# Patient Record
Sex: Male | Born: 1950
Health system: Southern US, Community
[De-identification: ages and names within clinical notes are randomized; demographics above are authoritative.]

## PROBLEM LIST (undated history)

## (undated) DIAGNOSIS — K219 Gastro-esophageal reflux disease without esophagitis: Secondary | ICD-10-CM

## (undated) DIAGNOSIS — Z973 Presence of spectacles and contact lenses: Secondary | ICD-10-CM

## (undated) DIAGNOSIS — I10 Essential (primary) hypertension: Secondary | ICD-10-CM

## (undated) DIAGNOSIS — Z460 Encounter for fitting and adjustment of spectacles and contact lenses: Secondary | ICD-10-CM

## (undated) DIAGNOSIS — E785 Hyperlipidemia, unspecified: Secondary | ICD-10-CM

## (undated) DIAGNOSIS — Z8719 Personal history of other diseases of the digestive system: Secondary | ICD-10-CM

## (undated) DIAGNOSIS — E669 Obesity, unspecified: Secondary | ICD-10-CM

## (undated) DIAGNOSIS — I471 Supraventricular tachycardia, unspecified: Secondary | ICD-10-CM

## (undated) DIAGNOSIS — C8190 Hodgkin lymphoma, unspecified, unspecified site: Secondary | ICD-10-CM

## (undated) DIAGNOSIS — I499 Cardiac arrhythmia, unspecified: Secondary | ICD-10-CM

## (undated) DIAGNOSIS — J189 Pneumonia, unspecified organism: Secondary | ICD-10-CM

## (undated) DIAGNOSIS — M1990 Unspecified osteoarthritis, unspecified site: Secondary | ICD-10-CM

## (undated) DIAGNOSIS — Z9221 Personal history of antineoplastic chemotherapy: Secondary | ICD-10-CM

## (undated) HISTORY — DX: Personal history of antineoplastic chemotherapy: Z92.21

## (undated) HISTORY — DX: Essential (primary) hypertension: I10

## (undated) HISTORY — PX: WISDOM TOOTH EXTRACTION: SHX21

## (undated) HISTORY — DX: Hyperlipidemia, unspecified: E78.5

## (undated) HISTORY — PX: PORT-A-CATH REMOVAL: SHX5289

## (undated) HISTORY — DX: Encounter for fitting and adjustment of spectacles and contact lenses: Z46.0

## (undated) HISTORY — PX: CARDIAC CATHETERIZATION: SHX172

## (undated) HISTORY — DX: Presence of spectacles and contact lenses: Z97.3

## (undated) HISTORY — DX: Obesity, unspecified: E66.9

## (undated) HISTORY — DX: Gastro-esophageal reflux disease without esophagitis: K21.9

---

## 2004-01-16 DIAGNOSIS — I10 Essential (primary) hypertension: Secondary | ICD-10-CM

## 2004-01-16 HISTORY — DX: Essential (primary) hypertension: I10

## 2014-03-08 ENCOUNTER — Ambulatory Visit (INDEPENDENT_AMBULATORY_CARE_PROVIDER_SITE_OTHER): Payer: Self-pay | Admitting: Medical

## 2014-03-08 ENCOUNTER — Encounter: Payer: Self-pay | Admitting: Medical

## 2014-03-08 VITALS — BP 150/80 | HR 67 | Temp 98.0°F | Resp 14 | Ht 71.0 in | Wt 263.0 lb

## 2014-03-08 DIAGNOSIS — E669 Obesity, unspecified: Secondary | ICD-10-CM

## 2014-03-08 DIAGNOSIS — I10 Essential (primary) hypertension: Secondary | ICD-10-CM | POA: Insufficient documentation

## 2014-03-08 MED ORDER — VERAPAMIL HCL 180 MG (CO) PO TB24: 180.0000 mg | ORAL_TABLET | Freq: Every day | ORAL | Status: DC

## 2014-03-08 MED ORDER — ENALAPRIL MALEATE 20 MG PO TABS: 20.0000 mg | ORAL_TABLET | Freq: Every day | ORAL | Status: DC

## 2014-03-08 NOTE — Progress Notes (Signed)
  Subjective:  Bradley Novak is a 64 y.o. male who presents as a new patient to establish care.   I recently started seeing his wife as well as they had difficulty finding a doctor who would accept them having no insurance.  Hx/o HTN x 10+ years, been on current medications enalapril and verapamil for 10 years.  Last stress test 20+ years ago.   Overall been in usual state of health, he states that he is active, dabbles in a lot of different things, physically active, denies any current problems no chest pain no swelling no shortness of breath no fatigue. He uses no diet discretion other than avoiding salt.  No other aggravating or relieving factors.    No other c/o.  The following portions of the patient's history were reviewed and updated as appropriate: allergies, current medications, past family history, past medical history, past social history, past surgical history and problem list.  ROS Otherwise as in subjective above  Objective: Physical Exam BP 150/80 mmHg  Pulse 67  Temp(Src) 98 F (36.7 C) (Oral)  Resp 14  Ht 5\' 11"  (1.803 m)  Wt 263 lb (119.296 kg)  BMI 36.70 kg/m2  General appearance: alert, no distress, WD/WN Neck: supple, no lymphadenopathy, no thyromegaly, no masses Heart: RRR, normal S1, S2, no murmurs Lungs: CTA bilaterally, no wheezes, rhonchi, or rales Abdomen: +bs, soft, non tender, non distended, no masses, no hepatomegaly, no splenomegaly Pulses: 2+ radial pulses, 2+ pedal pulses, normal cap refill Ext: no edema   Assessment: Encounter Diagnoses  Name Primary?  . Essential hypertension Yes  . Obesity     Plan: HTN, obesity - c/t current medications, strongly encouraged him to lose weight, watch diet carefully.  currently has not been using diet discretion.  C/t getting physical activity.  Refilled medications.  Follow up: 25mo, fasting

## 2014-06-08 ENCOUNTER — Other Ambulatory Visit: Payer: Self-pay | Admitting: Medical

## 2014-07-07 ENCOUNTER — Ambulatory Visit: Payer: Self-pay | Admitting: Medical

## 2014-09-13 ENCOUNTER — Other Ambulatory Visit: Payer: Self-pay | Admitting: Medical

## 2014-09-27 ENCOUNTER — Encounter: Payer: Self-pay | Admitting: Medical

## 2014-09-27 ENCOUNTER — Ambulatory Visit (INDEPENDENT_AMBULATORY_CARE_PROVIDER_SITE_OTHER): Payer: Self-pay | Admitting: Medical

## 2014-09-27 VITALS — BP 152/88 | HR 58 | Temp 98.2°F | Resp 18 | Wt 260.2 lb

## 2014-09-27 DIAGNOSIS — E669 Obesity, unspecified: Secondary | ICD-10-CM

## 2014-09-27 DIAGNOSIS — I10 Essential (primary) hypertension: Secondary | ICD-10-CM

## 2014-09-27 LAB — COMPREHENSIVE METABOLIC PANEL
ALK PHOS: 56 U/L (ref 40–115)
ALT: 14 U/L (ref 9–46)
AST: 13 U/L (ref 10–35)
Albumin: 3.9 g/dL (ref 3.6–5.1)
BUN: 18 mg/dL (ref 7–25)
CHLORIDE: 103 mmol/L (ref 98–110)
CO2: 25 mmol/L (ref 20–31)
Calcium: 9.1 mg/dL (ref 8.6–10.3)
Creat: 0.83 mg/dL (ref 0.70–1.25)
GLUCOSE: 91 mg/dL (ref 65–99)
POTASSIUM: 4 mmol/L (ref 3.5–5.3)
Sodium: 137 mmol/L (ref 135–146)
Total Bilirubin: 0.5 mg/dL (ref 0.2–1.2)
Total Protein: 6.8 g/dL (ref 6.1–8.1)

## 2014-09-27 LAB — CBC
HEMATOCRIT: 41.4 % (ref 39.0–52.0)
HEMOGLOBIN: 13.9 g/dL (ref 13.0–17.0)
MCH: 28.8 pg (ref 26.0–34.0)
MCHC: 33.6 g/dL (ref 30.0–36.0)
MCV: 85.9 fL (ref 78.0–100.0)
MPV: 10.5 fL (ref 8.6–12.4)
Platelets: 242 10*3/uL (ref 150–400)
RBC: 4.82 MIL/uL (ref 4.22–5.81)
RDW: 13.7 % (ref 11.5–15.5)
WBC: 8.1 10*3/uL (ref 4.0–10.5)

## 2014-09-27 LAB — LIPID PANEL
Cholesterol: 172 mg/dL (ref 125–200)
HDL: 34 mg/dL — ABNORMAL LOW (ref >=40)
LDL CALC: 101 mg/dL (ref <130)
Total CHOL/HDL Ratio: 5.1 Ratio — ABNORMAL HIGH (ref <=5.0)
Triglycerides: 187 mg/dL — ABNORMAL HIGH (ref <150)
VLDL: 37 mg/dL — ABNORMAL HIGH (ref <30)

## 2014-09-27 NOTE — Progress Notes (Signed)
  Subjective: Chief Complaint  Patient presents with  . Follow-up    Bradley Novak is a 64 y.o. male who presents for med check.  Last and first visit to establish here was 02/2014.   I see his wife as a patient as well.  Hypertension - Here for follow-up of hypertension.  He is exercising with daily activity around the house,   He is adherent to a low-salt diet.  Blood pressure is well controlled at home.  SBP usually ranges 130s.   DBP usually ranges 80s.  Cardiac symptoms: none. Patient denies: chest pain, claudication, dyspnea, exertional chest pressure/discomfort, fatigue and lower extremity edema. Cardiovascular risk factors: advanced age (older than 73 for men, 62 for women), hypertension, male gender and obesity (BMI >= 30 kg/m2). Use of agents associated with hypertension: none. History of target organ damage: none.  No other c/o.  The following portions of the patient's history were reviewed and updated as appropriate: allergies, current medications, past family history, past medical history, past social history, past surgical history and problem list.  ROS Otherwise as in subjective above   Objective: BP 152/88 mmHg  Pulse 58  Temp(Src) 98.2 F (36.8 C) (Oral)  Resp 18  Wt 260 lb 3.2 oz (118.026 kg)  Gen: wd, wn, obese white male, nad Neck : supple,nontender, no mass, no thyromegaly Heart RRR, normal s1, s2, no murmurs Lungs clear Ext no edema Pulses normal Neuro: nonfocal exam    Assessment: Encounter Diagnoses  Name Primary?  . Essential hypertension Yes  . Obesity      Plan: Discussed need for healthy diet, routine exercise, weight loss, and we will make medication changes once labs are back.  BP not at goal.  Bradley Novak was seen today for follow-up.  Diagnoses and all orders for this visit:  Essential hypertension -     Comprehensive metabolic panel -     CBC -     Lipid panel -     Hemoglobin A1c  Obesity -     Comprehensive metabolic panel -      CBC -     Lipid panel -     Hemoglobin A1c

## 2014-09-28 ENCOUNTER — Other Ambulatory Visit: Payer: Self-pay | Admitting: Medical

## 2014-09-28 LAB — HEMOGLOBIN A1C
HEMOGLOBIN A1C: 5.9 % — AB (ref <5.7)
MEAN PLASMA GLUCOSE: 123 mg/dL — AB (ref <117)

## 2014-09-28 MED ORDER — HYDROCHLOROTHIAZIDE 25 MG PO TABS: 25.0000 mg | ORAL_TABLET | Freq: Every day | ORAL | Status: DC

## 2014-09-28 MED ORDER — ATORVASTATIN CALCIUM 20 MG PO TABS: 20.0000 mg | ORAL_TABLET | Freq: Every day | ORAL | Status: DC

## 2014-09-28 MED ORDER — VERAPAMIL HCL 180 MG (CO) PO TB24: 180.0000 mg | ORAL_TABLET | Freq: Every day | ORAL | Status: DC

## 2015-02-14 ENCOUNTER — Telehealth: Payer: Self-pay

## 2015-02-14 MED ORDER — VERAPAMIL HCL ER 180 MG PO TBCR: 180.0000 mg | EXTENDED_RELEASE_TABLET | Freq: Every day | ORAL | Status: DC

## 2015-02-14 MED ORDER — HYDROCHLOROTHIAZIDE 25 MG PO TABS: 25.0000 mg | ORAL_TABLET | Freq: Every day | ORAL | Status: DC

## 2015-02-14 MED ORDER — ATORVASTATIN CALCIUM 20 MG PO TABS: 20.0000 mg | ORAL_TABLET | Freq: Every day | ORAL | Status: DC

## 2015-02-14 NOTE — Telephone Encounter (Signed)
Refilled for 90 days per wifes pt message but pt is aware that he needs a f/up appt.

## 2015-04-04 ENCOUNTER — Telehealth: Payer: Self-pay | Admitting: Medical

## 2015-04-04 MED ORDER — ATORVASTATIN CALCIUM 20 MG PO TABS: 20.0000 mg | ORAL_TABLET | Freq: Every day | ORAL | Status: DC

## 2015-04-04 MED ORDER — HYDROCHLOROTHIAZIDE 25 MG PO TABS: 25.0000 mg | ORAL_TABLET | Freq: Every day | ORAL | Status: DC

## 2015-04-04 NOTE — Telephone Encounter (Signed)
Refilled and left message for pt to schedule appt

## 2015-04-04 NOTE — Telephone Encounter (Signed)
Rcvd refill request for Atorvastatin 20mg  #90 and Hydochlorothiazide 25mg  #90

## 2015-08-08 ENCOUNTER — Telehealth: Payer: Self-pay

## 2015-08-08 NOTE — Telephone Encounter (Signed)
Called pt to set up appt and had to leave message. No refills until he has an appt

## 2015-08-08 NOTE — Telephone Encounter (Signed)
Pt needs refills of HCTZ, verapamil 180mg  and atorvastatin sent to [facility] for 90 day supply please. Victorino December

## 2015-12-16 ENCOUNTER — Other Ambulatory Visit: Payer: Self-pay | Admitting: Medical

## 2015-12-19 ENCOUNTER — Other Ambulatory Visit: Payer: Self-pay | Admitting: Medical

## 2015-12-19 ENCOUNTER — Telehealth: Payer: Self-pay | Admitting: Medical

## 2015-12-19 NOTE — Telephone Encounter (Signed)
Refill denied. Needs OV for further refills.  Attempted to call pt- LM that he needs appt.  Also mailed letter. Victorino December

## 2015-12-19 NOTE — Telephone Encounter (Signed)
Rcvd refill request from [facility]  For Verapamil HCL ER 180mg  #90

## 2016-01-20 ENCOUNTER — Encounter: Payer: Self-pay | Admitting: Medical

## 2016-01-20 ENCOUNTER — Other Ambulatory Visit: Payer: Self-pay | Admitting: Medical

## 2016-01-20 ENCOUNTER — Ambulatory Visit (INDEPENDENT_AMBULATORY_CARE_PROVIDER_SITE_OTHER): Payer: Self-pay | Admitting: Medical

## 2016-01-20 VITALS — BP 200/110 | HR 53 | Wt 246.0 lb

## 2016-01-20 DIAGNOSIS — I10 Essential (primary) hypertension: Secondary | ICD-10-CM

## 2016-01-20 DIAGNOSIS — E785 Hyperlipidemia, unspecified: Secondary | ICD-10-CM

## 2016-01-20 MED ORDER — ATORVASTATIN CALCIUM 20 MG PO TABS: ORAL_TABLET | ORAL | 0 refills | Status: DC

## 2016-01-20 NOTE — Progress Notes (Signed)
Subjective: Chief Complaint  Patient presents with  . med check    med check, no other concerns    Here for med check.  Been busy, so in last month or so hasn't been taking medications.  Ran out of medications a  Month ago.  Last visit 09/2014.  He is self pay, no insurance.  He reports that he is only using the verapamil and enalapril prior to a month ago.  Never really used HCTZ since last visit.   He was taking the statin then quit for a period of time.  Uses no particular diet discretion.  Gets some exercise with walking.  Overall, no chest pain, SOB, dizziness, edema, polyuria, polydipsia.  No other aggravating or relieving factors. No other complaint.  Past Medical History:  Diagnosis Date  . Hypertension 2006  . Obesity   . Wears glasses    Current Outpatient Prescriptions on File Prior to Visit  Medication Sig Dispense Refill  . enalapril (VASOTEC) 20 MG tablet Take 1 tablet (20 mg total) by mouth daily. 90 tablet 1  . hydrochlorothiazide (HYDRODIURIL) 25 MG tablet Take 1 tablet (25 mg total) by mouth daily. 90 tablet 0  . verapamil (CALAN-SR) 180 MG CR tablet Take 1 tablet (180 mg total) by mouth at bedtime. 90 tablet 0   No current facility-administered medications on file prior to visit.    Family History  Problem Relation Age of Onset  . Hypertension Father   . Hypertension Brother   . Hypertension Brother   . Hypertension Brother   . Hypertension Brother    ROS as in subjective    Objective: BP (!) 200/110   Pulse (!) 53   Wt 246 lb (111.6 kg)   SpO2 97%   BMI 34.31 kg/m   BP Readings from Last 3 Encounters:  01/20/16 (!) 200/110  09/27/14 (!) 152/88  03/08/14 (!) 150/80   Wt Readings from Last 3 Encounters:  01/20/16 246 lb (111.6 kg)  09/27/14 260 lb 3.2 oz (118 kg)  03/08/14 263 lb (119.3 kg)   General appearance: alert, no distress, WD/WN, obese white male Neck: supple, no lymphadenopathy, no thyromegaly, no masses Heart: RRR, normal S1, S2, no  murmurs Lungs: CTA bilaterally, no wheezes, rhonchi, or rales Abdomen: +bs, soft, non tender, non distended, no masses, no hepatomegaly, no splenomegaly Pulses: 2+ symmetric, upper and lower extremities, normal cap refill Ext: no edema    Assessment: Encounter Diagnoses  Name Primary?  . Essential hypertension Yes  . Hyperlipidemia, unspecified hyperlipidemia type   . Morbid obesity ([place])      Plan: HTN - there is a discrepancy.  My personal readings were 200/110 each arm.  My CMA got normal readings.  He swears he is getting normal home readings but also notes his cuff varies widely from each time he checks.  He is not convinced he has hypertension.   I advised he take the next 2 weeks to get many readings both with his cuff and cuff at pharmacy, log the results and get me readings within  2 weeks.  We can then decide on next steps.  I will be shocked if we don't get high readings.   I have good reason to believe he has hypertension and his whole family has hypertension, so he has risk factors including age, family hx/o , obesity, no diet discretion.   advised healthy diet, routine exercise, weight loss.  F/u 2wk  Hyperlipemia - discussed reasoning for statin to lower risk  of heart attack, stroke.    obesity - c/t efforts at weight loss  He declines EKG today.  We will plan for recheck and labs in 6wk, but he will get me BP readings in 2 weeks.   Bradley Novak was seen today for med check.  Diagnoses and all orders for this visit:  Essential hypertension  Hyperlipidemia, unspecified hyperlipidemia type  Morbid obesity ([place])  Other orders -     atorvastatin (LIPITOR) 20 MG tablet; TAKE 1 TABLET (20MG  TOTAL) BY MOUTH DAILY

## 2016-01-23 MED ORDER — ATORVASTATIN CALCIUM 20 MG PO TABS: ORAL_TABLET | ORAL | 0 refills | Status: DC

## 2016-01-23 MED ORDER — VERAPAMIL HCL ER 180 MG PO TBCR: 180.0000 mg | EXTENDED_RELEASE_TABLET | Freq: Every day | ORAL | 0 refills | Status: DC

## 2016-01-23 NOTE — Telephone Encounter (Signed)
done

## 2016-01-23 NOTE — Addendum Note (Signed)
Addended by: Minette Headland A on: 01/23/2016 08:32 AM   Modules accepted: Orders

## 2016-01-24 ENCOUNTER — Other Ambulatory Visit: Payer: Self-pay | Admitting: Medical

## 2016-01-24 MED ORDER — ENALAPRIL MALEATE 20 MG PO TABS: 20.0000 mg | ORAL_TABLET | Freq: Every day | ORAL | 0 refills | Status: DC

## 2016-01-24 MED ORDER — VERAPAMIL HCL ER 180 MG PO TBCR: 180.0000 mg | EXTENDED_RELEASE_TABLET | Freq: Every day | ORAL | 3 refills | Status: DC

## 2016-01-24 MED ORDER — ATORVASTATIN CALCIUM 20 MG PO TABS: ORAL_TABLET | ORAL | 3 refills | Status: DC

## 2016-02-02 ENCOUNTER — Other Ambulatory Visit: Payer: Self-pay | Admitting: Medical

## 2016-02-02 MED ORDER — VERAPAMIL HCL ER 180 MG PO TBCR: EXTENDED_RELEASE_TABLET | ORAL | 1 refills | Status: DC

## 2016-04-29 ENCOUNTER — Encounter: Payer: Self-pay | Admitting: Medical

## 2016-04-30 ENCOUNTER — Other Ambulatory Visit: Payer: Self-pay | Admitting: Medical

## 2016-04-30 MED ORDER — VERAPAMIL HCL ER 180 MG PO TBCR: EXTENDED_RELEASE_TABLET | ORAL | 1 refills | Status: DC

## 2016-04-30 MED ORDER — ENALAPRIL MALEATE 20 MG PO TABS: 20.0000 mg | ORAL_TABLET | Freq: Every day | ORAL | 1 refills | Status: DC

## 2016-09-20 ENCOUNTER — Other Ambulatory Visit: Payer: Self-pay | Admitting: Medical

## 2016-09-20 NOTE — Telephone Encounter (Signed)
Called and l/m for pt call us back to make an appt for refill on meds.

## 2016-09-24 ENCOUNTER — Telehealth: Payer: Self-pay | Admitting: Family Medicine

## 2016-09-24 ENCOUNTER — Other Ambulatory Visit: Payer: Self-pay | Admitting: Medical

## 2016-09-24 MED ORDER — VERAPAMIL HCL ER 180 MG PO TBCR: EXTENDED_RELEASE_TABLET | ORAL | 0 refills | Status: DC

## 2016-09-24 NOTE — Telephone Encounter (Signed)
Called both numbers, one was not able to leave a voice mail the other one just rang and rang

## 2016-09-24 NOTE — Telephone Encounter (Signed)
Try again tomorrow

## 2016-09-24 NOTE — Telephone Encounter (Signed)
[facility] req Verapamil HCL ER 180 mg  180 tab

## 2016-09-24 NOTE — Telephone Encounter (Signed)
I sent med.  Get him in for fasting f/u.

## 2016-09-25 NOTE — Telephone Encounter (Signed)
Pt will call back when he can schedule something he is working a lot right now

## 2016-09-26 ENCOUNTER — Other Ambulatory Visit: Payer: Self-pay | Admitting: Medical

## 2016-10-01 ENCOUNTER — Other Ambulatory Visit: Payer: Self-pay | Admitting: Medical

## 2016-10-01 NOTE — Telephone Encounter (Signed)
Called pt could not l/m his voicemail was full

## 2017-02-25 ENCOUNTER — Ambulatory Visit (INDEPENDENT_AMBULATORY_CARE_PROVIDER_SITE_OTHER): Payer: Self-pay | Admitting: Family Medicine

## 2017-02-25 ENCOUNTER — Encounter: Payer: Self-pay | Admitting: Family Medicine

## 2017-02-25 VITALS — BP 130/80 | HR 68 | Temp 98.3°F | Resp 16 | Wt 257.8 lb

## 2017-02-25 DIAGNOSIS — J069 Acute upper respiratory infection, unspecified: Secondary | ICD-10-CM

## 2017-02-25 NOTE — Patient Instructions (Signed)
I suspect your symptoms are related to a viral illness and recommend treating your symptoms at this point.  Mucinex DM for congestion and cough or a multi-system cold medication, drink extra water, use salt water gargles for throat irritation and Tylenol or Ibuprofen for aches and pains.  Call if you are not improving by days 7-10 of your illness or if you develop fever, wheezing or worsening symptoms.

## 2017-02-25 NOTE — Progress Notes (Signed)
Chief Complaint  Patient presents with  . sick    sick-, running nose,sinus pressure, headache. tried otc meds  been going on for a couple days.    Subjective:  Bradley Novak is a 67 y.o. male who presents for a 4 day history of rhinorrhea, nasal congestion, sneezing, dry cough that is worse at night. Decreased energy. His wife has been sick for the past 3 weeks with a sinus infection.   Denies fever, chills, body aches, ear pain, chest pain, palpitations, shortness of breath, wheezing, abdominal pain, N/V/D.   Treatment to date: none.  Positive sick contacts.  No other aggravating or relieving factors.  No other c/o.  ROS as in subjective.   Objective: Vitals:   02/25/17 1327  BP: 130/80  Pulse: 68  Resp: 16  Temp: 98.3 F (36.8 C)  SpO2: 98%    General appearance: Alert, WD/WN, no distress, mildly ill appearing                             Skin: warm, no rash                           Head: no sinus tenderness                            Eyes: conjunctiva normal, corneas clear, PERRLA                            Ears: pearly TMs, external ear canals normal                          Nose: septum midline, turbinates swollen, with erythema and clear discharge             Mouth/throat: MMM, tongue normal, mild pharyngeal erythema                           Neck: supple, no adenopathy, no thyromegaly, nontender                          Heart: RRR, normal S1, S2, no murmurs                         Lungs: CTA bilaterally, no wheezes, rales, or rhonchi      Assessment: Acute URI   Plan: Discussed diagnosis and treatment of URI. Most likely etiology is viral illness. Suggested symptomatic OTC remedies. Salt water gargles, hydrate, rest.  Nasal saline spray for congestion.  Tylenol or Ibuprofen OTC for fever and malaise.  Call/return in 2-3 days if symptoms aren't resolving.

## 2017-02-28 ENCOUNTER — Telehealth: Payer: Self-pay | Admitting: Medical

## 2017-02-28 MED ORDER — AMOXICILLIN-POT CLAVULANATE 875-125 MG PO TABS: 1.0000 | ORAL_TABLET | Freq: Two times a day (BID) | ORAL | 0 refills | Status: DC

## 2017-02-28 NOTE — Telephone Encounter (Signed)
Pt said he was asked by Vickie to call if he did not get better. He left a voice message stating that he and his wife was seen by Loletha Carrow on the same day and pt is not better at all. His wife received Augmentin and she is better. Pt would like to get the same med that his wife received if possible. Send to [facility] on [address]

## 2017-02-28 NOTE — Telephone Encounter (Signed)
Please send in Augmenting 875 bid x 10 days as long as he does not have an allergy to this.

## 2017-02-28 NOTE — Telephone Encounter (Signed)
done

## 2017-04-11 DIAGNOSIS — M170 Bilateral primary osteoarthritis of knee: Secondary | ICD-10-CM | POA: Diagnosis not present

## 2017-04-11 DIAGNOSIS — M545 Low back pain, unspecified: Secondary | ICD-10-CM | POA: Insufficient documentation

## 2017-04-11 DIAGNOSIS — M25562 Pain in left knee: Secondary | ICD-10-CM

## 2017-04-11 DIAGNOSIS — M1712 Unilateral primary osteoarthritis, left knee: Secondary | ICD-10-CM | POA: Diagnosis not present

## 2017-04-11 DIAGNOSIS — M25551 Pain in right hip: Secondary | ICD-10-CM | POA: Diagnosis not present

## 2017-04-11 DIAGNOSIS — M1711 Unilateral primary osteoarthritis, right knee: Secondary | ICD-10-CM | POA: Diagnosis not present

## 2017-04-11 DIAGNOSIS — M25552 Pain in left hip: Secondary | ICD-10-CM

## 2017-04-11 DIAGNOSIS — M25561 Pain in right knee: Secondary | ICD-10-CM | POA: Insufficient documentation

## 2017-04-19 DIAGNOSIS — M25561 Pain in right knee: Secondary | ICD-10-CM | POA: Diagnosis not present

## 2017-05-24 DIAGNOSIS — M25571 Pain in right ankle and joints of right foot: Secondary | ICD-10-CM | POA: Diagnosis not present

## 2017-05-24 DIAGNOSIS — M722 Plantar fascial fibromatosis: Secondary | ICD-10-CM | POA: Diagnosis not present

## 2017-05-24 DIAGNOSIS — M79671 Pain in right foot: Secondary | ICD-10-CM | POA: Diagnosis not present

## 2017-05-24 DIAGNOSIS — M25572 Pain in left ankle and joints of left foot: Secondary | ICD-10-CM | POA: Diagnosis not present

## 2017-07-17 DIAGNOSIS — M25561 Pain in right knee: Secondary | ICD-10-CM | POA: Diagnosis not present

## 2017-07-17 DIAGNOSIS — M25562 Pain in left knee: Secondary | ICD-10-CM | POA: Diagnosis not present

## 2017-08-29 ENCOUNTER — Ambulatory Visit
Admission: RE | Admit: 2017-08-29 | Discharge: 2017-08-29 | Disposition: A | Discharge status: Home / Self Care | Payer: Medicare Other | Source: Ambulatory Visit | Attending: Medical | Admitting: Medical

## 2017-08-29 ENCOUNTER — Ambulatory Visit (INDEPENDENT_AMBULATORY_CARE_PROVIDER_SITE_OTHER): Payer: Medicare Other | Admitting: Medical

## 2017-08-29 ENCOUNTER — Encounter: Payer: Self-pay | Admitting: Medical

## 2017-08-29 VITALS — BP 160/88 | HR 58 | Temp 98.1°F | Ht 71.0 in | Wt 253.2 lb

## 2017-08-29 DIAGNOSIS — R05 Cough: Secondary | ICD-10-CM

## 2017-08-29 DIAGNOSIS — Z125 Encounter for screening for malignant neoplasm of prostate: Secondary | ICD-10-CM | POA: Diagnosis not present

## 2017-08-29 DIAGNOSIS — R490 Dysphonia: Secondary | ICD-10-CM | POA: Insufficient documentation

## 2017-08-29 DIAGNOSIS — Z1211 Encounter for screening for malignant neoplasm of colon: Secondary | ICD-10-CM

## 2017-08-29 DIAGNOSIS — Z131 Encounter for screening for diabetes mellitus: Secondary | ICD-10-CM

## 2017-08-29 DIAGNOSIS — R059 Cough, unspecified: Secondary | ICD-10-CM

## 2017-08-29 DIAGNOSIS — Z23 Encounter for immunization: Secondary | ICD-10-CM

## 2017-08-29 DIAGNOSIS — C8190 Hodgkin lymphoma, unspecified, unspecified site: Secondary | ICD-10-CM | POA: Insufficient documentation

## 2017-08-29 DIAGNOSIS — R591 Generalized enlarged lymph nodes: Secondary | ICD-10-CM | POA: Diagnosis not present

## 2017-08-29 DIAGNOSIS — I10 Essential (primary) hypertension: Secondary | ICD-10-CM | POA: Diagnosis not present

## 2017-08-29 LAB — POCT URINALYSIS DIP (PROADVANTAGE DEVICE)
BILIRUBIN UA: NEGATIVE
Blood, UA: NEGATIVE
Glucose, UA: NEGATIVE mg/dL
Ketones, POC UA: NEGATIVE mg/dL
Leukocytes, UA: NEGATIVE
Nitrite, UA: NEGATIVE
Protein Ur, POC: NEGATIVE mg/dL
pH, UA: 6 (ref 5.0–8.0)

## 2017-08-29 NOTE — Addendum Note (Signed)
Addended by: Edgar Frisk on: 08/29/2017 01:57 PM   Modules accepted: Orders

## 2017-08-29 NOTE — Progress Notes (Addendum)
Subjective: Chief Complaint  Patient presents with  . Lymphadenopathy    x1 month with sore throat    He notes sore throat about month ago, few days after sore throat felt lymph node over collar bone on right.   He was started to have some throat clearing, mucous.   The lymph node persisted until last few days feels new swollen lymph nodes in neck.  Denies fever, weight loss, blood in stool, change in appetite, nausea vomiting or diarrhea.  He does have some cough and some little bit of hoarseness in the throat.  HTN - compliant with medication but not checking BPs at home.    He is past due for routine follow-up on medications and labs  He has not had a colonoscopy yet, has put this off due to financial reasons up to now.  Former smoker late teens, early 53s.  Sees orthopedics for knee issues.  Past Medical History:  Diagnosis Date  . Hypertension 2006  . Obesity   . Wears glasses    Current Outpatient Medications on File Prior to Visit  Medication Sig Dispense Refill  . ASPIRIN 81 PO aspirin    . atorvastatin (LIPITOR) 20 MG tablet TAKE 1 TABLET (20MG  TOTAL) BY MOUTH DAILY 90 tablet 3  . enalapril (VASOTEC) 20 MG tablet Take 1 tablet (20 mg total) by mouth daily. 90 tablet 1  . verapamil (CALAN-SR) 180 MG CR tablet TAKE 2 TABLETS BY MOUTH DAILY 30 tablet 0   No current facility-administered medications on file prior to visit.    ROS as in subjective    Objective: BP (!) 160/88   Pulse (!) 58   Temp 98.1 F (36.7 C) (Oral)   Ht 5\' 11"  (1.803 m)   Wt 253 lb 3.2 oz (114.9 kg)   SpO2 96%   BMI 35.31 kg/m   Wt Readings from Last 3 Encounters:  08/29/17 253 lb 3.2 oz (114.9 kg)  02/25/17 257 lb 12.8 oz (116.9 kg)  01/20/16 246 lb (111.6 kg)   BP Readings from Last 3 Encounters:  08/29/17 (!) 160/88  02/25/17 130/80  01/20/16 (!) 200/110   General appearance: alert, no distress, WD/WN, obese white male scattered macules, scattered small cherry hemangiomas of  torso anterior and posterior HEENT: normocephalic, sclerae anicteric, TMs pearly, nares patent, no discharge or erythema, pharynx with mild erytehma, right tonsillary region possible small polyp Oral cavity: MMM, no lesions Neck: supple, several enlarged nontender swollen lymph nodes right anterior, right submandibular and right supraclavicular region, largest 3+ cm diameter, no thyromegaly, no masses Heart: RRR, normal S1, S2, no murmurs Lungs: CTA bilaterally, no wheezes, rhonchi, or rales Abdomen: +bs, soft, non tender, non distended, no masses, no hepatomegaly, no splenomegaly Pulses: 2+ symmetric, upper and lower extremities, normal cap refill Ext: no edema Neuro: nonfocal exam      Assessment: Encounter Diagnoses  Name Primary?  . Lymphadenopathy Yes  . Essential hypertension   . Morbid obesity ([place])   . Hoarseness   . Cough   . Screening for prostate cancer   . Screening for diabetes mellitus   . Screen for colon cancer      Plan Lymphadenopathy- he has abnormally enlarged lymph nodes in his right neck and supraclavicular area.  We discussed possible differential.  Labs today, chest x-ray today.  Cough, hoarseness same plan as lymphadenopathy  Hypertension- pending labs will likely make some medication changes  Obesity -lab screening today  PSA prostate screen today  Pending labs, he  requests Cologuard instead of Colonoscopy  Counseled on the influenza virus vaccine.  Vaccine information sheet given.  Influenza vaccine given after consent obtained.   Koven was seen today for lymphadenopathy.  Diagnoses and all orders for this visit:  Lymphadenopathy -     Comprehensive metabolic panel -     CBC with Differential/Platelet -     TSH -     Hemoglobin A1c -     DG Chest 2 View; Future  Essential hypertension -     Comprehensive metabolic panel -     CBC with Differential/Platelet -     Lipid panel -     Hemoglobin A1c -     Microalbumin / creatinine  urine ratio  Morbid obesity ([facility]) -     Hemoglobin A1c  Hoarseness -     CBC with Differential/Platelet -     DG Chest 2 View; Future  Cough -     CBC with Differential/Platelet -     DG Chest 2 View; Future  Screening for prostate cancer -     PSA  Screening for diabetes mellitus  Screen for colon cancer

## 2017-08-30 ENCOUNTER — Other Ambulatory Visit: Payer: Self-pay | Admitting: Medical

## 2017-08-30 LAB — CBC WITH DIFFERENTIAL/PLATELET
BASOS: 0 %
Basophils Absolute: 0 10*3/uL (ref 0.0–0.2)
EOS (ABSOLUTE): 0.1 10*3/uL (ref 0.0–0.4)
Eos: 1 %
HEMATOCRIT: 42 % (ref 37.5–51.0)
Hemoglobin: 14.5 g/dL (ref 13.0–17.7)
Immature Grans (Abs): 0 10*3/uL (ref 0.0–0.1)
Immature Granulocytes: 0 %
LYMPHS ABS: 1.8 10*3/uL (ref 0.7–3.1)
Lymphs: 22 %
MCH: 29.7 pg (ref 26.6–33.0)
MCHC: 34.5 g/dL (ref 31.5–35.7)
MCV: 86 fL (ref 79–97)
MONOS ABS: 0.5 10*3/uL (ref 0.1–0.9)
Monocytes: 7 %
NEUTROS ABS: 5.6 10*3/uL (ref 1.4–7.0)
Neutrophils: 70 %
Platelets: 237 10*3/uL (ref 150–450)
RBC: 4.89 x10E6/uL (ref 4.14–5.80)
RDW: 13.7 % (ref 12.3–15.4)
WBC: 8 10*3/uL (ref 3.4–10.8)

## 2017-08-30 LAB — COMPREHENSIVE METABOLIC PANEL
ALBUMIN: 4.1 g/dL (ref 3.6–4.8)
ALK PHOS: 124 IU/L — AB (ref 39–117)
ALT: 24 IU/L (ref 0–44)
AST: 19 IU/L (ref 0–40)
Albumin/Globulin Ratio: 1.2 (ref 1.2–2.2)
BUN / CREAT RATIO: 16 (ref 10–24)
BUN: 16 mg/dL (ref 8–27)
Bilirubin Total: 0.6 mg/dL (ref 0.0–1.2)
CO2: 24 mmol/L (ref 20–29)
Calcium: 9.2 mg/dL (ref 8.6–10.2)
Chloride: 103 mmol/L (ref 96–106)
Creatinine, Ser: 1.03 mg/dL (ref 0.76–1.27)
GFR calc non Af Amer: 76 mL/min/{1.73_m2} (ref >59)
GFR, EST AFRICAN AMERICAN: 88 mL/min/{1.73_m2} (ref >59)
GLOBULIN, TOTAL: 3.3 g/dL (ref 1.5–4.5)
Glucose: 93 mg/dL (ref 65–99)
Potassium: 4.3 mmol/L (ref 3.5–5.2)
SODIUM: 141 mmol/L (ref 134–144)
Total Protein: 7.4 g/dL (ref 6.0–8.5)

## 2017-08-30 LAB — LIPID PANEL
CHOL/HDL RATIO: 3 ratio (ref 0.0–5.0)
Cholesterol, Total: 131 mg/dL (ref 100–199)
HDL: 44 mg/dL (ref >39)
LDL Calculated: 70 mg/dL (ref 0–99)
Triglycerides: 85 mg/dL (ref 0–149)
VLDL Cholesterol Cal: 17 mg/dL (ref 5–40)

## 2017-08-30 LAB — PSA: PROSTATE SPECIFIC AG, SERUM: 2 ng/mL (ref 0.0–4.0)

## 2017-08-30 LAB — HEMOGLOBIN A1C
ESTIMATED AVERAGE GLUCOSE: 111 mg/dL
Hgb A1c MFr Bld: 5.5 % (ref 4.8–5.6)

## 2017-08-30 LAB — MICROALBUMIN / CREATININE URINE RATIO
CREATININE, UR: 129.9 mg/dL
Microalb/Creat Ratio: 5.9 mg/g creat (ref 0.0–30.0)
Microalbumin, Urine: 7.7 ug/mL

## 2017-08-30 LAB — TSH: TSH: 2.28 u[IU]/mL (ref 0.450–4.500)

## 2017-08-30 MED ORDER — AMOXICILLIN-POT CLAVULANATE 875-125 MG PO TABS: 1.0000 | ORAL_TABLET | Freq: Two times a day (BID) | ORAL | 0 refills | Status: AC

## 2017-08-30 MED ORDER — VERAPAMIL HCL ER 180 MG PO TBCR: 360.0000 mg | EXTENDED_RELEASE_TABLET | Freq: Every day | ORAL | 3 refills | Status: DC

## 2017-08-30 MED ORDER — CARVEDILOL 3.125 MG PO TABS: 3.1250 mg | ORAL_TABLET | Freq: Two times a day (BID) | ORAL | 2 refills | Status: DC

## 2017-08-30 MED ORDER — ATORVASTATIN CALCIUM 20 MG PO TABS: ORAL_TABLET | ORAL | 3 refills | Status: DC

## 2017-08-30 MED ORDER — ASPIRIN EC 81 MG PO TBEC: 81.0000 mg | DELAYED_RELEASE_TABLET | Freq: Every day | ORAL | 3 refills | Status: DC

## 2017-08-30 MED ORDER — ENALAPRIL MALEATE 20 MG PO TABS: 20.0000 mg | ORAL_TABLET | Freq: Every day | ORAL | 1 refills | Status: DC

## 2017-09-02 ENCOUNTER — Other Ambulatory Visit: Payer: Self-pay | Admitting: Medical

## 2017-09-06 ENCOUNTER — Ambulatory Visit (INDEPENDENT_AMBULATORY_CARE_PROVIDER_SITE_OTHER): Payer: Medicare Other | Admitting: Medical

## 2017-09-06 ENCOUNTER — Telehealth: Payer: Self-pay | Admitting: Medical

## 2017-09-06 ENCOUNTER — Encounter: Payer: Self-pay | Admitting: Medical

## 2017-09-06 VITALS — BP 140/90 | HR 52 | Temp 98.1°F | Resp 16 | Ht 72.0 in | Wt 255.4 lb

## 2017-09-06 DIAGNOSIS — R591 Generalized enlarged lymph nodes: Secondary | ICD-10-CM | POA: Diagnosis not present

## 2017-09-06 DIAGNOSIS — I10 Essential (primary) hypertension: Secondary | ICD-10-CM | POA: Diagnosis not present

## 2017-09-06 DIAGNOSIS — J189 Pneumonia, unspecified organism: Secondary | ICD-10-CM | POA: Insufficient documentation

## 2017-09-06 DIAGNOSIS — R5383 Other fatigue: Secondary | ICD-10-CM | POA: Diagnosis not present

## 2017-09-06 NOTE — Progress Notes (Signed)
Subjective: Chief Complaint  Patient presents with  . follow up HTN    follow up HTN still running high   Here for concerns and recheck.  I saw him last week for lymphadenopathy, cough, and he feels much better with some residual cough.  He has a few days left on the antibiotic.  Amazingly the lymph nodes have decreased in size significantly  His main concern today is his recent home elevated blood pressure readings as high as 200/110.  He has a BP cuff that he thinks is accurate but he did not bring it with him today.  He denies chest pain or difficulty breathing no swelling no palpitations.  He is compliant with his blood pressure medicines as well as a new carvedilol that we added last week.  He has felt fatigued in recent weeks.  Wants testing for Lyme disease.  He notes his energy feels that seem to be heat intolerance at this point he thinks he got bit by ticks several weeks ago  Having some mild erectile troubles at times  Past Medical History:  Diagnosis Date  . Hypertension 2006  . Obesity   . Wears glasses    Current Outpatient Medications on File Prior to Visit  Medication Sig Dispense Refill  . amoxicillin-clavulanate (AUGMENTIN) 875-125 MG tablet Take 1 tablet by mouth 2 (two) times daily for 10 days. 20 tablet 0  . aspirin EC 81 MG tablet Take 1 tablet (81 mg total) by mouth daily. 90 tablet 3  . atorvastatin (LIPITOR) 20 MG tablet TAKE 1 TABLET BY MOUTH DAILY 90 tablet 2  . carvedilol (COREG) 3.125 MG tablet Take 1 tablet (3.125 mg total) by mouth 2 (two) times daily. 60 tablet 2  . enalapril (VASOTEC) 20 MG tablet Take 1 tablet (20 mg total) by mouth daily. 90 tablet 1  . verapamil (CALAN-SR) 180 MG CR tablet Take 2 tablets (360 mg total) by mouth daily. 180 tablet 3   No current facility-administered medications on file prior to visit.    ROS as in subjective    Objective: BP 140/90   Pulse (!) 52   Temp 98.1 F (36.7 C) (Oral)   Resp 16   Ht 6' (1.829 m)    Wt 255 lb 6.4 oz (115.8 kg)   SpO2 96%   BMI 34.64 kg/m   General appearance: alert, no distress, WD/WN, obese white male scattered macules, scattered small cherry hemangiomas of torso anterior and posterior HEENT: normocephalic, sclerae anicteric, TMs pearly, nares patent, no discharge or erythema, pharynx without erythema today, right tonsillary region possible small polyp Oral cavity: MMM, no lesions Neck: supple, significant improvement from last week of several enlarged nontender swollen lymph nodes right anterior, right submandibular and right supraclavicular region, largest 1+ cm diameter now compared to last week, no thyromegaly, no masses Heart: RRR, normal S1, S2, no murmurs Lungs: CTA bilaterally, no wheezes, rhonchi, or rales Pulses: 2+ symmetric, upper and lower extremities, normal cap refill Ext: no edema Neuro: non focal exam   Adult ECG Report  Indication: HTN  Rate: 53 bpm  Rhythm: sinus bradycardia  QRS Axis: -34 degrees  PR Interval: 167ms  QRS Duration: 133ms  QTc: 453ms  Conduction Disturbances: left axis deviation, incomplete RBBB  Other Abnormalities: poor R wave progressoin  Patient's cardiac risk factors are: advanced age (older than 21 for men, 51 for women), hypertension and obesity (BMI >= 30 kg/m2).  EKG comparison: none  Narrative Interpretation: abnormal EKG  Assessment: Encounter Diagnoses  Name Primary?  . Essential hypertension Yes  . Lymphadenopathy   . Morbid obesity ([place])   . Fatigue, unspecified type   . Pneumonia due to infectious organism, unspecified laterality, unspecified part of lung      Plan: Hypertension, fatigue- reviewed EKG, continue enalapril 20 mg daily, verapamil 180 mg 2 tablets daily, and continue carvedilol just started last week.  Referral to cardiology.  Lymphadenopathy-improved significantly since last week.  Reviewed his labs and x-ray from last visit.  Finish antibiotic for pneumonia.  Plan to recheck in 3  to 4 weeks.  Fatigue-discussed differential diagnoses.  Additional labs today.  Pneumonia-plan to recheck x-ray in about a month   Truitt was seen today for follow up htn.  Diagnoses and all orders for this visit:  Essential hypertension -     EKG 12-Lead -     Testosterone -     Ambulatory referral to Cardiology -     EKG 12-Lead  Lymphadenopathy  Morbid obesity ([facility])  Fatigue, unspecified type -     Lyme Ab/Western Blot Reflex -     EKG 12-Lead -     Testosterone  Pneumonia due to infectious organism, unspecified laterality, unspecified part of lung

## 2017-09-06 NOTE — Telephone Encounter (Signed)
Done

## 2017-09-06 NOTE — Telephone Encounter (Signed)
Refer to Dr. Cathie Olden, cardiology for BP, heart evaluation

## 2017-09-09 LAB — LYME AB/WESTERN BLOT REFLEX
LYME DISEASE AB, QUANT, IGM: <0.8 index (ref 0.00–0.79)
Lyme IgG/IgM Ab: <0.91 {ISR} (ref 0.00–0.90)

## 2017-09-09 LAB — TESTOSTERONE: TESTOSTERONE: 503 ng/dL (ref 264–916)

## 2017-09-11 ENCOUNTER — Other Ambulatory Visit: Payer: Self-pay | Admitting: Medical

## 2017-09-11 MED ORDER — AMOXICILLIN-POT CLAVULANATE 875-125 MG PO TABS: 1.0000 | ORAL_TABLET | Freq: Two times a day (BID) | ORAL | 0 refills | Status: DC

## 2017-09-15 DIAGNOSIS — C8190 Hodgkin lymphoma, unspecified, unspecified site: Secondary | ICD-10-CM

## 2017-09-15 DIAGNOSIS — J189 Pneumonia, unspecified organism: Secondary | ICD-10-CM

## 2017-09-15 HISTORY — DX: Hodgkin lymphoma, unspecified, unspecified site: C81.90

## 2017-09-15 HISTORY — DX: Pneumonia, unspecified organism: J18.9

## 2017-09-19 ENCOUNTER — Ambulatory Visit (INDEPENDENT_AMBULATORY_CARE_PROVIDER_SITE_OTHER): Payer: Medicare Other | Admitting: Medical

## 2017-09-19 VITALS — BP 124/80 | HR 58 | Temp 98.8°F | Resp 16 | Ht 72.0 in | Wt 253.6 lb

## 2017-09-19 DIAGNOSIS — R05 Cough: Secondary | ICD-10-CM | POA: Diagnosis not present

## 2017-09-19 DIAGNOSIS — Z1211 Encounter for screening for malignant neoplasm of colon: Secondary | ICD-10-CM

## 2017-09-19 DIAGNOSIS — R5383 Other fatigue: Secondary | ICD-10-CM | POA: Diagnosis not present

## 2017-09-19 DIAGNOSIS — R591 Generalized enlarged lymph nodes: Secondary | ICD-10-CM

## 2017-09-19 DIAGNOSIS — J189 Pneumonia, unspecified organism: Secondary | ICD-10-CM

## 2017-09-19 DIAGNOSIS — R748 Abnormal levels of other serum enzymes: Secondary | ICD-10-CM | POA: Diagnosis not present

## 2017-09-19 DIAGNOSIS — R590 Localized enlarged lymph nodes: Secondary | ICD-10-CM | POA: Diagnosis not present

## 2017-09-19 DIAGNOSIS — R490 Dysphonia: Secondary | ICD-10-CM

## 2017-09-19 DIAGNOSIS — I10 Essential (primary) hypertension: Secondary | ICD-10-CM

## 2017-09-19 DIAGNOSIS — R059 Cough, unspecified: Secondary | ICD-10-CM

## 2017-09-19 NOTE — Progress Notes (Signed)
Subjective: Chief Complaint  Patient presents with  . recheck    recheck nodes and sore throat   Here with wife today.  Here for follow-up.  I saw him 08/29/2017 for lymphadenopathy, sore throat, cough and a variety of other concerns.  When I saw him in recheck on 09/06/2017 after using a round of antibiotic he was significantly improved with the sore throat cough and lymph nodes but not all the way resolved.  We did another round of antibiotic.  Currently he reports lymph nodes much improved, but still not 100% gone, still has some hoarse throat, still some cough, worse at night.   Last visit he had concerns about blood pressure, we adjusted his medication and have referred him to cardiology.  We recently added carvedilol.  His appointment with cardiology is not until October.  Home blood pressure readings have been good.  Compliant with carvedilol 3.125 mg twice daily, enalapril 20 mg daily, verapamil 180 mg SR daily  Hyperlipidemia-compliant with Lipitor 20 mg and aspirin 81 g daily.  Last visit he complained of fatigue, wanted testing for Lyme disease, had several concerns about fatigue and erections.  He notes no prior sleep study.  Not much snoring, no witnessed apnea.  Wife not concerned about sleep apnea.  Son has CPAP.  Denies GERD issues.  Using Vitamin D 3000 u daily.   Past Medical History:  Diagnosis Date  . Hypertension 2006  . Obesity   . Wears glasses    Current Outpatient Medications on File Prior to Visit  Medication Sig Dispense Refill  . aspirin EC 81 MG tablet Take 1 tablet (81 mg total) by mouth daily. 90 tablet 3  . atorvastatin (LIPITOR) 20 MG tablet TAKE 1 TABLET BY MOUTH DAILY 90 tablet 2  . carvedilol (COREG) 3.125 MG tablet Take 1 tablet (3.125 mg total) by mouth 2 (two) times daily. 60 tablet 2  . enalapril (VASOTEC) 20 MG tablet Take 1 tablet (20 mg total) by mouth daily. 90 tablet 1  . verapamil (CALAN-SR) 180 MG CR tablet Take 2 tablets (360 mg  total) by mouth daily. 180 tablet 3   No current facility-administered medications on file prior to visit.    ROS as in subjective    Objective: BP 124/80   Pulse (!) 58   Temp 98.8 F (37.1 C) (Oral)   Resp 16   Ht 6' (1.829 m)   Wt 253 lb 9.6 oz (115 kg)   SpO2 96%   BMI 34.39 kg/m  Wt Readings from Last 3 Encounters:  09/19/17 253 lb 9.6 oz (115 kg)  09/06/17 255 lb 6.4 oz (115.8 kg)  08/29/17 253 lb 3.2 oz (114.9 kg)   BP Readings from Last 3 Encounters:  09/19/17 124/80  09/06/17 140/90  08/29/17 (!) 160/88   General appearance: alert, no distress, WD/WN, obese white male scattered macules, scattered small cherry hemangiomas of torso anterior and posterior HEENT: normocephalic, sclerae anicteric, TMs pearly, nares patent, no discharge or erythema, pharynx without erythema today, right tonsillary region possible small polyp Oral cavity: MMM, no lesions Neck: supple, significant improvement from last week of several enlarged non tender swollen lymph nodes right anterior, right submandibular and right supraclavicular region, largest 1+ cm diameter now compared to last week, no thyromegaly, no masses Heart: RRR, normal S1, S2, no murmurs Lungs: CTA bilaterally, no wheezes, rhonchi, or rales Pulses: 2+ symmetric, upper and lower extremities, normal cap refill Ext: no edema Neuro: non focal exam   Assessment:  Encounter Diagnoses  Name Primary?  . Lymphadenopathy Yes  . Screen for colon cancer   . Essential hypertension   . Pneumonia due to infectious organism, unspecified laterality, unspecified part of lung   . Cough   . Fatigue, unspecified type   . Hoarseness   . Morbid obesity ([place])   . Localized enlarged lymph nodes   . Alkaline phosphatase elevation      Plan: Hypertension-improved after adding carvedilol recently. Continue plan to see cardiology in October, continue enalapril 20 mg daily, verapamil 180 mg 2 tablets daily, and continue carvedilol just  started last week.    Lymphadenopathy-we will go ahead and pursue CT neck/chest/abdomen.  Cough, sore throat, pneumonia on x-ray in early August - improving  Fatigue- reviewed recent labs.  Work on [employer], exercise  labs were negative for Lyme disease on August 23, testosterone was normal.  Hemoglobin A1c 5.5 percent August 15, alk phosphatase slightly elevated at this time as well.  CBC, lipids, thyroid look good 08/29/2017.   c/t vitamin D supplementation, plan recheck lab in 41mo  Romelle was seen today for recheck.  Diagnoses and all orders for this visit:  Lymphadenopathy  Screen for colon cancer  Essential hypertension  Pneumonia due to infectious organism, unspecified laterality, unspecified part of lung  Cough -     CT Soft Tissue Neck W Contrast; Future  Fatigue, unspecified type  Hoarseness  Morbid obesity ([facility])  Localized enlarged lymph nodes -     CT Soft Tissue Neck W Contrast; Future -     CT Chest W Contrast; Future -     CT ABDOMEN W CONTRAST; Future  Alkaline phosphatase elevation

## 2017-09-24 DIAGNOSIS — M1711 Unilateral primary osteoarthritis, right knee: Secondary | ICD-10-CM | POA: Insufficient documentation

## 2017-09-24 DIAGNOSIS — M1712 Unilateral primary osteoarthritis, left knee: Secondary | ICD-10-CM | POA: Insufficient documentation

## 2017-09-25 ENCOUNTER — Encounter: Payer: Self-pay | Admitting: Medical

## 2017-09-25 DIAGNOSIS — Z1211 Encounter for screening for malignant neoplasm of colon: Secondary | ICD-10-CM | POA: Diagnosis not present

## 2017-09-27 ENCOUNTER — Inpatient Hospital Stay: Admission: RE | Admit: 2017-09-27 | Payer: Medicare Other | Source: Ambulatory Visit

## 2017-10-01 DIAGNOSIS — M170 Bilateral primary osteoarthritis of knee: Secondary | ICD-10-CM | POA: Diagnosis not present

## 2017-10-01 LAB — COLOGUARD: Cologuard: POSITIVE

## 2017-10-02 ENCOUNTER — Telehealth: Payer: Self-pay | Admitting: Medical

## 2017-10-02 NOTE — Telephone Encounter (Signed)
Unfortunately Cologuard test result was positive.  This can indicate the presence of colorectal cancer or bleeding.   Thus, I recommend referral to gastroenterology at this time.   Please refer to gastroenterology for further evaluations.    

## 2017-10-03 ENCOUNTER — Other Ambulatory Visit: Payer: Self-pay

## 2017-10-03 DIAGNOSIS — R195 Other fecal abnormalities: Secondary | ICD-10-CM

## 2017-10-03 NOTE — Telephone Encounter (Signed)
Patient notified

## 2017-10-03 NOTE — Telephone Encounter (Signed)
Referral sent 

## 2017-10-07 ENCOUNTER — Ambulatory Visit
Admission: RE | Admit: 2017-10-07 | Discharge: 2017-10-07 | Disposition: A | Discharge status: Home / Self Care | Payer: Medicare Other | Source: Ambulatory Visit | Attending: Medical | Admitting: Medical

## 2017-10-07 DIAGNOSIS — R590 Localized enlarged lymph nodes: Secondary | ICD-10-CM | POA: Diagnosis not present

## 2017-10-07 DIAGNOSIS — R059 Cough, unspecified: Secondary | ICD-10-CM

## 2017-10-07 DIAGNOSIS — R599 Enlarged lymph nodes, unspecified: Secondary | ICD-10-CM | POA: Diagnosis not present

## 2017-10-07 DIAGNOSIS — R05 Cough: Secondary | ICD-10-CM

## 2017-10-07 MED ORDER — IOPAMIDOL (ISOVUE-300) INJECTION 61%
150.0000 mL | Freq: Once | INTRAVENOUS | Status: AC | PRN
  Administered 2017-10-07: 150 mL via INTRAVENOUS

## 2017-10-08 ENCOUNTER — Other Ambulatory Visit: Payer: Self-pay | Admitting: Medical

## 2017-10-08 DIAGNOSIS — M1711 Unilateral primary osteoarthritis, right knee: Secondary | ICD-10-CM | POA: Diagnosis not present

## 2017-10-08 DIAGNOSIS — R590 Localized enlarged lymph nodes: Secondary | ICD-10-CM

## 2017-10-08 DIAGNOSIS — M25561 Pain in right knee: Secondary | ICD-10-CM | POA: Diagnosis not present

## 2017-10-08 DIAGNOSIS — M1712 Unilateral primary osteoarthritis, left knee: Secondary | ICD-10-CM | POA: Diagnosis not present

## 2017-10-08 DIAGNOSIS — M25562 Pain in left knee: Secondary | ICD-10-CM | POA: Diagnosis not present

## 2017-10-08 MED ORDER — IOPAMIDOL (ISOVUE-300) INJECTION 61%: 150.0000 mL | Freq: Once | INTRAVENOUS | Status: DC | PRN

## 2017-10-08 MED ORDER — IOPAMIDOL (ISOVUE-300) INJECTION 61%
150.0000 mL | Freq: Once | INTRAVENOUS | Status: AC | PRN
  Administered 2017-10-07: 150 mL via INTRAVENOUS

## 2017-10-09 ENCOUNTER — Other Ambulatory Visit: Payer: Self-pay

## 2017-10-09 DIAGNOSIS — R591 Generalized enlarged lymph nodes: Secondary | ICD-10-CM

## 2017-10-18 ENCOUNTER — Other Ambulatory Visit: Payer: Self-pay

## 2017-10-18 DIAGNOSIS — R591 Generalized enlarged lymph nodes: Secondary | ICD-10-CM

## 2017-10-23 ENCOUNTER — Other Ambulatory Visit: Payer: Self-pay | Admitting: Radiology

## 2017-10-24 ENCOUNTER — Other Ambulatory Visit: Payer: Self-pay | Admitting: Student

## 2017-10-24 ENCOUNTER — Other Ambulatory Visit: Payer: Self-pay | Admitting: Hematology

## 2017-10-24 DIAGNOSIS — R591 Generalized enlarged lymph nodes: Secondary | ICD-10-CM

## 2017-10-24 NOTE — Progress Notes (Deleted)
Bradley Novak CONSULT NOTE  Patient Care Team: Tysinger, Leward Quan as PCP - General (Family Medicine)  ASSESSMENT & PLAN:    No orders of the defined types were placed in this encounter.   A total of more than {CHL ONC TIME VISIT - XLKGM:[id number]} were spent face-to-face with the patient during this encounter and over half of that time was spent on counseling and coordination of care as outlined above.    All questions were answered. The patient knows to call the clinic with any problems, questions or concerns.  Bradley Men, MD 10/24/2017 11:24 AM   CHIEF COMPLAINTS/PURPOSE OF CONSULTATION:  "I am here for ***"  HISTORY OF PRESENTING ILLNESS:  Bradley Novak 67 y.o. male is here because of cervical and thoracic lymphadenopathy.   [name] presented to his PCP in mid August 2019 for symptoms of cough, sore throat, and cervical adenopathy.  He was treated with a course of antibiotics with improvement in his symptoms, but lymphadenopathy did not resolve completely.  His PCP ordered CT chest abdomen pelvis, which showed right cervical and thoracic adenopathy.  He underwent ultrasound-guided lymph node biopsy of the right cervical lymph node on 10/25/2017.   I have reviewed his chart and materials related to his cancer extensively and collaborated history with the patient. Summary of oncologic history is as follows:  No history exists.    MEDICAL HISTORY:  Past Medical History:  Diagnosis Date  . Hypertension 2006  . Obesity   . Wears glasses     SURGICAL HISTORY: No past surgical history on file.  SOCIAL HISTORY: Social History   Socioeconomic History  . Marital status: Married    Spouse name: Not on file  . Number of children: Not on file  . Years of education: Not on file  . Highest education level: Not on file  Occupational History  . Not on file  Social Needs  . Financial resource strain: Not on file  . Food insecurity:    Worry: Not on file     Inability: Not on file  . Transportation needs:    Medical: Not on file    Non-medical: Not on file  Tobacco Use  . Smoking status: Never Smoker  . Smokeless tobacco: Never Used  Substance and Sexual Activity  . Alcohol use: No    Alcohol/week: 0.0 standard drinks  . Drug use: No  . Sexual activity: Not on file  Lifestyle  . Physical activity:    Days per week: Not on file    Minutes per session: Not on file  . Stress: Not on file  Relationships  . Social connections:    Talks on phone: Not on file    Gets together: Not on file    Attends religious service: Not on file    Active member of club or organization: Not on file    Attends meetings of clubs or organizations: Not on file    Relationship status: Not on file  . Intimate partner violence:    Fear of current or ex partner: Not on file    Emotionally abused: Not on file    Physically abused: Not on file    Forced sexual activity: Not on file  Other Topics Concern  . 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 Hypertension Father   . Hypertension Brother   . Hypertension Brother   . Hypertension Brother   . Hypertension Brother     ALLERGIES:  is allergic to hydrochlorothiazide.  MEDICATIONS:  Current Outpatient Medications  Medication Sig Dispense Refill  . aspirin EC 81 MG tablet Take 1 tablet (81 mg total) by mouth daily. (Patient taking differently: Take 81 mg by mouth at bedtime. ) 90 tablet 3  . atorvastatin (LIPITOR) 20 MG tablet TAKE 1 TABLET BY MOUTH DAILY (Patient taking differently: Take 20 mg by mouth every evening. ) 90 tablet 2  . carvedilol (COREG) 3.125 MG tablet Take 1 tablet (3.125 mg total) by mouth 2 (two) times daily. 60 tablet 2  . Cholecalciferol (VITAMIN D PO) Take 1  capsule by mouth at bedtime.    . diclofenac (VOLTAREN) 75 MG EC tablet Take 75 mg by mouth daily.    . enalapril (VASOTEC) 20 MG tablet Take 1 tablet (20 mg total) by mouth daily. (Patient taking differently: Take 20 mg by mouth at bedtime. ) 90 tablet 1  . ibuprofen (ADVIL,MOTRIN) 200 MG tablet Take 400 mg by mouth daily as needed for moderate pain.    . ranitidine (ZANTAC) 150 MG tablet Take 150 mg by mouth daily as needed for heartburn.    . verapamil (CALAN-SR) 180 MG CR tablet Take 2 tablets (360 mg total) by mouth daily. (Patient taking differently: Take 180 mg by mouth 2 (two) times daily. ) 180 tablet 3   No current facility-administered medications for this visit.     REVIEW OF SYSTEMS:   Constitutional: ( - ) fevers, ( - )  chills , ( - ) night sweats Eyes: ( - ) blurriness of vision, ( - ) double vision, ( - ) watery eyes Ears, nose, mouth, throat, and face: ( - ) mucositis, ( - ) sore throat Respiratory: ( - ) cough, ( - ) dyspnea, ( - ) wheezes Cardiovascular: ( - ) palpitation, ( - ) chest discomfort, ( - ) lower extremity swelling Gastrointestinal:  ( - ) nausea, ( - ) heartburn, ( - ) change in bowel habits Skin: ( - ) abnormal skin rashes Lymphatics: ( - ) new lymphadenopathy, ( - ) easy bruising Neurological: ( - ) numbness, ( - ) tingling, ( - ) new weaknesses Behavioral/Psych: ( - ) mood change, ( - ) new changes  All other systems were reviewed with the patient and are negative.  PHYSICAL EXAMINATION: ECOG PERFORMANCE STATUS: {CHL ONC ECOG PS:[id number]}  There were no vitals filed for this visit. There were no vitals filed for this visit.  GENERAL: alert, no distress and comfortable SKIN: skin color, texture, turgor are normal, no rashes or significant lesions EYES: conjunctiva are pink and non-injected, sclera clear OROPHARYNX: no exudate, no erythema; lips, buccal mucosa, and tongue normal  NECK: supple, non-tender LYMPH:  no palpable lymphadenopathy in the  cervical or axillary LUNGS: clear to auscultation and percussion with normal breathing effort HEART: regular rate & rhythm and no murmurs and no lower extremity edema ABDOMEN: soft, non-tender, non-distended, normal bowel sounds Musculoskeletal: no cyanosis of digits and no clubbing  PSYCH: alert & oriented x 3, fluent speech NEURO: no focal motor/sensory deficits  LABORATORY DATA:  I have reviewed the data as listed Lab Results  Component Value Date   WBC 8.0 08/29/2017   HGB 14.5 08/29/2017   HCT 42.0 08/29/2017  MCV 86 08/29/2017   PLT 237 08/29/2017   Lab Results  Component Value Date   NA 141 08/29/2017   K 4.3 08/29/2017   CL 103 08/29/2017   CO2 24 08/29/2017   ***I personally reviewed the patient's peripheral blood smear today.  There was no peripheral blast.  The white blood cells and red blood cells were of normal morphology. There was no schistocytosis or anisocytosis.  The platelets are of normal size and I have verified that there were no platelet clumping.  RADIOGRAPHIC STUDIES: I have personally reviewed the radiological images as listed and agreed with the findings in the report. Ct Soft Tissue Neck W Contrast  Result Date: 10/07/2017 CLINICAL DATA:  Enlarged, palpable right-sided lymph nodes. Fatigue and malaise EXAM: CT NECK WITH CONTRAST TECHNIQUE: Multidetector CT imaging of the neck was performed using the standard protocol following the bolus administration of intravenous contrast. CONTRAST:  149mL ISOVUE-300 IOPAMIDOL (ISOVUE-300) INJECTION 61% COMPARISON:  None. FINDINGS: Pharynx and larynx: No thickening of Waldeyer's ring. No discrete mass is seen. Tonsilliths. Salivary glands: No inflammation, mass, or stone. Thyroid: Normal. Lymph nodes: Cluster of enlarged rounded homogeneously enhancing nodes in the right jugular chain, posterior triangle, and supraclavicular fossa. No contralateral adenopathy is seen. Vascular: Mild atherosclerotic calcification Limited  intracranial: Negative Visualized orbits: Not covered Mastoids and visualized paranasal sinuses: Clear Skeleton: No acute or aggressive finding. Upper chest: Reported separately IMPRESSION: Enlarged nodes in the right jugular chain, posterior triangle, and supraclavicular fossa. The pattern is most concerning for lymphoma; multiple nodes are superficial and amenable to biopsy. Electronically Signed   By: Monte Fantasia M.D.   On: 10/07/2017 15:37   Ct Chest W Contrast  Result Date: 10/08/2017 CLINICAL DATA:  Evaluate enlarged lymph nodes within the right clavicle and right neck. EXAM: CT CHEST AND ABDOMEN WITH CONTRAST TECHNIQUE: Multidetector CT imaging of the chest and abdomen was performed following the standard protocol during bolus administration of intravenous contrast. CONTRAST:  190mL ISOVUE-300 IOPAMIDOL (ISOVUE-300) INJECTION 61% COMPARISON:  None. FINDINGS: CT CHEST FINDINGS Cardiovascular: Normal heart size. No pericardial effusion. Aortic atherosclerosis. Calcification in the left main, lad, left circumflex and RCA coronary arteries identified. Mediastinum/Nodes: Thyroid gland appears normal. Trachea appears patent and is midline. Normal appearance of the esophagus. No axillary adenopathy. Multiple prominent lymph nodes are identified within the right supraclavicular region, image number 1/2. Index lymph node is enlarged measuring 1.1 cm, image number 1/2. Numerous prominent mediastinal lymph nodes are identified. Index subcarinal lymph node measures 1.8 cm, image 32/2. No hilar adenopathy. Lungs/Pleura: No pleural effusion. No atelectasis, airspace consolidation or pneumothorax. No suspicious pulmonary nodules. Musculoskeletal: Nonaggressive appearing sclerotic focus within right seventh rib is noted, image 73/4. Favor benign bone island. CT ABDOMEN FINDINGS Hepatobiliary: No focal liver abnormality is seen. No gallstones, gallbladder wall thickening, or biliary dilatation. Pancreas: Unremarkable.  No pancreatic ductal dilatation or surrounding inflammatory changes. Spleen: Normal in size without focal abnormality. Adrenals/Urinary Tract: The adrenal glands appear normal. Unremarkable appearance of the kidneys. No mass or hydronephrosis. Stomach/Bowel: The stomach is normal. The small bowel loops have a normal course and caliber without obstruction. The visualized portions of the colon are unremarkable. Vascular/Lymphatic: Aortic atherosclerosis. No retroperitoneal adenopathy. Mild soft tissue stranding within the jejunal mesentery without adenopathy. Other: No abdominal wall hernia or abnormality. No abdominopelvic ascites. Musculoskeletal: No acute or significant osseous findings. IMPRESSION: 1. Right supraclavicular and subcarinal adenopathy is identified. Differential considerations include lymphoproliferative disorder, metastatic adenopathy, granulomatous inflammation/infection or reactive adenopathy. Consider further  evaluation with tissue sampling. 2. No acute findings, mass or adenopathy within the abdomen. 3. Lungs are clear.  No evidence for pneumonia. 4.  Aortic Atherosclerosis (ICD10-I70.0). Electronically Signed   By: Kerby Moors M.D.   On: 10/07/2017 15:41   Ct Abdomen W Contrast  Result Date: 10/08/2017 CLINICAL DATA:  Evaluate enlarged lymph nodes within the right clavicle and right neck. EXAM: CT CHEST AND ABDOMEN WITH CONTRAST TECHNIQUE: Multidetector CT imaging of the chest and abdomen was performed following the standard protocol during bolus administration of intravenous contrast. CONTRAST:  189mL ISOVUE-300 IOPAMIDOL (ISOVUE-300) INJECTION 61% COMPARISON:  None. FINDINGS: CT CHEST FINDINGS Cardiovascular: Normal heart size. No pericardial effusion. Aortic atherosclerosis. Calcification in the left main, lad, left circumflex and RCA coronary arteries identified. Mediastinum/Nodes: Thyroid gland appears normal. Trachea appears patent and is midline. Normal appearance of the  esophagus. No axillary adenopathy. Multiple prominent lymph nodes are identified within the right supraclavicular region, image number 1/2. Index lymph node is enlarged measuring 1.1 cm, image number 1/2. Numerous prominent mediastinal lymph nodes are identified. Index subcarinal lymph node measures 1.8 cm, image 32/2. No hilar adenopathy. Lungs/Pleura: No pleural effusion. No atelectasis, airspace consolidation or pneumothorax. No suspicious pulmonary nodules. Musculoskeletal: Nonaggressive appearing sclerotic focus within right seventh rib is noted, image 73/4. Favor benign bone island. CT ABDOMEN FINDINGS Hepatobiliary: No focal liver abnormality is seen. No gallstones, gallbladder wall thickening, or biliary dilatation. Pancreas: Unremarkable. No pancreatic ductal dilatation or surrounding inflammatory changes. Spleen: Normal in size without focal abnormality. Adrenals/Urinary Tract: The adrenal glands appear normal. Unremarkable appearance of the kidneys. No mass or hydronephrosis. Stomach/Bowel: The stomach is normal. The small bowel loops have a normal course and caliber without obstruction. The visualized portions of the colon are unremarkable. Vascular/Lymphatic: Aortic atherosclerosis. No retroperitoneal adenopathy. Mild soft tissue stranding within the jejunal mesentery without adenopathy. Other: No abdominal wall hernia or abnormality. No abdominopelvic ascites. Musculoskeletal: No acute or significant osseous findings. IMPRESSION: 1. Right supraclavicular and subcarinal adenopathy is identified. Differential considerations include lymphoproliferative disorder, metastatic adenopathy, granulomatous inflammation/infection or reactive adenopathy. Consider further evaluation with tissue sampling. 2. No acute findings, mass or adenopathy within the abdomen. 3. Lungs are clear.  No evidence for pneumonia. 4.  Aortic Atherosclerosis (ICD10-I70.0). Electronically Signed   By: Kerby Moors M.D.   On: 10/07/2017  15:41    PATHOLOGY: I have reviewed the pathology reports as documented in the oncologist history.

## 2017-10-25 ENCOUNTER — Ambulatory Visit (HOSPITAL_COMMUNITY)
Admission: RE | Admit: 2017-10-25 | Discharge: 2017-10-25 | Disposition: A | Discharge status: Home / Self Care | Payer: Medicare Other | Source: Ambulatory Visit | Attending: Medical | Admitting: Medical

## 2017-10-25 ENCOUNTER — Other Ambulatory Visit: Payer: Self-pay

## 2017-10-25 DIAGNOSIS — R591 Generalized enlarged lymph nodes: Secondary | ICD-10-CM

## 2017-10-25 DIAGNOSIS — R590 Localized enlarged lymph nodes: Secondary | ICD-10-CM | POA: Diagnosis not present

## 2017-10-25 DIAGNOSIS — I889 Nonspecific lymphadenitis, unspecified: Secondary | ICD-10-CM | POA: Diagnosis not present

## 2017-10-25 DIAGNOSIS — C8191 Hodgkin lymphoma, unspecified, lymph nodes of head, face, and neck: Secondary | ICD-10-CM | POA: Insufficient documentation

## 2017-10-25 LAB — BASIC METABOLIC PANEL
ANION GAP: 7 (ref 5–15)
BUN: 19 mg/dL (ref 8–23)
CHLORIDE: 106 mmol/L (ref 98–111)
CO2: 23 mmol/L (ref 22–32)
Calcium: 9 mg/dL (ref 8.9–10.3)
Creatinine, Ser: 1.01 mg/dL (ref 0.61–1.24)
GFR calc Af Amer: >60 mL/min (ref >60)
GFR calc non Af Amer: >60 mL/min (ref >60)
Glucose, Bld: 94 mg/dL (ref 70–99)
Potassium: 4.1 mmol/L (ref 3.5–5.1)
SODIUM: 136 mmol/L (ref 135–145)

## 2017-10-25 LAB — CBC WITH DIFFERENTIAL/PLATELET
Abs Immature Granulocytes: 0.04 10*3/uL (ref 0.00–0.07)
BASOS ABS: 0 10*3/uL (ref 0.0–0.1)
Basophils Relative: 0 %
EOS ABS: 0.1 10*3/uL (ref 0.0–0.5)
Eosinophils Relative: 1 %
HCT: 42.4 % (ref 39.0–52.0)
Hemoglobin: 14.3 g/dL (ref 13.0–17.0)
IMMATURE GRANULOCYTES: 0 %
LYMPHS ABS: 1.6 10*3/uL (ref 0.7–4.0)
LYMPHS PCT: 17 %
MCH: 29.9 pg (ref 26.0–34.0)
MCHC: 33.7 g/dL (ref 30.0–36.0)
MCV: 88.7 fL (ref 80.0–100.0)
Monocytes Absolute: 0.8 10*3/uL (ref 0.1–1.0)
Monocytes Relative: 9 %
NEUTROS PCT: 73 %
NRBC: 0 % (ref 0.0–0.2)
Neutro Abs: 6.5 10*3/uL (ref 1.7–7.7)
Platelets: 222 10*3/uL (ref 150–400)
RBC: 4.78 MIL/uL (ref 4.22–5.81)
RDW: 12.3 % (ref 11.5–15.5)
WBC: 9 10*3/uL (ref 4.0–10.5)

## 2017-10-25 LAB — PROTIME-INR
INR: 1.1
PROTHROMBIN TIME: 14.1 s (ref 11.4–15.2)

## 2017-10-25 MED ORDER — SODIUM CHLORIDE 0.9 % IV SOLN: INTRAVENOUS | Status: DC

## 2017-10-25 MED ORDER — LIDOCAINE HCL (PF) 1 % IJ SOLN
INTRAMUSCULAR | Status: AC
  Filled 2017-10-25: qty 30

## 2017-10-25 NOTE — Procedures (Signed)
US guided core biopsies of right cervical lymph node.  7 cores obtained.  Minimal blood loss and no immediate complication.

## 2017-10-28 ENCOUNTER — Telehealth: Payer: Self-pay | Admitting: Hematology

## 2017-10-28 NOTE — Telephone Encounter (Signed)
sw patient re rescheduling 10/15 appt. Per Dr Maylon Peppers patients  biopsy results will  not be back by tomorrow  therefore wanted to r/s his appt to 10/21. Pt stated he will be on a 10-day cruise starting 10/17 and will not be back until 10/26. He already has an appt on 10/28 so we scheduled his appt at [place] 10/29 at 0930. Pt and MD is aware of appt date/time.

## 2017-10-29 ENCOUNTER — Inpatient Hospital Stay: Payer: Medicare Other

## 2017-10-29 ENCOUNTER — Ambulatory Visit: Payer: Medicare Other | Admitting: Hematology

## 2017-10-29 ENCOUNTER — Telehealth: Payer: Self-pay

## 2017-10-29 NOTE — Telephone Encounter (Signed)
Dr Melina Copa called report on Patient that he is + for classic hodgkin lymphomia.  You may call her back if you have questions for her.

## 2017-10-30 ENCOUNTER — Telehealth: Payer: Self-pay | Admitting: Hematology

## 2017-10-30 ENCOUNTER — Telehealth: Payer: Self-pay | Admitting: Medical

## 2017-10-30 NOTE — Telephone Encounter (Signed)
I called and spoke to patient about my communication with Dr. Maylon Peppers that he should begin eval and workup now and not wait.   Bradley Novak wants to go on his cruise that he leaves for tomorrow with friends that he has planned months ago and spent a lot of money on .  He will plan to see Dr. Maylon Peppers as scheduled 11/12/17.  I advised that he will need PET scan, port a cath placement and begin therapy.

## 2017-10-30 NOTE — Telephone Encounter (Signed)
I received a message from the patient's primary care provider regarding preliminary pathology report from the recent lymph node biopsy showing Hodgkin lymphoma.  Formal report has not been posted in epic.  I contacted the patient by phone and discussed with him regarding the preliminary biopsy.  I briefly discussed with him regarding the diagnosis and work-up, and that he will require chemotherapy if the diagnosis is confirmed.  I offered the patient the earliest clinic appointment to be seen (10/31/2017), but the patient stated that he and his family had already made arrangement for a cruise, and he is leaving early morning on 10/31/2017.  I discussed with the patient some of the potential risks if his Hodgkin lymphoma continue to progress, including infection.  Patient appreciated the phone call, but indicated that he intended to keep his travel plan and that he will see oncology after he returns from his trip.  I suggested to the patient that if he develops worsening symptoms while on the cruise, he should seek medical attention as soon as possible.  Patient expressed understanding and agreed to the plan.

## 2017-10-30 NOTE — Telephone Encounter (Signed)
Bradley Novak  Please call the oncology office at [facility] where we he has appointment on October 29.  I sent an email message to Dr. Maylon Peppers, but please call and see if they can make sure he sees the email today ASAP.  Normally we use oncology over close to [facility], so not sure how this appointment came to be at oncology on [place]?  Just curious.  The good news is we have him an appointment nonetheless.  FYI I called and spoke to patient about his biopsy results showing Hodgkin's lymphoma.  He leaves town tomorrow morning through next Saturday for a cruise.  His appointment is on October 29 with oncology.  He reports that the lymph nodes are even bigger in his right neck  I advised that I would reach out to oncology to see if we need to do anything now before he gets back from his cruise

## 2017-11-04 ENCOUNTER — Ambulatory Visit: Payer: Medicare Other | Admitting: Hematology

## 2017-11-04 ENCOUNTER — Inpatient Hospital Stay: Payer: Medicare Other

## 2017-11-11 ENCOUNTER — Encounter: Payer: Self-pay | Admitting: Cardiovascular Disease

## 2017-11-11 ENCOUNTER — Other Ambulatory Visit: Payer: Self-pay | Admitting: Hematology

## 2017-11-11 ENCOUNTER — Ambulatory Visit (INDEPENDENT_AMBULATORY_CARE_PROVIDER_SITE_OTHER): Payer: Medicare Other | Admitting: Cardiovascular Disease

## 2017-11-11 VITALS — BP 136/80 | HR 61 | Ht 71.0 in | Wt 259.0 lb

## 2017-11-11 DIAGNOSIS — I2584 Coronary atherosclerosis due to calcified coronary lesion: Secondary | ICD-10-CM

## 2017-11-11 DIAGNOSIS — I2510 Atherosclerotic heart disease of native coronary artery without angina pectoris: Secondary | ICD-10-CM

## 2017-11-11 DIAGNOSIS — C8190 Hodgkin lymphoma, unspecified, unspecified site: Secondary | ICD-10-CM

## 2017-11-11 DIAGNOSIS — I10 Essential (primary) hypertension: Secondary | ICD-10-CM | POA: Diagnosis not present

## 2017-11-11 MED ORDER — CARVEDILOL 6.25 MG PO TABS: 6.2500 mg | ORAL_TABLET | Freq: Two times a day (BID) | ORAL | 3 refills | Status: DC

## 2017-11-11 MED ORDER — LOSARTAN POTASSIUM 100 MG PO TABS: 100.0000 mg | ORAL_TABLET | Freq: Every day | ORAL | 3 refills | Status: DC

## 2017-11-11 NOTE — Patient Instructions (Addendum)
Medication Instructions:  Your physician has recommended you make the following change in your medication:   STOP Vasotec STOP Verapamil START Losartan (Cozaar) 100 mg once daily INCREASE Carvedilol (Coreg) to 6.25 mg twice daily  If you need a refill on your cardiac medications before your next appointment, please call your pharmacy.   Lab work: Your physician recommends that you return for lab work in: 3 weeks for basic metabolic panel on the same day as Nurse Visit/BP check   If you have labs (blood work) drawn today and your tests are completely normal, you will receive your results only by: Marland Kitchen MyChart Message (if you have MyChart) OR . A paper copy in the mail If you have any lab test that is abnormal or we need to change your treatment, we will call you to review the results.   Testing/Procedures: Your physician has requested that you have an echocardiogram. Echocardiography is a painless test that uses sound waves to create images of your heart. It provides your doctor with information about the size and shape of your heart and how well your heart's chambers and valves are working. This procedure takes approximately one hour. There are no restrictions for this procedure.    Follow-Up: Your physician recommends that you schedule a follow-up appointment in: 3 weeks with Nurse for BP check   At [facility], you and your health needs are our priority.  As part of our continuing mission to provide you with exceptional heart care, we have created designated Provider Care Teams.  These Care Teams include your primary Cardiologist (physician) and Advanced Practice Providers (APPs -  Physician Assistants and Nurse Practitioners) who all work together to provide you with the care you need, when you need it. You will need a follow up appointment in:  3 months.  You may see Mertie Moores, MD or one of the following Advanced Practice Providers on your designated Care Team: Richardson Dopp,  PA-C [place], [place] . Daune Perch, NP

## 2017-11-11 NOTE — Progress Notes (Signed)
[place] CONSULT NOTE  Patient Care Team: Tysinger, Camelia Eng, PA-C as PCP - General (Family Medicine) Nahser, Wonda Cheng, MD as PCP - Cardiology (Cardiology)  HEME/ONC OVERVIEW: 1. Stage IIA classic Hodgkin lymphoma, subtype NOS due to limited specimen -Bx-proven R cervical LN involvement in 09/2017; R cervical and mediastinal LN involvement by CT  ASSESSMENT & PLAN:  Stage IIA classic Hodgkin lymphoma, favorable risk  -I reviewed the patient's radiologic studies, and agree with the findings as documented -Briefly, CT neck and CAP in September 2019 showed prominent right cervical and mediastinal lymphadenopathy -Biopsy of a cervical lymph node showed classic Hodgkin lymphoma, subtype NOS due to the specimen -Patient denies any constitutional symptoms, such as fever, chill, weight loss, or night sweats -Based on this information (ESR pending), patient most likely has Stage IIA favorable disease, the treatment options for which include chemotherapy alone vs. chemotherapy with consolidative radiation -However, given the mediastinal involvement, bleomycin followed by thoracic radiation significantly increases the risk of pulmonary toxicities, and therefore chemotherapy alone is likely associated with improved toxicity profile -I have ordered the following tests in preparation for chemotherapy: echocardiogram, pulmonary function test, PET scan, port placement  -We discussed the role of chemotherapy. The intent is for curative intent. -We discussed some of the risks, benefits, side-effects of Adriamycin, Bleomycin, Dacarbazine and Vinblastine -Some of the short term side-effects included, though not limited to, risk of fatigue, risk of allergic reactions, cardiomyopathy, lung problems (specifically bleomycin-induced pulmonary toxicity up to 20-30% of patients), alopecia, mouth sores, weight loss, pancytopenia, life-threatening infections, need for transfusions of blood products, nausea,  vomiting, change in bowel habits, blood clots, admission to hospital for various reasons, and risks of death.  -Long term side-effects are also discussed including risks of infertility, bleomycin-induced pulmonary toxicity, anthracycline-associated cardiotoxicity, permanent damage to nerve function, chronic fatigue, and rare secondary malignancy including bone marrow disorders and leukemia.  -The patient is aware that the response rates discussed earlier is not guaranteed.  After a long discussion, patient made an informed decision to proceed with the prescribed plan of care.  -Tentatively 1st dose of chemotherapy scheduled on 11/26/2017  Orders Placed This Encounter  Procedures  . IR IMAGING GUIDED PORT INSERTION    Standing Status:   Future    Standing Expiration Date:   01/13/2019    Order Specific Question:   Reason for Exam (SYMPTOM  OR DIAGNOSIS REQUIRED)    Answer:   Chemotherapy access for Hodgkin lymphoma    Order Specific Question:   Preferred Imaging Location?    Answer:   [facility] Image Initial (PI) Whole Body    Standing Status:   Future    Standing Expiration Date:   11/12/2018    Order Specific Question:   ** REASON FOR EXAM (FREE TEXT)    Answer:   for Hodgkin staging study    Order Specific Question:   If indicated for the ordered procedure, I authorize the administration of a radiopharmaceutical per Radiology protocol    Answer:   Yes    Order Specific Question:   Preferred imaging location?    Answer:   [facility]    Order Specific Question:   Radiology Contrast Protocol - do NOT remove file path    Answer:   [email or web address]  . CBC with Differential ([place] Only)    Standing Status:   Future    Standing Expiration Date:   12/17/2018  . CMP ([place]  only)    Standing Status:   Future    Standing Expiration Date:   12/17/2018  . CBC with Differential ([facility] Center Only)    Standing Status:    Standing    Number of Occurrences:   20    Standing Expiration Date:   11/13/2018  . CMP ([place] only)    Standing Status:   Standing    Number of Occurrences:   20    Standing Expiration Date:   11/13/2018  . Pulmonary Function Test    Standing Status:   Future    Standing Expiration Date:   11/13/2018    Scheduling Instructions:     Please schedule ASAP prior to starting chemotherapy for Hodgkin lymphoma    Order Specific Question:   Where should this test be performed?    Answer:   [facility]    Order Specific Question:   Full PFT: includes the following: basic spirometry, spirometry pre & post bronchodilator, diffusion capacity (DLCO), lung volumes    Answer:   Full PFT    A total of more than 60 minutes were spent face-to-face with the patient during this encounter and over half of that time was spent on counseling and coordination of care as outlined above.    All questions were answered. The patient knows to call the clinic with any problems, questions or concerns.  [name], MD 11/12/2017 12:34 PM   CHIEF COMPLAINTS/PURPOSE OF CONSULTATION:  "I am here for Hodgkin lymphoma"  HISTORY OF PRESENTING ILLNESS:  Bradley Novak 67 y.o. male is here because of newly diagnosed Hodgkin lymphoma.  Vision reports that he first developed a persistent cough in mid July 2019, without any associated fever, chill, weight loss, night sweats, chest pain, dyspnea, or hemoptysis.  In August 2019, he also noticed an enlarged right cervical lymph node just above the right collarbone, for which he thought was due to an upper respiratory tract infection.  He presented to his PCP, who prescribed an empiric course of antibiotics with initial improvement in the right cervical lymphadenopathy, but subsequently the right cervical lymphadenopathy became worse.  Due to the persistent lymphadenopathy, further imaging studies were pursued, which showed right cervical and mediastinal  lymphadenopathy.  He underwent right cervical lymph node biopsy, which showed classic Hodgkin lymphoma NOS.  Patient endorses increased fatigue, but otherwise denies any constitutional symptoms, chest pain, shortness of breath, or palpitations.  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  He had briefly smoked cigarettes when he was a teenager, but quit and has not resumed any tobacco use.  I have reviewed his chart and materials related to his cancer extensively and collaborated history with the patient. Summary of oncologic history is as follows:   Hodgkin lymphoma ([place])   08/29/2017 Initial Diagnosis    Hodgkin lymphoma ([place])    10/07/2017 Imaging    CT neck w/ contrast: Enlarged nodes in the right jugular chain, posterior triangle, and supraclavicular fossa. The pattern is most concerning for lymphoma; multiple nodes are superficial and amenable to biopsy.  CT CAP w/ contrast: 1. Right supraclavicular and subcarinal adenopathy is identified. Differential considerations include lymphoproliferative disorder, metastatic adenopathy, granulomatous inflammation/infection or reactive adenopathy. Consider further evaluation with tissue sampling. 2. No acute findings, mass or adenopathy within the abdomen. 3. Lungs are clear.  No evidence for pneumonia.  4.  Aortic Atherosclerosis (ICD10-I70.0).    10/25/2017 Procedure    US-guided R cervical LN  biopsy    10/25/2017 Pathology Results    (Accession: [id number]) Lymph node, needle/core biopsy, Right Cervical - CLASSIC HODGKIN LYMPHOMA - SEE COMMENT - CD30 (1%) - Unable to subtype due to limited sample specimen    11/12/2017 Cancer Staging    Staging form: Hodgkin and Non-Hodgkin Lymphoma, AJCC 8th Edition - Clinical stage from 11/12/2017: Stage II (Hodgkin lymphoma, A - Asymptomatic) - Signed by [name], MD on 11/12/2017    11/12/2017 -  Chemotherapy    The patient had DOXOrubicin (ADRIAMYCIN)  chemo injection 60 mg, 25 mg/m2 = 60 mg, Intravenous,  Once, 0 of 4 cycles palonosetron (ALOXI) injection 0.25 mg, 0.25 mg, Intravenous,  Once, 0 of 4 cycles bleomycin (BLEOCIN) 24 Units in sodium chloride 0.9 % 50 mL chemo infusion, 10 Units/m2 = 24 Units, Intravenous,  Once, 0 of 4 cycles dacarbazine (DTIC) 910 mg in sodium chloride 0.9 % 250 mL chemo infusion, 375 mg/m2 = 910 mg, Intravenous,  Once, 0 of 4 cycles vinBLAStine (VELBAN) 14.6 mg in sodium chloride 0.9 % 50 mL chemo infusion, 6 mg/m2 = 14.6 mg, Intravenous, Once, 0 of 4 cycles fosaprepitant (EMEND) 150 mg, dexamethasone (DECADRON) 12 mg in sodium chloride 0.9 % 145 mL IVPB, , Intravenous,  Once, 0 of 4 cycles  for chemotherapy treatment.      MEDICAL HISTORY:  Past Medical History:  Diagnosis Date  . Hypertension 2006  . Obesity   . Wears glasses     SURGICAL HISTORY: No past surgical history on file.  SOCIAL HISTORY: Social History   Socioeconomic History  . Marital status: Married    Spouse name: Not on file  . Number of children: Not on file  . Years of education: Not on file  . Highest education level: Not on file  Occupational History  . Not on file  Social Needs  . Financial resource strain: Not on file  . Food insecurity:    Worry: Not on file    Inability: Not on file  . Transportation needs:    Medical: Not on file    Non-medical: Not on file  Tobacco Use  . Smoking status: Never Smoker  . Smokeless tobacco: Never Used  Substance and Sexual Activity  . Alcohol use: No    Alcohol/week: 0.0 standard drinks  . Drug use: No  . Sexual activity: Not on file  Lifestyle  . Physical activity:    Days per week: Not on file    Minutes per session: Not on file  . Stress: Not on file  Relationships  . Social connections:    Talks on phone: Not on file    Gets together: Not on file    Attends religious service: Not on file    Active member of club or organization: Not on file    Attends meetings of  clubs or organizations: Not on file    Relationship status: Not on file  . Intimate partner violence:    Fear of current or ex partner: Not on file    Emotionally abused: Not on file    Physically abused: Not on file    Forced sexual activity: Not on file  Other Topics Concern  . 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  Hypertension Father   . Hypertension Brother   . Hypertension Brother   . Hypertension Brother   . Hypertension Brother     ALLERGIES:  has No Known Allergies.  MEDICATIONS:  Current Outpatient Medications  Medication Sig Dispense Refill  . aspirin EC 81 MG tablet Take 1 tablet (81 mg total) by mouth daily. 90 tablet 3  . atorvastatin (LIPITOR) 20 MG tablet TAKE 1 TABLET BY MOUTH DAILY 90 tablet 2  . carvedilol (COREG) 6.25 MG tablet Take 1 tablet (6.25 mg total) by mouth 2 (two) times daily. 180 tablet 3  . Cholecalciferol (VITAMIN D PO) Take 1 capsule by mouth at bedtime.    . diclofenac (VOLTAREN) 75 MG EC tablet Take 75 mg by mouth daily.    Marland Kitchen ibuprofen (ADVIL,MOTRIN) 200 MG tablet Take 400 mg by mouth daily as needed for moderate pain.    Marland Kitchen losartan (COZAAR) 100 MG tablet Take 1 tablet (100 mg total) by mouth daily. 90 tablet 3  . ranitidine (ZANTAC) 150 MG tablet Take 150 mg by mouth daily as needed for heartburn.     No current facility-administered medications for this visit.     REVIEW OF SYSTEMS:   Constitutional: ( - ) fevers, ( - )  chills , ( - ) night sweats Eyes: ( - ) blurriness of vision, ( - ) double vision, ( - ) watery eyes Ears, nose, mouth, throat, and face: ( - ) mucositis, ( - ) sore throat Respiratory: ( - ) cough, ( - ) dyspnea, ( - ) wheezes Cardiovascular: ( - ) palpitation, ( - ) chest discomfort, ( - ) lower  extremity swelling Gastrointestinal:  ( - ) nausea, ( - ) heartburn, ( - ) change in bowel habits Skin: ( - ) abnormal skin rashes Lymphatics: ( - ) new lymphadenopathy, ( - ) easy bruising Neurological: ( - ) numbness, ( - ) tingling, ( - ) new weaknesses Behavioral/Psych: ( - ) mood change, ( - ) new changes  All other systems were reviewed with the patient and are negative.  PHYSICAL EXAMINATION: ECOG PERFORMANCE STATUS: 0 - Asymptomatic  Vitals:   11/12/17 0947  BP: (!) 153/68  Pulse: (!) 58  Resp: 19  Temp: 98.4 F (36.9 C)  SpO2: 98%   Filed Weights   11/12/17 0947  Weight: 259 lb 6.4 oz (117.7 kg)    GENERAL: alert, no distress and comfortable SKIN: skin color, texture, turgor are normal, no rashes or significant lesions EYES: conjunctiva are pink and non-injected, sclera clear OROPHARYNX: no exudate, no erythema; lips, buccal mucosa, and tongue normal  NECK: supple, non-tender LYMPH:  Right cervical adenopathy measuring 1-2cm in several LN's, no left cervical or axillary adenopathy  LUNGS: clear to auscultation and percussion with normal breathing effort HEART: regular rate & rhythm, no murmurs, no lower extremity edema ABDOMEN: soft, non-tender, non-distended, normal bowel sounds Musculoskeletal: no cyanosis of digits and no clubbing  PSYCH: alert & oriented x 3, fluent speech NEURO: no focal motor/sensory deficits  LABORATORY DATA:  I have reviewed the data as listed Lab Results  Component Value Date   WBC 9.8 11/12/2017   HGB 13.5 11/12/2017   HCT 42.1 11/12/2017   MCV 90.1 11/12/2017   PLT 244 11/12/2017   Lab Results  Component Value Date   NA 141 11/12/2017   K 3.7 11/12/2017   CL 103 11/12/2017   CO2 26 11/12/2017    RADIOGRAPHIC STUDIES: I have personally reviewed the radiological  images as listed and agreed with the findings in the report. [place] Core Biopsy (lymph Nodes)  Result Date: 10/25/2017 INDICATION: 67 year old with right cervical  lymphadenopathy. Tissue diagnosis is needed. Concern for lymphoma. EXAM: ULTRASOUND-GUIDED RIGHT CERVICAL LYMPH NODE BIOPSY MEDICATIONS: None. ANESTHESIA/SEDATION: None FLUOROSCOPY TIME:  None COMPLICATIONS: None immediate. PROCEDURE: Informed written consent was obtained from the patient after a thorough discussion of the procedural risks, benefits and alternatives. All questions were addressed. A timeout was performed prior to the initiation of the procedure. Right side of the neck was evaluated with ultrasound. Multiple enlarged hypoechoic lymph nodes were identified. A lymph node in the right supraclavicular area was targeted for biopsy. The neck was prepped with chlorhexidine and a sterile field was created. Skin and soft tissues anesthetized with 1% lidocaine. Using ultrasound guidance, core biopsies were obtained from the neck lymph node with an 18 gauge device. Specimens placed in saline. Total of 7 core biopsies obtained. Bandage placed over the puncture site. FINDINGS: Numerous hypoechoic lymph nodes scattered throughout the right side of the neck. 7 core biopsies obtained from a right cervical lymph node. Minimal bleeding around the lymph node at the end of the procedure. IMPRESSION: Successful ultrasound-guided core biopsies of a right supraclavicular lymph node. Electronically Signed   By: Markus Daft M.D.   On: 10/25/2017 20:16    PATHOLOGY: I have reviewed the pathology reports as documented in the oncologist history.

## 2017-11-11 NOTE — Progress Notes (Signed)
Cardiology Office Note:    Date:  11/11/2017   ID:  Bradley Novak, DOB February 22, 1950, MRN [id number]  PCP:  Bradley Hurl, PA-C  Cardiologist:  Bradley Moores, MD  Electrophysiologist:  None   Referring MD: Bradley Hurl, PA-C   Problem list 1.  Hypertension 2.  Coronary artery calcifications 3.  Hodgkin's lymphoma   Chief Complaint  Patient presents with  . Hypertension  . Coronary Artery Disease         Bradley Novak is a 67 y.o. male with a hx of HTN   We were asked to see him today for further evaluation of his HTN by Bradley Novak  has a history of hypertension and hyperlipidemia. He had pneumonia this past year  Had a CT scan that showed Hodgkins lymphoma and coronary  Calcifications   Had had lymph node Bx   Has been on on Vasotec  And verapamil for years  No CP ,  No real exercise - active at work [name] )  Has some shortness of breath   And fatigue over the past 3-4 months  Has lots of fatigue  Recent lipid levels from August, 2019 look great.  LDL is 70.  HDL is 44.  Total cholesterol is 131.  Triglyceride level is 85.  Non smoker Does not drink Eats fairly well   No wt gain or loss No hematura  no N,V,D No syncope or presyncope  Has the usual MSK pain    Past Medical History:  Diagnosis Date  . Hypertension 2006  . Obesity   . Wears glasses     History reviewed. No pertinent surgical history.  Current Medications: Current Meds  Medication Sig  . aspirin EC 81 MG tablet Take 1 tablet (81 mg total) by mouth daily.  Marland Kitchen atorvastatin (LIPITOR) 20 MG tablet TAKE 1 TABLET BY MOUTH DAILY  . Cholecalciferol (VITAMIN D PO) Take 1 capsule by mouth at bedtime.  . diclofenac (VOLTAREN) 75 MG EC tablet Take 75 mg by mouth daily.  Marland Kitchen ibuprofen (ADVIL,MOTRIN) 200 MG tablet Take 400 mg by mouth daily as needed for moderate pain.  . ranitidine (ZANTAC) 150 MG tablet Take 150 mg by mouth daily as needed for  heartburn.  . [DISCONTINUED] carvedilol (COREG) 3.125 MG tablet Take 1 tablet (3.125 mg total) by mouth 2 (two) times daily.  . [DISCONTINUED] enalapril (VASOTEC) 20 MG tablet Take 1 tablet (20 mg total) by mouth daily.  . [DISCONTINUED] verapamil (CALAN-SR) 180 MG CR tablet Take 2 tablets (360 mg total) by mouth daily.     Allergies:   Hydrochlorothiazide   Social History   Socioeconomic History  . Marital status: Married    Spouse name: Not on file  . Number of children: Not on file  . Years of education: Not on file  . Highest education level: Not on file  Occupational History  . Not on file  Social Needs  . Financial resource strain: Not on file  . Food insecurity:    Worry: Not on file    Inability: Not on file  . Transportation needs:    Medical: Not on file    Non-medical: Not on file  Tobacco Use  . Smoking status: Never Smoker  . Smokeless tobacco: Never Used  Substance and Sexual Activity  . Alcohol use: No    Alcohol/week: 0.0 standard drinks  . Drug use: No  . Sexual activity: Not on file  Lifestyle  . Physical activity:    Days per week: Not on file    Minutes per session: Not on file  . Stress: Not on file  Relationships  . Social connections:    Talks on phone: Not on file    Gets together: Not on file    Attends religious service: Not on file    Active member of club or organization: Not on file    Attends meetings of clubs or organizations: Not on file    Relationship status: Not on file  Other Topics Concern  . 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  ROS:   Please see the history of present illness.     All other systems reviewed and are negative.  EKGs/Labs/Other  Studies Reviewed:    The following studies were reviewed today:   EKG: August, 2019: Sinus bradycardia.  No ST or T wave changes.  Incomplete right bundle branch block.  Recent Labs: 08/29/2017: ALT 24; TSH 2.280 10/25/2017: BUN 19; Creatinine, Ser 1.01; Hemoglobin 14.3; Platelets 222; Potassium 4.1; Sodium 136  Recent Lipid Panel    Component Value Date/Time   CHOL 131 08/29/2017 1155   TRIG 85 08/29/2017 1155   HDL 44 08/29/2017 1155   CHOLHDL 3.0 08/29/2017 1155   CHOLHDL 5.1 (H) 09/27/2014 0001   VLDL 37 (H) 09/27/2014 0001   LDLCALC 70 08/29/2017 1155    Physical Exam:    VS:  BP 136/80   Pulse 61   Ht 5\' 11"  (1.803 m)   Wt 259 lb (117.5 kg)   SpO2 95%   BMI 36.12 kg/m     Wt Readings from Last 3 Encounters:  11/11/17 259 lb (117.5 kg)  10/25/17 253 lb (114.8 kg)  09/19/17 253 lb 9.6 oz (115 kg)     GEN:   Moderately obese, middle-aged gentleman no acute distress HEENT: Normal NECK: No JVD; No carotid bruits LYMPHATICS: No lymphadenopathy CARDIAC: RRR, no murmurs, rubs, gallops RESPIRATORY:  Clear to auscultation without rales, wheezing or rhonchi  ABDOMEN: Soft, non-tender, non-distended MUSCULOSKELETAL:  No edema; No deformity  SKIN: Warm and dry NEUROLOGIC:  Alert and oriented x 3 PSYCHIATRIC:  Normal affect   ASSESSMENT:    1. Coronary artery calcification   2. Essential hypertension   3. Hodgkin lymphoma, unspecified Hodgkin lymphoma type, unspecified body region [name])    PLAN:    In order of problems listed above:  1. Coronary artery calcifications: Bradley Novak was incidentally found to have coronary artery calcifications when he presented for CT scan.  He denies having any episodes of angina.  He does have a history of hyperlipidemia but his lipids are very well controlled on current dose of atorvastatin.  Is just diagnosed with Hodgkin's lymphoma on that very same CT scan.  I think at some point it would be useful to do a coronary CT angiogram but I  would like to wait until after he is a bit further along in his Hodgkin's lymphoma evaluation.  He is not having any angina.  Would like to do an echocardiogram for further assessment of his LV function.  2.  Hypertension: He has had hypertension for years.  He is on verapamil, Vasotec, carvedilol.  We will discontinue the verapamil and Vasotec.  We will start him on losartan 100 mg a day.  We will increase the carvedilol to 6.25 mg twice a day.  We will check a basic metabolic profile in 3 weeks.  He will also have a nurse visit blood pressure check at that time.  I will see him again in 3 months for follow-up visit.  We will get an echocardiogram for further evaluation of his LV function.  Medication Adjustments/Labs and Tests Ordered: Current medicines are reviewed at length with the patient today.  Concerns regarding medicines are outlined above.  Orders Placed This Encounter  Procedures  . Basic Metabolic Panel (BMET)  . ECHOCARDIOGRAM COMPLETE   Meds ordered this encounter  Medications  . carvedilol (COREG) 6.25 MG tablet    Sig: Take 1 tablet (6.25 mg total) by mouth 2 (two) times daily.    Dispense:  180 tablet    Refill:  3  . losartan (COZAAR) 100 MG tablet    Sig: Take 1 tablet (100 mg total) by mouth daily.    Dispense:  90 tablet    Refill:  3     Patient Instructions  Medication Instructions:  Your physician has recommended you make the following change in your medication:   STOP Vasotec STOP Verapamil START Losartan (Cozaar) 100 mg once daily INCREASE Carvedilol (Coreg) to 6.25 mg twice daily  If you need a refill on your cardiac medications before your next appointment, please call your pharmacy.   Lab work: Your physician recommends that you return for lab work in: 3 weeks for basic metabolic panel on the same day as Nurse Visit/BP check   If you have labs (blood work) drawn today and your tests are completely normal, you will receive your results only  by: Marland Kitchen MyChart Message (if you have MyChart) OR . A paper copy in the mail If you have any lab test that is abnormal or we need to change your treatment, we will call you to review the results.   Testing/Procedures: Your physician has requested that you have an echocardiogram. Echocardiography is a painless test that uses sound waves to create images of your heart. It provides your doctor with information about the size and shape of your heart and how well your heart's chambers and valves are working. This procedure takes approximately one hour. There are no restrictions for this procedure.    Follow-Up: Your physician recommends that you schedule a follow-up appointment in: 3 weeks with Nurse for BP check   At [facility], you and your health needs are our priority.  As part of our continuing mission to provide you with exceptional heart care, we have created designated Provider Care Teams.  These Care Teams include your primary Cardiologist (physician) and Advanced Practice Providers (APPs -  Physician Assistants and Nurse Practitioners) who all work together to provide you with the care you need, when you need it. You will need a follow up appointment in:  3 months.  You may see Bradley Moores, MD or one of the following Advanced Practice Providers on your designated Care Team: Richardson Dopp, PA-C [place], [place] . Daune Perch, NP        Signed, Bradley Moores, MD  11/11/2017 6:45 PM    [place]

## 2017-11-12 ENCOUNTER — Inpatient Hospital Stay: Payer: Medicare Other | Attending: Hematology

## 2017-11-12 ENCOUNTER — Inpatient Hospital Stay (HOSPITAL_BASED_OUTPATIENT_CLINIC_OR_DEPARTMENT_OTHER): Payer: Medicare Other | Admitting: Hematology

## 2017-11-12 ENCOUNTER — Telehealth: Payer: Self-pay | Admitting: Hematology

## 2017-11-12 VITALS — BP 153/68 | HR 58 | Temp 98.4°F | Resp 19 | Wt 259.4 lb

## 2017-11-12 DIAGNOSIS — E669 Obesity, unspecified: Secondary | ICD-10-CM | POA: Diagnosis not present

## 2017-11-12 DIAGNOSIS — I10 Essential (primary) hypertension: Secondary | ICD-10-CM | POA: Insufficient documentation

## 2017-11-12 DIAGNOSIS — Z7982 Long term (current) use of aspirin: Secondary | ICD-10-CM

## 2017-11-12 DIAGNOSIS — Z79899 Other long term (current) drug therapy: Secondary | ICD-10-CM | POA: Insufficient documentation

## 2017-11-12 DIAGNOSIS — C8190 Hodgkin lymphoma, unspecified, unspecified site: Secondary | ICD-10-CM

## 2017-11-12 DIAGNOSIS — R591 Generalized enlarged lymph nodes: Secondary | ICD-10-CM

## 2017-11-12 LAB — CMP (CANCER CENTER ONLY)
ALT: 32 U/L (ref 0–44)
AST: 28 U/L (ref 15–41)
Albumin: 3.6 g/dL (ref 3.5–5.0)
Alkaline Phosphatase: 106 U/L (ref 38–126)
Anion gap: 12 (ref 5–15)
BILIRUBIN TOTAL: 0.6 mg/dL (ref 0.3–1.2)
BUN: 21 mg/dL (ref 8–23)
CO2: 26 mmol/L (ref 22–32)
Calcium: 9.3 mg/dL (ref 8.9–10.3)
Chloride: 103 mmol/L (ref 98–111)
Creatinine: 1.11 mg/dL (ref 0.61–1.24)
GFR, Est AFR Am: >60 mL/min (ref >60)
Glucose, Bld: 103 mg/dL — ABNORMAL HIGH (ref 70–99)
Potassium: 3.7 mmol/L (ref 3.5–5.1)
Sodium: 141 mmol/L (ref 135–145)
TOTAL PROTEIN: 7.7 g/dL (ref 6.5–8.1)

## 2017-11-12 LAB — SEDIMENTATION RATE: SED RATE: 22 mm/h — AB (ref 0–16)

## 2017-11-12 LAB — CBC WITH DIFFERENTIAL (CANCER CENTER ONLY)
Band Neutrophils: 0 %
Basophils Absolute: 0 10*3/uL (ref 0.0–0.1)
Basophils Relative: 0 %
Eosinophils Absolute: 0.1 10*3/uL (ref 0.0–0.5)
Eosinophils Relative: 1 %
HEMATOCRIT: 42.1 % (ref 39.0–52.0)
Hemoglobin: 13.5 g/dL (ref 13.0–17.0)
LYMPHS ABS: 1.6 10*3/uL (ref 0.7–4.0)
LYMPHS PCT: 16 %
MCH: 28.9 pg (ref 26.0–34.0)
MCHC: 32.1 g/dL (ref 30.0–36.0)
MCV: 90.1 fL (ref 80.0–100.0)
MONO ABS: 0.8 10*3/uL (ref 0.1–1.0)
Monocytes Relative: 8 %
NRBC: 0 % (ref 0.0–0.2)
Neutro Abs: 7.3 10*3/uL (ref 1.7–7.7)
Neutrophils Relative %: 74 %
Platelet Count: 244 10*3/uL (ref 150–400)
RBC: 4.67 MIL/uL (ref 4.22–5.81)
RDW: 12.4 % (ref 11.5–15.5)
WBC Count: 9.8 10*3/uL (ref 4.0–10.5)

## 2017-11-12 LAB — URIC ACID: URIC ACID, SERUM: 7.1 mg/dL (ref 3.7–8.6)

## 2017-11-12 LAB — SAVE SMEAR(SSMR), FOR PROVIDER SLIDE REVIEW

## 2017-11-12 LAB — LACTATE DEHYDROGENASE: LDH: 163 U/L (ref 98–192)

## 2017-11-12 NOTE — Progress Notes (Signed)
START ON PATHWAY REGIMEN - Lymphoma and CLL     A cycle is every 28 days:     Doxorubicin      Dacarbazine      Vinblastine      Bleomycin   **Always confirm dose/schedule in your pharmacy ordering system**  Patient Characteristics: Classical Hodgkin Lymphoma, First Line, Stage I / II, Early Favorable with  No Risk Factors  and No Bulk, Age < 60 Disease Type: Not Applicable Disease Type: Not Applicable Disease Type: Classical Hodgkin Lymphoma Line of therapy: First Line [place] Stage: IIA First Line, Stage I/II Disease Characteristics: Early Favorable with No Risk Factors and No Bulk Age: < 60 Intent of Therapy: Curative Intent, Discussed with Patient 

## 2017-11-12 NOTE — Telephone Encounter (Signed)
Printed patient avs to inform of next appts  Lab/MD Chemo edu on11/11 at 10 am, PET and PFT appts. IR will contact patient to sch port placement

## 2017-11-13 LAB — HEPATITIS B SURFACE ANTIGEN: Hepatitis B Surface Ag: NEGATIVE

## 2017-11-13 LAB — HEPATITIS B SURFACE ANTIBODY,QUALITATIVE: Hep B S Ab: NONREACTIVE

## 2017-11-13 LAB — HCV COMMENT:

## 2017-11-13 LAB — HEPATITIS B CORE ANTIBODY, TOTAL: HEP B C TOTAL AB: NEGATIVE

## 2017-11-13 LAB — HEPATITIS C ANTIBODY (REFLEX): HCV Ab: <0.1 s/co ratio (ref 0.0–0.9)

## 2017-11-15 ENCOUNTER — Telehealth: Payer: Self-pay | Admitting: *Deleted

## 2017-11-15 ENCOUNTER — Ambulatory Visit (HOSPITAL_COMMUNITY)
Admission: RE | Admit: 2017-11-15 | Discharge: 2017-11-15 | Disposition: A | Discharge status: Home / Self Care | Payer: Medicare Other | Source: Ambulatory Visit | Attending: Hematology | Admitting: Hematology

## 2017-11-15 ENCOUNTER — Other Ambulatory Visit: Payer: Self-pay

## 2017-11-15 ENCOUNTER — Ambulatory Visit (HOSPITAL_BASED_OUTPATIENT_CLINIC_OR_DEPARTMENT_OTHER): Payer: Medicare Other

## 2017-11-15 DIAGNOSIS — C8190 Hodgkin lymphoma, unspecified, unspecified site: Secondary | ICD-10-CM

## 2017-11-15 DIAGNOSIS — I2510 Atherosclerotic heart disease of native coronary artery without angina pectoris: Secondary | ICD-10-CM | POA: Insufficient documentation

## 2017-11-15 DIAGNOSIS — I2584 Coronary atherosclerosis due to calcified coronary lesion: Secondary | ICD-10-CM | POA: Diagnosis not present

## 2017-11-15 DIAGNOSIS — Z7982 Long term (current) use of aspirin: Secondary | ICD-10-CM | POA: Insufficient documentation

## 2017-11-15 DIAGNOSIS — Z01818 Encounter for other preprocedural examination: Secondary | ICD-10-CM | POA: Diagnosis not present

## 2017-11-15 DIAGNOSIS — E669 Obesity, unspecified: Secondary | ICD-10-CM | POA: Diagnosis not present

## 2017-11-15 DIAGNOSIS — I10 Essential (primary) hypertension: Secondary | ICD-10-CM | POA: Insufficient documentation

## 2017-11-15 DIAGNOSIS — Z79899 Other long term (current) drug therapy: Secondary | ICD-10-CM | POA: Diagnosis not present

## 2017-11-15 DIAGNOSIS — Z8249 Family history of ischemic heart disease and other diseases of the circulatory system: Secondary | ICD-10-CM | POA: Insufficient documentation

## 2017-11-15 LAB — PULMONARY FUNCTION TEST
DL/VA % PRED: 99 %
DL/VA: 4.66 ml/min/mmHg/L
DLCO COR: 29.27 ml/min/mmHg
DLCO cor % pred: 86 %
DLCO unc % pred: 84 %
DLCO unc: 28.32 ml/min/mmHg
FEF 25-75 POST: 5.06 L/s
FEF 25-75 Pre: 4.75 L/sec
FEF2575-%CHANGE-POST: 6 %
FEF2575-%Pred-Post: 179 %
FEF2575-%Pred-Pre: 168 %
FEV1-%Change-Post: 2 %
FEV1-%PRED-POST: 103 %
FEV1-%Pred-Pre: 100 %
FEV1-POST: 3.7 L
FEV1-Pre: 3.59 L
FEV1FVC-%CHANGE-POST: 0 %
FEV1FVC-%Pred-Pre: 115 %
FEV6-%Change-Post: 2 %
FEV6-%Pred-Post: 93 %
FEV6-%Pred-Pre: 91 %
FEV6-Post: 4.24 L
FEV6-Pre: 4.15 L
FEV6FVC-%PRED-PRE: 105 %
FEV6FVC-%Pred-Post: 105 %
FVC-%CHANGE-POST: 1 %
FVC-%Pred-Post: 88 %
FVC-%Pred-Pre: 87 %
FVC-PRE: 4.16 L
FVC-Post: 4.24 L
Post FEV1/FVC ratio: 87 %
Post FEV6/FVC ratio: 100 %
Pre FEV1/FVC ratio: 86 %
Pre FEV6/FVC Ratio: 100 %
RV % pred: 92 %
RV: 2.21 L
TLC % pred: 93 %
TLC: 6.72 L

## 2017-11-15 MED ORDER — ALBUTEROL SULFATE (2.5 MG/3ML) 0.083% IN NEBU
2.5000 mg | INHALATION_SOLUTION | Freq: Once | RESPIRATORY_TRACT | Status: AC
  Administered 2017-11-15: 2.5 mg via RESPIRATORY_TRACT

## 2017-11-15 NOTE — Telephone Encounter (Signed)
Pt requesting letter regarding new diagnosis as he is to be on a cruise Jan 25 2018. Letter mailed to pt per pt request.

## 2017-11-19 ENCOUNTER — Other Ambulatory Visit: Payer: Self-pay | Admitting: Radiology

## 2017-11-20 ENCOUNTER — Ambulatory Visit (HOSPITAL_COMMUNITY)
Admission: RE | Admit: 2017-11-20 | Discharge: 2017-11-20 | Disposition: A | Discharge status: Home / Self Care | Payer: Medicare Other | Source: Ambulatory Visit | Attending: Hematology | Admitting: Hematology

## 2017-11-20 ENCOUNTER — Encounter (HOSPITAL_COMMUNITY): Payer: Self-pay

## 2017-11-20 ENCOUNTER — Other Ambulatory Visit: Payer: Self-pay

## 2017-11-20 DIAGNOSIS — Z8249 Family history of ischemic heart disease and other diseases of the circulatory system: Secondary | ICD-10-CM | POA: Diagnosis not present

## 2017-11-20 DIAGNOSIS — I10 Essential (primary) hypertension: Secondary | ICD-10-CM | POA: Diagnosis not present

## 2017-11-20 DIAGNOSIS — Z79899 Other long term (current) drug therapy: Secondary | ICD-10-CM | POA: Diagnosis not present

## 2017-11-20 DIAGNOSIS — Z6836 Body mass index (BMI) 36.0-36.9, adult: Secondary | ICD-10-CM | POA: Diagnosis not present

## 2017-11-20 DIAGNOSIS — E669 Obesity, unspecified: Secondary | ICD-10-CM | POA: Insufficient documentation

## 2017-11-20 DIAGNOSIS — Z7982 Long term (current) use of aspirin: Secondary | ICD-10-CM | POA: Diagnosis not present

## 2017-11-20 DIAGNOSIS — Z5111 Encounter for antineoplastic chemotherapy: Secondary | ICD-10-CM | POA: Diagnosis not present

## 2017-11-20 DIAGNOSIS — R590 Localized enlarged lymph nodes: Secondary | ICD-10-CM | POA: Insufficient documentation

## 2017-11-20 DIAGNOSIS — C8190 Hodgkin lymphoma, unspecified, unspecified site: Secondary | ICD-10-CM

## 2017-11-20 HISTORY — PX: IR IMAGING GUIDED PORT INSERTION: IMG5740

## 2017-11-20 LAB — PROTIME-INR
INR: 1.08
Prothrombin Time: 13.9 seconds (ref 11.4–15.2)

## 2017-11-20 LAB — CBC
HCT: 42.5 % (ref 39.0–52.0)
Hemoglobin: 14 g/dL (ref 13.0–17.0)
MCH: 29.4 pg (ref 26.0–34.0)
MCHC: 32.9 g/dL (ref 30.0–36.0)
MCV: 89.1 fL (ref 80.0–100.0)
PLATELETS: 249 10*3/uL (ref 150–400)
RBC: 4.77 MIL/uL (ref 4.22–5.81)
RDW: 12.2 % (ref 11.5–15.5)
WBC: 10.4 10*3/uL (ref 4.0–10.5)
nRBC: 0 % (ref 0.0–0.2)

## 2017-11-20 MED ORDER — CEFAZOLIN SODIUM-DEXTROSE 2-4 GM/100ML-% IV SOLN
2.0000 g | Freq: Once | INTRAVENOUS | Status: AC
  Administered 2017-11-20: 2 g via INTRAVENOUS

## 2017-11-20 MED ORDER — FENTANYL CITRATE (PF) 100 MCG/2ML IJ SOLN
INTRAMUSCULAR | Status: AC
  Filled 2017-11-20: qty 2

## 2017-11-20 MED ORDER — LIDOCAINE-EPINEPHRINE (PF) 2 %-1:200000 IJ SOLN
INTRAMUSCULAR | Status: AC | PRN
  Administered 2017-11-20: 10 mL

## 2017-11-20 MED ORDER — LIDOCAINE-EPINEPHRINE (PF) 2 %-1:200000 IJ SOLN
INTRAMUSCULAR | Status: AC
  Filled 2017-11-20: qty 20

## 2017-11-20 MED ORDER — FENTANYL CITRATE (PF) 100 MCG/2ML IJ SOLN
INTRAMUSCULAR | Status: AC | PRN
  Administered 2017-11-20 (×2): 50 ug via INTRAVENOUS

## 2017-11-20 MED ORDER — HEPARIN SOD (PORK) LOCK FLUSH 100 UNIT/ML IV SOLN
INTRAVENOUS | Status: AC | PRN
  Administered 2017-11-20: 500 [IU] via INTRAVENOUS

## 2017-11-20 MED ORDER — CEFAZOLIN SODIUM-DEXTROSE 2-4 GM/100ML-% IV SOLN
INTRAVENOUS | Status: AC
  Administered 2017-11-20: 2 g via INTRAVENOUS
  Filled 2017-11-20: qty 100

## 2017-11-20 MED ORDER — MIDAZOLAM HCL 2 MG/2ML IJ SOLN
INTRAMUSCULAR | Status: AC
  Filled 2017-11-20: qty 4

## 2017-11-20 MED ORDER — IOPAMIDOL (ISOVUE-300) INJECTION 61%
INTRAVENOUS | Status: AC
  Filled 2017-11-20: qty 50

## 2017-11-20 MED ORDER — LIDOCAINE HCL 1 % IJ SOLN
INTRAMUSCULAR | Status: AC
  Filled 2017-11-20: qty 20

## 2017-11-20 MED ORDER — MIDAZOLAM HCL 2 MG/2ML IJ SOLN
INTRAMUSCULAR | Status: AC | PRN
  Administered 2017-11-20 (×4): 1 mg via INTRAVENOUS

## 2017-11-20 MED ORDER — HEPARIN SOD (PORK) LOCK FLUSH 100 UNIT/ML IV SOLN
INTRAVENOUS | Status: AC
  Filled 2017-11-20: qty 5

## 2017-11-20 MED ORDER — SODIUM CHLORIDE 0.9 % IV SOLN
INTRAVENOUS | Status: DC
  Administered 2017-11-20: 14:00:00 via INTRAVENOUS

## 2017-11-20 NOTE — Discharge Instructions (Signed)
Implanted Port Insertion, Care After °This sheet gives you information about how to care for yourself after your procedure. Your health care provider may also give you more specific instructions. If you have problems or questions, contact your health care provider. °What can I expect after the procedure? °After your procedure, it is common to have: °· Discomfort at the port insertion site. °· Bruising on the skin over the port. This should improve over 3-4 days. ° °Follow these instructions at home: °Port care °· After your port is placed, you will get a manufacturer's information card. The card has information about your port. Keep this card with you at all times. °· Take care of the port as told by your health care provider. Ask your health care provider if you or a family member can get training for taking care of the port at home. A home health care nurse may also take care of the port. °· Make sure to remember what type of port you have. °Incision care °· Follow instructions from your health care provider about how to take care of your port insertion site. Make sure you: °? Wash your hands with soap and water before you change your bandage (dressing). If soap and water are not available, use hand sanitizer. °? Change your dressing as told by your health care provider. °? Leave stitches (sutures), skin glue, or adhesive strips in place. These skin closures may need to stay in place for 2 weeks or longer. If adhesive strip edges start to loosen and curl up, you may trim the loose edges. Do not remove adhesive strips completely unless your health care provider tells you to do that. °· Check your port insertion site every day for signs of infection. Check for: °? More redness, swelling, or pain. °? More fluid or blood. °? Warmth. °? Pus or a bad smell. °General instructions °· Do not take baths, swim, or use a hot tub until your health care provider approves. °· Do not lift anything that is heavier than 10 lb (4.5  kg) for a week, or as told by your health care provider. °· Ask your health care provider when it is okay to: °? Return to work or school. °? Resume usual physical activities or sports. °· Do not drive for 24 hours if you were given a medicine to help you relax (sedative). °· Take over-the-counter and prescription medicines only as told by your health care provider. °· Wear a medical alert bracelet in case of an emergency. This will tell any health care providers that you have a port. °· Keep all follow-up visits as told by your health care provider. This is important. °Contact a health care provider if: °· You cannot flush your port with saline as directed, or you cannot draw blood from the port. °· You have a fever or chills. °· You have more redness, swelling, or pain around your port insertion site. °· You have more fluid or blood coming from your port insertion site. °· Your port insertion site feels warm to the touch. °· You have pus or a bad smell coming from the port insertion site. °Get help right away if: °· You have chest pain or shortness of breath. °· You have bleeding from your port that you cannot control. °Summary °· Take care of the port as told by your health care provider. °· Change your dressing as told by your health care provider. °· Keep all follow-up visits as told by your health care provider. °  This information is not intended to replace advice given to you by your health care provider. Make sure you discuss any questions you have with your health care provider. °Document Released: 10/22/2012 Document Revised: 11/23/2015 Document Reviewed: 11/23/2015 °Elsevier Interactive Patient Education © 2017 [employer] °Implanted Port Home Guide °An implanted port is a type of central line that is placed under the skin. Central lines are used to provide IV access when treatment or nutrition needs to be given through a person’s veins. Implanted ports are used for long-term IV access. An implanted port  may be placed because: °· You need IV medicine that would be irritating to the small veins in your hands or arms. °· You need long-term IV medicines, such as antibiotics. °· You need IV nutrition for a long period. °· You need frequent blood draws for lab tests. °· You need dialysis. ° °Implanted ports are usually placed in the chest area, but they can also be placed in the upper arm, the abdomen, or the leg. An implanted port has two main parts: °· Reservoir. The reservoir is round and will appear as a small, raised area under your skin. The reservoir is the part where a needle is inserted to give medicines or draw blood. °· Catheter. The catheter is a thin, flexible tube that extends from the reservoir. The catheter is placed into a large vein. Medicine that is inserted into the reservoir goes into the catheter and then into the vein. ° °How will I care for my incision site? °Do not get the incision site wet. Bathe or shower as directed by your health care provider. °How is my port accessed? °Special steps must be taken to access the port: °· Before the port is accessed, a numbing cream can be placed on the skin. This helps numb the skin over the port site. °· Your health care provider uses a sterile technique to access the port. °? Your health care provider must put on a mask and sterile gloves. °? The skin over your port is cleaned carefully with an antiseptic and allowed to dry. °? The port is gently pinched between sterile gloves, and a needle is inserted into the port. °· Only "non-coring" port needles should be used to access the port. Once the port is accessed, a blood return should be checked. This helps ensure that the port is in the vein and is not clogged. °· If your port needs to remain accessed for a constant infusion, a clear (transparent) bandage will be placed over the needle site. The bandage and needle will need to be changed every week, or as directed by your health care provider. °· Keep the  bandage covering the needle clean and dry. Do not get it wet. Follow your health care provider’s instructions on how to take a shower or bath while the port is accessed. °· If your port does not need to stay accessed, no bandage is needed over the port. ° °What is flushing? °Flushing helps keep the port from getting clogged. Follow your health care provider’s instructions on how and when to flush the port. Ports are usually flushed with saline solution or a medicine called heparin. The need for flushing will depend on how the port is used. °· If the port is used for intermittent medicines or blood draws, the port will need to be flushed: °? After medicines have been given. °? After blood has been drawn. °? As part of routine maintenance. °· If a constant infusion is   running, the port may not need to be flushed. ° °How long will my port stay implanted? °The port can stay in for as long as your health care provider thinks it is needed. When it is time for the port to come out, surgery will be done to remove it. The procedure is similar to the one performed when the port was put in. °When should I seek immediate medical care? °When you have an implanted port, you should seek immediate medical care if: °· You notice a bad smell coming from the incision site. °· You have swelling, redness, or drainage at the incision site. °· You have more swelling or pain at the port site or the surrounding area. °· You have a fever that is not controlled with medicine. ° °This information is not intended to replace advice given to you by your health care provider. Make sure you discuss any questions you have with your health care provider. °Document Released: 01/01/2005 Document Revised: 06/09/2015 Document Reviewed: 09/08/2012 °Elsevier Interactive Patient Education © 2017 [employer] °Moderate Conscious Sedation, Adult, Care After °These instructions provide you with information about caring for yourself after your procedure. Your  health care provider may also give you more specific instructions. Your treatment has been planned according to current medical practices, but problems sometimes occur. Call your health care provider if you have any problems or questions after your procedure. °What can I expect after the procedure? °After your procedure, it is common: °· To feel sleepy for several hours. °· To feel clumsy and have poor balance for several hours. °· To have poor judgment for several hours. °· To vomit if you eat too soon. ° °Follow these instructions at home: °For at least 24 hours after the procedure: ° °· Do not: °? Participate in activities where you could fall or become injured. °? Drive. °? Use heavy machinery. °? Drink alcohol. °? Take sleeping pills or medicines that cause drowsiness. °? Make important decisions or sign legal documents. °? Take care of children on your own. °· Rest. °Eating and drinking °· Follow the diet recommended by your health care provider. °· If you vomit: °? Drink water, juice, or soup when you can drink without vomiting. °? Make sure you have little or no nausea before eating solid foods. °General instructions °· Have a responsible adult stay with you until you are awake and alert. °· Take over-the-counter and prescription medicines only as told by your health care provider. °· If you smoke, do not smoke without supervision. °· Keep all follow-up visits as told by your health care provider. This is important. °Contact a health care provider if: °· You keep feeling nauseous or you keep vomiting. °· You feel light-headed. °· You develop a rash. °· You have a fever. °Get help right away if: °· You have trouble breathing. °This information is not intended to replace advice given to you by your health care provider. Make sure you discuss any questions you have with your health care provider. °Document Released: 10/22/2012 Document Revised: 06/06/2015 Document Reviewed: 04/23/2015 °Elsevier Interactive  Patient Education © 2018 Else[employer] Inc. ° °

## 2017-11-20 NOTE — Procedures (Signed)
Placement of left IJ port.  Tip at SVC/RA junction.  Minimal blood loss and no immediate complication.   

## 2017-11-20 NOTE — H&P (Signed)
Chief Complaint: Patient was seen in consultation today for port-a-cath placement.  Referring Physician(s): Zhao,Yan  Supervising Physician: Markus Daft  Patient Status: [facility] - Out-pt  History of Present Illness: Bradley Novak is a 67 y.o. male with a past medical history significant for HTN and recently diagnosed Hodgkin lymphoma. He developed a persistent cough July 2019 without any other s/s and then August 2019 he noted an enlarged right cervical lymph node - he presented to his PCP and was prescribed antibiotics which improved his symptoms and lymphadenopathy initially however the lymphadenopathy subsequently returned. CT imaging of the neck, chest and abdomen were performed on 10/07/17 which showed enlarged nodes in the right jugular chain, posterior triangle and supraclavicular fossa. He then underwent core biopsy of the right supraclavicular lymph node with Dr. Anselm Pancoast on 10/25/17. Pathology from this biopsy was positive for classic Hodgkin lymphoma. He has not yet had a PET scan. He is planned to start chemotherapy on 11/26/17 - request has been made for port placement in IR by Dr. Maylon Peppers.  Patient reports that he feels fine and has no complaints other than he will be missing his anniversary cruise this year due to chemo. He states he is a little nervous about having this procedure done because this is the first time he has ever been in a hospital as a patient and the first time he has had any procedure. He is looking forward to starting chemotherapy so that his life can return to normal.  Past Medical History:  Diagnosis Date  . Hypertension 2006  . Obesity   . Wears glasses     No past surgical history on file.  Allergies: Patient has no known allergies.  Medications: Prior to Admission medications   Medication Sig Start Date End Date Taking? Authorizing Provider  aspirin EC 81 MG tablet Take 1 tablet (81 mg total) by mouth daily. 08/30/17   Tysinger, Camelia Eng, PA-C    atorvastatin (LIPITOR) 20 MG tablet TAKE 1 TABLET BY MOUTH DAILY 09/02/17   Tysinger, Camelia Eng, PA-C  carvedilol (COREG) 6.25 MG tablet Take 1 tablet (6.25 mg total) by mouth 2 (two) times daily. 11/11/17   Nahser, Wonda Cheng, MD  Cholecalciferol (VITAMIN D PO) Take 1 capsule by mouth at bedtime.    [provider]  diclofenac (VOLTAREN) 75 MG EC tablet Take 75 mg by mouth daily.    [provider]  ibuprofen (ADVIL,MOTRIN) 200 MG tablet Take 400 mg by mouth daily as needed for moderate pain.    [provider]  losartan (COZAAR) 100 MG tablet Take 1 tablet (100 mg total) by mouth daily. 11/11/17   Nahser, Wonda Cheng, MD  ranitidine (ZANTAC) 150 MG tablet Take 150 mg by mouth daily as needed for heartburn.    [provider]     Family History  Problem Relation Age of Onset  . Hypertension Father   . Hypertension Brother   . Hypertension Brother   . Hypertension Brother   . Hypertension Brother     Social History   Socioeconomic History  . Marital status: Married    Spouse name: Not on file  . Number of children: Not on file  . Years of education: Not on file  . Highest education level: Not on file  Occupational History  . Not on file  Social Needs  . Financial resource strain: Not on file  . Food insecurity:    Worry: Not on file    Inability: Not on  file  . Transportation needs:    Medical: Not on file    Non-medical: Not on file  Tobacco Use  . Smoking status: Never Smoker  . Smokeless tobacco: Never Used  Substance and Sexual Activity  . Alcohol use: No    Alcohol/week: 0.0 standard drinks  . Drug use: No  . Sexual activity: Not on file  Lifestyle  . Physical activity:    Days per week: Not on file    Minutes per session: Not on file  . Stress: Not on file  Relationships  . Social connections:    Talks on phone: Not on file    Gets together: Not on file    Attends religious service: Not on file    Active member of club or  organization: Not on file    Attends meetings of clubs or organizations: Not on file    Relationship status: Not on file  Other Topics Concern  . 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  All other systems are negative.  Review of Systems  Constitutional: Negative for appetite change, chills and fever.  HENT: Negative for trouble swallowing.   Respiratory: Negative for cough and shortness of breath.   Cardiovascular: Negative for chest pain.  Gastrointestinal: Negative for abdominal distention, abdominal pain, blood in stool, constipation, diarrhea, nausea and vomiting.  Genitourinary: Negative for hematuria.  Musculoskeletal: Negative for back pain and myalgias.  Skin: Negative for rash.  Neurological: Negative for dizziness, syncope and light-headedness.  Hematological: Positive for adenopathy.  Psychiatric/Behavioral: Negative for confusion.    Vital Signs: BP (!) 155/95   Pulse 60   Temp 98.5 F (36.9 C) (Oral)   Resp 18   Ht 5\' 11"  (1.803 m)   Wt 259 lb (117.5 kg)   SpO2 99%   BMI 36.12 kg/m   Physical Exam  Constitutional: He is oriented to person, place, and time. No distress.  Wife at bedside. Cheerful, talkative.   HENT:  Head: Normocephalic.  Cardiovascular: Normal rate, regular rhythm and normal heart sounds.  Pulmonary/Chest: Effort normal and breath sounds normal.  Abdominal: Soft. He exhibits no distension. There is no tenderness.  Lymphadenopathy:    He has cervical adenopathy (Right sided).  Neurological: He is alert and oriented to person, place, and time.  Skin: Skin is warm and dry. No rash noted. He is not diaphoretic.  Psychiatric: He has a normal mood and affect. His behavior is normal.  Judgment and thought content normal.  Vitals reviewed.    MD Evaluation Airway: WNL Heart: WNL Abdomen: WNL Chest/ Lungs: WNL ASA  Classification: 3 Mallampati/Airway Score: One   Imaging: [place] Core Biopsy (lymph Nodes)  Result Date: 10/25/2017 INDICATION: 67 year old with right cervical lymphadenopathy. Tissue diagnosis is needed. Concern for lymphoma. EXAM: ULTRASOUND-GUIDED RIGHT CERVICAL LYMPH NODE BIOPSY MEDICATIONS: None. ANESTHESIA/SEDATION: None FLUOROSCOPY TIME:  None COMPLICATIONS: None immediate. PROCEDURE: Informed written consent was obtained from the patient after a thorough discussion of the procedural risks, benefits and alternatives. All questions were addressed. A timeout was performed prior to the initiation of the procedure. Right side of the neck was evaluated with ultrasound. Multiple enlarged hypoechoic lymph nodes were identified. A lymph node in the right supraclavicular  area was targeted for biopsy. The neck was prepped with chlorhexidine and a sterile field was created. Skin and soft tissues anesthetized with 1% lidocaine. Using ultrasound guidance, core biopsies were obtained from the neck lymph node with an 18 gauge device. Specimens placed in saline. Total of 7 core biopsies obtained. Bandage placed over the puncture site. FINDINGS: Numerous hypoechoic lymph nodes scattered throughout the right side of the neck. 7 core biopsies obtained from a right cervical lymph node. Minimal bleeding around the lymph node at the end of the procedure. IMPRESSION: Successful ultrasound-guided core biopsies of a right supraclavicular lymph node. Electronically Signed   By: Markus Daft M.D.   On: 10/25/2017 20:16    Labs:  CBC: Recent Labs    08/29/17 1155 10/25/17 1226 11/12/17 0931 11/20/17 1334  WBC 8.0 9.0 9.8 10.4  HGB 14.5 14.3 13.5 14.0  HCT 42.0 42.4 42.1 42.5  PLT 237 222 244 249    COAGS: Recent Labs    10/25/17 1226 11/20/17 1334  INR 1.10 1.08     BMP: Recent Labs    08/29/17 1155 10/25/17 1226 11/12/17 0931  NA 141 136 141  K 4.3 4.1 3.7  CL 103 106 103  CO2 24 23 26   GLUCOSE 93 94 103*  BUN 16 19 21   CALCIUM 9.2 9.0 9.3  CREATININE 1.03 1.01 1.11  GFRNONAA 76 >60 >60  GFRAA 88 >60 >60    LIVER FUNCTION TESTS: Recent Labs    08/29/17 1155 11/12/17 0931  BILITOT 0.6 0.6  AST 19 28  ALT 24 32  ALKPHOS 124* 106  PROT 7.4 7.7  ALBUMIN 4.1 3.6    TUMOR MARKERS: No results for input(s): AFPTM, CEA, CA199, CHROMGRNA in the last 8760 hours.  Assessment and Plan:  Newly diagnosed Hodgkin lymphoma followed by Dr. Maylon Peppers - planned for chemotherapy to start 11/26/17. Request to IR for port placement for which he presents today.  Patient is afebrile, NPO since last night aside from a sip of water with medication this morning, he does not take blood thinning medications. WBC 10.4, H/H 14.0/42.5, INR 1.08. He understands the indication for procedure and wishes to proceed.  Risks and benefits of image guided port-a-catheter placement was discussed with the patient including, but not limited to bleeding, infection, pneumothorax, or fibrin sheath development and need for additional procedures.  All of the patient's questions were answered, patient is agreeable to proceed.  Consent signed and in chart.  Thank you for this interesting consult.  I greatly enjoyed meeting Edrees Adnan Vanvoorhis and look forward to participating in their care.  A copy of this report was sent to the requesting provider on this date.  Electronically Signed: Joaquim Nam, PA-C 11/20/2017, 1:05 PM   I spent a total of  15 Minutes in face to face in clinical consultation, greater than 50% of which was counseling/coordinating care for port placement.

## 2017-11-21 ENCOUNTER — Encounter (HOSPITAL_COMMUNITY)
Admission: RE | Admit: 2017-11-21 | Discharge: 2017-11-21 | Disposition: A | Discharge status: Home / Self Care | Payer: Medicare Other | Source: Ambulatory Visit | Attending: Hematology | Admitting: Hematology

## 2017-11-21 ENCOUNTER — Other Ambulatory Visit: Payer: Self-pay | Admitting: Hematology

## 2017-11-21 DIAGNOSIS — C8190 Hodgkin lymphoma, unspecified, unspecified site: Secondary | ICD-10-CM | POA: Insufficient documentation

## 2017-11-21 LAB — GLUCOSE, CAPILLARY: Glucose-Capillary: 102 mg/dL — ABNORMAL HIGH (ref 70–99)

## 2017-11-21 MED ORDER — FLUDEOXYGLUCOSE F - 18 (FDG) INJECTION
12.2000 | Freq: Once | INTRAVENOUS | Status: AC | PRN
  Administered 2017-11-21: 12.2 via INTRAVENOUS

## 2017-11-21 NOTE — Progress Notes (Signed)
[facility] OFFICE PROGRESS NOTE  Patient Care Team: Tysinger, Camelia Eng, PA-C as PCP - General (Family Medicine) Nahser, Wonda Cheng, MD as PCP - Cardiology (Cardiology)  HEME/ONC OVERVIEW: 1. Stage III classic Hodgkin lymphoma, subtype NOS due to limited specimen -Bx-proven R cervical LN involvement in 09/2017; PET in 11/2017 showed cervical, mediastinal and upper abdominal LN involement; treatment delayed due to pt traveling on a cruise after diagnosis -Treatment: 1st line ABVD, 11/26/2017 - present   2. Port placement in 11/2017   ASSESSMENT & PLAN:  Stage III classic Hodgkin lymphoma, favorable risk  -I independently reviewed the radiologic images of recent PET scan, and agree with findings as documented; in summary, PET showed FDG-avid LN disease involving the right cervical, mediastinal and upper abdominal LN's -I discussed the case with Dr. Leonia Reeves of radiology, who confirmed FDG avidity in the upper abdominal lymph nodes; therefore, the patient has Stage III Hodgkin lymphoma -I reviewed the PET scan results with the patient and his wife, and discussed the treatment implications and prognosis -Pre-chemo evaluation, include echocardiogram and PFT, showed adequate cardiac and pulmonary functions to proceed with ABVD -Labs appropriate today, proceed with C1D1 on 11/26/2017 -We will plan to repeat PET scan after 2 cycles to assess disease response; if Deauville score 1-3, then we can de-escalate to AVD x 4 cycles  -PRN anti-emetics: Zofran, Compazine, dexamethasone and Ativan -The patient was counseled on any concerning symptoms, such as fever (temperature > 100.4), for which he should contact the clinic promptly  Hoarseness of voice -No clear etiology; PET scan did not review any abnormality in the larynx -We will continue to monitor for now  [facility] placement [facility] site intact with no erythema or tenderness  Orders Placed This Encounter  Procedures  . PHYSICIAN  COMMUNICATION ORDER    Baseline Pulmonary Function Tests should be obtained prior to initiation of Bleomycin.  Marland Kitchen PHYSICIAN COMMUNICATION ORDER    A baseline Echo/ [place] should be obtained prior to initiation of Anthracycline Chemotherapy   All questions were answered. The patient knows to call the clinic with any problems, questions or concerns. No barriers to learning was detected.  A total of more than 25 minutes were spent face-to-face with the patient during this encounter and over half of that time was spent on counseling and coordination of care as outlined above.   [name], MD 11/25/2017 11:27 AM  CHIEF COMPLAINT: "I am here for labs"  INTERVAL HISTORY: Bradley Novak to clinic for labs and follow-up prior to cycle 1 of ABVD.  Patient reports that he has had some intermittent hoarseness of voice, usually in the afternoon, as well as mild muscle tension across his shoulders and neck.  He also reports persistent fatigue, which is the most bothersome symptom at this time.  The lymphadenopathy in the right neck has not changed.  He denies any fever, chill, night sweats, chest pain, dyspnea, or palpitations.  SUMMARY OF ONCOLOGIC HISTORY:   Hodgkin lymphoma ([place])   08/29/2017 Initial Diagnosis    Hodgkin lymphoma ([place])    10/07/2017 Imaging    CT neck w/ contrast: Enlarged nodes in the right jugular chain, posterior triangle, and supraclavicular fossa. The pattern is most concerning for lymphoma; multiple nodes are superficial and amenable to biopsy.  CT CAP w/ contrast: 1. Right supraclavicular and subcarinal adenopathy is identified. Differential considerations include lymphoproliferative disorder, metastatic adenopathy, granulomatous inflammation/infection or reactive adenopathy. Consider further evaluation with tissue sampling. 2. No acute findings, mass or  adenopathy within the abdomen. 3. Lungs are clear.  No evidence for pneumonia.  4.  Aortic Atherosclerosis (ICD10-I70.0).     10/25/2017 Procedure    US-guided R cervical LN biopsy    10/25/2017 Pathology Results    (Accession: [id number]) Lymph node, needle/core biopsy, Right Cervical - CLASSIC HODGKIN LYMPHOMA - SEE COMMENT - CD30 (1%) - Unable to subtype due to limited sample specimen    11/12/2017 Cancer Staging    Staging form: Hodgkin and Non-Hodgkin Lymphoma, AJCC 8th Edition - Clinical stage from 11/12/2017: Stage II (Hodgkin lymphoma, A - Asymptomatic) - Signed by [name], MD on 11/12/2017    11/21/2017 -  Chemotherapy    The patient had DOXOrubicin (ADRIAMYCIN) chemo injection 60 mg, 25 mg/m2 = 60 mg, Intravenous,  Once, 0 of 4 cycles palonosetron (ALOXI) injection 0.25 mg, 0.25 mg, Intravenous,  Once, 0 of 4 cycles bleomycin (BLEOCIN) 24 Units in sodium chloride 0.9 % 50 mL chemo infusion, 10 Units/m2 = 24 Units, Intravenous,  Once, 0 of 4 cycles dacarbazine (DTIC) 910 mg in sodium chloride 0.9 % 250 mL chemo infusion, 375 mg/m2 = 910 mg, Intravenous,  Once, 0 of 4 cycles vinBLAStine (VELBAN) 14.6 mg in sodium chloride 0.9 % 50 mL chemo infusion, 6 mg/m2 = 14.6 mg, Intravenous, Once, 0 of 4 cycles fosaprepitant (EMEND) 150 mg, dexamethasone (DECADRON) 12 mg in sodium chloride 0.9 % 145 mL IVPB, , Intravenous,  Once, 0 of 4 cycles  for chemotherapy treatment.     11/21/2017 Imaging    PET: IMPRESSION: 1. Multiple relatively small hypermetabolic lymph nodes in the RIGHT neck, mediastinum and limited involvement of the upper abdomen. Lymph node activity is high (greater than liver, Deauville 4). 2. The most intense enlarged lymph node is anterior to the sternocleidomastoid muscle on the RIGHT. There are multiple assessable lymph nodes in the RIGHT cervical chain. 3. Minimal hypermetabolic adenopathy in the upper abdomen. No pelvic or inguinal metastatic adenopathy. 4. Normal spleen and bone marrow. 5. Single focus of metabolic activity in the sigmoid colon. Recommend screening colonoscopy if  patient is not current for such.     REVIEW OF SYSTEMS:   Constitutional: ( - ) fevers, ( - )  chills , ( - ) night sweats Eyes: ( - ) blurriness of vision, ( - ) double vision, ( - ) watery eyes Ears, nose, mouth, throat, and face: ( - ) mucositis, ( - ) sore throat Respiratory: ( - ) cough, ( - ) dyspnea, ( - ) wheezes Cardiovascular: ( - ) palpitation, ( - ) chest discomfort, ( - ) lower extremity swelling Gastrointestinal:  ( - ) nausea, ( - ) heartburn, ( - ) change in bowel habits Skin: ( - ) abnormal skin rashes Lymphatics: ( - ) easy bruising Neurological: ( - ) numbness, ( - ) tingling, ( - ) new weaknesses Behavioral/Psych: ( - ) mood change, ( - ) new changes  All other systems were reviewed with the patient and are negative.  I have reviewed the past medical history, past surgical history, social history and family history with the patient and they are unchanged from previous note.  ALLERGIES:  has No Known Allergies.  MEDICATIONS:  Current Outpatient Medications  Medication Sig Dispense Refill  . aspirin EC 81 MG tablet Take 1 tablet (81 mg total) by mouth daily. 90 tablet 3  . atorvastatin (LIPITOR) 20 MG tablet TAKE 1 TABLET BY MOUTH DAILY 90 tablet 2  . carvedilol (COREG) 6.25 MG  tablet Take 1 tablet (6.25 mg total) by mouth 2 (two) times daily. 180 tablet 3  . Cholecalciferol (VITAMIN D PO) Take 1 capsule by mouth at bedtime.    . diclofenac (VOLTAREN) 75 MG EC tablet Take 75 mg by mouth daily.    Marland Kitchen ibuprofen (ADVIL,MOTRIN) 200 MG tablet Take 400 mg by mouth daily as needed for moderate pain.    Marland Kitchen losartan (COZAAR) 100 MG tablet Take 1 tablet (100 mg total) by mouth daily. 90 tablet 3  . ranitidine (ZANTAC) 150 MG tablet Take 150 mg by mouth daily as needed for heartburn.    . dexamethasone (DECADRON) 4 MG tablet Take 2 tablets by mouth once a day on the day after chemotherapy and then take 2 tablets two times a day for 2 days. Take with food. 30 tablet 1  .  lidocaine-prilocaine (EMLA) cream Apply to affected area once 30 g 3  . LORazepam (ATIVAN) 0.5 MG tablet Take 1 tablet (0.5 mg total) by mouth every 6 (six) hours as needed (Nausea or vomiting). 30 tablet 0  . ondansetron (ZOFRAN) 8 MG tablet Take 1 tablet (8 mg total) by mouth 2 (two) times daily as needed. Start on the third day after chemotherapy. 30 tablet 1  . prochlorperazine (COMPAZINE) 10 MG tablet Take 1 tablet (10 mg total) by mouth every 6 (six) hours as needed (Nausea or vomiting). 30 tablet 1   No current facility-administered medications for this visit.     PHYSICAL EXAMINATION: ECOG PERFORMANCE STATUS: 0 - Asymptomatic  Today's Vitals   11/25/17 1043 11/25/17 1045  BP: 138/84   Pulse: 76   Resp: 17   Temp: 98.2 F (36.8 C)   TempSrc: Oral   SpO2: 100%   PainSc: 0-No pain 0-No pain   There is no height or weight on file to calculate BMI.  There were no vitals filed for this visit.  GENERAL: alert, no distress and comfortable SKIN: skin color, texture, turgor are normal, no rashes or significant lesions EYES: conjunctiva are pink and non-injected, sclera clear OROPHARYNX: no exudate, no erythema; lips, buccal mucosa, and tongue normal  NECK: supple, non-tender LYMPH:  no palpable lymphadenopathy in the cervical or axillary  LUNGS: clear to auscultation and percussion with normal breathing effort HEART: regular rate & rhythm and no murmurs and no lower extremity edema ABDOMEN: soft, non-tender, non-distended, normal bowel sounds Musculoskeletal: no cyanosis of digits and no clubbing  PSYCH: alert & oriented x 3, fluent speech NEURO: no focal motor/sensory deficits PORT: site healing well without any erythema or tenderness   LABORATORY DATA:  I have reviewed the data as listed    Component Value Date/Time   NA 141 11/12/2017 0931   NA 141 08/29/2017 1155   K 3.7 11/12/2017 0931   CL 103 11/12/2017 0931   CO2 26 11/12/2017 0931   GLUCOSE 103 (H) 11/12/2017  0931   BUN 21 11/12/2017 0931   BUN 16 08/29/2017 1155   CREATININE 1.11 11/12/2017 0931   CREATININE 0.83 09/27/2014 0001   CALCIUM 9.3 11/12/2017 0931   PROT 7.7 11/12/2017 0931   PROT 7.4 08/29/2017 1155   ALBUMIN 3.6 11/12/2017 0931   ALBUMIN 4.1 08/29/2017 1155   AST 28 11/12/2017 0931   ALT 32 11/12/2017 0931   ALKPHOS 106 11/12/2017 0931   BILITOT 0.6 11/12/2017 0931   GFRNONAA >60 11/12/2017 0931   GFRAA >60 11/12/2017 0931    No results found for: SPEP, UPEP  Lab Results  Component Value Date   WBC 8.8 11/25/2017   NEUTROABS 6.6 11/25/2017   HGB 13.2 11/25/2017   HCT 40.5 11/25/2017   MCV 88.6 11/25/2017   PLT 224 11/25/2017      Chemistry      Component Value Date/Time   NA 141 11/12/2017 0931   NA 141 08/29/2017 1155   K 3.7 11/12/2017 0931   CL 103 11/12/2017 0931   CO2 26 11/12/2017 0931   BUN 21 11/12/2017 0931   BUN 16 08/29/2017 1155   CREATININE 1.11 11/12/2017 0931   CREATININE 0.83 09/27/2014 0001      Component Value Date/Time   CALCIUM 9.3 11/12/2017 0931   ALKPHOS 106 11/12/2017 0931   AST 28 11/12/2017 0931   ALT 32 11/12/2017 0931   BILITOT 0.6 11/12/2017 0931       RADIOGRAPHIC STUDIES: I have personally reviewed the radiological images as listed below and agreed with the findings in the report. Nm Pet Image Initial (pi) Skull Base To Thigh  Result Date: 11/21/2017 CLINICAL DATA:  Initial treatment strategy for Hodgkin's lymphoma. EXAM: NUCLEAR MEDICINE PET SKULL BASE TO THIGH TECHNIQUE: 12.2 mCi F-18 FDG was injected intravenously. Full-ring PET imaging was performed from the skull base to thigh after the radiotracer. CT data was obtained and used for attenuation correction and anatomic localization. Fasting blood glucose: 102 mg/dl COMPARISON:  Chest CT 10/07/2017 FINDINGS: Mediastinal blood pool activity: SUV max 2.99 Liver activity: SUV max 4.1 NECK: chain of hypermetabolic nodules in the RIGHT neck extending from level 2 nodal  station to level 5 nodal station. Example lymph node anterior to the RIGHT sternocleidomastoid muscle measures 18 mm short axis with SUV max equal 8.2. A cluster of level 4 lymph nodes along the margin of the sternocleidomastoid muscle with SUV max equal 7.8. Nodes extend to the RIGHT supraclavicular nodal station with lymph node with SUV max equal 8.5. Incidental CT findings: none CHEST: There is mildly enlarged hypermetabolic mediastinal and subcarinal lymph nodes. For example subcarinal lymph node with SUV max equal 7.9 measures 2 cm short axis. Most of these hypermetabolic mediastinal nodes are approximately 1 cm. No hypermetabolic axillary lymph nodes. Incidental CT findings: No suspicious pulmonary nodules ABDOMEN/PELVIS: Minimal enlarged hypermetabolic lymph nodes in the abdomen pelvis. Lymph node position between the aorta and IVC in the upper abdomen measures 15 mm short axis with SUV max equal 5.3. Mild activity associated small retroperitoneal lymph nodes (SUV max equal 3.5). No hypermetabolic inguinal lymph nodes or iliac lymph nodes. Spleen is normal volume normal and metabolic activity. No abnormal activity in liver. A single focus of intense metabolic activity associated with the mid sigmoid colon with SUV max equal 13.7. There is a subtle potential a focus within the bowel wall measuring 10 mm (image 178/4). This is a term Incidental CT findings: none SKELETON: No focal hypermetabolic activity to suggest skeletal metastasis. Incidental CT findings: none IMPRESSION: 1. Multiple relatively small hypermetabolic lymph nodes in the RIGHT neck, mediastinum and limited involvement of the upper abdomen. Lymph node activity is high (greater than liver, Deauville 4). 2. The most intense enlarged lymph node is anterior to the sternocleidomastoid muscle on the RIGHT. There are multiple assessable lymph nodes in the RIGHT cervical chain. 3. Minimal hypermetabolic adenopathy in the upper abdomen. No pelvic or  inguinal metastatic adenopathy. 4. Normal spleen and bone marrow. 5. Single focus of metabolic activity in the sigmoid colon. Recommend screening colonoscopy if patient is not current for such. Electronically Signed  By: Suzy Bouchard M.D.   On: 11/21/2017 14:04   [facility] Guided Port Insertion  Result Date: 11/20/2017 INDICATION: 67 year old with Hodgkin's lymphoma. Port-A-Cath needed for treatment. EXAM: FLUOROSCOPIC AND ULTRASOUND GUIDED PLACEMENT OF A SUBCUTANEOUS PORT. Physician: Stephan Minister. Anselm Pancoast, MD MEDICATIONS: As antibiotic prophylaxis, Ancef 2 gm was ordered pre-procedure and administered intravenously within one hour of incision. ANESTHESIA/SEDATION: Versed 4.0 mg IV; Fentanyl 100 mcg IV; Moderate Sedation Time:  55 minutes The patient was continuously monitored during the procedure by the interventional radiology nurse under my direct supervision. FLUOROSCOPY TIME:  4 minutes and 30 seconds, 66 mGy COMPLICATIONS: None immediate. PROCEDURE: The procedure was explained to the patient. The risks and benefits of the procedure were discussed and the patient's questions were addressed. Informed consent was obtained from the patient. Patient was placed supine on the interventional table. Left side of the neck was selected for port placement due to large number of enlarged lymph nodes around the right internal jugular vein. Ultrasound confirmed a patent left internal jugular vein. Ultrasound image was saved for documentation. The left chest and neck were cleaned with a skin antiseptic and a sterile drape was placed. Maximal barrier sterile technique was utilized including caps, mask, sterile gowns, sterile gloves, sterile drape, hand hygiene and skin antiseptic. The left neck was anesthetized with 1% lidocaine. Small incision was made in the left neck with a blade. Micropuncture set was placed in the left IJ with ultrasound guidance. The wire was repeatedly going up the neck. Eventually, the wire was  advanced towards the central veins after the needle was directed more laterally. The left chest was anesthetized with 1% lidocaine with epinephrine. #15 blade was used to make an incision and a subcutaneous port pocket was formed. [place] was selected. Subcutaneous tunnel was formed with a stiff tunneling device. The port catheter was brought through the subcutaneous tunnel. J wire was advanced down the micropuncture catheter and a peel-away sheath was placed. Catheter was advanced through the peel-away sheath and positioned at the superior cavoatrial junction. Catheter was cut to an appropriate length and attached to the port. However, during placement of the port into the subcutaneous tunnel, the catheter got pulled back into the left innominate vein and could not be repositioned back into the lower SVC. As a result, the catheter was pulled out through the neck access site and removed over a Bentson wire. A new port device was required. A peel-away sheath was advanced through the existing left jugular access. New port catheter was tunneled through the subcutaneous tissues. Catheter was advanced through the peel-away sheath and positioned with the tip at the SVC and right atrium junction. Catheter was cut and attached to a new port. The port was successfully placed within the subcutaneous pocket. Port was accessed and found to aspirate and flush well. Port was flushed with heparinized saline. Catheter placement was confirmed with fluoroscopy. The port pocket was closed using two layers of absorbable sutures and Dermabond. The vein skin site was closed using a single layer of absorbable suture and Dermabond. Sterile dressings were applied. Patient tolerated the procedure well without an immediate complication. Ultrasound and fluoroscopic images were taken and saved for this procedure. IMPRESSION: Placement of a subcutaneous CT injectable port device. Catheter tip at the SVC and right atrium junction.  Electronically Signed   By: Markus Daft M.D.   On: 11/20/2017 17:35

## 2017-11-22 ENCOUNTER — Encounter: Payer: Self-pay | Admitting: *Deleted

## 2017-11-22 ENCOUNTER — Other Ambulatory Visit (HOSPITAL_COMMUNITY): Payer: Medicare Other

## 2017-11-25 ENCOUNTER — Encounter: Payer: Self-pay | Admitting: Hematology

## 2017-11-25 ENCOUNTER — Inpatient Hospital Stay: Payer: Medicare Other

## 2017-11-25 ENCOUNTER — Inpatient Hospital Stay: Payer: Medicare Other | Attending: Hematology | Admitting: Hematology

## 2017-11-25 ENCOUNTER — Telehealth: Payer: Self-pay | Admitting: Hematology

## 2017-11-25 VITALS — BP 138/84 | HR 76 | Temp 98.2°F | Resp 17

## 2017-11-25 DIAGNOSIS — R590 Localized enlarged lymph nodes: Secondary | ICD-10-CM | POA: Diagnosis not present

## 2017-11-25 DIAGNOSIS — Z7982 Long term (current) use of aspirin: Secondary | ICD-10-CM | POA: Diagnosis not present

## 2017-11-25 DIAGNOSIS — R490 Dysphonia: Secondary | ICD-10-CM | POA: Insufficient documentation

## 2017-11-25 DIAGNOSIS — Z79899 Other long term (current) drug therapy: Secondary | ICD-10-CM | POA: Insufficient documentation

## 2017-11-25 DIAGNOSIS — R109 Unspecified abdominal pain: Secondary | ICD-10-CM | POA: Insufficient documentation

## 2017-11-25 DIAGNOSIS — Z5111 Encounter for antineoplastic chemotherapy: Secondary | ICD-10-CM | POA: Diagnosis not present

## 2017-11-25 DIAGNOSIS — R300 Dysuria: Secondary | ICD-10-CM | POA: Insufficient documentation

## 2017-11-25 DIAGNOSIS — C8190 Hodgkin lymphoma, unspecified, unspecified site: Secondary | ICD-10-CM

## 2017-11-25 DIAGNOSIS — T451X5A Adverse effect of antineoplastic and immunosuppressive drugs, initial encounter: Secondary | ICD-10-CM | POA: Diagnosis not present

## 2017-11-25 DIAGNOSIS — D701 Agranulocytosis secondary to cancer chemotherapy: Secondary | ICD-10-CM | POA: Diagnosis not present

## 2017-11-25 DIAGNOSIS — Z95828 Presence of other vascular implants and grafts: Secondary | ICD-10-CM

## 2017-11-25 LAB — CBC WITH DIFFERENTIAL (CANCER CENTER ONLY)
ABS IMMATURE GRANULOCYTES: 0.03 10*3/uL (ref 0.00–0.07)
BASOS PCT: 0 %
Basophils Absolute: 0 10*3/uL (ref 0.0–0.1)
Eosinophils Absolute: 0.1 10*3/uL (ref 0.0–0.5)
Eosinophils Relative: 1 %
HCT: 40.5 % (ref 39.0–52.0)
Hemoglobin: 13.2 g/dL (ref 13.0–17.0)
IMMATURE GRANULOCYTES: 0 %
Lymphocytes Relative: 16 %
Lymphs Abs: 1.4 10*3/uL (ref 0.7–4.0)
MCH: 28.9 pg (ref 26.0–34.0)
MCHC: 32.6 g/dL (ref 30.0–36.0)
MCV: 88.6 fL (ref 80.0–100.0)
MONOS PCT: 8 %
Monocytes Absolute: 0.7 10*3/uL (ref 0.1–1.0)
NEUTROS ABS: 6.6 10*3/uL (ref 1.7–7.7)
Neutrophils Relative %: 75 %
PLATELETS: 224 10*3/uL (ref 150–400)
RBC: 4.57 MIL/uL (ref 4.22–5.81)
RDW: 12.3 % (ref 11.5–15.5)
WBC Count: 8.8 10*3/uL (ref 4.0–10.5)
nRBC: 0 % (ref 0.0–0.2)

## 2017-11-25 LAB — CMP (CANCER CENTER ONLY)
ALT: 43 U/L (ref 10–47)
AST: 42 U/L — ABNORMAL HIGH (ref 11–38)
Albumin: 3.5 g/dL (ref 3.5–5.0)
Alkaline Phosphatase: 143 U/L — ABNORMAL HIGH (ref 26–84)
Anion gap: 5 (ref 5–15)
BUN: 16 mg/dL (ref 7–22)
CHLORIDE: 106 mmol/L (ref 98–108)
CO2: 27 mmol/L (ref 18–33)
Calcium: 8.9 mg/dL (ref 8.0–10.3)
Creatinine: 0.7 mg/dL (ref 0.60–1.20)
GLUCOSE: 100 mg/dL (ref 73–118)
Potassium: 3.9 mmol/L (ref 3.3–4.7)
SODIUM: 138 mmol/L (ref 128–145)
Total Bilirubin: 0.9 mg/dL (ref 0.2–1.6)
Total Protein: 7.3 g/dL (ref 6.4–8.1)

## 2017-11-25 MED ORDER — DEXAMETHASONE 4 MG PO TABS: ORAL_TABLET | ORAL | 1 refills | Status: DC

## 2017-11-25 MED ORDER — ONDANSETRON HCL 8 MG PO TABS: 8.0000 mg | ORAL_TABLET | Freq: Two times a day (BID) | ORAL | 1 refills | Status: DC | PRN

## 2017-11-25 MED ORDER — LIDOCAINE-PRILOCAINE 2.5-2.5 % EX CREA: TOPICAL_CREAM | CUTANEOUS | 3 refills | Status: DC

## 2017-11-25 MED ORDER — SODIUM CHLORIDE 0.9% FLUSH
10.0000 mL | INTRAVENOUS | Status: DC | PRN
  Administered 2017-11-25: 10 mL via INTRAVENOUS
  Filled 2017-11-25: qty 10

## 2017-11-25 MED ORDER — PROCHLORPERAZINE MALEATE 10 MG PO TABS: 10.0000 mg | ORAL_TABLET | Freq: Four times a day (QID) | ORAL | 1 refills | Status: DC | PRN

## 2017-11-25 MED ORDER — LORAZEPAM 0.5 MG PO TABS: 0.5000 mg | ORAL_TABLET | Freq: Four times a day (QID) | ORAL | 0 refills | Status: DC | PRN

## 2017-11-25 MED ORDER — HEPARIN SOD (PORK) LOCK FLUSH 100 UNIT/ML IV SOLN
500.0000 [IU] | Freq: Once | INTRAVENOUS | Status: AC
  Administered 2017-11-25: 500 [IU] via INTRAVENOUS
  Filled 2017-11-25: qty 5

## 2017-11-25 NOTE — Patient Instructions (Signed)
Implanted Port Home Guide An implanted port is a type of central line that is placed under the skin. Central lines are used to provide IV access when treatment or nutrition needs to be given through a person's veins. Implanted ports are used for long-term IV access. An implanted port may be placed because:  You need IV medicine that would be irritating to the small veins in your hands or arms.  You need long-term IV medicines, such as antibiotics.  You need IV nutrition for a long period.  You need frequent blood draws for lab tests.  You need dialysis.  Implanted ports are usually placed in the chest area, but they can also be placed in the upper arm, the abdomen, or the leg. An implanted port has two main parts:  Reservoir. The reservoir is round and will appear as a small, raised area under your skin. The reservoir is the part where a needle is inserted to give medicines or draw blood.  Catheter. The catheter is a thin, flexible tube that extends from the reservoir. The catheter is placed into a large vein. Medicine that is inserted into the reservoir goes into the catheter and then into the vein.  How will I care for my incision site? Do not get the incision site wet. Bathe or shower as directed by your health care provider. How is my port accessed? Special steps must be taken to access the port:  Before the port is accessed, a numbing cream can be placed on the skin. This helps numb the skin over the port site.  Your health care provider uses a sterile technique to access the port. ? Your health care provider must put on a mask and sterile gloves. ? The skin over your port is cleaned carefully with an antiseptic and allowed to dry. ? The port is gently pinched between sterile gloves, and a needle is inserted into the port.  Only "non-coring" port needles should be used to access the port. Once the port is accessed, a blood return should be checked. This helps ensure that the port  is in the vein and is not clogged.  If your port needs to remain accessed for a constant infusion, a clear (transparent) bandage will be placed over the needle site. The bandage and needle will need to be changed every week, or as directed by your health care provider.  Keep the bandage covering the needle clean and dry. Do not get it wet. Follow your health care provider's instructions on how to take a shower or bath while the port is accessed.  If your port does not need to stay accessed, no bandage is needed over the port.  What is flushing? Flushing helps keep the port from getting clogged. Follow your health care provider's instructions on how and when to flush the port. Ports are usually flushed with saline solution or a medicine called heparin. The need for flushing will depend on how the port is used.  If the port is used for intermittent medicines or blood draws, the port will need to be flushed: ? After medicines have been given. ? After blood has been drawn. ? As part of routine maintenance.  If a constant infusion is running, the port may not need to be flushed.  How long will my port stay implanted? The port can stay in for as long as your health care provider thinks it is needed. When it is time for the port to come out, surgery will be   done to remove it. The procedure is similar to the one performed when the port was put in. When should I seek immediate medical care? When you have an implanted port, you should seek immediate medical care if:  You notice a bad smell coming from the incision site.  You have swelling, redness, or drainage at the incision site.  You have more swelling or pain at the port site or the surrounding area.  You have a fever that is not controlled with medicine.  This information is not intended to replace advice given to you by your health care provider. Make sure you discuss any questions you have with your health care provider. Document  Released: 01/01/2005 Document Revised: 06/09/2015 Document Reviewed: 09/08/2012 Elsevier Interactive Patient Education  2017 [employer]  

## 2017-11-26 ENCOUNTER — Inpatient Hospital Stay: Payer: Medicare Other

## 2017-11-26 ENCOUNTER — Telehealth: Payer: Self-pay | Admitting: Hematology & Oncology

## 2017-11-26 ENCOUNTER — Encounter: Payer: Self-pay | Admitting: *Deleted

## 2017-11-26 VITALS — BP 147/75 | HR 72 | Temp 98.8°F | Resp 18

## 2017-11-26 DIAGNOSIS — Z5111 Encounter for antineoplastic chemotherapy: Secondary | ICD-10-CM | POA: Diagnosis not present

## 2017-11-26 DIAGNOSIS — R300 Dysuria: Secondary | ICD-10-CM | POA: Diagnosis not present

## 2017-11-26 DIAGNOSIS — T451X5A Adverse effect of antineoplastic and immunosuppressive drugs, initial encounter: Secondary | ICD-10-CM | POA: Diagnosis not present

## 2017-11-26 DIAGNOSIS — C8190 Hodgkin lymphoma, unspecified, unspecified site: Secondary | ICD-10-CM | POA: Diagnosis not present

## 2017-11-26 DIAGNOSIS — D701 Agranulocytosis secondary to cancer chemotherapy: Secondary | ICD-10-CM | POA: Diagnosis not present

## 2017-11-26 DIAGNOSIS — R109 Unspecified abdominal pain: Secondary | ICD-10-CM | POA: Diagnosis not present

## 2017-11-26 MED ORDER — SODIUM CHLORIDE 0.9 % IV SOLN
10.0000 [IU]/m2 | Freq: Once | INTRAVENOUS | Status: AC
  Administered 2017-11-26: 24 [IU] via INTRAVENOUS
  Filled 2017-11-26: qty 8

## 2017-11-26 MED ORDER — PALONOSETRON HCL INJECTION 0.25 MG/5ML
0.2500 mg | Freq: Once | INTRAVENOUS | Status: AC
  Administered 2017-11-26: 0.25 mg via INTRAVENOUS

## 2017-11-26 MED ORDER — SODIUM CHLORIDE 0.9 % IV SOLN
Freq: Once | INTRAVENOUS | Status: AC
  Administered 2017-11-26: 10:00:00 via INTRAVENOUS
  Filled 2017-11-26: qty 250

## 2017-11-26 MED ORDER — DOXORUBICIN HCL CHEMO IV INJECTION 2 MG/ML
25.0000 mg/m2 | Freq: Once | INTRAVENOUS | Status: AC
  Administered 2017-11-26: 60 mg via INTRAVENOUS
  Filled 2017-11-26: qty 30

## 2017-11-26 MED ORDER — HEPARIN SOD (PORK) LOCK FLUSH 100 UNIT/ML IV SOLN
500.0000 [IU] | Freq: Once | INTRAVENOUS | Status: AC | PRN
  Administered 2017-11-26: 500 [IU]
  Filled 2017-11-26: qty 5

## 2017-11-26 MED ORDER — PALONOSETRON HCL INJECTION 0.25 MG/5ML
INTRAVENOUS | Status: AC
  Filled 2017-11-26: qty 5

## 2017-11-26 MED ORDER — SODIUM CHLORIDE 0.9 % IV SOLN
Freq: Once | INTRAVENOUS | Status: AC
  Administered 2017-11-26: 10:00:00 via INTRAVENOUS
  Filled 2017-11-26: qty 5

## 2017-11-26 MED ORDER — VINBLASTINE SULFATE CHEMO INJECTION 1 MG/ML
15.0000 mg | Freq: Once | INTRAVENOUS | Status: AC
  Administered 2017-11-26: 15 mg via INTRAVENOUS
  Filled 2017-11-26: qty 15

## 2017-11-26 MED ORDER — SODIUM CHLORIDE 0.9 % IV SOLN
370.0000 mg/m2 | Freq: Once | INTRAVENOUS | Status: AC
  Administered 2017-11-26: 900 mg via INTRAVENOUS
  Filled 2017-11-26: qty 90

## 2017-11-26 MED ORDER — SODIUM CHLORIDE 0.9% FLUSH
10.0000 mL | INTRAVENOUS | Status: DC | PRN
  Administered 2017-11-26: 10 mL
  Filled 2017-11-26: qty 10

## 2017-11-26 NOTE — Telephone Encounter (Signed)
Appointments scheduled Calendar printed per 11/11 los

## 2017-11-26 NOTE — Patient Instructions (Addendum)
[facility] Discharge Instructions for Patients Receiving Chemotherapy  Today you received the following chemotherapy agents Dacarbazine (DTIC), Adriamycin, Bleomycin and Velban  To help prevent nausea and vomiting after your treatment, we encourage you to take your nausea medication  Begin taking Wednesday 11/27/2017 1.) Dexamethasone (Decadron) Take 2 tablets by  Mouth once a day on the day after chemo (Wednesday) and then take 2 tablets two times a day for 2 days.  Take with food. Can take anytime 1) Compazine - Take 1 tablet by mouth every 6 hours as needed for nausea/vomiting 2) Ativan - Take 1 tablet by mouth every 6 hours as needed for nausea or vomiting. Can begin taking Friday  1) Zofran (Ondansetron).  Take 1 tablet by mouth 2 times daily as needed.  Begin on 3rd day of chemo  If you develop nausea and vomiting that is not controlled by your nausea medication, call the clinic.   BELOW ARE SYMPTOMS THAT SHOULD BE REPORTED IMMEDIATELY:  *FEVER GREATER THAN 100.5 F  *CHILLS WITH OR WITHOUT FEVER  NAUSEA AND VOMITING THAT IS NOT CONTROLLED WITH YOUR NAUSEA MEDICATION  *UNUSUAL SHORTNESS OF BREATH  *UNUSUAL BRUISING OR BLEEDING  TENDERNESS IN MOUTH AND THROAT WITH OR WITHOUT PRESENCE OF ULCERS  *URINARY PROBLEMS  *BOWEL PROBLEMS  UNUSUAL RASH Items with * indicate a potential emergency and should be followed up as soon as possible.  Feel free to call the clinic should you have any questions or concerns. The clinic phone number is [phone].  Please show the [facility] at check-in to the Emergency Department and triage nurse.   Dacarbazine, DTIC injection What is this medicine? DACARBAZINE (da KAR ba zeen) is a chemotherapy drug. This medicine is used to treat skin cancer. It is also used with other medicines to treat Hodgkin's disease. This medicine may be used for other purposes; ask your health care provider or pharmacist if you have  questions. COMMON BRAND NAME(S): DTIC-Dome What should I tell my health care provider before I take this medicine? They need to know if you have any of these conditions: -infection (especially virus infection such as chickenpox, cold sores, or herpes) -kidney disease -liver disease -low blood counts like low platelets, red blood cells, white blood cells -recent radiation therapy -an unusual or allergic reaction to dacarbazine, other chemotherapy agents, other medicines, foods, dyes, or preservatives -pregnant or trying to get pregnant -breast-feeding How should I use this medicine? This drug is given as an injection or infusion into a vein. It is administered in a hospital or clinic by a specially trained health care professional. Talk to your pediatrician regarding the use of this medicine in children. While this drug may be prescribed for selected conditions, precautions do apply. Overdosage: If you think you have taken too much of this medicine contact a poison control center or emergency room at once. NOTE: This medicine is only for you. Do not share this medicine with others. What if I miss a dose? It is important not to miss your dose. Call your doctor or health care professional if you are unable to keep an appointment. What may interact with this medicine? -medicines to increase blood counts like filgrastim, pegfilgrastim, sargramostim -vaccines This list may not describe all possible interactions. Give your health care provider a list of all the medicines, herbs, non-prescription drugs, or dietary supplements you use. Also tell them if you smoke, drink alcohol, or use illegal drugs. Some items may interact with your medicine.  What should I watch for while using this medicine? Your condition will be monitored carefully while you are receiving this medicine. You will need important blood work done while you are taking this medicine. This drug may make you feel generally unwell. This  is not uncommon, as chemotherapy can affect healthy cells as well as cancer cells. Report any side effects. Continue your course of treatment even though you feel ill unless your doctor tells you to stop. Call your doctor or health care professional for advice if you get a fever, chills or sore throat, or other symptoms of a cold or flu. Do not treat yourself. This drug decreases your body's ability to fight infections. Try to avoid being around people who are sick. This medicine may increase your risk to bruise or bleed. Call your doctor or health care professional if you notice any unusual bleeding. Talk to your doctor about your risk of cancer. You may be more at risk for certain types of cancers if you take this medicine. Do not become pregnant while taking this medicine. Women should inform their doctor if they wish to become pregnant or think they might be pregnant. There is a potential for serious side effects to an unborn child. Talk to your health care professional or pharmacist for more information. Do not breast-feed an infant while taking this medicine. What side effects may I notice from receiving this medicine? Side effects that you should report to your doctor or health care professional as soon as possible: -allergic reactions like skin rash, itching or hives, swelling of the face, lips, or tongue -low blood counts - this medicine may decrease the number of white blood cells, red blood cells and platelets. You may be at increased risk for infections and bleeding. -signs of infection - fever or chills, cough, sore throat, pain or difficulty passing urine -signs of decreased platelets or bleeding - bruising, pinpoint red spots on the skin, black, tarry stools, blood in the urine -signs of decreased red blood cells - unusually weak or tired, fainting spells, lightheadedness -breathing problems -muscle pains -pain at site where injected -trouble passing urine or change in the amount of  urine -vomiting -yellowing of the eyes or skin Side effects that usually do not require medical attention (report to your doctor or health care professional if they continue or are bothersome): -diarrhea -hair loss -loss of appetite -nausea -skin more sensitive to sun or ultraviolet light -stomach upset This list may not describe all possible side effects. Call your doctor for medical advice about side effects. You may report side effects to FDA at 1-800-FDA-1088. Where should I keep my medicine? This drug is given in a hospital or clinic and will not be stored at home. NOTE: This sheet is a summary. It may not cover all possible information. If you have questions about this medicine, talk to your doctor, pharmacist, or health care provider.  2018 Elsevier/Gold Standard (2015-03-04 15:17:39) Vincristine injection What is this medicine? VINCRISTINE (vin KRIS teen) is a chemotherapy drug. It slows the growth of cancer cells. This medicine is used to treat many types of cancer like Hodgkin's disease, leukemia, non-Hodgkin's lymphoma, neuroblastoma (brain cancer), rhabdomyosarcoma, and Wilms' tumor. This medicine may be used for other purposes; ask your health care provider or pharmacist if you have questions. COMMON BRAND NAME(S): Oncovin, Vincasar PFS What should I tell my health care provider before I take this medicine? They need to know if you have any of these conditions: -blood disorders -gout -  infection (especially chickenpox, cold sores, or herpes) -kidney disease -liver disease -lung disease -nervous system disease like Charcot-Marie-Tooth (CMT) -recent or ongoing radiation therapy -an unusual or allergic reaction to vincristine, other chemotherapy agents, other medicines, foods, dyes, or preservatives -pregnant or trying to get pregnant -breast-feeding How should I use this medicine? This drug is given as an infusion into a vein. It is administered in a hospital or clinic by  a specially trained health care professional. If you have pain, swelling, burning, or any unusual feeling around the site of your injection, tell your health care professional right away. Talk to your pediatrician regarding the use of this medicine in children. While this drug may be prescribed for selected conditions, precautions do apply. Overdosage: If you think you have taken too much of this medicine contact a poison control center or emergency room at once. NOTE: This medicine is only for you. Do not share this medicine with others. What if I miss a dose? It is important not to miss your dose. Call your doctor or health care professional if you are unable to keep an appointment. What may interact with this medicine? Do not take this medicine with any of the following medications: -itraconazole -mibefradil -voriconazole This medicine may also interact with the following medications: -cyclosporine -erythromycin -fluconazole -ketoconazole -medicines for HIV like delavirdine, efavirenz, nevirapine -medicines for seizures like ethotoin, fosphenotoin, phenytoin -medicines to increase blood counts like filgrastim, pegfilgrastim, sargramostim -other chemotherapy drugs like cisplatin, L-asparaginase, methotrexate, mitomycin, paclitaxel -pegaspargase -vaccines -zalcitabine, ddC Talk to your doctor or health care professional before taking any of these medicines: -acetaminophen -aspirin -ibuprofen -ketoprofen -naproxen This list may not describe all possible interactions. Give your health care provider a list of all the medicines, herbs, non-prescription drugs, or dietary supplements you use. Also tell them if you smoke, drink alcohol, or use illegal drugs. Some items may interact with your medicine. What should I watch for while using this medicine? Your condition will be monitored carefully while you are receiving this medicine. You will need important blood work done while you are taking  this medicine. This drug may make you feel generally unwell. This is not uncommon, as chemotherapy can affect healthy cells as well as cancer cells. Report any side effects. Continue your course of treatment even though you feel ill unless your doctor tells you to stop. In some cases, you may be given additional medicines to help with side effects. Follow all directions for their use. Call your doctor or health care professional for advice if you get a fever, chills or sore throat, or other symptoms of a cold or flu. Do not treat yourself. Avoid taking products that contain aspirin, acetaminophen, ibuprofen, naproxen, or ketoprofen unless instructed by your doctor. These medicines may hide a fever. Do not become pregnant while taking this medicine. Women should inform their doctor if they wish to become pregnant or think they might be pregnant. There is a potential for serious side effects to an unborn child. Talk to your health care professional or pharmacist for more information. Do not breast-feed an infant while taking this medicine. Men may have a lower sperm count while taking this medicine. Talk to your doctor if you plan to father a child. What side effects may I notice from receiving this medicine? Side effects that you should report to your doctor or health care professional as soon as possible: -allergic reactions like skin rash, itching or hives, swelling of the face, lips, or tongue -breathing problems -confusion  or changes in emotions or moods -constipation -cough -mouth sores -muscle weakness -nausea and vomiting -pain, swelling, redness or irritation at the injection site -pain, tingling, numbness in the hands or feet -problems with balance, talking, walking -seizures -stomach pain -trouble passing urine or change in the amount of urine Side effects that usually do not require medical attention (report to your doctor or health care professional if they continue or are  bothersome): -diarrhea -hair loss -jaw pain -loss of appetite This list may not describe all possible side effects. Call your doctor for medical advice about side effects. You may report side effects to FDA at 1-800-FDA-1088. Where should I keep my medicine? This drug is given in a hospital or clinic and will not be stored at home. NOTE: This sheet is a summary. It may not cover all possible information. If you have questions about this medicine, talk to your doctor, pharmacist, or health care provider.  2018 Elsevier/Gold Standard (2007-09-29 17:17:13) Bleomycin injection What is this medicine? BLEOMYCIN (blee oh MYE sin) is a chemotherapy drug. It is used to treat many kinds of cancer like lymphoma, cervical cancer, head and neck cancer, and testicular cancer. It is also used to prevent and to treat fluid build-up around the lungs caused by some cancers. This medicine may be used for other purposes; ask your health care provider or pharmacist if you have questions. COMMON BRAND NAME(S): Blenoxane What should I tell my health care provider before I take this medicine? They need to know if you have any of these conditions: -cigarette smoker -kidney disease -lung disease -recent or ongoing radiation therapy -an unusual or allergic reaction to bleomycin, other chemotherapy agents, other medicines, foods, dyes, or preservatives -pregnant or trying to get pregnant -breast-feeding How should I use this medicine? This drug is given as an infusion into a vein or a body cavity. It can also be given as an injection into a muscle or under the skin. It is administered in a hospital or clinic by a specially trained health care professional. Talk to your pediatrician regarding the use of this medicine in children. Special care may be needed. Overdosage: If you think you have taken too much of this medicine contact a poison control center or emergency room at once. NOTE: This medicine is only for you.  Do not share this medicine with others. What if I miss a dose? It is important not to miss your dose. Call your doctor or health care professional if you are unable to keep an appointment. What may interact with this medicine? -certain antibiotics given by injection -cisplatin -cyclosporine -diuretics -foscarnet -medicines to increase blood counts like filgrastim, pegfilgrastim, sargramostim -vaccines This list may not describe all possible interactions. Give your health care provider a list of all the medicines, herbs, non-prescription drugs, or dietary supplements you use. Also tell them if you smoke, drink alcohol, or use illegal drugs. Some items may interact with your medicine. What should I watch for while using this medicine? Visit your doctor for checks on your progress. This drug may make you feel generally unwell. This is not uncommon, as chemotherapy can affect healthy cells as well as cancer cells. Report any side effects. Continue your course of treatment even though you feel ill unless your doctor tells you to stop. Call your doctor or health care professional for advice if you get a fever, chills or sore throat, or other symptoms of a cold or flu. Do not treat yourself. This drug decreases your body's  ability to fight infections. Try to avoid being around people who are sick. Avoid taking products that contain aspirin, acetaminophen, ibuprofen, naproxen, or ketoprofen unless instructed by your doctor. These medicines may hide a fever. Do not become pregnant while taking this medicine. Women should inform their doctor if they wish to become pregnant or think they might be pregnant. There is a potential for serious side effects to an unborn child. Talk to your health care professional or pharmacist for more information. Do not breast-feed an infant while taking this medicine. There is a maximum amount of this medicine you should receive throughout your life. The amount depends on the  medical condition being treated and your overall health. Your doctor will watch how much of this medicine you receive in your lifetime. Tell your doctor if you have taken this medicine before. What side effects may I notice from receiving this medicine? Side effects that you should report to your doctor or health care professional as soon as possible: -allergic reactions like skin rash, itching or hives, swelling of the face, lips, or tongue -breathing problems -chest pain -confusion -cough -fast, irregular heartbeat -feeling faint or lightheaded, falls -fever or chills -mouth sores -pain, tingling, numbness in the hands or feet -trouble passing urine or change in the amount of urine -yellowing of the eyes or skin Side effects that usually do not require medical attention (report to your doctor or health care professional if they continue or are bothersome): -darker skin color -hair loss -irritation at site where injected -loss of appetite -nail changes -nausea and vomiting -weight loss This list may not describe all possible side effects. Call your doctor for medical advice about side effects. You may report side effects to FDA at 1-800-FDA-1088. Where should I keep my medicine? This drug is given in a hospital or clinic and will not be stored at home. NOTE: This sheet is a summary. It may not cover all possible information. If you have questions about this medicine, talk to your doctor, pharmacist, or health care provider.  2018 Elsevier/Gold Standard (2012-04-29 09:36:48) Doxorubicin injection What is this medicine? DOXORUBICIN (dox oh ROO bi sin) is a chemotherapy drug. It is used to treat many kinds of cancer like leukemia, lymphoma, neuroblastoma, sarcoma, and Wilms' tumor. It is also used to treat bladder cancer, breast cancer, lung cancer, ovarian cancer, stomach cancer, and thyroid cancer. This medicine may be used for other purposes; ask your health care provider or  pharmacist if you have questions. COMMON BRAND NAME(S): Adriamycin, Adriamycin PFS, Adriamycin RDF, Rubex What should I tell my health care provider before I take this medicine? They need to know if you have any of these conditions: -heart disease -history of low blood counts caused by a medicine -liver disease -recent or ongoing radiation therapy -an unusual or allergic reaction to doxorubicin, other chemotherapy agents, other medicines, foods, dyes, or preservatives -pregnant or trying to get pregnant -breast-feeding How should I use this medicine? This drug is given as an infusion into a vein. It is administered in a hospital or clinic by a specially trained health care professional. If you have pain, swelling, burning or any unusual feeling around the site of your injection, tell your health care professional right away. Talk to your pediatrician regarding the use of this medicine in children. Special care may be needed. Overdosage: If you think you have taken too much of this medicine contact a poison control center or emergency room at once. NOTE: This medicine is only  for you. Do not share this medicine with others. What if I miss a dose? It is important not to miss your dose. Call your doctor or health care professional if you are unable to keep an appointment. What may interact with this medicine? This medicine may interact with the following medications: -6-mercaptopurine -paclitaxel -phenytoin -St. John's Wort -trastuzumab -verapamil This list may not describe all possible interactions. Give your health care provider a list of all the medicines, herbs, non-prescription drugs, or dietary supplements you use. Also tell them if you smoke, drink alcohol, or use illegal drugs. Some items may interact with your medicine. What should I watch for while using this medicine? This drug may make you feel generally unwell. This is not uncommon, as chemotherapy can affect healthy cells as  well as cancer cells. Report any side effects. Continue your course of treatment even though you feel ill unless your doctor tells you to stop. There is a maximum amount of this medicine you should receive throughout your life. The amount depends on the medical condition being treated and your overall health. Your doctor will watch how much of this medicine you receive in your lifetime. Tell your doctor if you have taken this medicine before. You may need blood work done while you are taking this medicine. Your urine may turn red for a few days after your dose. This is not blood. If your urine is dark or brown, call your doctor. In some cases, you may be given additional medicines to help with side effects. Follow all directions for their use. Call your doctor or health care professional for advice if you get a fever, chills or sore throat, or other symptoms of a cold or flu. Do not treat yourself. This drug decreases your body's ability to fight infections. Try to avoid being around people who are sick. This medicine may increase your risk to bruise or bleed. Call your doctor or health care professional if you notice any unusual bleeding. Talk to your doctor about your risk of cancer. You may be more at risk for certain types of cancers if you take this medicine. Do not become pregnant while taking this medicine or for 6 months after stopping it. Women should inform their doctor if they wish to become pregnant or think they might be pregnant. Men should not father a child while taking this medicine and for 6 months after stopping it. There is a potential for serious side effects to an unborn child. Talk to your health care professional or pharmacist for more information. Do not breast-feed an infant while taking this medicine. This medicine has caused ovarian failure in some women and reduced sperm counts in some men This medicine may interfere with the ability to have a child. Talk with your doctor or  health care professional if you are concerned about your fertility. What side effects may I notice from receiving this medicine? Side effects that you should report to your doctor or health care professional as soon as possible: -allergic reactions like skin rash, itching or hives, swelling of the face, lips, or tongue -breathing problems -chest pain -fast or irregular heartbeat -low blood counts - this medicine may decrease the number of white blood cells, red blood cells and platelets. You may be at increased risk for infections and bleeding. -pain, redness, or irritation at site where injected -signs of infection - fever or chills, cough, sore throat, pain or difficulty passing urine -signs of decreased platelets or bleeding - bruising, pinpoint  red spots on the skin, black, tarry stools, blood in the urine -swelling of the ankles, feet, hands -tiredness -weakness Side effects that usually do not require medical attention (report to your doctor or health care professional if they continue or are bothersome): -diarrhea -hair loss -mouth sores -nail discoloration or damage -nausea -red colored urine -vomiting This list may not describe all possible side effects. Call your doctor for medical advice about side effects. You may report side effects to FDA at 1-800-FDA-1088. Where should I keep my medicine? This drug is given in a hospital or clinic and will not be stored at home. NOTE: This sheet is a summary. It may not cover all possible information. If you have questions about this medicine, talk to your doctor, pharmacist, or health care provider.  2018 Elsevier/Gold Standard (2015-02-28 11:28:51)

## 2017-11-27 ENCOUNTER — Telehealth: Payer: Self-pay | Admitting: *Deleted

## 2017-11-27 NOTE — Telephone Encounter (Signed)
Pt called requesting to talk to nurses at [facility].  Noted pt had first chemo at [facility] on  11/26/17.  Called pt back, and offered to help with any issues pt has.  Pt declined, and wished to talk only to either Sheria Lang, nurse educator, and Antonietta Barcelona, infusion nurse who gave pt first treatment yesterday.  Asked if pt had any problems that this nurse could assist, pt stated " I am fine today.  Just want to talk to either of the nurses  mentioned to let them know what I had experienced yesterday after chemo. Message routed to appropriate nurses and Dr. Maylon Peppers. Pt's    Phone     [phone].

## 2017-11-29 ENCOUNTER — Encounter: Payer: Self-pay | Admitting: Medical

## 2017-11-29 ENCOUNTER — Encounter: Payer: Self-pay | Admitting: Hematology & Oncology

## 2017-11-29 ENCOUNTER — Encounter: Payer: Self-pay | Admitting: Hematology

## 2017-12-02 ENCOUNTER — Telehealth: Payer: Self-pay | Admitting: *Deleted

## 2017-12-02 ENCOUNTER — Encounter: Payer: Self-pay | Admitting: Hematology

## 2017-12-02 NOTE — Telephone Encounter (Signed)
Returned patient's Bradley Novak question via telephone call. Patient denied fever and cough. He has a runny nose with clear drainage. Per Dr. Maylon Peppers, no antibiotics needed at this time. Instructed patient to watch drainage, if changes colors, please call the office. He verbalized understanding.

## 2017-12-03 ENCOUNTER — Encounter: Payer: Self-pay | Admitting: Hematology

## 2017-12-04 ENCOUNTER — Other Ambulatory Visit: Payer: Self-pay | Admitting: Hematology

## 2017-12-04 ENCOUNTER — Encounter: Payer: Self-pay | Admitting: Hematology

## 2017-12-05 ENCOUNTER — Ambulatory Visit: Payer: Medicare Other

## 2017-12-05 ENCOUNTER — Other Ambulatory Visit: Payer: Medicare Other

## 2017-12-09 ENCOUNTER — Inpatient Hospital Stay: Payer: Medicare Other

## 2017-12-09 ENCOUNTER — Other Ambulatory Visit: Payer: Self-pay

## 2017-12-09 VITALS — BP 152/82 | HR 64 | Resp 18

## 2017-12-09 DIAGNOSIS — Z5111 Encounter for antineoplastic chemotherapy: Secondary | ICD-10-CM | POA: Diagnosis not present

## 2017-12-09 DIAGNOSIS — D701 Agranulocytosis secondary to cancer chemotherapy: Secondary | ICD-10-CM | POA: Diagnosis not present

## 2017-12-09 DIAGNOSIS — R109 Unspecified abdominal pain: Secondary | ICD-10-CM | POA: Insufficient documentation

## 2017-12-09 DIAGNOSIS — C8190 Hodgkin lymphoma, unspecified, unspecified site: Secondary | ICD-10-CM

## 2017-12-09 DIAGNOSIS — T451X5A Adverse effect of antineoplastic and immunosuppressive drugs, initial encounter: Secondary | ICD-10-CM | POA: Diagnosis not present

## 2017-12-09 DIAGNOSIS — R300 Dysuria: Secondary | ICD-10-CM | POA: Diagnosis not present

## 2017-12-09 LAB — CBC WITH DIFFERENTIAL (CANCER CENTER ONLY)
Abs Immature Granulocytes: 0.01 10*3/uL (ref 0.00–0.07)
BASOS PCT: 2 %
Basophils Absolute: 0 10*3/uL (ref 0.0–0.1)
EOS PCT: 6 %
Eosinophils Absolute: 0.1 10*3/uL (ref 0.0–0.5)
HEMATOCRIT: 38 % — AB (ref 39.0–52.0)
Hemoglobin: 12.4 g/dL — ABNORMAL LOW (ref 13.0–17.0)
IMMATURE GRANULOCYTES: 1 %
Lymphocytes Relative: 62 %
Lymphs Abs: 1.3 10*3/uL (ref 0.7–4.0)
MCH: 28.9 pg (ref 26.0–34.0)
MCHC: 32.6 g/dL (ref 30.0–36.0)
MCV: 88.6 fL (ref 80.0–100.0)
Monocytes Absolute: 0.5 10*3/uL (ref 0.1–1.0)
Monocytes Relative: 22 %
NEUTROS PCT: 7 %
Neutro Abs: 0.2 10*3/uL — CL (ref 1.7–7.7)
PLATELETS: 291 10*3/uL (ref 150–400)
RBC: 4.29 MIL/uL (ref 4.22–5.81)
RDW: 11.9 % (ref 11.5–15.5)
WBC Count: 2.1 10*3/uL — ABNORMAL LOW (ref 4.0–10.5)
nRBC: 0 % (ref 0.0–0.2)

## 2017-12-09 LAB — CMP (CANCER CENTER ONLY)
ALBUMIN: 3.5 g/dL (ref 3.5–5.0)
ALT: 26 U/L (ref 10–47)
AST: 21 U/L (ref 11–38)
Alkaline Phosphatase: 72 U/L (ref 26–84)
Anion gap: 1 — ABNORMAL LOW (ref 5–15)
BILIRUBIN TOTAL: 0.6 mg/dL (ref 0.2–1.6)
BUN: 14 mg/dL (ref 7–22)
CALCIUM: 9.2 mg/dL (ref 8.0–10.3)
CO2: 28 mmol/L (ref 18–33)
CREATININE: 0.9 mg/dL (ref 0.60–1.20)
Chloride: 107 mmol/L (ref 98–108)
Glucose, Bld: 105 mg/dL (ref 73–118)
Potassium: 3.7 mmol/L (ref 3.3–4.7)
Sodium: 136 mmol/L (ref 128–145)
TOTAL PROTEIN: 6.9 g/dL (ref 6.4–8.1)

## 2017-12-09 MED ORDER — HEPARIN SOD (PORK) LOCK FLUSH 100 UNIT/ML IV SOLN
500.0000 [IU] | Freq: Once | INTRAVENOUS | Status: AC
  Administered 2017-12-09: 500 [IU] via INTRAVENOUS
  Filled 2017-12-09: qty 5

## 2017-12-09 MED ORDER — SODIUM CHLORIDE 0.9% FLUSH
10.0000 mL | INTRAVENOUS | Status: DC | PRN
  Administered 2017-12-09: 10 mL via INTRAVENOUS
  Filled 2017-12-09: qty 10

## 2017-12-09 NOTE — Patient Instructions (Signed)
Implanted Port Home Guide An implanted port is a type of central line that is placed under the skin. Central lines are used to provide IV access when treatment or nutrition needs to be given through a person's veins. Implanted ports are used for long-term IV access. An implanted port may be placed because:  You need IV medicine that would be irritating to the small veins in your hands or arms.  You need long-term IV medicines, such as antibiotics.  You need IV nutrition for a long period.  You need frequent blood draws for lab tests.  You need dialysis.  Implanted ports are usually placed in the chest area, but they can also be placed in the upper arm, the abdomen, or the leg. An implanted port has two main parts:  Reservoir. The reservoir is round and will appear as a small, raised area under your skin. The reservoir is the part where a needle is inserted to give medicines or draw blood.  Catheter. The catheter is a thin, flexible tube that extends from the reservoir. The catheter is placed into a large vein. Medicine that is inserted into the reservoir goes into the catheter and then into the vein.  How will I care for my incision site? Do not get the incision site wet. Bathe or shower as directed by your health care provider. How is my port accessed? Special steps must be taken to access the port:  Before the port is accessed, a numbing cream can be placed on the skin. This helps numb the skin over the port site.  Your health care provider uses a sterile technique to access the port. ? Your health care provider must put on a mask and sterile gloves. ? The skin over your port is cleaned carefully with an antiseptic and allowed to dry. ? The port is gently pinched between sterile gloves, and a needle is inserted into the port.  Only "non-coring" port needles should be used to access the port. Once the port is accessed, a blood return should be checked. This helps ensure that the port  is in the vein and is not clogged.  If your port needs to remain accessed for a constant infusion, a clear (transparent) bandage will be placed over the needle site. The bandage and needle will need to be changed every week, or as directed by your health care provider.  Keep the bandage covering the needle clean and dry. Do not get it wet. Follow your health care provider's instructions on how to take a shower or bath while the port is accessed.  If your port does not need to stay accessed, no bandage is needed over the port.  What is flushing? Flushing helps keep the port from getting clogged. Follow your health care provider's instructions on how and when to flush the port. Ports are usually flushed with saline solution or a medicine called heparin. The need for flushing will depend on how the port is used.  If the port is used for intermittent medicines or blood draws, the port will need to be flushed: ? After medicines have been given. ? After blood has been drawn. ? As part of routine maintenance.  If a constant infusion is running, the port may not need to be flushed.  How long will my port stay implanted? The port can stay in for as long as your health care provider thinks it is needed. When it is time for the port to come out, surgery will be   done to remove it. The procedure is similar to the one performed when the port was put in. When should I seek immediate medical care? When you have an implanted port, you should seek immediate medical care if:  You notice a bad smell coming from the incision site.  You have swelling, redness, or drainage at the incision site.  You have more swelling or pain at the port site or the surrounding area.  You have a fever that is not controlled with medicine.  This information is not intended to replace advice given to you by your health care provider. Make sure you discuss any questions you have with your health care provider. Document  Released: 01/01/2005 Document Revised: 06/09/2015 Document Reviewed: 09/08/2012 Elsevier Interactive Patient Education  2017 [employer]  

## 2017-12-09 NOTE — Progress Notes (Signed)
[facility] OFFICE PROGRESS NOTE  Patient Care Team: Tysinger, Camelia Eng, PA-C as PCP - General (Family Medicine) Nahser, Wonda Cheng, MD as PCP - Cardiology (Cardiology)  HEME/ONC OVERVIEW: 1. Stage III classic Hodgkin lymphoma, subtype NOS due to limited specimen -Bx-proven R cervical LN involvement in 09/2017; PET in 11/2017 showed cervical, mediastinal and upper abdominal LN involement; treatment delayed due to pt traveling on a cruise after diagnosis -Treatment: 1st line ABVD, 11/26/2017 - present   2. Port placement in 11/2017   ASSESSMENT & PLAN:   Stage III classic Hodgkin lymphoma, favorable risk  -Patient received C1D1 on 12/25/2017; he tolerated it relatively well except GI cramps and mild dysuria (see below) -Clinically, the cervical LN's have normalized in size, which is very assuring  -Labs appropriate to proceed with C1D15 today; neutropenia not an indication to delay treatment  -We will plan to repeat PET scan after 2 cycles to assess disease response (tentatively between 01/13/2018 and 01/17/2018); if Deauville score 1-3, then we can de-escalate to AVD x 4 cycles  -PRN anti-emetics: Zofran, Compazine, dexamethasone and Ativan  Dysuria -Patient reports mild dysuria for the past 2 weeks but denies any hematuria, back pain or fever -Given its persistence without any other associated symptom, there is low suspicion for UTI -We will order UA and urine culture today, but hold off abx unless the urine studies confirm any infection   Chemotherapy-associated neutropenia  -No definitive symptom to suggest infection  -WBC 2.1k w/ ANC of 200 -Neutropenia not an indication to delay treatment for ABVD; G-CSF not indicated unless severe infection is suspected  -The patient was counseled on any concerning symptoms, such as fever (temperature > 100.4), for which he should contact the clinic promptly  Abdominal cramping -Likely secondary to chemotherapy -Patient denies any  significant abdominal pain, vomiting, or diarrhea  -Continue supportive therapy for now   Orders Placed This Encounter  Procedures  . Culture, Urine    Standing Status:   Future    Standing Expiration Date:   12/10/2018  . NM PET Image Restag (PS) Skull Base To Thigh    Standing Status:   Future    Standing Expiration Date:   12/10/2018    Scheduling Instructions:     Pls schedule between 01/13/2018 and 01/17/2018. Thanks.    Order Specific Question:   If indicated for the ordered procedure, I authorize the administration of a radiopharmaceutical per Radiology protocol    Answer:   Yes    Order Specific Question:   Preferred imaging location?    Answer:   [facility]    Order Specific Question:   Radiology Contrast Protocol - do NOT remove file path    Answer:   [email or web address]  . Urinalysis, Complete w Microscopic    Standing Status:   Future    Standing Expiration Date:   12/11/2018   All questions were answered. The patient knows to call the clinic with any problems, questions or concerns. No barriers to learning was detected.  A total of more than 25 minutes were spent face-to-face with the patient during this encounter and over half of that time was spent on counseling and coordination of care as outlined above.   Return on 12/23/2017 for labs and clinic follow-up with me, and 12/24/2017 for C2D1 of ABVD.  Return on 01/06/2018 for labs and clinic follow-up with NP Laverna Peace, and 01/07/2018 for C2D15 of ABVD.  [name], MD 12/10/2017 12:31 PM  CHIEF COMPLAINT: "I  am here for labs"  INTERVAL HISTORY: [name] returns to clinic for labs and follow-up prior to C1D15 of ABVD.  He reports that the day after he received the first dose of chemotherapy, he began to develop mild burning with urination.  Since then, he has had mild intermittent dysuria for the past 2 weeks, but denies any fever, hematuria, or back pain.  He only drinks 1 bottle  of water per day.  He experienced mild to moderate GI cramping after the first dose of chemotherapy, but they resolved after a few days.  He denied any significant abdominal pain, nausea, vomiting, diarrhea.  SUMMARY OF ONCOLOGIC HISTORY:   Hodgkin lymphoma ([place])   08/29/2017 Initial Diagnosis    Hodgkin lymphoma ([place])    10/07/2017 Imaging    CT neck w/ contrast: Enlarged nodes in the right jugular chain, posterior triangle, and supraclavicular fossa. The pattern is most concerning for lymphoma; multiple nodes are superficial and amenable to biopsy.  CT CAP w/ contrast: 1. Right supraclavicular and subcarinal adenopathy is identified. Differential considerations include lymphoproliferative disorder, metastatic adenopathy, granulomatous inflammation/infection or reactive adenopathy. Consider further evaluation with tissue sampling. 2. No acute findings, mass or adenopathy within the abdomen. 3. Lungs are clear.  No evidence for pneumonia.  4.  Aortic Atherosclerosis (ICD10-I70.0).    10/25/2017 Procedure    US-guided R cervical LN biopsy    10/25/2017 Pathology Results    (Accession: [id number]) Lymph node, needle/core biopsy, Right Cervical - CLASSIC HODGKIN LYMPHOMA - SEE COMMENT - CD30 (1%) - Unable to subtype due to limited sample specimen    11/12/2017 Cancer Staging    Staging form: Hodgkin and Non-Hodgkin Lymphoma, AJCC 8th Edition - Clinical stage from 11/12/2017: Stage II (Hodgkin lymphoma, A - Asymptomatic) - Signed by [name], MD on 11/12/2017    11/21/2017 -  Chemotherapy    The patient had DOXOrubicin (ADRIAMYCIN) chemo injection 60 mg, 25 mg/m2 = 60 mg, Intravenous,  Once, 0 of 4 cycles palonosetron (ALOXI) injection 0.25 mg, 0.25 mg, Intravenous,  Once, 0 of 4 cycles bleomycin (BLEOCIN) 24 Units in sodium chloride 0.9 % 50 mL chemo infusion, 10 Units/m2 = 24 Units, Intravenous,  Once, 0 of 4 cycles dacarbazine (DTIC) 910 mg in sodium chloride 0.9 % 250 mL chemo  infusion, 375 mg/m2 = 910 mg, Intravenous,  Once, 0 of 4 cycles vinBLAStine (VELBAN) 14.6 mg in sodium chloride 0.9 % 50 mL chemo infusion, 6 mg/m2 = 14.6 mg, Intravenous, Once, 0 of 4 cycles fosaprepitant (EMEND) 150 mg, dexamethasone (DECADRON) 12 mg in sodium chloride 0.9 % 145 mL IVPB, , Intravenous,  Once, 0 of 4 cycles  for chemotherapy treatment.     11/21/2017 Imaging    PET: IMPRESSION: 1. Multiple relatively small hypermetabolic lymph nodes in the RIGHT neck, mediastinum and limited involvement of the upper abdomen. Lymph node activity is high (greater than liver, Deauville 4). 2. The most intense enlarged lymph node is anterior to the sternocleidomastoid muscle on the RIGHT. There are multiple assessable lymph nodes in the RIGHT cervical chain. 3. Minimal hypermetabolic adenopathy in the upper abdomen. No pelvic or inguinal metastatic adenopathy. 4. Normal spleen and bone marrow. 5. Single focus of metabolic activity in the sigmoid colon. Recommend screening colonoscopy if patient is not current for such.     REVIEW OF SYSTEMS:   Constitutional: ( - ) fevers, ( - )  chills , ( - ) night sweats Eyes: ( - ) blurriness of vision, ( - )  double vision, ( - ) watery eyes Ears, nose, mouth, throat, and face: ( - ) mucositis, ( - ) sore throat Respiratory: ( - ) cough, ( - ) dyspnea, ( - ) wheezes Cardiovascular: ( - ) palpitation, ( - ) chest discomfort, ( - ) lower extremity swelling Gastrointestinal:  ( - ) nausea, ( - ) heartburn, ( - ) change in bowel habits Skin: ( - ) abnormal skin rashes Lymphatics: ( - ) easy bruising Neurological: ( - ) numbness, ( - ) tingling, ( - ) new weaknesses Behavioral/Psych: ( - ) mood change, ( - ) new changes  All other systems were reviewed with the patient and are negative.  I have reviewed the past medical history, past surgical history, social history and family history with the patient and they are unchanged from previous note.  ALLERGIES:   has No Known Allergies.  MEDICATIONS:  Current Outpatient Medications  Medication Sig Dispense Refill  . aspirin EC 81 MG tablet Take 1 tablet (81 mg total) by mouth daily. 90 tablet 3  . atorvastatin (LIPITOR) 20 MG tablet TAKE 1 TABLET BY MOUTH DAILY 90 tablet 2  . carvedilol (COREG) 6.25 MG tablet Take 1 tablet (6.25 mg total) by mouth 2 (two) times daily. 180 tablet 3  . Cholecalciferol (VITAMIN D PO) Take 1 capsule by mouth at bedtime.    Marland Kitchen dexamethasone (DECADRON) 4 MG tablet Take 2 tablets by mouth once a day on the day after chemotherapy and then take 2 tablets two times a day for 2 days. Take with food. 30 tablet 1  . diclofenac (VOLTAREN) 75 MG EC tablet Take 75 mg by mouth daily.    Marland Kitchen ibuprofen (ADVIL,MOTRIN) 200 MG tablet Take 400 mg by mouth daily as needed for moderate pain.    Marland Kitchen lidocaine-prilocaine (EMLA) cream Apply to affected area once 30 g 3  . LORazepam (ATIVAN) 0.5 MG tablet Take 1 tablet (0.5 mg total) by mouth every 6 (six) hours as needed (Nausea or vomiting). 30 tablet 0  . losartan (COZAAR) 100 MG tablet Take 1 tablet (100 mg total) by mouth daily. 90 tablet 3  . ondansetron (ZOFRAN) 8 MG tablet Take 1 tablet (8 mg total) by mouth 2 (two) times daily as needed. Start on the third day after chemotherapy. 30 tablet 1  . prochlorperazine (COMPAZINE) 10 MG tablet Take 1 tablet (10 mg total) by mouth every 6 (six) hours as needed (Nausea or vomiting). 30 tablet 1  . ranitidine (ZANTAC) 150 MG tablet Take 150 mg by mouth daily as needed for heartburn.     No current facility-administered medications for this visit.    Facility-Administered Medications Ordered in Other Visits  Medication Dose Route Frequency Provider Last Rate Last Dose  . bleomycin (BLEOCIN) 24 Units in sodium chloride 0.9 % 50 mL chemo infusion  10 Units/m2 (Treatment Plan Recorded) Intravenous Once [name], MD      . dacarbazine (DTIC) 900 mg in sodium chloride 0.9 % 250 mL chemo infusion  370 mg/m2  (Treatment Plan Recorded) Intravenous Once [name], MD      . DOXOrubicin (ADRIAMYCIN) chemo injection 60 mg  25 mg/m2 (Treatment Plan Recorded) Intravenous Once [name], MD      . heparin lock flush 100 unit/mL  500 Units Intracatheter Once PRN [name], MD      . sodium chloride flush (NS) 0.9 % injection 10 mL  10 mL Intracatheter PRN [name], MD      .  vinBLAStine (VELBAN) 15 mg in sodium chloride 0.9 % 50 mL chemo infusion  15 mg Intravenous Once [name], MD        PHYSICAL EXAMINATION: ECOG PERFORMANCE STATUS: 0 - Asymptomatic  Today's Vitals   12/10/17 1130  PainSc: 0-No pain   There is no height or weight on file to calculate BMI.  There were no vitals filed for this visit.   Wt Readings from Last 3 Encounters:  11/20/17 259 lb (117.5 kg)  11/12/17 259 lb 6.4 oz (117.7 kg)  11/11/17 259 lb (117.5 kg)   Temp Readings from Last 3 Encounters:  11/26/17 98.8 F (37.1 C) (Oral)  11/25/17 98.2 F (36.8 C) (Oral)   BP Readings from Last 3 Encounters:  12/09/17 (!) 152/82  11/26/17 (!) 147/75  11/25/17 138/84   Pulse Readings from Last 3 Encounters:  12/09/17 64  11/26/17 72  11/25/17 76   GENERAL: alert, no distress and comfortable SKIN: skin color, texture, turgor are normal, no rashes or significant lesions EYES: conjunctiva are pink and non-injected, sclera clear OROPHARYNX: no exudate, no erythema; lips, buccal mucosa, and tongue normal  NECK: supple, non-tender LYMPH:  previous cervical lymphadenopathy had resolved, no axillary lymphadenopathy  LUNGS: clear to auscultation and percussion with normal breathing effort HEART: regular rate & rhythm and no murmurs and no lower extremity edema ABDOMEN: soft, non-tender, non-distended, normal bowel sounds Musculoskeletal: no cyanosis of digits and no clubbing  PSYCH: alert & oriented x 3, fluent speech NEURO: no focal motor/sensory deficits  LABORATORY DATA:  I have reviewed the data as listed     Component Value Date/Time   NA 136 12/09/2017 1055   NA 141 08/29/2017 1155   K 3.7 12/09/2017 1055   CL 107 12/09/2017 1055   CO2 28 12/09/2017 1055   GLUCOSE 105 12/09/2017 1055   BUN 14 12/09/2017 1055   BUN 16 08/29/2017 1155   CREATININE 0.90 12/09/2017 1055   CREATININE 0.83 09/27/2014 0001   CALCIUM 9.2 12/09/2017 1055   PROT 6.9 12/09/2017 1055   PROT 7.4 08/29/2017 1155   ALBUMIN 3.5 12/09/2017 1055   ALBUMIN 4.1 08/29/2017 1155   AST 21 12/09/2017 1055   ALT 26 12/09/2017 1055   ALKPHOS 72 12/09/2017 1055   BILITOT 0.6 12/09/2017 1055   GFRNONAA >60 11/12/2017 0931   GFRAA >60 11/12/2017 0931    No results found for: SPEP, UPEP  Lab Results  Component Value Date   WBC 2.1 (L) 12/09/2017   NEUTROABS 0.2 (LL) 12/09/2017   HGB 12.4 (L) 12/09/2017   HCT 38.0 (L) 12/09/2017   MCV 88.6 12/09/2017   PLT 291 12/09/2017      Chemistry      Component Value Date/Time   NA 136 12/09/2017 1055   NA 141 08/29/2017 1155   K 3.7 12/09/2017 1055   CL 107 12/09/2017 1055   CO2 28 12/09/2017 1055   BUN 14 12/09/2017 1055   BUN 16 08/29/2017 1155   CREATININE 0.90 12/09/2017 1055   CREATININE 0.83 09/27/2014 0001      Component Value Date/Time   CALCIUM 9.2 12/09/2017 1055   ALKPHOS 72 12/09/2017 1055   AST 21 12/09/2017 1055   ALT 26 12/09/2017 1055   BILITOT 0.6 12/09/2017 1055       RADIOGRAPHIC STUDIES: I have personally reviewed the radiological images as listed below and agreed with the findings in the report. Nm Pet Image Initial (pi) Skull Base To Thigh  Result Date: 11/21/2017  CLINICAL DATA:  Initial treatment strategy for Hodgkin's lymphoma. EXAM: NUCLEAR MEDICINE PET SKULL BASE TO THIGH TECHNIQUE: 12.2 mCi F-18 FDG was injected intravenously. Full-ring PET imaging was performed from the skull base to thigh after the radiotracer. CT data was obtained and used for attenuation correction and anatomic localization. Fasting blood glucose: 102 mg/dl  COMPARISON:  Chest CT 10/07/2017 FINDINGS: Mediastinal blood pool activity: SUV max 2.99 Liver activity: SUV max 4.1 NECK: chain of hypermetabolic nodules in the RIGHT neck extending from level 2 nodal station to level 5 nodal station. Example lymph node anterior to the RIGHT sternocleidomastoid muscle measures 18 mm short axis with SUV max equal 8.2. A cluster of level 4 lymph nodes along the margin of the sternocleidomastoid muscle with SUV max equal 7.8. Nodes extend to the RIGHT supraclavicular nodal station with lymph node with SUV max equal 8.5. Incidental CT findings: none CHEST: There is mildly enlarged hypermetabolic mediastinal and subcarinal lymph nodes. For example subcarinal lymph node with SUV max equal 7.9 measures 2 cm short axis. Most of these hypermetabolic mediastinal nodes are approximately 1 cm. No hypermetabolic axillary lymph nodes. Incidental CT findings: No suspicious pulmonary nodules ABDOMEN/PELVIS: Minimal enlarged hypermetabolic lymph nodes in the abdomen pelvis. Lymph node position between the aorta and IVC in the upper abdomen measures 15 mm short axis with SUV max equal 5.3. Mild activity associated small retroperitoneal lymph nodes (SUV max equal 3.5). No hypermetabolic inguinal lymph nodes or iliac lymph nodes. Spleen is normal volume normal and metabolic activity. No abnormal activity in liver. A single focus of intense metabolic activity associated with the mid sigmoid colon with SUV max equal 13.7. There is a subtle potential a focus within the bowel wall measuring 10 mm (image 178/4). This is a term Incidental CT findings: none SKELETON: No focal hypermetabolic activity to suggest skeletal metastasis. Incidental CT findings: none IMPRESSION: 1. Multiple relatively small hypermetabolic lymph nodes in the RIGHT neck, mediastinum and limited involvement of the upper abdomen. Lymph node activity is high (greater than liver, Deauville 4). 2. The most intense enlarged lymph node is  anterior to the sternocleidomastoid muscle on the RIGHT. There are multiple assessable lymph nodes in the RIGHT cervical chain. 3. Minimal hypermetabolic adenopathy in the upper abdomen. No pelvic or inguinal metastatic adenopathy. 4. Normal spleen and bone marrow. 5. Single focus of metabolic activity in the sigmoid colon. Recommend screening colonoscopy if patient is not current for such. Electronically Signed   By: Suzy Bouchard M.D.   On: 11/21/2017 14:04   [facility] Imaging Guided Port Insertion  Result Date: 11/20/2017 INDICATION: 67 year old with Hodgkin's lymphoma. Port-A-Cath needed for treatment. EXAM: FLUOROSCOPIC AND ULTRASOUND GUIDED PLACEMENT OF A SUBCUTANEOUS PORT. Physician: Stephan Minister. Anselm Pancoast, MD MEDICATIONS: As antibiotic prophylaxis, Ancef 2 gm was ordered pre-procedure and administered intravenously within one hour of incision. ANESTHESIA/SEDATION: Versed 4.0 mg IV; Fentanyl 100 mcg IV; Moderate Sedation Time:  55 minutes The patient was continuously monitored during the procedure by the interventional radiology nurse under my direct supervision. FLUOROSCOPY TIME:  4 minutes and 30 seconds, 66 mGy COMPLICATIONS: None immediate. PROCEDURE: The procedure was explained to the patient. The risks and benefits of the procedure were discussed and the patient's questions were addressed. Informed consent was obtained from the patient. Patient was placed supine on the interventional table. Left side of the neck was selected for port placement due to large number of enlarged lymph nodes around the right internal jugular vein. Ultrasound confirmed a patent left internal jugular  vein. Ultrasound image was saved for documentation. The left chest and neck were cleaned with a skin antiseptic and a sterile drape was placed. Maximal barrier sterile technique was utilized including caps, mask, sterile gowns, sterile gloves, sterile drape, hand hygiene and skin antiseptic. The left neck was anesthetized with 1% lidocaine.  Small incision was made in the left neck with a blade. Micropuncture set was placed in the left IJ with ultrasound guidance. The wire was repeatedly going up the neck. Eventually, the wire was advanced towards the central veins after the needle was directed more laterally. The left chest was anesthetized with 1% lidocaine with epinephrine. #15 blade was used to make an incision and a subcutaneous port pocket was formed. [place] was selected. Subcutaneous tunnel was formed with a stiff tunneling device. The port catheter was brought through the subcutaneous tunnel. J wire was advanced down the micropuncture catheter and a peel-away sheath was placed. Catheter was advanced through the peel-away sheath and positioned at the superior cavoatrial junction. Catheter was cut to an appropriate length and attached to the port. However, during placement of the port into the subcutaneous tunnel, the catheter got pulled back into the left innominate vein and could not be repositioned back into the lower SVC. As a result, the catheter was pulled out through the neck access site and removed over a Bentson wire. A new port device was required. A peel-away sheath was advanced through the existing left jugular access. New port catheter was tunneled through the subcutaneous tissues. Catheter was advanced through the peel-away sheath and positioned with the tip at the SVC and right atrium junction. Catheter was cut and attached to a new port. The port was successfully placed within the subcutaneous pocket. Port was accessed and found to aspirate and flush well. Port was flushed with heparinized saline. Catheter placement was confirmed with fluoroscopy. The port pocket was closed using two layers of absorbable sutures and Dermabond. The vein skin site was closed using a single layer of absorbable suture and Dermabond. Sterile dressings were applied. Patient tolerated the procedure well without an immediate complication.  Ultrasound and fluoroscopic images were taken and saved for this procedure. IMPRESSION: Placement of a subcutaneous CT injectable port device. Catheter tip at the SVC and right atrium junction. Electronically Signed   By: Markus Daft M.D.   On: 11/20/2017 17:35

## 2017-12-10 ENCOUNTER — Encounter: Payer: Self-pay | Admitting: Hematology

## 2017-12-10 ENCOUNTER — Other Ambulatory Visit: Payer: Medicare Other

## 2017-12-10 ENCOUNTER — Inpatient Hospital Stay: Payer: Medicare Other

## 2017-12-10 ENCOUNTER — Other Ambulatory Visit: Payer: Self-pay

## 2017-12-10 ENCOUNTER — Other Ambulatory Visit: Payer: Self-pay | Admitting: Oncology

## 2017-12-10 ENCOUNTER — Inpatient Hospital Stay (HOSPITAL_BASED_OUTPATIENT_CLINIC_OR_DEPARTMENT_OTHER): Payer: Medicare Other | Admitting: Hematology

## 2017-12-10 DIAGNOSIS — R590 Localized enlarged lymph nodes: Secondary | ICD-10-CM

## 2017-12-10 DIAGNOSIS — D701 Agranulocytosis secondary to cancer chemotherapy: Secondary | ICD-10-CM

## 2017-12-10 DIAGNOSIS — Z5111 Encounter for antineoplastic chemotherapy: Secondary | ICD-10-CM | POA: Diagnosis not present

## 2017-12-10 DIAGNOSIS — R109 Unspecified abdominal pain: Secondary | ICD-10-CM | POA: Diagnosis not present

## 2017-12-10 DIAGNOSIS — R490 Dysphonia: Secondary | ICD-10-CM | POA: Diagnosis not present

## 2017-12-10 DIAGNOSIS — Z79899 Other long term (current) drug therapy: Secondary | ICD-10-CM | POA: Diagnosis not present

## 2017-12-10 DIAGNOSIS — R300 Dysuria: Secondary | ICD-10-CM | POA: Insufficient documentation

## 2017-12-10 DIAGNOSIS — T451X5A Adverse effect of antineoplastic and immunosuppressive drugs, initial encounter: Secondary | ICD-10-CM | POA: Insufficient documentation

## 2017-12-10 DIAGNOSIS — Z7982 Long term (current) use of aspirin: Secondary | ICD-10-CM | POA: Diagnosis not present

## 2017-12-10 DIAGNOSIS — C8190 Hodgkin lymphoma, unspecified, unspecified site: Secondary | ICD-10-CM

## 2017-12-10 LAB — URINALYSIS, COMPLETE (UACMP) WITH MICROSCOPIC
Bilirubin Urine: NEGATIVE
Glucose, UA: NEGATIVE mg/dL
Hgb urine dipstick: NEGATIVE
Ketones, ur: 15 mg/dL — AB
Leukocytes, UA: NEGATIVE
Nitrite: POSITIVE — AB
Protein, ur: NEGATIVE mg/dL
pH: 5 (ref 5.0–8.0)

## 2017-12-10 MED ORDER — HEPARIN SOD (PORK) LOCK FLUSH 100 UNIT/ML IV SOLN
500.0000 [IU] | Freq: Once | INTRAVENOUS | Status: AC | PRN
  Administered 2017-12-10: 500 [IU]
  Filled 2017-12-10: qty 5

## 2017-12-10 MED ORDER — SODIUM CHLORIDE 0.9 % IV SOLN
Freq: Once | INTRAVENOUS | Status: AC
  Administered 2017-12-10: 12:00:00 via INTRAVENOUS
  Filled 2017-12-10: qty 5

## 2017-12-10 MED ORDER — DOXORUBICIN HCL CHEMO IV INJECTION 2 MG/ML
25.0000 mg/m2 | Freq: Once | INTRAVENOUS | Status: AC
  Administered 2017-12-10: 60 mg via INTRAVENOUS
  Filled 2017-12-10: qty 30

## 2017-12-10 MED ORDER — PALONOSETRON HCL INJECTION 0.25 MG/5ML
0.2500 mg | Freq: Once | INTRAVENOUS | Status: AC
  Administered 2017-12-10: 0.25 mg via INTRAVENOUS

## 2017-12-10 MED ORDER — SODIUM CHLORIDE 0.9 % IV SOLN
10.0000 [IU]/m2 | Freq: Once | INTRAVENOUS | Status: AC
  Administered 2017-12-10: 24 [IU] via INTRAVENOUS
  Filled 2017-12-10: qty 8

## 2017-12-10 MED ORDER — PALONOSETRON HCL INJECTION 0.25 MG/5ML
INTRAVENOUS | Status: AC
  Filled 2017-12-10: qty 5

## 2017-12-10 MED ORDER — SODIUM CHLORIDE 0.9 % IV SOLN
370.0000 mg/m2 | Freq: Once | INTRAVENOUS | Status: AC
  Administered 2017-12-10: 900 mg via INTRAVENOUS
  Filled 2017-12-10: qty 90

## 2017-12-10 MED ORDER — VINBLASTINE SULFATE CHEMO INJECTION 1 MG/ML
15.0000 mg | Freq: Once | INTRAVENOUS | Status: AC
  Administered 2017-12-10: 15 mg via INTRAVENOUS
  Filled 2017-12-10: qty 15

## 2017-12-10 MED ORDER — SODIUM CHLORIDE 0.9% FLUSH
10.0000 mL | INTRAVENOUS | Status: DC | PRN
  Administered 2017-12-10: 10 mL
  Filled 2017-12-10: qty 10

## 2017-12-10 MED ORDER — SODIUM CHLORIDE 0.9 % IV SOLN
Freq: Once | INTRAVENOUS | Status: AC
  Administered 2017-12-10: 12:00:00 via INTRAVENOUS
  Filled 2017-12-10: qty 250

## 2017-12-10 NOTE — Patient Instructions (Signed)
Aprepitant capsules What is this medicine? APREPITANT (ap RE pi tant) is used with other medicines to prevent nausea and vomiting caused by cancer treatment (chemotherapy). It is also used alone to prevent nausea and vomiting caused by anesthesia used during surgery. This medicine may be used for other purposes; ask your health care provider or pharmacist if you have questions. COMMON BRAND NAME(S): Emend What should I tell my health care provider before I take this medicine? They need to know if you have any of these conditions: -liver disease -an unusual or allergic reaction to aprepitant, fosaprepitant, other medicines, foods, dyes, or preservatives -pregnant or trying to get pregnant -breast-feeding How should I use this medicine? Take this medicine by mouth with a glass of water. Follow the directions on the prescription label. Usually, you will take your first dose one hour before your chemotherapy begins, and then once daily in the morning for the next 2 days after your chemotherapy treatment. This medicine may be taken with or without food. Do not take more often than directed. Talk to your pediatrician regarding the use of this medicine in children. While this drug may be prescribed for children as young as 12 years for selected conditions, precautions do apply. Overdosage: If you think you have taken too much of this medicine contact a poison control center or emergency room at once. NOTE: This medicine is only for you. Do not share this medicine with others. What if I miss a dose? If you miss a dose, take it as soon as you can. If it is almost time for your next dose, take only that dose. Do not take double or extra doses. What may interact with this medicine? Do not take this medicine with any of these medicines: -cisapride -flibanserin -lomitapide -pimozide This medicine may also interact with the following medications: -diltiazem -male hormones, like estrogens or progestins  and birth control pills -medicines for fungal infections like ketoconazole and itraconazole -medicines for HIV -medicines for seizures or to control epilepsy like carbamazepine or phenytoin -medicines used for sleep or anxiety disorders like alprazolam, diazepam, or midazolam -nefazodone -paroxetine -ranolazine -rifampin -some chemotherapy medications like etoposide, ifosfamide, vinblastine, vincristine -some antibiotics like clarithromycin, erythromycin, troleandomycin -steroid medicines like dexamethasone or methylprednisolone -tolbutamide -warfarin This list may not describe all possible interactions. Give your health care provider a list of all the medicines, herbs, non-prescription drugs, or dietary supplements you use. Also tell them if you smoke, drink alcohol, or use illegal drugs. Some items may interact with your medicine. What should I watch for while using this medicine? Do not take this medicine if you already have nausea and vomiting. Ask your health care provider what to do if you already have nausea. Birth control pills and other methods of hormonal contraception (for example, IUD or patch) may not work properly while you are taking this medicine. Use an extra method of birth control during treatment and for 1 month after your last dose of aprepitant. This medicine should not be used continuously for a long time. Visit your doctor or health care professional for regular check-ups. This medicine may change your liver function blood test results. What side effects may I notice from receiving this medicine? Side effects that you should report to your doctor or health care professional as soon as possible: -allergic reactions like skin rash, itching or hives, swelling of the face, lips, or tongue -breathing problems -changes in heart rhythm -high or low blood pressure -rectal bleeding -serious dizziness or disorientation, confusion -  sharp or severe stomach pain -sharp pain in  your leg Side effects that usually do not require medical attention (report to your doctor or health care professional if they continue or are bothersome): -constipation or diarrhea -hair loss -headache -hiccups -loss of appetite -nausea -upset stomach -tiredness This list may not describe all possible side effects. Call your doctor for medical advice about side effects. You may report side effects to FDA at 1-800-FDA-1088. Where should I keep my medicine? Keep out of the reach of children. Store at room temperature between 20 and 25 degrees C (68 and 77 degrees F). Throw away any unused medicine after the expiration date. NOTE: This sheet is a summary. It may not cover all possible information. If you have questions about this medicine, talk to your doctor, pharmacist, or health care provider.  2018 Elsevier/[employer] Standard (2014-02-17 09:59:34) Palonosetron Injection What is this medicine? PALONOSETRON (pal oh NOE se tron) is used to prevent nausea and vomiting caused by chemotherapy. It also helps prevent delayed nausea and vomiting that may occur a few days after your treatment. This medicine may be used for other purposes; ask your health care provider or pharmacist if you have questions. COMMON BRAND NAME(S): Aloxi What should I tell my health care provider before I take this medicine? They need to know if you have any of these conditions: -an unusual or allergic reaction to palonosetron, dolasetron, granisetron, ondansetron, other medicines, foods, dyes, or preservatives -pregnant or trying to get pregnant -breast-feeding How should I use this medicine? This medicine is for infusion into a vein. It is given by a health care professional in a hospital or clinic setting. Talk to your pediatrician regarding the use of this medicine in children. While this drug may be prescribed for children as young as 1 month for selected conditions, precautions do apply. Overdosage: If you think you  have taken too much of this medicine contact a poison control center or emergency room at once. NOTE: This medicine is only for you. Do not share this medicine with others. What if I miss a dose? This does not apply. What may interact with this medicine? -certain medicines for depression, anxiety, or psychotic disturbances -fentanyl -linezolid -MAOIs like Carbex, Eldepryl, Marplan, Nardil, and Parnate -methylene blue (injected into a vein) -tramadol This list may not describe all possible interactions. Give your health care provider a list of all the medicines, herbs, non-prescription drugs, or dietary supplements you use. Also tell them if you smoke, drink alcohol, or use illegal drugs. Some items may interact with your medicine. What should I watch for while using this medicine? Your condition will be monitored carefully while you are receiving this medicine. What side effects may I notice from receiving this medicine? Side effects that you should report to your doctor or health care professional as soon as possible: -allergic reactions like skin rash, itching or hives, swelling of the face, lips, or tongue -breathing problems -confusion -dizziness -fast, irregular heartbeat -fever and chills -loss of balance or coordination -seizures -sweating -swelling of the hands and feet -tremors -unusually weak or tired Side effects that usually do not require medical attention (report to your doctor or health care professional if they continue or are bothersome): -constipation or diarrhea -headache This list may not describe all possible side effects. Call your doctor for medical advice about side effects. You may report side effects to FDA at 1-800-FDA-1088. Where should I keep my medicine? This drug is given in a hospital or clinic and  will not be stored at home. NOTE: This sheet is a summary. It may not cover all possible information. If you have questions about this medicine, talk to  your doctor, pharmacist, or health care provider.  2018 Else[employer]/Gold Standard (2012-11-07 10:38:36) Dacarbazine, DTIC injection What is this medicine? DACARBAZINE (da KAR ba zeen) is a chemotherapy drug. This medicine is used to treat skin cancer. It is also used with other medicines to treat Hodgkin's disease. This medicine may be used for other purposes; ask your health care provider or pharmacist if you have questions. COMMON BRAND NAME(S): DTIC-Dome What should I tell my health care provider before I take this medicine? They need to know if you have any of these conditions: -infection (especially virus infection such as chickenpox, cold sores, or herpes) -kidney disease -liver disease -low blood counts like low platelets, red blood cells, white blood cells -recent radiation therapy -an unusual or allergic reaction to dacarbazine, other chemotherapy agents, other medicines, foods, dyes, or preservatives -pregnant or trying to get pregnant -breast-feeding How should I use this medicine? This drug is given as an injection or infusion into a vein. It is administered in a hospital or clinic by a specially trained health care professional. Talk to your pediatrician regarding the use of this medicine in children. While this drug may be prescribed for selected conditions, precautions do apply. Overdosage: If you think you have taken too much of this medicine contact a poison control center or emergency room at once. NOTE: This medicine is only for you. Do not share this medicine with others. What if I miss a dose? It is important not to miss your dose. Call your doctor or health care professional if you are unable to keep an appointment. What may interact with this medicine? -medicines to increase blood counts like filgrastim, pegfilgrastim, sargramostim -vaccines This list may not describe all possible interactions. Give your health care provider a list of all the medicines, herbs,  non-prescription drugs, or dietary supplements you use. Also tell them if you smoke, drink alcohol, or use illegal drugs. Some items may interact with your medicine. What should I watch for while using this medicine? Your condition will be monitored carefully while you are receiving this medicine. You will need important blood work done while you are taking this medicine. This drug may make you feel generally unwell. This is not uncommon, as chemotherapy can affect healthy cells as well as cancer cells. Report any side effects. Continue your course of treatment even though you feel ill unless your doctor tells you to stop. Call your doctor or health care professional for advice if you get a fever, chills or sore throat, or other symptoms of a cold or flu. Do not treat yourself. This drug decreases your body's ability to fight infections. Try to avoid being around people who are sick. This medicine may increase your risk to bruise or bleed. Call your doctor or health care professional if you notice any unusual bleeding. Talk to your doctor about your risk of cancer. You may be more at risk for certain types of cancers if you take this medicine. Do not become pregnant while taking this medicine. Women should inform their doctor if they wish to become pregnant or think they might be pregnant. There is a potential for serious side effects to an unborn child. Talk to your health care professional or pharmacist for more information. Do not breast-feed an infant while taking this medicine. What side effects may I notice from  receiving this medicine? Side effects that you should report to your doctor or health care professional as soon as possible: -allergic reactions like skin rash, itching or hives, swelling of the face, lips, or tongue -low blood counts - this medicine may decrease the number of white blood cells, red blood cells and platelets. You may be at increased risk for infections and bleeding. -signs  of infection - fever or chills, cough, sore throat, pain or difficulty passing urine -signs of decreased platelets or bleeding - bruising, pinpoint red spots on the skin, black, tarry stools, blood in the urine -signs of decreased red blood cells - unusually weak or tired, fainting spells, lightheadedness -breathing problems -muscle pains -pain at site where injected -trouble passing urine or change in the amount of urine -vomiting -yellowing of the eyes or skin Side effects that usually do not require medical attention (report to your doctor or health care professional if they continue or are bothersome): -diarrhea -hair loss -loss of appetite -nausea -skin more sensitive to sun or ultraviolet light -stomach upset This list may not describe all possible side effects. Call your doctor for medical advice about side effects. You may report side effects to FDA at 1-800-FDA-1088. Where should I keep my medicine? This drug is given in a hospital or clinic and will not be stored at home. NOTE: This sheet is a summary. It may not cover all possible information. If you have questions about this medicine, talk to your doctor, pharmacist, or health care provider.  2018 Else[employer]/Gold Standard (2015-03-04 15:17:39) Vinblastine injection What is this medicine? VINBLASTINE (vin BLAS teen) is a chemotherapy drug. It slows the growth of cancer cells. This medicine is used to treat many types of cancer like breast cancer, testicular cancer, Hodgkin's disease, non-Hodgkin's lymphoma, and sarcoma. This medicine may be used for other purposes; ask your health care provider or pharmacist if you have questions. COMMON BRAND NAME(S): Velban What should I tell my health care provider before I take this medicine? They need to know if you have any of these conditions: -blood disorders -dental disease -gout -infection (especially a virus infection such as chickenpox, cold sores, or herpes) -liver  disease -lung disease -nervous system disease -recent or ongoing radiation therapy -an unusual or allergic reaction to vinblastine, other chemotherapy agents, other medicines, foods, dyes, or preservatives -pregnant or trying to get pregnant -breast-feeding How should I use this medicine? This drug is given as an infusion into a vein. It is administered in a hospital or clinic by a specially trained health care professional. If you have pain, swelling, burning or any unusual feeling around the site of your injection, tell your health care professional right away. Talk to your pediatrician regarding the use of this medicine in children. While this drug may be prescribed for selected conditions, precautions do apply. Overdosage: If you think you have taken too much of this medicine contact a poison control center or emergency room at once. NOTE: This medicine is only for you. Do not share this medicine with others. What if I miss a dose? It is important not to miss your dose. Call your doctor or health care professional if you are unable to keep an appointment. What may interact with this medicine? Do not take this medicine with any of the following medications: -erythromycin -itraconazole -mibefradil -voriconazole This medicine may also interact with the following medications: -cyclosporine -fluconazole -ketoconazole -medicines for seizures like phenytoin -medicines to increase blood counts like filgrastim, pegfilgrastim, sargramostim -vaccines -verapamil Talk  to your doctor or health care professional before taking any of these medicines: -acetaminophen -aspirin -ibuprofen -ketoprofen -naproxen This list may not describe all possible interactions. Give your health care provider a list of all the medicines, herbs, non-prescription drugs, or dietary supplements you use. Also tell them if you smoke, drink alcohol, or use illegal drugs. Some items may interact with your medicine. What  should I watch for while using this medicine? Your condition will be monitored carefully while you are receiving this medicine. You will need important blood work done while you are taking this medicine. This drug may make you feel generally unwell. This is not uncommon, as chemotherapy can affect healthy cells as well as cancer cells. Report any side effects. Continue your course of treatment even though you feel ill unless your doctor tells you to stop. In some cases, you may be given additional medicines to help with side effects. Follow all directions for their use. Call your doctor or health care professional for advice if you get a fever, chills or sore throat, or other symptoms of a cold or flu. Do not treat yourself. This drug decreases your body's ability to fight infections. Try to avoid being around people who are sick. This medicine may increase your risk to bruise or bleed. Call your doctor or health care professional if you notice any unusual bleeding. Be careful brushing and flossing your teeth or using a toothpick because you may get an infection or bleed more easily. If you have any dental work done, tell your dentist you are receiving this medicine. Avoid taking products that contain aspirin, acetaminophen, ibuprofen, naproxen, or ketoprofen unless instructed by your doctor. These medicines may hide a fever. Do not become pregnant while taking this medicine. Women should inform their doctor if they wish to become pregnant or think they might be pregnant. There is a potential for serious side effects to an unborn child. Talk to your health care professional or pharmacist for more information. Do not breast-feed an infant while taking this medicine. Men may have a lower sperm count while taking this medicine. Talk to your doctor if you plan to father a child. What side effects may I notice from receiving this medicine? Side effects that you should report to your doctor or health care  professional as soon as possible: -allergic reactions like skin rash, itching or hives, swelling of the face, lips, or tongue -low blood counts - This drug may decrease the number of white blood cells, red blood cells and platelets. You may be at increased risk for infections and bleeding. -signs of infection - fever or chills, cough, sore throat, pain or difficulty passing urine -signs of decreased platelets or bleeding - bruising, pinpoint red spots on the skin, black, tarry stools, nosebleeds -signs of decreased red blood cells - unusually weak or tired, fainting spells, lightheadedness -breathing problems -changes in hearing -change in the amount of urine -chest pain -high blood pressure -mouth sores -nausea and vomiting -pain, swelling, redness or irritation at the injection site -pain, tingling, numbness in the hands or feet -problems with balance, dizziness -seizures Side effects that usually do not require medical attention (report to your doctor or health care professional if they continue or are bothersome): -constipation -hair loss -jaw pain -loss of appetite -sensitivity to light -stomach pain -tumor pain This list may not describe all possible side effects. Call your doctor for medical advice about side effects. You may report side effects to FDA at 1-800-FDA-1088. Where should  I keep my medicine? This drug is given in a hospital or clinic and will not be stored at home. NOTE: This sheet is a summary. It may not cover all possible information. If you have questions about this medicine, talk to your doctor, pharmacist, or health care provider.  2018 Elsevier/Gold Standard (2007-09-29 17:15:59) Bleomycin injection What is this medicine? BLEOMYCIN (blee oh MYE sin) is a chemotherapy drug. It is used to treat many kinds of cancer like lymphoma, cervical cancer, head and neck cancer, and testicular cancer. It is also used to prevent and to treat fluid build-up around the  lungs caused by some cancers. This medicine may be used for other purposes; ask your health care provider or pharmacist if you have questions. COMMON BRAND NAME(S): Blenoxane What should I tell my health care provider before I take this medicine? They need to know if you have any of these conditions: -cigarette smoker -kidney disease -lung disease -recent or ongoing radiation therapy -an unusual or allergic reaction to bleomycin, other chemotherapy agents, other medicines, foods, dyes, or preservatives -pregnant or trying to get pregnant -breast-feeding How should I use this medicine? This drug is given as an infusion into a vein or a body cavity. It can also be given as an injection into a muscle or under the skin. It is administered in a hospital or clinic by a specially trained health care professional. Talk to your pediatrician regarding the use of this medicine in children. Special care may be needed. Overdosage: If you think you have taken too much of this medicine contact a poison control center or emergency room at once. NOTE: This medicine is only for you. Do not share this medicine with others. What if I miss a dose? It is important not to miss your dose. Call your doctor or health care professional if you are unable to keep an appointment. What may interact with this medicine? -certain antibiotics given by injection -cisplatin -cyclosporine -diuretics -foscarnet -medicines to increase blood counts like filgrastim, pegfilgrastim, sargramostim -vaccines This list may not describe all possible interactions. Give your health care provider a list of all the medicines, herbs, non-prescription drugs, or dietary supplements you use. Also tell them if you smoke, drink alcohol, or use illegal drugs. Some items may interact with your medicine. What should I watch for while using this medicine? Visit your doctor for checks on your progress. This drug may make you feel generally unwell.  This is not uncommon, as chemotherapy can affect healthy cells as well as cancer cells. Report any side effects. Continue your course of treatment even though you feel ill unless your doctor tells you to stop. Call your doctor or health care professional for advice if you get a fever, chills or sore throat, or other symptoms of a cold or flu. Do not treat yourself. This drug decreases your body's ability to fight infections. Try to avoid being around people who are sick. Avoid taking products that contain aspirin, acetaminophen, ibuprofen, naproxen, or ketoprofen unless instructed by your doctor. These medicines may hide a fever. Do not become pregnant while taking this medicine. Women should inform their doctor if they wish to become pregnant or think they might be pregnant. There is a potential for serious side effects to an unborn child. Talk to your health care professional or pharmacist for more information. Do not breast-feed an infant while taking this medicine. There is a maximum amount of this medicine you should receive throughout your life. The amount depends on  the medical condition being treated and your overall health. Your doctor will watch how much of this medicine you receive in your lifetime. Tell your doctor if you have taken this medicine before. What side effects may I notice from receiving this medicine? Side effects that you should report to your doctor or health care professional as soon as possible: -allergic reactions like skin rash, itching or hives, swelling of the face, lips, or tongue -breathing problems -chest pain -confusion -cough -fast, irregular heartbeat -feeling faint or lightheaded, falls -fever or chills -mouth sores -pain, tingling, numbness in the hands or feet -trouble passing urine or change in the amount of urine -yellowing of the eyes or skin Side effects that usually do not require medical attention (report to your doctor or health care professional if  they continue or are bothersome): -darker skin color -hair loss -irritation at site where injected -loss of appetite -nail changes -nausea and vomiting -weight loss This list may not describe all possible side effects. Call your doctor for medical advice about side effects. You may report side effects to FDA at 1-800-FDA-1088. Where should I keep my medicine? This drug is given in a hospital or clinic and will not be stored at home. NOTE: This sheet is a summary. It may not cover all possible information. If you have questions about this medicine, talk to your doctor, pharmacist, or health care provider.  2018 [employer] Standard (2012-04-29 09:36:48) Doxorubicin injection What is this medicine? DOXORUBICIN (dox oh ROO bi sin) is a chemotherapy drug. It is used to treat many kinds of cancer like leukemia, lymphoma, neuroblastoma, sarcoma, and Wilms' tumor. It is also used to treat bladder cancer, breast cancer, lung cancer, ovarian cancer, stomach cancer, and thyroid cancer. This medicine may be used for other purposes; ask your health care provider or pharmacist if you have questions. COMMON BRAND NAME(S): Adriamycin, Adriamycin PFS, Adriamycin RDF, Rubex What should I tell my health care provider before I take this medicine? They need to know if you have any of these conditions: -heart disease -history of low blood counts caused by a medicine -liver disease -recent or ongoing radiation therapy -an unusual or allergic reaction to doxorubicin, other chemotherapy agents, other medicines, foods, dyes, or preservatives -pregnant or trying to get pregnant -breast-feeding How should I use this medicine? This drug is given as an infusion into a vein. It is administered in a hospital or clinic by a specially trained health care professional. If you have pain, swelling, burning or any unusual feeling around the site of your injection, tell your health care professional right away. Talk to  your pediatrician regarding the use of this medicine in children. Special care may be needed. Overdosage: If you think you have taken too much of this medicine contact a poison control center or emergency room at once. NOTE: This medicine is only for you. Do not share this medicine with others. What if I miss a dose? It is important not to miss your dose. Call your doctor or health care professional if you are unable to keep an appointment. What may interact with this medicine? This medicine may interact with the following medications: -6-mercaptopurine -paclitaxel -phenytoin -St. John's Wort -trastuzumab -verapamil This list may not describe all possible interactions. Give your health care provider a list of all the medicines, herbs, non-prescription drugs, or dietary supplements you use. Also tell them if you smoke, drink alcohol, or use illegal drugs. Some items may interact with your medicine. What should I watch  for while using this medicine? This drug may make you feel generally unwell. This is not uncommon, as chemotherapy can affect healthy cells as well as cancer cells. Report any side effects. Continue your course of treatment even though you feel ill unless your doctor tells you to stop. There is a maximum amount of this medicine you should receive throughout your life. The amount depends on the medical condition being treated and your overall health. Your doctor will watch how much of this medicine you receive in your lifetime. Tell your doctor if you have taken this medicine before. You may need blood work done while you are taking this medicine. Your urine may turn red for a few days after your dose. This is not blood. If your urine is dark or brown, call your doctor. In some cases, you may be given additional medicines to help with side effects. Follow all directions for their use. Call your doctor or health care professional for advice if you get a fever, chills or sore throat, or  other symptoms of a cold or flu. Do not treat yourself. This drug decreases your body's ability to fight infections. Try to avoid being around people who are sick. This medicine may increase your risk to bruise or bleed. Call your doctor or health care professional if you notice any unusual bleeding. Talk to your doctor about your risk of cancer. You may be more at risk for certain types of cancers if you take this medicine. Do not become pregnant while taking this medicine or for 6 months after stopping it. Women should inform their doctor if they wish to become pregnant or think they might be pregnant. Men should not father a child while taking this medicine and for 6 months after stopping it. There is a potential for serious side effects to an unborn child. Talk to your health care professional or pharmacist for more information. Do not breast-feed an infant while taking this medicine. This medicine has caused ovarian failure in some women and reduced sperm counts in some men This medicine may interfere with the ability to have a child. Talk with your doctor or health care professional if you are concerned about your fertility. What side effects may I notice from receiving this medicine? Side effects that you should report to your doctor or health care professional as soon as possible: -allergic reactions like skin rash, itching or hives, swelling of the face, lips, or tongue -breathing problems -chest pain -fast or irregular heartbeat -low blood counts - this medicine may decrease the number of white blood cells, red blood cells and platelets. You may be at increased risk for infections and bleeding. -pain, redness, or irritation at site where injected -signs of infection - fever or chills, cough, sore throat, pain or difficulty passing urine -signs of decreased platelets or bleeding - bruising, pinpoint red spots on the skin, black, tarry stools, blood in the urine -swelling of the ankles, feet,  hands -tiredness -weakness Side effects that usually do not require medical attention (report to your doctor or health care professional if they continue or are bothersome): -diarrhea -hair loss -mouth sores -nail discoloration or damage -nausea -red colored urine -vomiting This list may not describe all possible side effects. Call your doctor for medical advice about side effects. You may report side effects to FDA at 1-800-FDA-1088. Where should I keep my medicine? This drug is given in a hospital or clinic and will not be stored at home. NOTE: This sheet is  a summary. It may not cover all possible information. If you have questions about this medicine, talk to your doctor, pharmacist, or health care provider.  2018 Elsevier/Gold Standard (2015-02-28 11:28:51)

## 2017-12-10 NOTE — Progress Notes (Signed)
Okay to treat with ANC = 0.2 per Dr. Maylon Peppers.

## 2017-12-11 ENCOUNTER — Other Ambulatory Visit: Payer: Self-pay | Admitting: Hematology

## 2017-12-11 DIAGNOSIS — N390 Urinary tract infection, site not specified: Secondary | ICD-10-CM

## 2017-12-11 LAB — URINE CULTURE: Culture: NO GROWTH

## 2017-12-11 MED ORDER — CIPROFLOXACIN HCL 500 MG PO TABS: 500.0000 mg | ORAL_TABLET | Freq: Two times a day (BID) | ORAL | 0 refills | Status: AC

## 2017-12-13 ENCOUNTER — Telehealth: Payer: Self-pay | Admitting: *Deleted

## 2017-12-13 NOTE — Telephone Encounter (Signed)
I spoke with the patient's wife. It's addressed.  Thanks.  [place]

## 2017-12-13 NOTE — Telephone Encounter (Signed)
Received voice mail message from patient stating,"I would like to talk to Dr. Maylon Peppers concerning some test results. My return number is [phone]."

## 2017-12-23 ENCOUNTER — Inpatient Hospital Stay: Payer: Medicare Other

## 2017-12-23 ENCOUNTER — Inpatient Hospital Stay: Payer: Medicare Other | Attending: Hematology

## 2017-12-23 ENCOUNTER — Encounter: Payer: Self-pay | Admitting: Hematology

## 2017-12-23 ENCOUNTER — Other Ambulatory Visit: Payer: Self-pay

## 2017-12-23 ENCOUNTER — Encounter: Payer: Self-pay | Admitting: *Deleted

## 2017-12-23 ENCOUNTER — Inpatient Hospital Stay (HOSPITAL_BASED_OUTPATIENT_CLINIC_OR_DEPARTMENT_OTHER): Payer: Medicare Other | Admitting: Hematology

## 2017-12-23 ENCOUNTER — Telehealth: Payer: Self-pay | Admitting: Hematology

## 2017-12-23 VITALS — BP 146/81 | HR 79 | Temp 98.3°F | Resp 19

## 2017-12-23 DIAGNOSIS — R1030 Lower abdominal pain, unspecified: Secondary | ICD-10-CM | POA: Insufficient documentation

## 2017-12-23 DIAGNOSIS — R109 Unspecified abdominal pain: Secondary | ICD-10-CM

## 2017-12-23 DIAGNOSIS — C8190 Hodgkin lymphoma, unspecified, unspecified site: Secondary | ICD-10-CM

## 2017-12-23 DIAGNOSIS — Z79899 Other long term (current) drug therapy: Secondary | ICD-10-CM | POA: Insufficient documentation

## 2017-12-23 DIAGNOSIS — D701 Agranulocytosis secondary to cancer chemotherapy: Secondary | ICD-10-CM

## 2017-12-23 DIAGNOSIS — K5900 Constipation, unspecified: Secondary | ICD-10-CM | POA: Insufficient documentation

## 2017-12-23 DIAGNOSIS — G4700 Insomnia, unspecified: Secondary | ICD-10-CM | POA: Diagnosis not present

## 2017-12-23 DIAGNOSIS — E876 Hypokalemia: Secondary | ICD-10-CM | POA: Insufficient documentation

## 2017-12-23 DIAGNOSIS — I700 Atherosclerosis of aorta: Secondary | ICD-10-CM | POA: Insufficient documentation

## 2017-12-23 DIAGNOSIS — Z5111 Encounter for antineoplastic chemotherapy: Secondary | ICD-10-CM

## 2017-12-23 DIAGNOSIS — C8141 Lymphocyte-rich classical Hodgkin lymphoma, lymph nodes of head, face, and neck: Secondary | ICD-10-CM | POA: Diagnosis not present

## 2017-12-23 DIAGNOSIS — E860 Dehydration: Secondary | ICD-10-CM | POA: Insufficient documentation

## 2017-12-23 DIAGNOSIS — Z7982 Long term (current) use of aspirin: Secondary | ICD-10-CM

## 2017-12-23 DIAGNOSIS — R05 Cough: Secondary | ICD-10-CM | POA: Diagnosis not present

## 2017-12-23 DIAGNOSIS — T451X5A Adverse effect of antineoplastic and immunosuppressive drugs, initial encounter: Secondary | ICD-10-CM

## 2017-12-23 DIAGNOSIS — E46 Unspecified protein-calorie malnutrition: Secondary | ICD-10-CM | POA: Diagnosis not present

## 2017-12-23 LAB — CBC WITH DIFFERENTIAL (CANCER CENTER ONLY)
Abs Immature Granulocytes: 0.03 10*3/uL (ref 0.00–0.07)
Basophils Absolute: 0.1 10*3/uL (ref 0.0–0.1)
Basophils Relative: 2 %
EOS ABS: 0.1 10*3/uL (ref 0.0–0.5)
Eosinophils Relative: 4 %
HCT: 38.4 % — ABNORMAL LOW (ref 39.0–52.0)
Hemoglobin: 12.5 g/dL — ABNORMAL LOW (ref 13.0–17.0)
Immature Granulocytes: 1 %
LYMPHS ABS: 1.3 10*3/uL (ref 0.7–4.0)
Lymphocytes Relative: 54 %
MCH: 28.9 pg (ref 26.0–34.0)
MCHC: 32.6 g/dL (ref 30.0–36.0)
MCV: 88.7 fL (ref 80.0–100.0)
MONO ABS: 0.7 10*3/uL (ref 0.1–1.0)
Monocytes Relative: 30 %
NEUTROS PCT: 9 %
Neutro Abs: 0.2 10*3/uL — CL (ref 1.7–7.7)
Platelet Count: 304 10*3/uL (ref 150–400)
RBC: 4.33 MIL/uL (ref 4.22–5.81)
RDW: 12.5 % (ref 11.5–15.5)
WBC Count: 2.4 10*3/uL — ABNORMAL LOW (ref 4.0–10.5)
nRBC: 0 % (ref 0.0–0.2)

## 2017-12-23 LAB — CMP (CANCER CENTER ONLY)
ALBUMIN: 3.7 g/dL (ref 3.5–5.0)
ALK PHOS: 79 U/L (ref 38–126)
ALT: 23 U/L (ref 0–44)
AST: 16 U/L (ref 15–41)
Anion gap: 9 (ref 5–15)
BUN: 14 mg/dL (ref 8–23)
CO2: 25 mmol/L (ref 22–32)
Calcium: 8.7 mg/dL — ABNORMAL LOW (ref 8.9–10.3)
Chloride: 103 mmol/L (ref 98–111)
Creatinine: 0.96 mg/dL (ref 0.61–1.24)
GFR, Est AFR Am: >60 mL/min (ref >60)
GFR, Estimated: >60 mL/min (ref >60)
GLUCOSE: 122 mg/dL — AB (ref 70–99)
Potassium: 3.5 mmol/L (ref 3.5–5.1)
Sodium: 137 mmol/L (ref 135–145)
Total Bilirubin: 0.6 mg/dL (ref 0.3–1.2)
Total Protein: 6.4 g/dL — ABNORMAL LOW (ref 6.5–8.1)

## 2017-12-23 MED ORDER — HEPARIN SOD (PORK) LOCK FLUSH 100 UNIT/ML IV SOLN
500.0000 [IU] | Freq: Once | INTRAVENOUS | Status: AC
  Administered 2017-12-23: 500 [IU] via INTRAVENOUS
  Filled 2017-12-23: qty 5

## 2017-12-23 MED ORDER — TRAZODONE HCL 50 MG PO TABS: 50.0000 mg | ORAL_TABLET | Freq: Every evening | ORAL | 5 refills | Status: DC | PRN

## 2017-12-23 MED ORDER — SODIUM CHLORIDE 0.9% FLUSH
10.0000 mL | INTRAVENOUS | Status: DC | PRN
  Administered 2017-12-23: 10 mL via INTRAVENOUS
  Filled 2017-12-23: qty 10

## 2017-12-23 NOTE — Progress Notes (Signed)
[facility] OFFICE PROGRESS NOTE  Patient Care Team: Tysinger, Camelia Eng, PA-C as PCP - General (Family Medicine) Nahser, Wonda Cheng, MD as PCP - Cardiology (Cardiology)  HEME/ONC OVERVIEW: 1. Stage III classic Hodgkin lymphoma, subtype NOS due to limited specimen -Bx-proven R cervical LN involvement in 09/2017; PET in 11/2017 showed cervical, mediastinal and upper abdominal LN involement; treatment delayed due to pt traveling on a cruise after diagnosis -Treatment: 1st line ABVD, 11/26/2017 - present   2. Port placement in 11/2017   ASSESSMENT & PLAN:   Stage III classic Hodgkin lymphoma, favorable risk  -Patient tolerated C1 relatively well except intermittent GI cramping  -Clinically, the cervical LN's have normalized in size, which is very assuring  -Labs appropriate to proceed with C2D1 on 12/24/2017; neutropenia not an indication to delay treatment  -We will plan to repeat PET scan after 2 cycles to assess disease response (currently scheduled on 01/10/2018); if Deauville score 1-3, then we can de-escalate to AVD x 4 cycles  -PRN anti-emetics: Zofran, Compazine, dexamethasone and Ativan  Chemotherapy-associated neutropenia  -No definitive symptom to suggest infection  -WBC 2.4k, ANC pending -Neutropenia not an indication to delay treatment for ABVD; G-CSF not indicated unless severe infection is suspected  -The patient was counseled on any concerning symptoms, such as fever (temperature > 100.4), for which he should contact the clinic promptly  Abdominal cramping -Likely secondary to chemotherapy -Patient denies any significant abdominal pain, vomiting, or diarrhea  -He has not tried any PRN medications, such as Zofran or Compazine; I encouraged the patient to take them as needed to see if they help with the cramping   Insomnia -I recommend a trial of OTC melatonin; if no response, he can try PRN trazodone   No orders of the defined types were placed in this  encounter.  All questions were answered. The patient knows to call the clinic with any problems, questions or concerns. No barriers to learning was detected.  A total of more than 25 minutes were spent face-to-face with the patient during this encounter and over half of that time was spent on counseling and coordination of care as outlined above.   Return on 01/06/2018 for labs and clinic follow-up with NP Laverna Peace, and 01/07/2018 for C2D15 of ABVD. PET scheduled on 01/10/2018.  [name], MD 12/23/2017 10:38 AM  CHIEF COMPLAINT: "I am doing okay"  INTERVAL HISTORY: Bradley Novak returns to clinic for labs and follow-up prior to C2D1 of ABVD.  He reports that after the last cycle of chemotherapy, he had intermittent lower abdominal cramping and fatigue, which lasted longer than after his first treatment of chemotherapy, but the severity of the symptoms were not as intense.  He has not taken any of his PRN medications for the abdominal cramping.  He has some difficulty staying asleep at night, wakes up around 2 to 3 AM and usually falls back to sleep around 4 AM.  He has not taken any sleep aid.  He denies any fever, chill, night sweats, weight change, chest pain, dyspnea, abdominal pain, nausea, vomiting, or diarrhea.  SUMMARY OF ONCOLOGIC HISTORY:   Hodgkin lymphoma ([place])   08/29/2017 Initial Diagnosis    Hodgkin lymphoma ([place])    10/07/2017 Imaging    CT neck w/ contrast: Enlarged nodes in the right jugular chain, posterior triangle, and supraclavicular fossa. The pattern is most concerning for lymphoma; multiple nodes are superficial and amenable to biopsy.  CT CAP w/ contrast: 1. Right supraclavicular and  subcarinal adenopathy is identified. Differential considerations include lymphoproliferative disorder, metastatic adenopathy, granulomatous inflammation/infection or reactive adenopathy. Consider further evaluation with tissue sampling. 2. No acute findings, mass or adenopathy within  the abdomen. 3. Lungs are clear.  No evidence for pneumonia.  4.  Aortic Atherosclerosis (ICD10-I70.0).    10/25/2017 Procedure    US-guided R cervical LN biopsy    10/25/2017 Pathology Results    (Accession: [id number]) Lymph node, needle/core biopsy, Right Cervical - CLASSIC HODGKIN LYMPHOMA - SEE COMMENT - CD30 (1%) - Unable to subtype due to limited sample specimen    11/12/2017 Cancer Staging    Staging form: Hodgkin and Non-Hodgkin Lymphoma, AJCC 8th Edition - Clinical stage from 11/12/2017: Stage II (Hodgkin lymphoma, A - Asymptomatic) - Signed by [name], MD on 11/12/2017    11/21/2017 -  Chemotherapy    The patient had DOXOrubicin (ADRIAMYCIN) chemo injection 60 mg, 25 mg/m2 = 60 mg, Intravenous,  Once, 0 of 4 cycles palonosetron (ALOXI) injection 0.25 mg, 0.25 mg, Intravenous,  Once, 0 of 4 cycles bleomycin (BLEOCIN) 24 Units in sodium chloride 0.9 % 50 mL chemo infusion, 10 Units/m2 = 24 Units, Intravenous,  Once, 0 of 4 cycles dacarbazine (DTIC) 910 mg in sodium chloride 0.9 % 250 mL chemo infusion, 375 mg/m2 = 910 mg, Intravenous,  Once, 0 of 4 cycles vinBLAStine (VELBAN) 14.6 mg in sodium chloride 0.9 % 50 mL chemo infusion, 6 mg/m2 = 14.6 mg, Intravenous, Once, 0 of 4 cycles fosaprepitant (EMEND) 150 mg, dexamethasone (DECADRON) 12 mg in sodium chloride 0.9 % 145 mL IVPB, , Intravenous,  Once, 0 of 4 cycles  for chemotherapy treatment.     11/21/2017 Imaging    PET: IMPRESSION: 1. Multiple relatively small hypermetabolic lymph nodes in the RIGHT neck, mediastinum and limited involvement of the upper abdomen. Lymph node activity is high (greater than liver, Deauville 4). 2. The most intense enlarged lymph node is anterior to the sternocleidomastoid muscle on the RIGHT. There are multiple assessable lymph nodes in the RIGHT cervical chain. 3. Minimal hypermetabolic adenopathy in the upper abdomen. No pelvic or inguinal metastatic adenopathy. 4. Normal spleen and bone  marrow. 5. Single focus of metabolic activity in the sigmoid colon. Recommend screening colonoscopy if patient is not current for such.     REVIEW OF SYSTEMS:   Constitutional: ( - ) fevers, ( - )  chills , ( - ) night sweats Eyes: ( - ) blurriness of vision, ( - ) double vision, ( - ) watery eyes Ears, nose, mouth, throat, and face: ( - ) mucositis, ( - ) sore throat Respiratory: ( - ) cough, ( - ) dyspnea, ( - ) wheezes Cardiovascular: ( - ) palpitation, ( - ) chest discomfort, ( - ) lower extremity swelling Gastrointestinal:  ( - ) nausea, ( - ) heartburn, ( - ) change in bowel habits Skin: ( - ) abnormal skin rashes Lymphatics: ( - ) easy bruising Neurological: ( - ) numbness, ( - ) tingling, ( - ) new weaknesses Behavioral/Psych: ( - ) mood change, ( - ) new changes  All other systems were reviewed with the patient and are negative.  I have reviewed the past medical history, past surgical history, social history and family history with the patient and they are unchanged from previous note.  ALLERGIES:  has No Known Allergies.  MEDICATIONS:  Current Outpatient Medications  Medication Sig Dispense Refill  . aspirin EC 81 MG tablet Take 1 tablet (81 mg total)  by mouth daily. 90 tablet 3  . atorvastatin (LIPITOR) 20 MG tablet TAKE 1 TABLET BY MOUTH DAILY 90 tablet 2  . carvedilol (COREG) 6.25 MG tablet Take 1 tablet (6.25 mg total) by mouth 2 (two) times daily. 180 tablet 3  . Cholecalciferol (VITAMIN D PO) Take 1 capsule by mouth at bedtime.    Marland Kitchen dexamethasone (DECADRON) 4 MG tablet Take 2 tablets by mouth once a day on the day after chemotherapy and then take 2 tablets two times a day for 2 days. Take with food. 30 tablet 1  . diclofenac (VOLTAREN) 75 MG EC tablet Take 75 mg by mouth daily.    Marland Kitchen ibuprofen (ADVIL,MOTRIN) 200 MG tablet Take 400 mg by mouth daily as needed for moderate pain.    Marland Kitchen lidocaine-prilocaine (EMLA) cream Apply to affected area once 30 g 3  . LORazepam  (ATIVAN) 0.5 MG tablet Take 1 tablet (0.5 mg total) by mouth every 6 (six) hours as needed (Nausea or vomiting). 30 tablet 0  . losartan (COZAAR) 100 MG tablet Take 1 tablet (100 mg total) by mouth daily. 90 tablet 3  . ondansetron (ZOFRAN) 8 MG tablet Take 1 tablet (8 mg total) by mouth 2 (two) times daily as needed. Start on the third day after chemotherapy. 30 tablet 1  . prochlorperazine (COMPAZINE) 10 MG tablet Take 1 tablet (10 mg total) by mouth every 6 (six) hours as needed (Nausea or vomiting). 30 tablet 1  . ranitidine (ZANTAC) 150 MG tablet Take 150 mg by mouth daily as needed for heartburn.     No current facility-administered medications for this visit.     PHYSICAL EXAMINATION: ECOG PERFORMANCE STATUS: 0 - Asymptomatic  Today's Vitals   12/23/17 1013  BP: (!) 146/81  Pulse: 79  Resp: 19  Temp: 98.3 F (36.8 C)  TempSrc: Oral  SpO2: 100%  PainSc: 0-No pain   There is no height or weight on file to calculate BMI.  There were no vitals filed for this visit.   Wt Readings from Last 3 Encounters:  11/20/17 259 lb (117.5 kg)  11/12/17 259 lb 6.4 oz (117.7 kg)  11/11/17 259 lb (117.5 kg)   Temp Readings from Last 3 Encounters:  12/23/17 98.3 F (36.8 C) (Oral)  11/26/17 98.8 F (37.1 C) (Oral)   BP Readings from Last 3 Encounters:  12/23/17 (!) 146/81  12/09/17 (!) 152/82  11/26/17 (!) 147/75   Pulse Readings from Last 3 Encounters:  12/23/17 79  12/09/17 64  11/26/17 72   GENERAL: alert, no distress and comfortable SKIN: skin color, texture, turgor are normal, no rashes or significant lesions EYES: conjunctiva are pink and non-injected, sclera clear OROPHARYNX: no exudate, no erythema; lips, buccal mucosa, and tongue normal  NECK: supple, non-tender LYMPH:  previous cervical lymphadenopathy resolved, no axillary lymphadenopathy  LUNGS: clear to auscultation and percussion with normal breathing effort HEART: regular rate & rhythm and no murmurs and no  lower extremity edema ABDOMEN: soft, non-tender, non-distended, normal bowel sounds Musculoskeletal: no cyanosis of digits and no clubbing  PSYCH: alert & oriented x 3, fluent speech NEURO: no focal motor/sensory deficits  LABORATORY DATA:  I have reviewed the data as listed    Component Value Date/Time   NA 136 12/09/2017 1055   NA 141 08/29/2017 1155   K 3.7 12/09/2017 1055   CL 107 12/09/2017 1055   CO2 28 12/09/2017 1055   GLUCOSE 105 12/09/2017 1055   BUN 14 12/09/2017 1055  BUN 16 08/29/2017 1155   CREATININE 0.90 12/09/2017 1055   CREATININE 0.83 09/27/2014 0001   CALCIUM 9.2 12/09/2017 1055   PROT 6.9 12/09/2017 1055   PROT 7.4 08/29/2017 1155   ALBUMIN 3.5 12/09/2017 1055   ALBUMIN 4.1 08/29/2017 1155   AST 21 12/09/2017 1055   ALT 26 12/09/2017 1055   ALKPHOS 72 12/09/2017 1055   BILITOT 0.6 12/09/2017 1055   GFRNONAA >60 11/12/2017 0931   GFRAA >60 11/12/2017 0931    No results found for: SPEP, UPEP  Lab Results  Component Value Date   WBC 2.4 (L) 12/23/2017   NEUTROABS PENDING 12/23/2017   HGB 12.5 (L) 12/23/2017   HCT 38.4 (L) 12/23/2017   MCV 88.7 12/23/2017   PLT 304 12/23/2017      Chemistry      Component Value Date/Time   NA 136 12/09/2017 1055   NA 141 08/29/2017 1155   K 3.7 12/09/2017 1055   CL 107 12/09/2017 1055   CO2 28 12/09/2017 1055   BUN 14 12/09/2017 1055   BUN 16 08/29/2017 1155   CREATININE 0.90 12/09/2017 1055   CREATININE 0.83 09/27/2014 0001      Component Value Date/Time   CALCIUM 9.2 12/09/2017 1055   ALKPHOS 72 12/09/2017 1055   AST 21 12/09/2017 1055   ALT 26 12/09/2017 1055   BILITOT 0.6 12/09/2017 1055

## 2017-12-23 NOTE — Telephone Encounter (Signed)
No change in appts  Per 12/9 los

## 2017-12-23 NOTE — Progress Notes (Signed)
Rosann Auerbach from lab notified this nurse of an abnormal ANC. MD notified. Per Dr. Maylon Peppers, it's OK to treat tomorrow with the [facility].

## 2017-12-24 ENCOUNTER — Other Ambulatory Visit: Payer: Medicare Other

## 2017-12-24 ENCOUNTER — Inpatient Hospital Stay: Payer: Medicare Other

## 2017-12-24 VITALS — BP 141/78 | HR 70 | Temp 98.0°F | Resp 20 | Ht 71.0 in | Wt 259.0 lb

## 2017-12-24 DIAGNOSIS — Z5111 Encounter for antineoplastic chemotherapy: Secondary | ICD-10-CM | POA: Diagnosis not present

## 2017-12-24 DIAGNOSIS — C8141 Lymphocyte-rich classical Hodgkin lymphoma, lymph nodes of head, face, and neck: Secondary | ICD-10-CM | POA: Diagnosis not present

## 2017-12-24 DIAGNOSIS — C8190 Hodgkin lymphoma, unspecified, unspecified site: Secondary | ICD-10-CM

## 2017-12-24 DIAGNOSIS — R1030 Lower abdominal pain, unspecified: Secondary | ICD-10-CM | POA: Diagnosis not present

## 2017-12-24 DIAGNOSIS — D701 Agranulocytosis secondary to cancer chemotherapy: Secondary | ICD-10-CM | POA: Diagnosis not present

## 2017-12-24 DIAGNOSIS — E860 Dehydration: Secondary | ICD-10-CM | POA: Diagnosis not present

## 2017-12-24 DIAGNOSIS — E46 Unspecified protein-calorie malnutrition: Secondary | ICD-10-CM | POA: Diagnosis not present

## 2017-12-24 MED ORDER — PALONOSETRON HCL INJECTION 0.25 MG/5ML
0.2500 mg | Freq: Once | INTRAVENOUS | Status: AC
  Administered 2017-12-24: 0.25 mg via INTRAVENOUS

## 2017-12-24 MED ORDER — SODIUM CHLORIDE 0.9 % IV SOLN
Freq: Once | INTRAVENOUS | Status: AC
  Administered 2017-12-24: 10:00:00 via INTRAVENOUS
  Filled 2017-12-24: qty 250

## 2017-12-24 MED ORDER — SODIUM CHLORIDE 0.9 % IV SOLN
370.0000 mg/m2 | Freq: Once | INTRAVENOUS | Status: AC
  Administered 2017-12-24: 900 mg via INTRAVENOUS
  Filled 2017-12-24: qty 90

## 2017-12-24 MED ORDER — SODIUM CHLORIDE 0.9% FLUSH
10.0000 mL | INTRAVENOUS | Status: DC | PRN
  Administered 2017-12-24: 10 mL
  Filled 2017-12-24: qty 10

## 2017-12-24 MED ORDER — VINBLASTINE SULFATE CHEMO INJECTION 1 MG/ML
15.0000 mg | Freq: Once | INTRAVENOUS | Status: AC
  Administered 2017-12-24: 15 mg via INTRAVENOUS
  Filled 2017-12-24: qty 15

## 2017-12-24 MED ORDER — SODIUM CHLORIDE 0.9 % IV SOLN
Freq: Once | INTRAVENOUS | Status: AC
  Administered 2017-12-24: 11:00:00 via INTRAVENOUS
  Filled 2017-12-24: qty 5

## 2017-12-24 MED ORDER — SODIUM CHLORIDE 0.9 % IV SOLN
10.0000 [IU]/m2 | Freq: Once | INTRAVENOUS | Status: AC
  Administered 2017-12-24: 24 [IU] via INTRAVENOUS
  Filled 2017-12-24: qty 8

## 2017-12-24 MED ORDER — DOXORUBICIN HCL CHEMO IV INJECTION 2 MG/ML
25.0000 mg/m2 | Freq: Once | INTRAVENOUS | Status: AC
  Administered 2017-12-24: 60 mg via INTRAVENOUS
  Filled 2017-12-24: qty 30

## 2017-12-24 MED ORDER — HEPARIN SOD (PORK) LOCK FLUSH 100 UNIT/ML IV SOLN
500.0000 [IU] | Freq: Once | INTRAVENOUS | Status: AC | PRN
  Administered 2017-12-24: 500 [IU]
  Filled 2017-12-24: qty 5

## 2017-12-24 MED ORDER — PALONOSETRON HCL INJECTION 0.25 MG/5ML
INTRAVENOUS | Status: AC
  Filled 2017-12-24: qty 5

## 2017-12-24 NOTE — Patient Instructions (Signed)
[facility] Discharge Instructions for Patients Receiving Chemotherapy  Today you received the following chemotherapy agents Adriamycin, Bleomycin, DTIC, Velban  To help prevent nausea and vomiting after your treatment, we encourage you to take your nausea medication    If you develop nausea and vomiting that is not controlled by your nausea medication, call the clinic.   BELOW ARE SYMPTOMS THAT SHOULD BE REPORTED IMMEDIATELY:  *FEVER GREATER THAN 100.5 F  *CHILLS WITH OR WITHOUT FEVER  NAUSEA AND VOMITING THAT IS NOT CONTROLLED WITH YOUR NAUSEA MEDICATION  *UNUSUAL SHORTNESS OF BREATH  *UNUSUAL BRUISING OR BLEEDING  TENDERNESS IN MOUTH AND THROAT WITH OR WITHOUT PRESENCE OF ULCERS  *URINARY PROBLEMS  *BOWEL PROBLEMS  UNUSUAL RASH Items with * indicate a potential emergency and should be followed up as soon as possible.  Feel free to call the clinic should you have any questions or concerns. The clinic phone number is [phone].  Please show the [facility] at check-in to the Emergency Department and triage nurse.

## 2018-01-06 ENCOUNTER — Inpatient Hospital Stay (HOSPITAL_BASED_OUTPATIENT_CLINIC_OR_DEPARTMENT_OTHER): Payer: Medicare Other | Admitting: Family

## 2018-01-06 ENCOUNTER — Inpatient Hospital Stay: Payer: Medicare Other

## 2018-01-06 ENCOUNTER — Other Ambulatory Visit: Payer: Self-pay

## 2018-01-06 VITALS — BP 147/85 | HR 78 | Temp 98.0°F | Resp 18 | Wt 253.0 lb

## 2018-01-06 DIAGNOSIS — C8190 Hodgkin lymphoma, unspecified, unspecified site: Secondary | ICD-10-CM

## 2018-01-06 DIAGNOSIS — Z7982 Long term (current) use of aspirin: Secondary | ICD-10-CM | POA: Diagnosis not present

## 2018-01-06 DIAGNOSIS — Z79899 Other long term (current) drug therapy: Secondary | ICD-10-CM

## 2018-01-06 DIAGNOSIS — E860 Dehydration: Secondary | ICD-10-CM | POA: Diagnosis not present

## 2018-01-06 DIAGNOSIS — R1030 Lower abdominal pain, unspecified: Secondary | ICD-10-CM

## 2018-01-06 DIAGNOSIS — T451X5A Adverse effect of antineoplastic and immunosuppressive drugs, initial encounter: Secondary | ICD-10-CM

## 2018-01-06 DIAGNOSIS — C8141 Lymphocyte-rich classical Hodgkin lymphoma, lymph nodes of head, face, and neck: Secondary | ICD-10-CM

## 2018-01-06 DIAGNOSIS — Z5111 Encounter for antineoplastic chemotherapy: Secondary | ICD-10-CM | POA: Diagnosis not present

## 2018-01-06 DIAGNOSIS — G4700 Insomnia, unspecified: Secondary | ICD-10-CM

## 2018-01-06 DIAGNOSIS — I700 Atherosclerosis of aorta: Secondary | ICD-10-CM

## 2018-01-06 DIAGNOSIS — E46 Unspecified protein-calorie malnutrition: Secondary | ICD-10-CM | POA: Diagnosis not present

## 2018-01-06 DIAGNOSIS — D701 Agranulocytosis secondary to cancer chemotherapy: Secondary | ICD-10-CM

## 2018-01-06 DIAGNOSIS — Z95828 Presence of other vascular implants and grafts: Secondary | ICD-10-CM

## 2018-01-06 LAB — CBC WITH DIFFERENTIAL (CANCER CENTER ONLY)
Abs Immature Granulocytes: 0.2 10*3/uL — ABNORMAL HIGH (ref 0.00–0.07)
Basophils Absolute: 0.1 10*3/uL (ref 0.0–0.1)
Basophils Relative: 1 %
Eosinophils Absolute: 0.1 10*3/uL (ref 0.0–0.5)
Eosinophils Relative: 1 %
HCT: 36.3 % — ABNORMAL LOW (ref 39.0–52.0)
Hemoglobin: 11.9 g/dL — ABNORMAL LOW (ref 13.0–17.0)
Immature Granulocytes: 6 %
Lymphocytes Relative: 32 %
Lymphs Abs: 1.1 10*3/uL (ref 0.7–4.0)
MCH: 28.4 pg (ref 26.0–34.0)
MCHC: 32.8 g/dL (ref 30.0–36.0)
MCV: 86.6 fL (ref 80.0–100.0)
MONO ABS: 1.2 10*3/uL — AB (ref 0.1–1.0)
Monocytes Relative: 35 %
Neutro Abs: 0.9 10*3/uL — ABNORMAL LOW (ref 1.7–7.7)
Neutrophils Relative %: 25 %
Platelet Count: 303 10*3/uL (ref 150–400)
RBC: 4.19 MIL/uL — ABNORMAL LOW (ref 4.22–5.81)
RDW: 12.3 % (ref 11.5–15.5)
WBC Count: 3.5 10*3/uL — ABNORMAL LOW (ref 4.0–10.5)
nRBC: 0 % (ref 0.0–0.2)

## 2018-01-06 LAB — CMP (CANCER CENTER ONLY)
ALT: 57 U/L — ABNORMAL HIGH (ref 0–44)
AST: 32 U/L (ref 15–41)
Albumin: 3.5 g/dL (ref 3.5–5.0)
Alkaline Phosphatase: 102 U/L (ref 38–126)
Anion gap: 9 (ref 5–15)
BUN: 12 mg/dL (ref 8–23)
CO2: 25 mmol/L (ref 22–32)
Calcium: 8.6 mg/dL — ABNORMAL LOW (ref 8.9–10.3)
Chloride: 101 mmol/L (ref 98–111)
Creatinine: 0.95 mg/dL (ref 0.61–1.24)
GFR, Estimated: >60 mL/min (ref >60)
Glucose, Bld: 125 mg/dL — ABNORMAL HIGH (ref 70–99)
Potassium: 3.4 mmol/L — ABNORMAL LOW (ref 3.5–5.1)
Sodium: 135 mmol/L (ref 135–145)
Total Bilirubin: 0.7 mg/dL (ref 0.3–1.2)
Total Protein: 6 g/dL — ABNORMAL LOW (ref 6.5–8.1)

## 2018-01-06 MED ORDER — HEPARIN SOD (PORK) LOCK FLUSH 100 UNIT/ML IV SOLN
500.0000 [IU] | Freq: Once | INTRAVENOUS | Status: AC
  Administered 2018-01-06: 500 [IU] via INTRAVENOUS
  Filled 2018-01-06: qty 5

## 2018-01-06 MED ORDER — SODIUM CHLORIDE 0.9% FLUSH
10.0000 mL | INTRAVENOUS | Status: DC | PRN
  Administered 2018-01-06: 10 mL via INTRAVENOUS
  Filled 2018-01-06: qty 10

## 2018-01-06 NOTE — Patient Instructions (Signed)

## 2018-01-06 NOTE — Progress Notes (Signed)
Hematology and Oncology Follow Up Visit  Julie Paolini [id number] 23-Jan-1950 67 y.o. 01/06/2018   Principle Diagnosis:  Stage III classic Hodgkin lymphoma, favorable risk  Current Therapy:   ABVD s/p cycle 1  Interim History:  Mr. Pamer is here today with his wife for follow-up. He states that he feels "fuzzy" for a day after after treatment and will have palpitations for 30 minutes after. These symptoms resolve on their own without any intervention.  He is still not sleeping well despite taking the Desyrel and feels fatigued.  He had some abdominal cramping for several day. This resolved with starting a daily probiotic. He will continue this.  His appetite has been down due to everything having a bland taste. His weight is down 6 lbs since his last visit.  He is doing his best to stay well hydrated. He is drinking flavored water to avoid the metallic taste.  He is stressed over the cost of his last PET scan and has been billed $5,000. He is worried about having the follow-up scan again later this week and getting a second bill for the same amount.  No lymphadenopathy noted on exam.  No episodes of bleeding, no bruising or petechiae.  No fever, chills, n/v, cough, rash, dizziness, SOB, chest pain, abdominal pain or changes in bowel or bladder habits.  No swelling, tenderness, numbness or tingling in his extremities at this time.   ECOG Performance Status: 1 - Symptomatic but completely ambulatory  Medications:  Allergies as of 01/06/2018   No Known Allergies     Medication List       Accurate as of January 06, 2018 10:34 AM. Always use your most recent med list.        aspirin EC 81 MG tablet Take 1 tablet (81 mg total) by mouth daily.   atorvastatin 20 MG tablet Commonly known as:  LIPITOR TAKE 1 TABLET BY MOUTH DAILY   carvedilol 6.25 MG tablet Commonly known as:  COREG Take 1 tablet (6.25 mg total) by mouth 2 (two) times daily.   dexamethasone 4 MG  tablet Commonly known as:  DECADRON Take 2 tablets by mouth once a day on the day after chemotherapy and then take 2 tablets two times a day for 2 days. Take with food.   diclofenac 75 MG EC tablet Commonly known as:  VOLTAREN Take 75 mg by mouth daily.   ibuprofen 200 MG tablet Commonly known as:  ADVIL,MOTRIN Take 400 mg by mouth daily as needed for moderate pain.   lidocaine-prilocaine cream Commonly known as:  EMLA Apply to affected area once   LORazepam 0.5 MG tablet Commonly known as:  ATIVAN Take 1 tablet (0.5 mg total) by mouth every 6 (six) hours as needed (Nausea or vomiting).   losartan 100 MG tablet Commonly known as:  COZAAR Take 1 tablet (100 mg total) by mouth daily.   ondansetron 8 MG tablet Commonly known as:  ZOFRAN Take 1 tablet (8 mg total) by mouth 2 (two) times daily as needed. Start on the third day after chemotherapy.   prochlorperazine 10 MG tablet Commonly known as:  COMPAZINE Take 1 tablet (10 mg total) by mouth every 6 (six) hours as needed (Nausea or vomiting).   ranitidine 150 MG tablet Commonly known as:  ZANTAC Take 150 mg by mouth daily as needed for heartburn.   traZODone 50 MG tablet Commonly known as:  DESYREL Take 1 tablet (50 mg total) by mouth at bedtime as needed for  sleep.   VITAMIN D PO Take 1 capsule by mouth at bedtime.       Allergies: No Known Allergies  Past Medical History, Surgical history, Social history, and Family History were reviewed and updated.  Review of Systems: All other 10 point review of systems is negative.   Physical Exam:  vitals were not taken for this visit.   Wt Readings from Last 3 Encounters:  12/24/17 259 lb (117.5 kg)  11/20/17 259 lb (117.5 kg)  11/12/17 259 lb 6.4 oz (117.7 kg)    Ocular: Sclerae unicteric, pupils equal, round and reactive to light Ear-nose-throat: Oropharynx clear, dentition fair Lymphatic: No cervical, supraclavicular or axillary adenopathy Lungs no rales or  rhonchi, good excursion bilaterally Heart regular rate and rhythm, no murmur appreciated Abd soft, nontender, positive bowel sounds, no liver or spleen tip palpated on exam, no fluid wave  MSK no focal spinal tenderness, no joint edema Neuro: non-focal, well-oriented, appropriate affect Breasts: Deferred  Lab Results  Component Value Date   WBC 2.4 (L) 12/23/2017   HGB 12.5 (L) 12/23/2017   HCT 38.4 (L) 12/23/2017   MCV 88.7 12/23/2017   PLT 304 12/23/2017   No results found for: FERRITIN, IRON, TIBC, UIBC, IRONPCTSAT Lab Results  Component Value Date   RBC 4.33 12/23/2017   No results found for: KPAFRELGTCHN, LAMBDASER, KAPLAMBRATIO No results found for: IGGSERUM, IGA, IGMSERUM No results found for: Ronnald Ramp, A1GS, A2GS, Tillman Sers, SPEI   Chemistry      Component Value Date/Time   NA 137 12/23/2017 1005   NA 141 08/29/2017 1155   K 3.5 12/23/2017 1005   CL 103 12/23/2017 1005   CO2 25 12/23/2017 1005   BUN 14 12/23/2017 1005   BUN 16 08/29/2017 1155   CREATININE 0.96 12/23/2017 1005   CREATININE 0.83 09/27/2014 0001      Component Value Date/Time   CALCIUM 8.7 (L) 12/23/2017 1005   ALKPHOS 79 12/23/2017 1005   AST 16 12/23/2017 1005   ALT 23 12/23/2017 1005   BILITOT 0.6 12/23/2017 1005       Impression and Plan: Mr. Torti is a very pleasant 67 yo caucasian gentleman with stage III classic Hodgkin lymphoma. He is currently in cycle 2 and has had some issues with fatigue, not sleeping well and abdominal cramping.  His WBC count is improved at 3.5 and Hgb of 11. 9 is stable.  The abdominal cramping has resolved with taking a probiotic.  We will discontinue the Desyrel and he will try taking his Ativan tonight at bedtime. He will let us know tomorrow if this works to help him rest.  He will return tomorrow for treatment.  We will work on getting some answers regarding the PET scan cost. I have contacted Darlena in imaging to discuss  how to help him and determine if his PET scan needs to be moved to another facility that accept his insurance. I left a message and am currently awaiting a call back.  He will contact our office with any questins or concerns. We can certainly see him sooner if need be.   Laverna Peace, NP 12/23/201910:34 AM

## 2018-01-07 ENCOUNTER — Ambulatory Visit: Payer: Medicare Other

## 2018-01-07 ENCOUNTER — Other Ambulatory Visit: Payer: Self-pay

## 2018-01-07 ENCOUNTER — Inpatient Hospital Stay: Payer: Medicare Other

## 2018-01-07 DIAGNOSIS — E46 Unspecified protein-calorie malnutrition: Secondary | ICD-10-CM | POA: Diagnosis not present

## 2018-01-07 DIAGNOSIS — E860 Dehydration: Secondary | ICD-10-CM | POA: Diagnosis not present

## 2018-01-07 DIAGNOSIS — C8141 Lymphocyte-rich classical Hodgkin lymphoma, lymph nodes of head, face, and neck: Secondary | ICD-10-CM | POA: Diagnosis not present

## 2018-01-07 DIAGNOSIS — C8190 Hodgkin lymphoma, unspecified, unspecified site: Secondary | ICD-10-CM

## 2018-01-07 DIAGNOSIS — Z5111 Encounter for antineoplastic chemotherapy: Secondary | ICD-10-CM | POA: Diagnosis not present

## 2018-01-07 DIAGNOSIS — D701 Agranulocytosis secondary to cancer chemotherapy: Secondary | ICD-10-CM | POA: Diagnosis not present

## 2018-01-07 DIAGNOSIS — R1030 Lower abdominal pain, unspecified: Secondary | ICD-10-CM | POA: Diagnosis not present

## 2018-01-07 MED ORDER — SODIUM CHLORIDE 0.9 % IV SOLN
370.0000 mg/m2 | Freq: Once | INTRAVENOUS | Status: AC
  Administered 2018-01-07: 900 mg via INTRAVENOUS
  Filled 2018-01-07: qty 90

## 2018-01-07 MED ORDER — HEPARIN SOD (PORK) LOCK FLUSH 100 UNIT/ML IV SOLN
500.0000 [IU] | Freq: Once | INTRAVENOUS | Status: AC | PRN
  Administered 2018-01-07: 500 [IU]
  Filled 2018-01-07: qty 5

## 2018-01-07 MED ORDER — SODIUM CHLORIDE 0.9% FLUSH
10.0000 mL | INTRAVENOUS | Status: DC | PRN
  Administered 2018-01-07: 10 mL
  Filled 2018-01-07: qty 10

## 2018-01-07 MED ORDER — SODIUM CHLORIDE 0.9 % IV SOLN
Freq: Once | INTRAVENOUS | Status: AC
  Administered 2018-01-07: 09:00:00 via INTRAVENOUS
  Filled 2018-01-07: qty 5

## 2018-01-07 MED ORDER — SODIUM CHLORIDE 0.9 % IV SOLN
10.0000 [IU]/m2 | Freq: Once | INTRAVENOUS | Status: AC
  Administered 2018-01-07: 24 [IU] via INTRAVENOUS
  Filled 2018-01-07: qty 8

## 2018-01-07 MED ORDER — PALONOSETRON HCL INJECTION 0.25 MG/5ML
0.2500 mg | Freq: Once | INTRAVENOUS | Status: AC
  Administered 2018-01-07: 0.25 mg via INTRAVENOUS

## 2018-01-07 MED ORDER — VINBLASTINE SULFATE CHEMO INJECTION 1 MG/ML
6.1700 mg/m2 | Freq: Once | INTRAVENOUS | Status: AC
  Administered 2018-01-07: 15 mg via INTRAVENOUS
  Filled 2018-01-07: qty 15

## 2018-01-07 MED ORDER — SODIUM CHLORIDE 0.9 % IV SOLN
Freq: Once | INTRAVENOUS | Status: AC
  Administered 2018-01-07: 09:00:00 via INTRAVENOUS
  Filled 2018-01-07: qty 250

## 2018-01-07 MED ORDER — DOXORUBICIN HCL CHEMO IV INJECTION 2 MG/ML
25.0000 mg/m2 | Freq: Once | INTRAVENOUS | Status: AC
  Administered 2018-01-07: 60 mg via INTRAVENOUS
  Filled 2018-01-07: qty 30

## 2018-01-07 MED ORDER — PALONOSETRON HCL INJECTION 0.25 MG/5ML
INTRAVENOUS | Status: AC
  Filled 2018-01-07: qty 5

## 2018-01-07 NOTE — Patient Instructions (Signed)
[facility] Discharge Instructions for Patients Receiving Chemotherapy  Today you received the following chemotherapy agents Adriamycin, Bleomycin, DTIC, Velban  To help prevent nausea and vomiting after your treatment, we encourage you to take your nausea medication    If you develop nausea and vomiting that is not controlled by your nausea medication, call the clinic.   BELOW ARE SYMPTOMS THAT SHOULD BE REPORTED IMMEDIATELY:  *FEVER GREATER THAN 100.5 F  *CHILLS WITH OR WITHOUT FEVER  NAUSEA AND VOMITING THAT IS NOT CONTROLLED WITH YOUR NAUSEA MEDICATION  *UNUSUAL SHORTNESS OF BREATH  *UNUSUAL BRUISING OR BLEEDING  TENDERNESS IN MOUTH AND THROAT WITH OR WITHOUT PRESENCE OF ULCERS  *URINARY PROBLEMS  *BOWEL PROBLEMS  UNUSUAL RASH Items with * indicate a potential emergency and should be followed up as soon as possible.  Feel free to call the clinic should you have any questions or concerns. The clinic phone number is [phone].  Please show the [place] at check-in to the Emergency Department and triage nurse.

## 2018-01-07 NOTE — Progress Notes (Signed)
Ok to treat per Dr. Ennever 

## 2018-01-09 ENCOUNTER — Telehealth: Payer: Self-pay | Admitting: Hematology

## 2018-01-09 NOTE — Telephone Encounter (Signed)
I called and spoke with patient regarding his concern for his last PET scan and billing.  Per my telephone conversation with Gaspar Bidding , Registration Mgr she suggested that he contact hospital billing office.  I gave patient their number # [phone] and explained that he woul need to contact them to discuss any billing questions. He was in agreement and will call them.

## 2018-01-10 ENCOUNTER — Ambulatory Visit (HOSPITAL_COMMUNITY)
Admission: RE | Admit: 2018-01-10 | Discharge: 2018-01-10 | Disposition: A | Discharge status: Home / Self Care | Payer: Medicare Other | Source: Ambulatory Visit | Attending: Hematology | Admitting: Hematology

## 2018-01-10 DIAGNOSIS — C8190 Hodgkin lymphoma, unspecified, unspecified site: Secondary | ICD-10-CM

## 2018-01-10 DIAGNOSIS — C8590 Non-Hodgkin lymphoma, unspecified, unspecified site: Secondary | ICD-10-CM | POA: Diagnosis not present

## 2018-01-10 LAB — GLUCOSE, CAPILLARY: Glucose-Capillary: 124 mg/dL — ABNORMAL HIGH (ref 70–99)

## 2018-01-10 MED ORDER — FLUDEOXYGLUCOSE F - 18 (FDG) INJECTION
12.5200 | Freq: Once | INTRAVENOUS | Status: AC | PRN
  Administered 2018-01-10: 12.52 via INTRAVENOUS

## 2018-01-13 ENCOUNTER — Encounter: Payer: Self-pay | Admitting: Hematology

## 2018-01-14 ENCOUNTER — Inpatient Hospital Stay: Payer: Medicare Other

## 2018-01-14 ENCOUNTER — Encounter: Payer: Self-pay | Admitting: Hematology

## 2018-01-14 ENCOUNTER — Inpatient Hospital Stay (HOSPITAL_BASED_OUTPATIENT_CLINIC_OR_DEPARTMENT_OTHER): Payer: Medicare Other | Admitting: Hematology

## 2018-01-14 ENCOUNTER — Telehealth: Payer: Self-pay | Admitting: Hematology

## 2018-01-14 VITALS — BP 105/88 | HR 96 | Temp 98.2°F | Resp 18 | Ht 71.0 in | Wt 246.9 lb

## 2018-01-14 DIAGNOSIS — I700 Atherosclerosis of aorta: Secondary | ICD-10-CM

## 2018-01-14 DIAGNOSIS — Z7982 Long term (current) use of aspirin: Secondary | ICD-10-CM | POA: Diagnosis not present

## 2018-01-14 DIAGNOSIS — E860 Dehydration: Secondary | ICD-10-CM

## 2018-01-14 DIAGNOSIS — T451X5A Adverse effect of antineoplastic and immunosuppressive drugs, initial encounter: Secondary | ICD-10-CM

## 2018-01-14 DIAGNOSIS — D701 Agranulocytosis secondary to cancer chemotherapy: Secondary | ICD-10-CM | POA: Diagnosis not present

## 2018-01-14 DIAGNOSIS — E876 Hypokalemia: Secondary | ICD-10-CM | POA: Diagnosis not present

## 2018-01-14 DIAGNOSIS — Z95828 Presence of other vascular implants and grafts: Secondary | ICD-10-CM

## 2018-01-14 DIAGNOSIS — R05 Cough: Secondary | ICD-10-CM

## 2018-01-14 DIAGNOSIS — R109 Unspecified abdominal pain: Secondary | ICD-10-CM

## 2018-01-14 DIAGNOSIS — Z5111 Encounter for antineoplastic chemotherapy: Secondary | ICD-10-CM

## 2018-01-14 DIAGNOSIS — Z79899 Other long term (current) drug therapy: Secondary | ICD-10-CM

## 2018-01-14 DIAGNOSIS — E46 Unspecified protein-calorie malnutrition: Secondary | ICD-10-CM | POA: Insufficient documentation

## 2018-01-14 DIAGNOSIS — C8190 Hodgkin lymphoma, unspecified, unspecified site: Secondary | ICD-10-CM

## 2018-01-14 DIAGNOSIS — C8141 Lymphocyte-rich classical Hodgkin lymphoma, lymph nodes of head, face, and neck: Secondary | ICD-10-CM | POA: Diagnosis not present

## 2018-01-14 DIAGNOSIS — R1030 Lower abdominal pain, unspecified: Secondary | ICD-10-CM

## 2018-01-14 DIAGNOSIS — G4700 Insomnia, unspecified: Secondary | ICD-10-CM

## 2018-01-14 DIAGNOSIS — R059 Cough, unspecified: Secondary | ICD-10-CM

## 2018-01-14 DIAGNOSIS — K5900 Constipation, unspecified: Secondary | ICD-10-CM

## 2018-01-14 LAB — CBC WITH DIFFERENTIAL (CANCER CENTER ONLY)
Abs Immature Granulocytes: 0.02 10*3/uL (ref 0.00–0.07)
Basophils Absolute: 0 10*3/uL (ref 0.0–0.1)
Basophils Relative: 1 %
Eosinophils Absolute: 0 10*3/uL (ref 0.0–0.5)
Eosinophils Relative: 2 %
HCT: 33.7 % — ABNORMAL LOW (ref 39.0–52.0)
Hemoglobin: 11.4 g/dL — ABNORMAL LOW (ref 13.0–17.0)
Immature Granulocytes: 1 %
Lymphocytes Relative: 45 %
Lymphs Abs: 0.8 10*3/uL (ref 0.7–4.0)
MCH: 28.1 pg (ref 26.0–34.0)
MCHC: 33.8 g/dL (ref 30.0–36.0)
MCV: 83 fL (ref 80.0–100.0)
Monocytes Absolute: 0.1 10*3/uL (ref 0.1–1.0)
Monocytes Relative: 6 %
NRBC: 0 % (ref 0.0–0.2)
Neutro Abs: 0.8 10*3/uL — ABNORMAL LOW (ref 1.7–7.7)
Neutrophils Relative %: 45 %
Platelet Count: 230 10*3/uL (ref 150–400)
RBC: 4.06 MIL/uL — ABNORMAL LOW (ref 4.22–5.81)
RDW: 12 % (ref 11.5–15.5)
WBC Count: 1.7 10*3/uL — ABNORMAL LOW (ref 4.0–10.5)

## 2018-01-14 LAB — CMP (CANCER CENTER ONLY)
ALT: 28 U/L (ref 0–44)
AST: 15 U/L (ref 15–41)
Albumin: 3 g/dL — ABNORMAL LOW (ref 3.5–5.0)
Alkaline Phosphatase: 88 U/L (ref 38–126)
Anion gap: 9 (ref 5–15)
BUN: 15 mg/dL (ref 8–23)
CO2: 28 mmol/L (ref 22–32)
Calcium: 8.9 mg/dL (ref 8.9–10.3)
Chloride: 99 mmol/L (ref 98–111)
Creatinine: 0.83 mg/dL (ref 0.61–1.24)
GFR, Est AFR Am: >60 mL/min (ref >60)
GFR, Estimated: >60 mL/min (ref >60)
Glucose, Bld: 127 mg/dL — ABNORMAL HIGH (ref 70–99)
Potassium: 3.2 mmol/L — ABNORMAL LOW (ref 3.5–5.1)
Sodium: 136 mmol/L (ref 135–145)
TOTAL PROTEIN: 6.5 g/dL (ref 6.5–8.1)
Total Bilirubin: 0.9 mg/dL (ref 0.3–1.2)

## 2018-01-14 MED ORDER — MORPHINE SULFATE (CONCENTRATE) 10 MG /0.5 ML PO SOLN: 10.0000 mg | ORAL | 0 refills | Status: AC | PRN

## 2018-01-14 MED ORDER — HEPARIN SOD (PORK) LOCK FLUSH 100 UNIT/ML IV SOLN
500.0000 [IU] | Freq: Once | INTRAVENOUS | Status: AC
  Administered 2018-01-14: 500 [IU] via INTRAVENOUS
  Filled 2018-01-14: qty 5

## 2018-01-14 MED ORDER — SODIUM CHLORIDE 0.9% FLUSH
10.0000 mL | INTRAVENOUS | Status: DC | PRN
  Administered 2018-01-14: 10 mL via INTRAVENOUS
  Filled 2018-01-14: qty 10

## 2018-01-14 MED ORDER — SODIUM CHLORIDE 0.9 % IV SOLN
Freq: Once | INTRAVENOUS | Status: AC
  Administered 2018-01-14: 16:00:00 via INTRAVENOUS
  Filled 2018-01-14: qty 250

## 2018-01-14 MED ORDER — POTASSIUM CHLORIDE 20 MEQ/15ML (10%) PO SOLN: 20.0000 meq | Freq: Every day | ORAL | 2 refills | Status: DC

## 2018-01-14 MED ORDER — POLYETHYLENE GLYCOL 3350 17 G PO PACK: 17.0000 g | PACK | Freq: Two times a day (BID) | ORAL | 5 refills | Status: AC

## 2018-01-14 MED ORDER — LACTULOSE 10 GM/15ML PO SOLN: 10.0000 g | Freq: Two times a day (BID) | ORAL | 1 refills | Status: AC | PRN

## 2018-01-14 MED ORDER — METOCLOPRAMIDE HCL 10 MG PO TABS: 10.0000 mg | ORAL_TABLET | Freq: Three times a day (TID) | ORAL | 0 refills | Status: DC

## 2018-01-14 MED ORDER — HEPARIN SOD (PORK) LOCK FLUSH 100 UNIT/ML IV SOLN
250.0000 [IU] | Freq: Once | INTRAVENOUS | Status: AC | PRN
  Administered 2018-01-14: 250 [IU]
  Filled 2018-01-14: qty 5

## 2018-01-14 MED ORDER — SENNOSIDES-DOCUSATE SODIUM 8.6-50 MG PO TABS: 1.0000 | ORAL_TABLET | Freq: Two times a day (BID) | ORAL | 5 refills | Status: DC

## 2018-01-14 MED ORDER — SODIUM CHLORIDE 0.9% FLUSH
10.0000 mL | Freq: Once | INTRAVENOUS | Status: AC | PRN
  Administered 2018-01-14: 10 mL
  Filled 2018-01-14: qty 10

## 2018-01-14 NOTE — Telephone Encounter (Signed)
Per 12/31 los No change in appts.

## 2018-01-14 NOTE — Progress Notes (Signed)
[facility] OFFICE PROGRESS NOTE  Patient Care Team: Tysinger, Camelia Eng, PA-C as PCP - General (Family Medicine) Nahser, Wonda Cheng, MD as PCP - Cardiology (Cardiology)  HEME/ONC OVERVIEW: 1. Stage III classic Hodgkin lymphoma, subtype NOS due to limited specimen -Bx-proven R cervical LN involvement in 09/2017; PET in 11/2017 showed cervical, mediastinal and upper abdominal LN involement; treatment delayed due to pt traveling on a cruise after diagnosis -11/2017 - present: ABVD -12/2017: PET after 2 cycles of ABVD showed interval CR   2. Port placement in 11/2017   CHEMOTHERAPY REGIMEN:  11/26/2017 - present: ABVD x 2 cycles, CR; plan for AVD x 4 cycles  ASSESSMENT & PLAN:   Stage III classic Hodgkin lymphoma, favorable risk  -I independently reviewed the radiologic images of recent PET, and agree the findings as documented -In summary, PET showed interval complete response after 2 cycles of ABVD -Given the excellent response, we will de-escalate chemotherapy to AVD for 4 more cycles  -PRN anti-emetics: Zofran, Compazine, dexamethasone and Ativan  Malnutrition, dehydration -Patient has been able to eat or drink very little over the past a few weeks due to severe abdominal cramping -On exam, patient appears very dehydrated  -We will administer 1L NS in clinic today, and plan for daily IV fluid with 1L NS M-F for the next 2 weeks   -I will also refer the patient to nutrition for dietary recommendations   Abdominal cramping -Likely secondary to chemotherapy side effects and constipation -I have prescribed Sennokot, Miralax, and PRN lactulose -For decreased GI motility, I have also prescribed a short trial of Reglan to see if that would help with early satiety and cramping  Abdominal pain -I reviewed the CT images of recent PET/CT with radiology; while the quality of CT images is somewhat limited, there was no acute intra-abdominal pathology identified -I have  prescribed IR morphine liquid -Patient understands that opioid medication can exacerbate constipation, and therefore he must be compliant with the bowel regimen  Cough -I reviewed the recent PET/CT images, and there was no evidence of pulmonary fibrosis, consolidation or infiltrates -As it is exacerbated by lying flat at night, it may be due to post-nasal drip vs. acid reflux -I encouraged the patient to take Zantac on a scheduled basis and see if that helps with cough; if no improvement and he continues to have reflux symptoms, we can switch to PPI   Chemotherapy-associated neutropenia  -Patient denies any symptoms of infection  -WBC 1.7k w/ ANC 800 -Neutropenia not an indication to delay treatment for ABVD; G-CSF not indicated unless severe infection is suspected  -The patient was counseled on any concerning symptoms, such as fever (temperature > 100.4), for which he should contact the clinic promptly  Hypokalemia -K 3.2 today -I have prescribed KCl solution 77mEq daily   Orders Placed This Encounter  Procedures  . Magnesium    Standing Status:   Standing    Number of Occurrences:   20    Standing Expiration Date:   01/15/2019   All questions were answered. The patient knows to call the clinic with any problems, questions or concerns. No barriers to learning was detected.  A total of more than 40 minutes were spent face-to-face with the patient during this encounter and over half of that time was spent on counseling and coordination of care as outlined above.   Return on 1/6/202 for labs, port flush, IV fluid and clinic follow-up.  [name], MD 01/14/2018 3:39 PM  CHIEF COMPLAINT: "I am doing okay"  INTERVAL HISTORY: Bradley Novak was seen today as an add-on clinic appointment due to dehydration, severe abdominal cramping/pain, and general malaise.  Patient reports that since the last treatment, he has had worsening persistent abdominal crampin that did not improve with PRN  antiemetics.  He has been taking Tylenol for abdominal pain due to persistent abdominal cramping, but has not relieved the pain.  He also has constipation, for which he has tried occasional MiraLAX with no improvement.  He reports a slight dry cough since last Saturday, but denies any chest pain, dyspnea, sputum production, or hemoptysis.  He has been eating and drinking very little due to decreased appetite and abdominal cramping.  He denies any fever, chill, night sweats, or lymphadenopathy.  SUMMARY OF ONCOLOGIC HISTORY:   Hodgkin lymphoma ([name])   08/29/2017 Initial Diagnosis    Hodgkin lymphoma ([name])    10/07/2017 Imaging    CT neck w/ contrast: Enlarged nodes in the right jugular chain, posterior triangle, and supraclavicular fossa. The pattern is most concerning for lymphoma; multiple nodes are superficial and amenable to biopsy.  CT CAP w/ contrast: 1. Right supraclavicular and subcarinal adenopathy is identified. Differential considerations include lymphoproliferative disorder, metastatic adenopathy, granulomatous inflammation/infection or reactive adenopathy. Consider further evaluation with tissue sampling. 2. No acute findings, mass or adenopathy within the abdomen. 3. Lungs are clear.  No evidence for pneumonia.  4.  Aortic Atherosclerosis (ICD10-I70.0).    10/25/2017 Procedure    US-guided R cervical LN biopsy    10/25/2017 Pathology Results    (Accession: [id number]) Lymph node, needle/core biopsy, Right Cervical - CLASSIC HODGKIN LYMPHOMA - SEE COMMENT - CD30 (1%) - Unable to subtype due to limited sample specimen    11/12/2017 Cancer Staging    Staging form: Hodgkin and Non-Hodgkin Lymphoma, AJCC 8th Edition - Clinical stage from 11/12/2017: Stage II (Hodgkin lymphoma, A - Asymptomatic) - Signed by [name], MD on 11/12/2017    11/21/2017 -  Chemotherapy    The patient had DOXOrubicin (ADRIAMYCIN) chemo injection 60 mg, 25 mg/m2 = 60 mg, Intravenous,  Once, 0 of 4  cycles palonosetron (ALOXI) injection 0.25 mg, 0.25 mg, Intravenous,  Once, 0 of 4 cycles bleomycin (BLEOCIN) 24 Units in sodium chloride 0.9 % 50 mL chemo infusion, 10 Units/m2 = 24 Units, Intravenous,  Once, 0 of 4 cycles dacarbazine (DTIC) 910 mg in sodium chloride 0.9 % 250 mL chemo infusion, 375 mg/m2 = 910 mg, Intravenous,  Once, 0 of 4 cycles vinBLAStine (VELBAN) 14.6 mg in sodium chloride 0.9 % 50 mL chemo infusion, 6 mg/m2 = 14.6 mg, Intravenous, Once, 0 of 4 cycles fosaprepitant (EMEND) 150 mg, dexamethasone (DECADRON) 12 mg in sodium chloride 0.9 % 145 mL IVPB, , Intravenous,  Once, 0 of 4 cycles  for chemotherapy treatment.     11/21/2017 Imaging    PET: IMPRESSION: 1. Multiple relatively small hypermetabolic lymph nodes in the RIGHT neck, mediastinum and limited involvement of the upper abdomen. Lymph node activity is high (greater than liver, [place] 4). 2. The most intense enlarged lymph node is anterior to the sternocleidomastoid muscle on the RIGHT. There are multiple assessable lymph nodes in the RIGHT cervical chain. 3. Minimal hypermetabolic adenopathy in the upper abdomen. No pelvic or inguinal metastatic adenopathy. 4. Normal spleen and bone marrow. 5. Single focus of metabolic activity in the sigmoid colon. Recommend screening colonoscopy if patient is not current for such.     REVIEW OF SYSTEMS:   Constitutional: ( - )  fevers, ( - )  chills , ( - ) night sweats Eyes: ( - ) blurriness of vision, ( - ) double vision, ( - ) watery eyes Ears, nose, mouth, throat, and face: ( - ) mucositis, ( - ) sore throat Respiratory: ( + ) cough, ( - ) dyspnea, ( - ) wheezes Cardiovascular: ( - ) palpitation, ( - ) chest discomfort, ( - ) lower extremity swelling Gastrointestinal:  ( + ) nausea, ( + ) heartburn, ( + ) change in bowel habits Skin: ( - ) abnormal skin rashes Lymphatics: ( - ) easy bruising Neurological: ( - ) numbness, ( - ) tingling, ( + ) generalized  weaknesses Behavioral/Psych: ( - ) mood change, ( - ) new changes  All other systems were reviewed with the patient and are negative.  I have reviewed the past medical history, past surgical history, social history and family history with the patient and they are unchanged from previous note.  ALLERGIES:  has No Known Allergies.  MEDICATIONS:  Current Outpatient Medications  Medication Sig Dispense Refill  . aspirin EC 81 MG tablet Take 1 tablet (81 mg total) by mouth daily. 90 tablet 3  . atorvastatin (LIPITOR) 20 MG tablet TAKE 1 TABLET BY MOUTH DAILY 90 tablet 2  . carvedilol (COREG) 6.25 MG tablet Take 1 tablet (6.25 mg total) by mouth 2 (two) times daily. 180 tablet 3  . Cholecalciferol (VITAMIN D PO) Take 1 capsule by mouth at bedtime.    Marland Kitchen dexamethasone (DECADRON) 4 MG tablet Take 2 tablets by mouth once a day on the day after chemotherapy and then take 2 tablets two times a day for 2 days. Take with food. 30 tablet 1  . diclofenac (VOLTAREN) 75 MG EC tablet Take 75 mg by mouth daily.    Marland Kitchen ibuprofen (ADVIL,MOTRIN) 200 MG tablet Take 400 mg by mouth daily as needed for moderate pain.    Marland Kitchen LORazepam (ATIVAN) 0.5 MG tablet Take 1 tablet (0.5 mg total) by mouth every 6 (six) hours as needed (Nausea or vomiting). 30 tablet 0  . losartan (COZAAR) 100 MG tablet Take 1 tablet (100 mg total) by mouth daily. 90 tablet 3  . ondansetron (ZOFRAN) 8 MG tablet Take 1 tablet (8 mg total) by mouth 2 (two) times daily as needed. Start on the third day after chemotherapy. 30 tablet 1  . prochlorperazine (COMPAZINE) 10 MG tablet Take 1 tablet (10 mg total) by mouth every 6 (six) hours as needed (Nausea or vomiting). 30 tablet 1  . ranitidine (ZANTAC) 150 MG tablet Take 150 mg by mouth daily as needed for heartburn.    . lidocaine-prilocaine (EMLA) cream Apply to affected area once (Patient not taking: Reported on 01/14/2018) 30 g 3  . metoCLOPramide (REGLAN) 10 MG tablet Take 1 tablet (10 mg total) by  mouth 3 (three) times daily before meals for 14 days. 42 tablet 0  . Morphine Sulfate (MORPHINE CONCENTRATE) 10 mg / 0.5 ml concentrated solution Take 0.5 mLs (10 mg total) by mouth every 4 (four) hours as needed for up to 14 days for severe pain. 60 mL 0  . polyethylene glycol (MIRALAX) packet Take 17 g by mouth 2 (two) times daily. 14 each 5  . potassium chloride 20 MEQ/15ML (10%) SOLN Take 15 mLs (20 mEq total) by mouth daily. 450 mL 2  . senna-docusate (SENOKOT-S) 8.6-50 MG tablet Take 1 tablet by mouth 2 (two) times daily. 60 tablet 5   No current  facility-administered medications for this visit.     PHYSICAL EXAMINATION: ECOG PERFORMANCE STATUS: 2 - Symptomatic, <50% confined to bed  Today's Vitals   01/14/18 1453 01/14/18 1456  BP: 105/88   Pulse: 96   Resp: 18   Temp: 98.2 F (36.8 C)   TempSrc: Oral   SpO2: 99%   Weight: 246 lb 14.4 oz (112 kg)   Height: 5\' 11"  (1.803 m)   PainSc:  5    Body mass index is 34.44 kg/m.  Filed Weights   01/14/18 1453  Weight: 246 lb 14.4 oz (112 kg)     Wt Readings from Last 3 Encounters:  01/14/18 246 lb 14.4 oz (112 kg)  01/06/18 253 lb (114.8 kg)  12/24/17 259 lb (117.5 kg)   Temp Readings from Last 3 Encounters:  01/14/18 98.2 F (36.8 C) (Oral)  01/06/18 98 F (36.7 C) (Oral)  12/24/17 98 F (36.7 C) (Oral)   BP Readings from Last 3 Encounters:  01/14/18 105/88  01/06/18 (!) 147/85  12/24/17 (!) 141/78   Pulse Readings from Last 3 Encounters:  01/14/18 96  01/06/18 78  12/24/17 70   GENERAL: alert, no distress, very fatigued appearing SKIN: skin color, texture, turgor are normal, no rashes or significant lesions EYES: conjunctiva are pink and non-injected, sclera clear OROPHARYNX: no exudate, no erythema; lips, buccal mucosa, and tongue normal  NECK: supple, non-tender LYMPH:  previous cervical lymphadenopathy resolved, no axillary lymphadenopathy  LUNGS: clear to auscultation and percussion with normal  breathing effort HEART: regular rate & rhythm and no murmurs and no lower extremity edema ABDOMEN: soft, non-tender, non-distended, normal bowel sounds Musculoskeletal: no cyanosis of digits and no clubbing  PSYCH: alert & oriented x 3, fluent speech NEURO: no focal motor/sensory deficits  LABORATORY DATA:  I have reviewed the data as listed    Component Value Date/Time   NA 136 01/14/2018 1421   NA 141 08/29/2017 1155   K 3.2 (L) 01/14/2018 1421   CL 99 01/14/2018 1421   CO2 28 01/14/2018 1421   GLUCOSE 127 (H) 01/14/2018 1421   BUN 15 01/14/2018 1421   BUN 16 08/29/2017 1155   CREATININE 0.83 01/14/2018 1421   CREATININE 0.83 09/27/2014 0001   CALCIUM 8.9 01/14/2018 1421   PROT 6.5 01/14/2018 1421   PROT 7.4 08/29/2017 1155   ALBUMIN 3.0 (L) 01/14/2018 1421   ALBUMIN 4.1 08/29/2017 1155   AST 15 01/14/2018 1421   ALT 28 01/14/2018 1421   ALKPHOS 88 01/14/2018 1421   BILITOT 0.9 01/14/2018 1421   GFRNONAA >60 01/14/2018 1421   GFRAA >60 01/14/2018 1421    No results found for: SPEP, UPEP  Lab Results  Component Value Date   WBC 1.7 (L) 01/14/2018   NEUTROABS 0.8 (L) 01/14/2018   HGB 11.4 (L) 01/14/2018   HCT 33.7 (L) 01/14/2018   MCV 83.0 01/14/2018   PLT 230 01/14/2018      Chemistry      Component Value Date/Time   NA 136 01/14/2018 1421   NA 141 08/29/2017 1155   K 3.2 (L) 01/14/2018 1421   CL 99 01/14/2018 1421   CO2 28 01/14/2018 1421   BUN 15 01/14/2018 1421   BUN 16 08/29/2017 1155   CREATININE 0.83 01/14/2018 1421   CREATININE 0.83 09/27/2014 0001      Component Value Date/Time   CALCIUM 8.9 01/14/2018 1421   ALKPHOS 88 01/14/2018 1421   AST 15 01/14/2018 1421   ALT 28 01/14/2018 1421  BILITOT 0.9 01/14/2018 1421

## 2018-01-14 NOTE — Patient Instructions (Signed)
Dehydration, Adult  Dehydration is a condition in which there is not enough fluid or water in the body. This happens when you lose more fluids than you take in. Important organs, such as the kidneys, brain, and heart, cannot function without a proper amount of fluids. Any loss of fluids from the body can lead to dehydration. Dehydration can range from mild to severe. This condition should be treated right away to prevent it from becoming severe. What are the causes? This condition may be caused by:  Vomiting.  Diarrhea.  Excessive sweating, such as from heat exposure or exercise.  Not drinking enough fluid, especially: ? When ill. ? While doing activity that requires a lot of energy.  Excessive urination.  Fever.  Infection.  Certain medicines, such as medicines that cause the body to lose excess fluid (diuretics).  Inability to access safe drinking water.  Reduced physical ability to get adequate water and food. What increases the risk? This condition is more likely to develop in people:  Who have a poorly controlled long-term (chronic) illness, such as diabetes, heart disease, or kidney disease.  Who are age 65 or older.  Who are disabled.  Who live in a place with high altitude.  Who play endurance sports. What are the signs or symptoms? Symptoms of mild dehydration may include:  Thirst.  Dry lips.  Slightly dry mouth.  Dry, warm skin.  Dizziness. Symptoms of moderate dehydration may include:  Very dry mouth.  Muscle cramps.  Dark urine. Urine may be the color of tea.  Decreased urine production.  Decreased tear production.  Heartbeat that is irregular or faster than normal (palpitations).  Headache.  Light-headedness, especially when you stand up from a sitting position.  Fainting (syncope). Symptoms of severe dehydration may include:  Changes in skin, such as: ? Cold and clammy skin. ? Blotchy (mottled) or pale skin. ? Skin that does  not quickly return to normal after being lightly pinched and released (poor skin turgor).  Changes in body fluids, such as: ? Extreme thirst. ? No tear production. ? Inability to sweat when body temperature is high, such as in hot weather. ? Very little urine production.  Changes in vital signs, such as: ? Weak pulse. ? Pulse that is more than 100 beats a minute when sitting still. ? Rapid breathing. ? Low blood pressure.  Other changes, such as: ? Sunken eyes. ? Cold hands and feet. ? Confusion. ? Lack of energy (lethargy). ? Difficulty waking up from sleep. ? Short-term weight loss. ? Unconsciousness. How is this diagnosed? This condition is diagnosed based on your symptoms and a physical exam. Blood and urine tests may be done to help confirm the diagnosis. How is this treated? Treatment for this condition depends on the severity. Mild or moderate dehydration can often be treated at home. Treatment should be started right away. Do not wait until dehydration becomes severe. Severe dehydration is an emergency and it needs to be treated in a hospital. Treatment for mild dehydration may include:  Drinking more fluids.  Replacing salts and minerals in your blood (electrolytes) that you may have lost. Treatment for moderate dehydration may include:  Drinking an oral rehydration solution (ORS). This is a drink that helps you replace fluids and electrolytes (rehydrate). It can be found at pharmacies and retail stores. Treatment for severe dehydration may include:  Receiving fluids through an IV tube.  Receiving an electrolyte solution through a feeding tube that is passed through your nose and   into your stomach (nasogastric tube, or NG tube).  Correcting any abnormalities in electrolytes.  Treating the underlying cause of dehydration. Follow these instructions at home:  If directed by your health care provider, drink an ORS: ? Make an ORS by following instructions on the  package. ? Start by drinking small amounts, about  cup (120 mL) every 5-10 minutes. ? Slowly increase how much you drink until you have taken the amount recommended by your health care provider.  Drink enough clear fluid to keep your urine clear or pale yellow. If you were told to drink an ORS, finish the ORS first, then start slowly drinking other clear fluids. Drink fluids such as: ? Water. Do not drink only water. Doing that can lead to having too little salt (sodium) in the body (hyponatremia). ? Ice chips. ? Fruit juice that you have added water to (diluted fruit juice). ? Low-calorie sports drinks.  Avoid: ? Alcohol. ? Drinks that contain a lot of sugar. These include high-calorie sports drinks, fruit juice that is not diluted, and soda. ? Caffeine. ? Foods that are greasy or contain a lot of fat or sugar.  Take over-the-counter and prescription medicines only as told by your health care provider.  Do not take sodium tablets. This can lead to having too much sodium in the body (hypernatremia).  Eat foods that contain a healthy balance of electrolytes, such as bananas, oranges, potatoes, tomatoes, and spinach.  Keep all follow-up visits as told by your health care provider. This is important. Contact a health care provider if:  You have abdominal pain that: ? Gets worse. ? Stays in one area (localizes).  You have a rash.  You have a stiff neck.  You are more irritable than usual.  You are sleepier or more difficult to wake up than usual.  You feel weak or dizzy.  You feel very thirsty.  You have urinated only a small amount of very dark urine over 6-8 hours. Get help right away if:  You have symptoms of severe dehydration.  You cannot drink fluids without vomiting.  Your symptoms get worse with treatment.  You have a fever.  You have a severe headache.  You have vomiting or diarrhea that: ? Gets worse. ? Does not go away.  You have blood or green matter  (bile) in your vomit.  You have blood in your stool. This may cause stool to look black and tarry.  You have not urinated in 6-8 hours.  You faint.  Your heart rate while sitting still is over 100 beats a minute.  You have trouble breathing. This information is not intended to replace advice given to you by your health care provider. Make sure you discuss any questions you have with your health care provider. Document Released: 01/01/2005 Document Revised: 07/29/2015 Document Reviewed: 02/25/2015 Elsevier Interactive Patient Education  2019 [employer]  

## 2018-01-16 ENCOUNTER — Inpatient Hospital Stay: Payer: Medicare Other | Attending: Hematology

## 2018-01-16 ENCOUNTER — Other Ambulatory Visit: Payer: Self-pay

## 2018-01-16 VITALS — BP 110/71 | HR 79 | Temp 98.1°F | Resp 18

## 2018-01-16 DIAGNOSIS — R05 Cough: Secondary | ICD-10-CM | POA: Diagnosis not present

## 2018-01-16 DIAGNOSIS — E876 Hypokalemia: Secondary | ICD-10-CM | POA: Diagnosis not present

## 2018-01-16 DIAGNOSIS — K5900 Constipation, unspecified: Secondary | ICD-10-CM | POA: Insufficient documentation

## 2018-01-16 DIAGNOSIS — I700 Atherosclerosis of aorta: Secondary | ICD-10-CM | POA: Diagnosis not present

## 2018-01-16 DIAGNOSIS — C8198 Hodgkin lymphoma, unspecified, lymph nodes of multiple sites: Secondary | ICD-10-CM | POA: Insufficient documentation

## 2018-01-16 DIAGNOSIS — I471 Supraventricular tachycardia: Secondary | ICD-10-CM | POA: Insufficient documentation

## 2018-01-16 DIAGNOSIS — D6481 Anemia due to antineoplastic chemotherapy: Secondary | ICD-10-CM | POA: Diagnosis not present

## 2018-01-16 DIAGNOSIS — R109 Unspecified abdominal pain: Secondary | ICD-10-CM | POA: Insufficient documentation

## 2018-01-16 DIAGNOSIS — D709 Neutropenia, unspecified: Secondary | ICD-10-CM | POA: Insufficient documentation

## 2018-01-16 DIAGNOSIS — I2510 Atherosclerotic heart disease of native coronary artery without angina pectoris: Secondary | ICD-10-CM | POA: Insufficient documentation

## 2018-01-16 DIAGNOSIS — C8190 Hodgkin lymphoma, unspecified, unspecified site: Secondary | ICD-10-CM

## 2018-01-16 DIAGNOSIS — E46 Unspecified protein-calorie malnutrition: Secondary | ICD-10-CM | POA: Insufficient documentation

## 2018-01-16 DIAGNOSIS — E860 Dehydration: Secondary | ICD-10-CM | POA: Insufficient documentation

## 2018-01-16 DIAGNOSIS — Z5111 Encounter for antineoplastic chemotherapy: Secondary | ICD-10-CM | POA: Insufficient documentation

## 2018-01-16 DIAGNOSIS — Z79899 Other long term (current) drug therapy: Secondary | ICD-10-CM | POA: Diagnosis not present

## 2018-01-16 MED ORDER — SODIUM CHLORIDE 0.9 % IV SOLN
Freq: Once | INTRAVENOUS | Status: AC
  Administered 2018-01-16: 11:00:00 via INTRAVENOUS
  Filled 2018-01-16: qty 250

## 2018-01-16 MED ORDER — OXYCODONE-ACETAMINOPHEN 5-325 MG PO TABS
1.0000 | ORAL_TABLET | Freq: Once | ORAL | Status: AC
  Administered 2018-01-16: 1 via ORAL

## 2018-01-16 MED ORDER — SODIUM CHLORIDE 0.9% FLUSH
10.0000 mL | Freq: Once | INTRAVENOUS | Status: AC | PRN
  Administered 2018-01-16: 10 mL
  Filled 2018-01-16: qty 10

## 2018-01-16 MED ORDER — HEPARIN SOD (PORK) LOCK FLUSH 100 UNIT/ML IV SOLN
500.0000 [IU] | Freq: Once | INTRAVENOUS | Status: AC | PRN
  Administered 2018-01-16: 500 [IU]
  Filled 2018-01-16: qty 5

## 2018-01-16 MED ORDER — OXYCODONE-ACETAMINOPHEN 5-325 MG PO TABS
ORAL_TABLET | ORAL | Status: AC
  Filled 2018-01-16: qty 1

## 2018-01-16 NOTE — Patient Instructions (Signed)
Dehydration, Adult  Dehydration is a condition in which there is not enough fluid or water in the body. This happens when you lose more fluids than you take in. Important organs, such as the kidneys, brain, and heart, cannot function without a proper amount of fluids. Any loss of fluids from the body can lead to dehydration. Dehydration can range from mild to severe. This condition should be treated right away to prevent it from becoming severe. What are the causes? This condition may be caused by:  Vomiting.  Diarrhea.  Excessive sweating, such as from heat exposure or exercise.  Not drinking enough fluid, especially: ? When ill. ? While doing activity that requires a lot of energy.  Excessive urination.  Fever.  Infection.  Certain medicines, such as medicines that cause the body to lose excess fluid (diuretics).  Inability to access safe drinking water.  Reduced physical ability to get adequate water and food. What increases the risk? This condition is more likely to develop in people:  Who have a poorly controlled long-term (chronic) illness, such as diabetes, heart disease, or kidney disease.  Who are age 65 or older.  Who are disabled.  Who live in a place with high altitude.  Who play endurance sports. What are the signs or symptoms? Symptoms of mild dehydration may include:  Thirst.  Dry lips.  Slightly dry mouth.  Dry, warm skin.  Dizziness. Symptoms of moderate dehydration may include:  Very dry mouth.  Muscle cramps.  Dark urine. Urine may be the color of tea.  Decreased urine production.  Decreased tear production.  Heartbeat that is irregular or faster than normal (palpitations).  Headache.  Light-headedness, especially when you stand up from a sitting position.  Fainting (syncope). Symptoms of severe dehydration may include:  Changes in skin, such as: ? Cold and clammy skin. ? Blotchy (mottled) or pale skin. ? Skin that does  not quickly return to normal after being lightly pinched and released (poor skin turgor).  Changes in body fluids, such as: ? Extreme thirst. ? No tear production. ? Inability to sweat when body temperature is high, such as in hot weather. ? Very little urine production.  Changes in vital signs, such as: ? Weak pulse. ? Pulse that is more than 100 beats a minute when sitting still. ? Rapid breathing. ? Low blood pressure.  Other changes, such as: ? Sunken eyes. ? Cold hands and feet. ? Confusion. ? Lack of energy (lethargy). ? Difficulty waking up from sleep. ? Short-term weight loss. ? Unconsciousness. How is this diagnosed? This condition is diagnosed based on your symptoms and a physical exam. Blood and urine tests may be done to help confirm the diagnosis. How is this treated? Treatment for this condition depends on the severity. Mild or moderate dehydration can often be treated at home. Treatment should be started right away. Do not wait until dehydration becomes severe. Severe dehydration is an emergency and it needs to be treated in a hospital. Treatment for mild dehydration may include:  Drinking more fluids.  Replacing salts and minerals in your blood (electrolytes) that you may have lost. Treatment for moderate dehydration may include:  Drinking an oral rehydration solution (ORS). This is a drink that helps you replace fluids and electrolytes (rehydrate). It can be found at pharmacies and retail stores. Treatment for severe dehydration may include:  Receiving fluids through an IV tube.  Receiving an electrolyte solution through a feeding tube that is passed through your nose and   into your stomach (nasogastric tube, or NG tube).  Correcting any abnormalities in electrolytes.  Treating the underlying cause of dehydration. Follow these instructions at home:  If directed by your health care provider, drink an ORS: ? Make an ORS by following instructions on the  package. ? Start by drinking small amounts, about  cup (120 mL) every 5-10 minutes. ? Slowly increase how much you drink until you have taken the amount recommended by your health care provider.  Drink enough clear fluid to keep your urine clear or pale yellow. If you were told to drink an ORS, finish the ORS first, then start slowly drinking other clear fluids. Drink fluids such as: ? Water. Do not drink only water. Doing that can lead to having too little salt (sodium) in the body (hyponatremia). ? Ice chips. ? Fruit juice that you have added water to (diluted fruit juice). ? Low-calorie sports drinks.  Avoid: ? Alcohol. ? Drinks that contain a lot of sugar. These include high-calorie sports drinks, fruit juice that is not diluted, and soda. ? Caffeine. ? Foods that are greasy or contain a lot of fat or sugar.  Take over-the-counter and prescription medicines only as told by your health care provider.  Do not take sodium tablets. This can lead to having too much sodium in the body (hypernatremia).  Eat foods that contain a healthy balance of electrolytes, such as bananas, oranges, potatoes, tomatoes, and spinach.  Keep all follow-up visits as told by your health care provider. This is important. Contact a health care provider if:  You have abdominal pain that: ? Gets worse. ? Stays in one area (localizes).  You have a rash.  You have a stiff neck.  You are more irritable than usual.  You are sleepier or more difficult to wake up than usual.  You feel weak or dizzy.  You feel very thirsty.  You have urinated only a small amount of very dark urine over 6-8 hours. Get help right away if:  You have symptoms of severe dehydration.  You cannot drink fluids without vomiting.  Your symptoms get worse with treatment.  You have a fever.  You have a severe headache.  You have vomiting or diarrhea that: ? Gets worse. ? Does not go away.  You have blood or green matter  (bile) in your vomit.  You have blood in your stool. This may cause stool to look black and tarry.  You have not urinated in 6-8 hours.  You faint.  Your heart rate while sitting still is over 100 beats a minute.  You have trouble breathing. This information is not intended to replace advice given to you by your health care provider. Make sure you discuss any questions you have with your health care provider. Document Released: 01/01/2005 Document Revised: 07/29/2015 Document Reviewed: 02/25/2015 Elsevier Interactive Patient Education  2019 [employer]  

## 2018-01-16 NOTE — Progress Notes (Signed)
Patient complains of on-going constant mid abdominal pain. Patient rates the pain a 5 on the 0 to 10 pain scale. Dr. Maylon Peppers notified. Order given and carried out for Percocet 5/325 mg. Patient discharged via wheelchair accompanied by spouse.

## 2018-01-17 ENCOUNTER — Other Ambulatory Visit: Payer: Self-pay | Admitting: Hematology

## 2018-01-17 ENCOUNTER — Inpatient Hospital Stay: Payer: Medicare Other

## 2018-01-17 VITALS — BP 124/72 | HR 84 | Temp 97.9°F | Resp 24

## 2018-01-17 DIAGNOSIS — R059 Cough, unspecified: Secondary | ICD-10-CM

## 2018-01-17 DIAGNOSIS — E876 Hypokalemia: Secondary | ICD-10-CM

## 2018-01-17 DIAGNOSIS — E46 Unspecified protein-calorie malnutrition: Secondary | ICD-10-CM | POA: Diagnosis not present

## 2018-01-17 DIAGNOSIS — R05 Cough: Secondary | ICD-10-CM

## 2018-01-17 DIAGNOSIS — K5900 Constipation, unspecified: Secondary | ICD-10-CM | POA: Diagnosis not present

## 2018-01-17 DIAGNOSIS — C8190 Hodgkin lymphoma, unspecified, unspecified site: Secondary | ICD-10-CM

## 2018-01-17 DIAGNOSIS — Z5111 Encounter for antineoplastic chemotherapy: Secondary | ICD-10-CM | POA: Diagnosis not present

## 2018-01-17 DIAGNOSIS — C8198 Hodgkin lymphoma, unspecified, lymph nodes of multiple sites: Secondary | ICD-10-CM | POA: Diagnosis not present

## 2018-01-17 DIAGNOSIS — I471 Supraventricular tachycardia: Secondary | ICD-10-CM | POA: Diagnosis not present

## 2018-01-17 DIAGNOSIS — E860 Dehydration: Secondary | ICD-10-CM | POA: Diagnosis not present

## 2018-01-17 LAB — CMP (CANCER CENTER ONLY)
ALT: 26 U/L (ref 0–44)
AST: 20 U/L (ref 15–41)
Albumin: 3.4 g/dL — ABNORMAL LOW (ref 3.5–5.0)
Alkaline Phosphatase: 82 U/L (ref 38–126)
Anion gap: 8 (ref 5–15)
BUN: 14 mg/dL (ref 8–23)
CO2: 27 mmol/L (ref 22–32)
Calcium: 8.7 mg/dL — ABNORMAL LOW (ref 8.9–10.3)
Chloride: 100 mmol/L (ref 98–111)
Creatinine: 0.92 mg/dL (ref 0.61–1.24)
GFR, Est AFR Am: >60 mL/min (ref >60)
GFR, Estimated: >60 mL/min (ref >60)
Glucose, Bld: 127 mg/dL — ABNORMAL HIGH (ref 70–99)
Potassium: 2.9 mmol/L — CL (ref 3.5–5.1)
Sodium: 135 mmol/L (ref 135–145)
Total Bilirubin: 0.7 mg/dL (ref 0.3–1.2)
Total Protein: 6.3 g/dL — ABNORMAL LOW (ref 6.5–8.1)

## 2018-01-17 LAB — CBC WITH DIFFERENTIAL (CANCER CENTER ONLY)
ABS IMMATURE GRANULOCYTES: 0.01 10*3/uL (ref 0.00–0.07)
Basophils Absolute: 0 10*3/uL (ref 0.0–0.1)
Basophils Relative: 1 %
Eosinophils Absolute: 0 10*3/uL (ref 0.0–0.5)
Eosinophils Relative: 3 %
HCT: 31.7 % — ABNORMAL LOW (ref 39.0–52.0)
Hemoglobin: 10.6 g/dL — ABNORMAL LOW (ref 13.0–17.0)
IMMATURE GRANULOCYTES: 1 %
LYMPHS PCT: 48 %
Lymphs Abs: 0.8 10*3/uL (ref 0.7–4.0)
MCH: 28.1 pg (ref 26.0–34.0)
MCHC: 33.4 g/dL (ref 30.0–36.0)
MCV: 84.1 fL (ref 80.0–100.0)
Monocytes Absolute: 0.5 10*3/uL (ref 0.1–1.0)
Monocytes Relative: 29 %
NEUTROS ABS: 0.3 10*3/uL — AB (ref 1.7–7.7)
Neutrophils Relative %: 18 %
Platelet Count: 330 10*3/uL (ref 150–400)
RBC: 3.77 MIL/uL — ABNORMAL LOW (ref 4.22–5.81)
RDW: 12.3 % (ref 11.5–15.5)
WBC Count: 1.6 10*3/uL — ABNORMAL LOW (ref 4.0–10.5)
nRBC: 1.3 % — ABNORMAL HIGH (ref 0.0–0.2)

## 2018-01-17 LAB — MAGNESIUM: Magnesium: 1.7 mg/dL (ref 1.7–2.4)

## 2018-01-17 MED ORDER — AMOXICILLIN-POT CLAVULANATE 875-125 MG PO TABS: 1.0000 | ORAL_TABLET | Freq: Two times a day (BID) | ORAL | 0 refills | Status: AC

## 2018-01-17 MED ORDER — POTASSIUM CHLORIDE CRYS ER 20 MEQ PO TBCR: 20.0000 meq | EXTENDED_RELEASE_TABLET | Freq: Every day | ORAL | 5 refills | Status: DC

## 2018-01-17 MED ORDER — SODIUM CHLORIDE 0.9% FLUSH
10.0000 mL | Freq: Once | INTRAVENOUS | Status: AC | PRN
  Administered 2018-01-17: 10 mL
  Filled 2018-01-17: qty 10

## 2018-01-17 MED ORDER — SODIUM CHLORIDE 0.9 % IV SOLN
Freq: Once | INTRAVENOUS | Status: AC
  Administered 2018-01-17: 12:00:00 via INTRAVENOUS
  Filled 2018-01-17: qty 1000

## 2018-01-17 MED ORDER — LORAZEPAM 2 MG/ML IJ SOLN
0.5000 mg | Freq: Once | INTRAMUSCULAR | Status: AC
  Administered 2018-01-17: 0.5 mg via INTRAVENOUS

## 2018-01-17 MED ORDER — HEPARIN SOD (PORK) LOCK FLUSH 100 UNIT/ML IV SOLN
500.0000 [IU] | Freq: Once | INTRAVENOUS | Status: AC | PRN
  Administered 2018-01-17: 500 [IU]
  Filled 2018-01-17: qty 5

## 2018-01-17 MED ORDER — LORAZEPAM 2 MG/ML IJ SOLN
INTRAMUSCULAR | Status: AC
  Filled 2018-01-17: qty 1

## 2018-01-17 MED ORDER — SODIUM CHLORIDE 0.9 % IV SOLN
Freq: Once | INTRAVENOUS | Status: AC
  Administered 2018-01-17: 12:00:00 via INTRAVENOUS
  Filled 2018-01-17: qty 250

## 2018-01-17 MED ORDER — MAGNESIUM OXIDE 400 (241.3 MG) MG PO TABS: 400.0000 mg | ORAL_TABLET | Freq: Two times a day (BID) | ORAL | 4 refills | Status: AC

## 2018-01-17 MED ORDER — POTASSIUM CHLORIDE CRYS ER 20 MEQ PO TBCR
20.0000 meq | EXTENDED_RELEASE_TABLET | Freq: Once | ORAL | Status: AC
  Administered 2018-01-17: 20 meq via ORAL
  Filled 2018-01-17: qty 1

## 2018-01-17 NOTE — Progress Notes (Signed)
Pt arrived to infusion room via w/c and once in recliner chair was shaking and trembling to all extremites.  Dr. Maylon Peppers in infusion room and assessed patient.  Order received for stat labs and Ativan 0.5 mg IV now.

## 2018-01-17 NOTE — Progress Notes (Signed)
01/17/18  Received verbal order from Dr Maylon Peppers for patient to receive:  1 liter NS with 20 mEq potassium chloride to run over 2 hours and 20 mEq potassium chloride oral to be given stat.  Orders repeated back to Dr Maylon Peppers and entered into Epic as ordered.  V.O. Dr Zhao/ Henreitta Leber, PharmD

## 2018-01-17 NOTE — Progress Notes (Signed)
1150-Trembling ceased.  Pt resting after Ativan 0.5 mg IV given.  Pt.'s wife at chairside.

## 2018-01-18 ENCOUNTER — Other Ambulatory Visit: Payer: Self-pay | Admitting: Hematology

## 2018-01-18 DIAGNOSIS — C8190 Hodgkin lymphoma, unspecified, unspecified site: Secondary | ICD-10-CM

## 2018-01-20 ENCOUNTER — Inpatient Hospital Stay: Payer: Medicare Other

## 2018-01-20 ENCOUNTER — Encounter: Payer: Self-pay | Admitting: Hematology

## 2018-01-20 ENCOUNTER — Inpatient Hospital Stay (HOSPITAL_BASED_OUTPATIENT_CLINIC_OR_DEPARTMENT_OTHER): Payer: Medicare Other | Admitting: Hematology

## 2018-01-20 ENCOUNTER — Other Ambulatory Visit: Payer: Self-pay | Admitting: *Deleted

## 2018-01-20 ENCOUNTER — Telehealth: Payer: Self-pay | Admitting: Hematology

## 2018-01-20 DIAGNOSIS — D701 Agranulocytosis secondary to cancer chemotherapy: Secondary | ICD-10-CM

## 2018-01-20 DIAGNOSIS — E876 Hypokalemia: Secondary | ICD-10-CM

## 2018-01-20 DIAGNOSIS — R059 Cough, unspecified: Secondary | ICD-10-CM

## 2018-01-20 DIAGNOSIS — E46 Unspecified protein-calorie malnutrition: Secondary | ICD-10-CM | POA: Diagnosis not present

## 2018-01-20 DIAGNOSIS — C8198 Hodgkin lymphoma, unspecified, lymph nodes of multiple sites: Secondary | ICD-10-CM | POA: Diagnosis not present

## 2018-01-20 DIAGNOSIS — I2510 Atherosclerotic heart disease of native coronary artery without angina pectoris: Secondary | ICD-10-CM | POA: Diagnosis not present

## 2018-01-20 DIAGNOSIS — C8190 Hodgkin lymphoma, unspecified, unspecified site: Secondary | ICD-10-CM

## 2018-01-20 DIAGNOSIS — I700 Atherosclerosis of aorta: Secondary | ICD-10-CM

## 2018-01-20 DIAGNOSIS — K5900 Constipation, unspecified: Secondary | ICD-10-CM | POA: Diagnosis not present

## 2018-01-20 DIAGNOSIS — T451X5A Adverse effect of antineoplastic and immunosuppressive drugs, initial encounter: Secondary | ICD-10-CM

## 2018-01-20 DIAGNOSIS — D709 Neutropenia, unspecified: Secondary | ICD-10-CM

## 2018-01-20 DIAGNOSIS — R109 Unspecified abdominal pain: Secondary | ICD-10-CM | POA: Diagnosis not present

## 2018-01-20 DIAGNOSIS — I471 Supraventricular tachycardia: Secondary | ICD-10-CM | POA: Diagnosis not present

## 2018-01-20 DIAGNOSIS — R05 Cough: Secondary | ICD-10-CM

## 2018-01-20 DIAGNOSIS — Z5111 Encounter for antineoplastic chemotherapy: Secondary | ICD-10-CM | POA: Diagnosis not present

## 2018-01-20 DIAGNOSIS — E860 Dehydration: Secondary | ICD-10-CM | POA: Diagnosis not present

## 2018-01-20 DIAGNOSIS — Z79899 Other long term (current) drug therapy: Secondary | ICD-10-CM

## 2018-01-20 DIAGNOSIS — D6481 Anemia due to antineoplastic chemotherapy: Secondary | ICD-10-CM | POA: Diagnosis not present

## 2018-01-20 LAB — CBC WITH DIFFERENTIAL (CANCER CENTER ONLY)
Abs Immature Granulocytes: 0.28 10*3/uL — ABNORMAL HIGH (ref 0.00–0.07)
Basophils Absolute: 0 10*3/uL (ref 0.0–0.1)
Basophils Relative: 1 %
Eosinophils Absolute: 0.1 10*3/uL (ref 0.0–0.5)
Eosinophils Relative: 4 %
HEMATOCRIT: 32.6 % — AB (ref 39.0–52.0)
Hemoglobin: 10.7 g/dL — ABNORMAL LOW (ref 13.0–17.0)
Immature Granulocytes: 8 %
Lymphocytes Relative: 26 %
Lymphs Abs: 0.9 10*3/uL (ref 0.7–4.0)
MCH: 27.9 pg (ref 26.0–34.0)
MCHC: 32.8 g/dL (ref 30.0–36.0)
MCV: 85.1 fL (ref 80.0–100.0)
Monocytes Absolute: 1 10*3/uL (ref 0.1–1.0)
Monocytes Relative: 27 %
Neutro Abs: 1.3 10*3/uL — ABNORMAL LOW (ref 1.7–7.7)
Neutrophils Relative %: 34 %
Platelet Count: 388 10*3/uL (ref 150–400)
RBC: 3.83 MIL/uL — ABNORMAL LOW (ref 4.22–5.81)
RDW: 13.2 % (ref 11.5–15.5)
WBC Count: 3.6 10*3/uL — ABNORMAL LOW (ref 4.0–10.5)
nRBC: 1.4 % — ABNORMAL HIGH (ref 0.0–0.2)

## 2018-01-20 LAB — CMP (CANCER CENTER ONLY)
ALT: 27 U/L (ref 0–44)
AST: 19 U/L (ref 15–41)
Albumin: 3.3 g/dL — ABNORMAL LOW (ref 3.5–5.0)
Alkaline Phosphatase: 73 U/L (ref 38–126)
Anion gap: 12 (ref 5–15)
BUN: 12 mg/dL (ref 8–23)
CO2: 26 mmol/L (ref 22–32)
CREATININE: 1.02 mg/dL (ref 0.61–1.24)
Calcium: 8.6 mg/dL — ABNORMAL LOW (ref 8.9–10.3)
Chloride: 97 mmol/L — ABNORMAL LOW (ref 98–111)
GFR, Est AFR Am: >60 mL/min (ref >60)
GFR, Estimated: >60 mL/min (ref >60)
Glucose, Bld: 185 mg/dL — ABNORMAL HIGH (ref 70–99)
Potassium: 2.8 mmol/L — CL (ref 3.5–5.1)
Sodium: 135 mmol/L (ref 135–145)
Total Bilirubin: 0.8 mg/dL (ref 0.3–1.2)
Total Protein: 6.4 g/dL — ABNORMAL LOW (ref 6.5–8.1)

## 2018-01-20 LAB — MAGNESIUM: Magnesium: 1.6 mg/dL — ABNORMAL LOW (ref 1.7–2.4)

## 2018-01-20 MED ORDER — SODIUM CHLORIDE 0.9 % IV SOLN
Freq: Once | INTRAVENOUS | Status: AC
  Administered 2018-01-20: 10:00:00 via INTRAVENOUS
  Filled 2018-01-20: qty 250

## 2018-01-20 MED ORDER — POTASSIUM CHLORIDE CRYS ER 20 MEQ PO TBCR: 20.0000 meq | EXTENDED_RELEASE_TABLET | Freq: Two times a day (BID) | ORAL | 0 refills | Status: DC

## 2018-01-20 MED ORDER — LEUCOVORIN CALCIUM 15 MG PO TABS: 15.0000 mg | ORAL_TABLET | Freq: Every day | ORAL | 5 refills | Status: DC

## 2018-01-20 MED ORDER — SODIUM CHLORIDE 0.9 % IV SOLN: 375.0000 mg/m2 | Freq: Once | INTRAVENOUS | Status: DC

## 2018-01-20 MED ORDER — SODIUM CHLORIDE 0.9 % IV SOLN: Freq: Once | INTRAVENOUS | Status: DC

## 2018-01-20 MED ORDER — DOXORUBICIN HCL CHEMO IV INJECTION 2 MG/ML: 25.0000 mg/m2 | Freq: Once | INTRAVENOUS | Status: DC

## 2018-01-20 MED ORDER — HEPARIN SOD (PORK) LOCK FLUSH 100 UNIT/ML IV SOLN
500.0000 [IU] | Freq: Once | INTRAVENOUS | Status: AC | PRN
  Administered 2018-01-20: 500 [IU]
  Filled 2018-01-20: qty 5

## 2018-01-20 MED ORDER — SENNOSIDES-DOCUSATE SODIUM 8.6-50 MG PO TABS: 1.0000 | ORAL_TABLET | Freq: Two times a day (BID) | ORAL | 5 refills | Status: DC

## 2018-01-20 MED ORDER — PALONOSETRON HCL INJECTION 0.25 MG/5ML: 0.2500 mg | Freq: Once | INTRAVENOUS | Status: DC

## 2018-01-20 MED ORDER — VINBLASTINE SULFATE CHEMO INJECTION 1 MG/ML: 6.0000 mg/m2 | Freq: Once | INTRAVENOUS | Status: DC

## 2018-01-20 MED ORDER — SODIUM CHLORIDE 0.9% FLUSH
10.0000 mL | INTRAVENOUS | Status: DC | PRN
  Administered 2018-01-20: 10 mL
  Filled 2018-01-20: qty 10

## 2018-01-20 NOTE — Progress Notes (Signed)
[name] OFFICE PROGRESS NOTE  Patient Care Team: Tysinger, Camelia Eng, PA-C as PCP - General (Family Medicine) Nahser, Wonda Cheng, MD as PCP - Cardiology (Cardiology)  HEME/ONC OVERVIEW: 1. Stage III classic Hodgkin lymphoma, subtype NOS due to limited specimen -Bx-proven R cervical LN involvement in 09/2017; PET in 11/2017 showed cervical, mediastinal and upper abdominal LN involement; treatment delayed due to pt traveling on a cruise after diagnosis -11/2017 - present: ABVD -12/2017: PET after 2 cycles of ABVD showed interval CR   2. Port placement in 11/2017   CHEMOTHERAPY REGIMEN:  11/26/2017 - present: ABVD x 2 cycles, CR; plan for AVD x 4 cycles  ASSESSMENT & PLAN:   Stage III classic Hodgkin lymphoma, favorable risk  -I independently reviewed the radiologic images of recent PET, and agree the findings as documented -In summary, PET showed interval complete response after 2 cycles of ABVD -Given the excellent response, we will de-escalate chemotherapy to AVD for 4 more cycles  -Due to severe dehydration and malnutrition, we will delay chemotherapy by 1 week (ie 01/28/2018) to allow the patient to recover before proceeding with Cycle 3 of treatment  -PRN anti-emetics: Zofran, Compazine, dexamethasone and Ativan  Malnutrition, dehydration -Patient has been receiving daily IV fluid with 1L NS M-F since 1/2/202 due to severe dehydration and malnutrition -He appears a little better today -We will continue IV fluid through next week (ie 01/31/2018) and reassess if he needs any additional fluid support -I will also refer the patient to nutrition for dietary recommendations  -A prescription for leucovorin was sent to the patient's pharmacy  Abdominal cramping -Likely secondary to chemotherapy side effects and constipation -Bowel regimen: Sennokot, Miralax, and PRN lactulose  -No improvement with a trial of Reglan; discontinued   Abdominal pain -Improving with bowel  regimen and PRN liquid morphine -We will continue to monitor it for now   Cough -Improving overall; he is completing a course of empiric Augmentin -If dyspnea and cough persist or worsen, we may consider obtaining CT chest w/o contrast and TTE to rule out other cardiopulmonary causes   Chemotherapy-associated neutropenia  -Patient denies any symptoms of infection  -WBC 1.7k w/ ANC 800 -Neutropenia not an indication to delay treatment for ABVD; G-CSF not indicated unless severe infection is suspected  -The patient was counseled on any concerning symptoms, such as fever (temperature > 100.4), for which he should contact the clinic promptly  Chemotherapy-associated anemia -Hgb 10.7, stable -Patient denies any symptoms of bleeding -We will continue to monitor it for now  Hypokalemia -On KCl supplement   Hypomagnesemia -On magnesium oxide   All questions were answered. The patient knows to call the clinic with any problems, questions or concerns. No barriers to learning was detected.  A total of more than 25 minutes were spent face-to-face with the patient during this encounter and over half of that time was spent on counseling and coordination of care as outlined above.   Return on 01/27/2018 for labs, port flush, IV fluid and clinic follow-up. C3 chemo tentatively scheduled on 01/28/2018.   [name], MD 01/20/2018 10:43 AM  CHIEF COMPLAINT: "I am doing a little better"  INTERVAL HISTORY: [name] returns to clinic for follow-up of Hodgkin lymphoma.  Patient reports that since starting IV fluids last week, he has felt a little bit better. He has little bit more energy, and is able to walk from bed to bathroom but not much more beyond that.  He is still having very  limited oral intake, and drinks 2-3 ensures per day and some Gatorade.  His cough has improved a little, and he denies any fever, chill, night sweats, or sputum production.  His fatigue and exertional dyspnea is about the same.   His bowel movement is soft and regular.  Abdominal cramping improved with bowel regimen and as needed liquid morphine.  He denies any other complaint today.  SUMMARY OF ONCOLOGIC HISTORY:   Hodgkin lymphoma (Bradley Novak)   08/29/2017 Initial Diagnosis    Hodgkin lymphoma ([name])    10/07/2017 Imaging    CT neck w/ contrast: Enlarged nodes in the right jugular chain, posterior triangle, and supraclavicular fossa. The pattern is most concerning for lymphoma; multiple nodes are superficial and amenable to biopsy.  CT CAP w/ contrast: 1. Right supraclavicular and subcarinal adenopathy is identified. Differential considerations include lymphoproliferative disorder, metastatic adenopathy, granulomatous inflammation/infection or reactive adenopathy. Consider further evaluation with tissue sampling. 2. No acute findings, mass or adenopathy within the abdomen. 3. Lungs are clear.  No evidence for pneumonia.  4.  Aortic Atherosclerosis (ICD10-I70.0).    10/25/2017 Procedure    US-guided R cervical LN biopsy    10/25/2017 Pathology Results    (Accession: [id number]) Lymph node, needle/core biopsy, Right Cervical - CLASSIC HODGKIN LYMPHOMA - SEE COMMENT - CD30 (1%) - Unable to subtype due to limited sample specimen    11/12/2017 Cancer Staging    Staging form: Hodgkin and Non-Hodgkin Lymphoma, AJCC 8th Edition - Clinical stage from 11/12/2017: Stage II (Hodgkin lymphoma, A - Asymptomatic) - Signed by [name], MD on 11/12/2017    11/21/2017 -  Chemotherapy    The patient had DOXOrubicin (ADRIAMYCIN) chemo injection 60 mg, 25 mg/m2 = 60 mg, Intravenous,  Once, 0 of 4 cycles palonosetron (ALOXI) injection 0.25 mg, 0.25 mg, Intravenous,  Once, 0 of 4 cycles bleomycin (BLEOCIN) 24 Units in sodium chloride 0.9 % 50 mL chemo infusion, 10 Units/m2 = 24 Units, Intravenous,  Once, 0 of 4 cycles dacarbazine (DTIC) 910 mg in sodium chloride 0.9 % 250 mL chemo infusion, 375 mg/m2 = 910 mg, Intravenous,  Once, 0 of  4 cycles vinBLAStine (VELBAN) 14.6 mg in sodium chloride 0.9 % 50 mL chemo infusion, 6 mg/m2 = 14.6 mg, Intravenous, Once, 0 of 4 cycles fosaprepitant (EMEND) 150 mg, dexamethasone (DECADRON) 12 mg in sodium chloride 0.9 % 145 mL IVPB, , Intravenous,  Once, 0 of 4 cycles  for chemotherapy treatment.     11/21/2017 Imaging    PET: IMPRESSION: 1. Multiple relatively small hypermetabolic lymph nodes in the RIGHT neck, mediastinum and limited involvement of the upper abdomen. Lymph node activity is high (greater than liver, [place] 4). 2. The most intense enlarged lymph node is anterior to the sternocleidomastoid muscle on the RIGHT. There are multiple assessable lymph nodes in the RIGHT cervical chain. 3. Minimal hypermetabolic adenopathy in the upper abdomen. No pelvic or inguinal metastatic adenopathy. 4. Normal spleen and bone marrow. 5. Single focus of metabolic activity in the sigmoid colon. Recommend screening colonoscopy if patient is not current for such.    01/10/2018 Imaging    PET (after 2 cycles of ABVD) IMPRESSION: 1. Interval complete response to therapy. No abnormal areas of persistent hypermetabolic adenopathy identified. 2.  Aortic Atherosclerosis (ICD10-I70.0). 3. Multi vessel coronary artery atherosclerotic calcifications.     REVIEW OF SYSTEMS:   Constitutional: ( - ) fevers, ( - )  chills , ( - ) night sweats Eyes: ( - ) blurriness of vision, ( - )  double vision, ( - ) watery eyes Ears, nose, mouth, throat, and face: ( - ) mucositis, ( - ) sore throat Respiratory: ( + ) cough, ( + ) exertional dyspnea, ( - ) wheezes Cardiovascular: ( - ) palpitation, ( - ) chest discomfort, ( - ) lower extremity swelling Gastrointestinal:  ( - ) nausea, ( + ) heartburn, ( + ) change in bowel habits Skin: ( - ) abnormal skin rashes Lymphatics: ( - ) easy bruising Neurological: ( - ) numbness, ( - ) tingling, ( + ) generalized weaknesses Behavioral/Psych: ( - ) mood change, ( - ) new  changes  All other systems were reviewed with the patient and are negative.  I have reviewed the past medical history, past surgical history, social history and family history with the patient and they are unchanged from previous note.  ALLERGIES:  has No Known Allergies.  MEDICATIONS:  Current Outpatient Medications  Medication Sig Dispense Refill  . amoxicillin-clavulanate (AUGMENTIN) 875-125 MG tablet Take 1 tablet by mouth 2 (two) times daily for 7 days. 14 tablet 0  . aspirin EC 81 MG tablet Take 1 tablet (81 mg total) by mouth daily. 90 tablet 3  . atorvastatin (LIPITOR) 20 MG tablet TAKE 1 TABLET BY MOUTH DAILY 90 tablet 2  . carvedilol (COREG) 6.25 MG tablet Take 1 tablet (6.25 mg total) by mouth 2 (two) times daily. 180 tablet 3  . Cholecalciferol (VITAMIN D PO) Take 1 capsule by mouth at bedtime.    Marland Kitchen dexamethasone (DECADRON) 4 MG tablet Take 2 tablets by mouth once a day on the day after chemotherapy and then take 2 tablets two times a day for 2 days. Take with food. 30 tablet 1  . diclofenac (VOLTAREN) 75 MG EC tablet Take 75 mg by mouth daily.    Marland Kitchen ibuprofen (ADVIL,MOTRIN) 200 MG tablet Take 400 mg by mouth daily as needed for moderate pain.    Marland Kitchen lactulose (CHRONULAC) 10 GM/15ML solution Take 15 mLs (10 g total) by mouth 2 (two) times daily as needed for up to 14 days for mild constipation. 236 mL 1  . leucovorin (WELLCOVORIN) 15 MG tablet Take 1 tablet (15 mg total) by mouth daily. 30 tablet 5  . lidocaine-prilocaine (EMLA) cream Apply to affected area once (Patient not taking: Reported on 01/14/2018) 30 g 3  . LORazepam (ATIVAN) 0.5 MG tablet Take 1 tablet (0.5 mg total) by mouth every 6 (six) hours as needed (Nausea or vomiting). 30 tablet 0  . losartan (COZAAR) 100 MG tablet Take 1 tablet (100 mg total) by mouth daily. 90 tablet 3  . magnesium oxide (MAG-OX) 400 (241.3 Mg) MG tablet Take 1 tablet (400 mg total) by mouth 2 (two) times daily. 60 tablet 4  . metoCLOPramide  (REGLAN) 10 MG tablet Take 1 tablet (10 mg total) by mouth 3 (three) times daily before meals for 14 days. 42 tablet 0  . Morphine Sulfate (MORPHINE CONCENTRATE) 10 mg / 0.5 ml concentrated solution Take 0.5 mLs (10 mg total) by mouth every 4 (four) hours as needed for up to 14 days for severe pain. 60 mL 0  . ondansetron (ZOFRAN) 8 MG tablet Take 1 tablet (8 mg total) by mouth 2 (two) times daily as needed. Start on the third day after chemotherapy. 30 tablet 1  . polyethylene glycol (MIRALAX) packet Take 17 g by mouth 2 (two) times daily. 14 each 5  . potassium chloride SA (K-DUR,KLOR-CON) 20 MEQ tablet Take 1 tablet (  20 mEq total) by mouth daily. 30 tablet 5  . prochlorperazine (COMPAZINE) 10 MG tablet Take 1 tablet (10 mg total) by mouth every 6 (six) hours as needed (Nausea or vomiting). 30 tablet 1  . ranitidine (ZANTAC) 150 MG tablet Take 150 mg by mouth daily as needed for heartburn.    . senna-docusate (SENOKOT-S) 8.6-50 MG tablet Take 1 tablet by mouth 2 (two) times daily. 60 tablet 5   No current facility-administered medications for this visit.    Facility-Administered Medications Ordered in Other Visits  Medication Dose Route Frequency Provider Last Rate Last Dose  . 0.9 %  sodium chloride infusion   Intravenous Once [name], MD      . dacarbazine (DTIC) 910 mg in sodium chloride 0.9 % 250 mL chemo infusion  375 mg/m2 (Treatment Plan Recorded) Intravenous Once [name], MD      . DOXOrubicin (ADRIAMYCIN) chemo injection 60 mg  25 mg/m2 (Treatment Plan Recorded) Intravenous Once [name], MD      . fosaprepitant (EMEND) 150 mg, dexamethasone (DECADRON) 12 mg in sodium chloride 0.9 % 145 mL IVPB   Intravenous Once [name], MD      . heparin lock flush 100 unit/mL  500 Units Intracatheter Once PRN [name], MD      . palonosetron (ALOXI) injection 0.25 mg  0.25 mg Intravenous Once [name], MD      . sodium chloride flush (NS) 0.9 % injection 10 mL  10 mL Intracatheter PRN [name], MD      . vinBLAStine (VELBAN) 14.6 mg in sodium chloride 0.9 % 50 mL chemo infusion  6 mg/m2 (Treatment Plan Recorded) Intravenous Once [name], MD        PHYSICAL EXAMINATION: ECOG PERFORMANCE STATUS: 2 - Symptomatic, <50% confined to bed  There were no vitals filed for this visit. There is no height or weight on file to calculate BMI.  There were no vitals filed for this visit.   Wt Readings from Last 3 Encounters:  01/20/18 246 lb 12 oz (111.9 kg)  01/14/18 246 lb 14.4 oz (112 kg)  01/06/18 253 lb (114.8 kg)   Temp Readings from Last 3 Encounters:  01/20/18 98.1 F (36.7 C) (Oral)  01/17/18 97.9 F (36.6 C) (Oral)  01/16/18 98.1 F (36.7 C) (Oral)   BP Readings from Last 3 Encounters:  01/20/18 123/85  01/17/18 124/72  01/16/18 110/71   Pulse Readings from Last 3 Encounters:  01/20/18 90  01/17/18 84  01/16/18 79   GENERAL: alert, no distress, fatigued, slightly improving since last week  SKIN: skin color, texture, turgor are normal, no rashes or significant lesions EYES: conjunctiva are pink and non-injected, sclera clear OROPHARYNX: no exudate, no erythema; lips, buccal mucosa, and tongue normal  NECK: supple, non-tender LYMPH:  no palpable cervical lymphadenopathy  LUNGS: clear to auscultation and percussion with normal breathing effort HEART: regular rate & rhythm and no murmurs and no lower extremity edema ABDOMEN: soft, non-tender, non-distended, normal bowel sounds Musculoskeletal: no cyanosis of digits and no clubbing  PSYCH: alert & oriented x 3, fluent speech NEURO: no focal motor/sensory deficits  LABORATORY DATA:  I have reviewed the data as listed    Component Value Date/Time   NA 135 01/17/2018 1127   NA 141 08/29/2017 1155   K 2.9 (LL) 01/17/2018 1127   CL 100 01/17/2018 1127   CO2 27 01/17/2018 1127   GLUCOSE 127 (H) 01/17/2018 1127   BUN 14 01/17/2018  1127   BUN 16 08/29/2017 1155   CREATININE 0.92 01/17/2018 1127   CREATININE  0.83 09/27/2014 0001   CALCIUM 8.7 (L) 01/17/2018 1127   PROT 6.3 (L) 01/17/2018 1127   PROT 7.4 08/29/2017 1155   ALBUMIN 3.4 (L) 01/17/2018 1127   ALBUMIN 4.1 08/29/2017 1155   AST 20 01/17/2018 1127   ALT 26 01/17/2018 1127   ALKPHOS 82 01/17/2018 1127   BILITOT 0.7 01/17/2018 1127   GFRNONAA >60 01/17/2018 1127   GFRAA >60 01/17/2018 1127    No results found for: SPEP, UPEP  Lab Results  Component Value Date   WBC 3.6 (L) 01/20/2018   NEUTROABS PENDING 01/20/2018   HGB 10.7 (L) 01/20/2018   HCT 32.6 (L) 01/20/2018   MCV 85.1 01/20/2018   PLT 388 01/20/2018      Chemistry      Component Value Date/Time   NA 135 01/17/2018 1127   NA 141 08/29/2017 1155   K 2.9 (LL) 01/17/2018 1127   CL 100 01/17/2018 1127   CO2 27 01/17/2018 1127   BUN 14 01/17/2018 1127   BUN 16 08/29/2017 1155   CREATININE 0.92 01/17/2018 1127   CREATININE 0.83 09/27/2014 0001      Component Value Date/Time   CALCIUM 8.7 (L) 01/17/2018 1127   ALKPHOS 82 01/17/2018 1127   AST 20 01/17/2018 1127   ALT 26 01/17/2018 1127   BILITOT 0.7 01/17/2018 1127

## 2018-01-20 NOTE — Patient Instructions (Signed)

## 2018-01-20 NOTE — Telephone Encounter (Signed)
-----   Message from [name], MD sent at 01/17/2018  4:23 PM EST ----- Can we let Bradley Novak know that his Magnesium was slightly low, and I have sent a prescription for magensium supplement to his pharmacy?   Thanks.  [place] ----- Message ----- From: Buel Ream, Lab [facility] [place] Sent: 01/17/2018  11:40 AM EST To: [name], MD

## 2018-01-20 NOTE — Patient Instructions (Signed)
Dehydration, Adult  Dehydration is when there is not enough fluid or water in your body. This happens when you lose more fluids than you take in. Dehydration can range from mild to very bad. It should be treated right away to keep it from getting very bad. Symptoms of mild dehydration may include:  Thirst.  Dry lips.  Slightly dry mouth.  Dry, warm skin.  Dizziness. Symptoms of moderate dehydration may include:  Very dry mouth.  Muscle cramps.  Dark pee (urine). Pee may be the color of tea.  Your body making less pee.  Your eyes making fewer tears.  Heartbeat that is uneven or faster than normal (palpitations).  Headache.  Light-headedness, especially when you stand up from sitting.  Fainting (syncope). Symptoms of very bad dehydration may include:  Changes in skin, such as: ? Cold and clammy skin. ? Blotchy (mottled) or pale skin. ? Skin that does not quickly return to normal after being lightly pinched and let go (poor skin turgor).  Changes in body fluids, such as: ? Feeling very thirsty. ? Your eyes making fewer tears. ? Not sweating when body temperature is high, such as in hot weather. ? Your body making very little pee.  Changes in vital signs, such as: ? Weak pulse. ? Pulse that is more than 100 beats a minute when you are sitting still. ? Fast breathing. ? Low blood pressure.  Other changes, such as: ? Sunken eyes. ? Cold hands and feet. ? Confusion. ? Lack of energy (lethargy). ? Trouble waking up from sleep. ? Short-term weight loss. ? Unconsciousness. Follow these instructions at home:   If told by your doctor, drink an ORS: ? Make an ORS by using instructions on the package. ? Start by drinking small amounts, about  cup (120 mL) every 5-10 minutes. ? Slowly drink more until you have had the amount that your doctor said to have.  Drink enough clear fluid to keep your pee clear or pale yellow. If you were told to drink an ORS, finish the  ORS first, then start slowly drinking clear fluids. Drink fluids such as: ? Water. Do not drink only water by itself. Doing that can make the salt (sodium) level in your body get too low (hyponatremia). ? Ice chips. ? Fruit juice that you have added water to (diluted). ? Low-calorie sports drinks.  Avoid: ? Alcohol. ? Drinks that have a lot of sugar. These include high-calorie sports drinks, fruit juice that does not have water added, and soda. ? Caffeine. ? Foods that are greasy or have a lot of fat or sugar.  Take over-the-counter and prescription medicines only as told by your doctor.  Do not take salt tablets. Doing that can make the salt level in your body get too high (hypernatremia).  Eat foods that have minerals (electrolytes). Examples include bananas, oranges, potatoes, tomatoes, and spinach.  Keep all follow-up visits as told by your doctor. This is important. Contact a doctor if:  You have belly (abdominal) pain that: ? Gets worse. ? Stays in one area (localizes).  You have a rash.  You have a stiff neck.  You get angry or annoyed more easily than normal (irritability).  You are more sleepy than normal.  You have a harder time waking up than normal.  You feel: ? Weak. ? Dizzy. ? Very thirsty.  You have peed (urinated) only a small amount of very dark pee during 6-8 hours. Get help right away if:  You have   symptoms of very bad dehydration.  You cannot drink fluids without throwing up (vomiting).  Your symptoms get worse with treatment.  You have a fever.  You have a very bad headache.  You are throwing up or having watery poop (diarrhea) and it: ? Gets worse. ? Does not go away.  You have blood or something green (bile) in your throw-up.  You have blood in your poop (stool). This may cause poop to look black and tarry.  You have not peed in 6-8 hours.  You pass out (faint).  Your heart rate when you are sitting still is more than 100 beats a  minute.  You have trouble breathing. This information is not intended to replace advice given to you by your health care provider. Make sure you discuss any questions you have with your health care provider. Document Released: 10/28/2008 Document Revised: 07/22/2015 Document Reviewed: 02/25/2015 Elsevier Interactive Patient Education  2019 [employer]  

## 2018-01-20 NOTE — Telephone Encounter (Signed)
Pt seen in Office today, MD increased pt K+ 20MEQ 1 tablet BID. Pt does not have medication insurance, reviewed with MD ok to send in Rx for 90 supply. Discussed above with pt.

## 2018-01-20 NOTE — Telephone Encounter (Signed)
Per Dr. Maylon Peppers, I informed the patient about the magnesium prescription while he was in the infusion room today. Wife is picking prescription up today.

## 2018-01-20 NOTE — Telephone Encounter (Signed)
Appointments scheduled avs/calendar printed per 01/20/2018 los

## 2018-01-21 ENCOUNTER — Ambulatory Visit: Payer: Medicare Other

## 2018-01-21 ENCOUNTER — Other Ambulatory Visit: Payer: Self-pay

## 2018-01-21 ENCOUNTER — Emergency Department (HOSPITAL_BASED_OUTPATIENT_CLINIC_OR_DEPARTMENT_OTHER): Payer: Medicare Other

## 2018-01-21 ENCOUNTER — Other Ambulatory Visit: Payer: Self-pay | Admitting: Hematology

## 2018-01-21 ENCOUNTER — Inpatient Hospital Stay: Payer: Medicare Other

## 2018-01-21 ENCOUNTER — Encounter (HOSPITAL_BASED_OUTPATIENT_CLINIC_OR_DEPARTMENT_OTHER): Payer: Self-pay | Admitting: Emergency Medicine

## 2018-01-21 ENCOUNTER — Telehealth: Payer: Self-pay | Admitting: Medical

## 2018-01-21 ENCOUNTER — Emergency Department (HOSPITAL_BASED_OUTPATIENT_CLINIC_OR_DEPARTMENT_OTHER)
Admission: EM | Admit: 2018-01-21 | Discharge: 2018-01-21 | Disposition: A | Discharge status: Home / Self Care | Payer: Medicare Other | Attending: Emergency Medicine | Admitting: Emergency Medicine

## 2018-01-21 DIAGNOSIS — I471 Supraventricular tachycardia, unspecified: Secondary | ICD-10-CM

## 2018-01-21 DIAGNOSIS — R079 Chest pain, unspecified: Secondary | ICD-10-CM | POA: Diagnosis not present

## 2018-01-21 DIAGNOSIS — E860 Dehydration: Secondary | ICD-10-CM | POA: Diagnosis not present

## 2018-01-21 DIAGNOSIS — Z79899 Other long term (current) drug therapy: Secondary | ICD-10-CM | POA: Insufficient documentation

## 2018-01-21 DIAGNOSIS — E876 Hypokalemia: Secondary | ICD-10-CM | POA: Diagnosis not present

## 2018-01-21 DIAGNOSIS — C8190 Hodgkin lymphoma, unspecified, unspecified site: Secondary | ICD-10-CM | POA: Insufficient documentation

## 2018-01-21 DIAGNOSIS — I10 Essential (primary) hypertension: Secondary | ICD-10-CM | POA: Diagnosis not present

## 2018-01-21 DIAGNOSIS — R0600 Dyspnea, unspecified: Secondary | ICD-10-CM

## 2018-01-21 DIAGNOSIS — R000 Tachycardia, unspecified: Secondary | ICD-10-CM | POA: Diagnosis present

## 2018-01-21 DIAGNOSIS — R059 Cough, unspecified: Secondary | ICD-10-CM

## 2018-01-21 DIAGNOSIS — Z7982 Long term (current) use of aspirin: Secondary | ICD-10-CM | POA: Insufficient documentation

## 2018-01-21 DIAGNOSIS — R05 Cough: Secondary | ICD-10-CM

## 2018-01-21 LAB — CBC WITH DIFFERENTIAL/PLATELET
Abs Immature Granulocytes: 0.64 10*3/uL — ABNORMAL HIGH (ref 0.00–0.07)
Basophils Absolute: 0.1 10*3/uL (ref 0.0–0.1)
Basophils Relative: 1 %
Eosinophils Absolute: 0.1 10*3/uL (ref 0.0–0.5)
Eosinophils Relative: 2 %
HCT: 31.4 % — ABNORMAL LOW (ref 39.0–52.0)
Hemoglobin: 10.1 g/dL — ABNORMAL LOW (ref 13.0–17.0)
IMMATURE GRANULOCYTES: 10 %
LYMPHS PCT: 16 %
Lymphs Abs: 1 10*3/uL (ref 0.7–4.0)
MCH: 27.6 pg (ref 26.0–34.0)
MCHC: 32.2 g/dL (ref 30.0–36.0)
MCV: 85.8 fL (ref 80.0–100.0)
Monocytes Absolute: 1.4 10*3/uL — ABNORMAL HIGH (ref 0.1–1.0)
Monocytes Relative: 22 %
Neutro Abs: 3.1 10*3/uL (ref 1.7–7.7)
Neutrophils Relative %: 49 %
PLATELETS: 381 10*3/uL (ref 150–400)
RBC: 3.66 MIL/uL — ABNORMAL LOW (ref 4.22–5.81)
RDW: 13.6 % (ref 11.5–15.5)
WBC: 6.4 10*3/uL (ref 4.0–10.5)
nRBC: 1.7 % — ABNORMAL HIGH (ref 0.0–0.2)

## 2018-01-21 LAB — MAGNESIUM: Magnesium: 1.9 mg/dL (ref 1.7–2.4)

## 2018-01-21 LAB — COMPREHENSIVE METABOLIC PANEL
ALT: 33 U/L (ref 0–44)
AST: 31 U/L (ref 15–41)
Albumin: 2.9 g/dL — ABNORMAL LOW (ref 3.5–5.0)
Alkaline Phosphatase: 68 U/L (ref 38–126)
Anion gap: 11 (ref 5–15)
BUN: 11 mg/dL (ref 8–23)
CHLORIDE: 99 mmol/L (ref 98–111)
CO2: 24 mmol/L (ref 22–32)
CREATININE: 0.88 mg/dL (ref 0.61–1.24)
Calcium: 8.5 mg/dL — ABNORMAL LOW (ref 8.9–10.3)
GFR calc Af Amer: >60 mL/min (ref >60)
GFR calc non Af Amer: >60 mL/min (ref >60)
Glucose, Bld: 129 mg/dL — ABNORMAL HIGH (ref 70–99)
Potassium: 3 mmol/L — ABNORMAL LOW (ref 3.5–5.1)
Sodium: 134 mmol/L — ABNORMAL LOW (ref 135–145)
Total Bilirubin: 0.7 mg/dL (ref 0.3–1.2)
Total Protein: 6.2 g/dL — ABNORMAL LOW (ref 6.5–8.1)

## 2018-01-21 LAB — PHOSPHORUS: Phosphorus: 3 mg/dL (ref 2.5–4.6)

## 2018-01-21 LAB — TSH: TSH: 1.149 u[IU]/mL (ref 0.350–4.500)

## 2018-01-21 MED ORDER — DILTIAZEM HCL 100 MG IV SOLR
INTRAVENOUS | Status: AC
  Filled 2018-01-21: qty 100

## 2018-01-21 MED ORDER — SODIUM CHLORIDE 0.9 % IV SOLN
Freq: Once | INTRAVENOUS | Status: AC
  Administered 2018-01-21: 16:00:00 via INTRAVENOUS

## 2018-01-21 MED ORDER — DILTIAZEM HCL 100 MG IV SOLR
5.0000 mg/h | INTRAVENOUS | Status: DC
  Administered 2018-01-21: 5 mg/h via INTRAVENOUS

## 2018-01-21 MED ORDER — ADENOSINE 6 MG/2ML IV SOLN
INTRAVENOUS | Status: AC
  Administered 2018-01-21: 6 mg via INTRAVENOUS
  Filled 2018-01-21: qty 6

## 2018-01-21 MED ORDER — METOPROLOL TARTRATE 5 MG/5ML IV SOLN
INTRAVENOUS | Status: AC
  Filled 2018-01-21: qty 5

## 2018-01-21 MED ORDER — DILTIAZEM LOAD VIA INFUSION
15.0000 mg | Freq: Once | INTRAVENOUS | Status: AC
  Administered 2018-01-21: 15 mg via INTRAVENOUS
  Filled 2018-01-21: qty 15

## 2018-01-21 MED ORDER — POTASSIUM CHLORIDE CRYS ER 20 MEQ PO TBCR
40.0000 meq | EXTENDED_RELEASE_TABLET | Freq: Once | ORAL | Status: AC
  Administered 2018-01-21: 40 meq via ORAL
  Filled 2018-01-21: qty 2

## 2018-01-21 MED ORDER — METOPROLOL TARTRATE 5 MG/5ML IV SOLN
2.5000 mg | Freq: Once | INTRAVENOUS | Status: AC
  Administered 2018-01-21: 2.5 mg via INTRAVENOUS

## 2018-01-21 MED ORDER — METOPROLOL TARTRATE 5 MG/5ML IV SOLN
5.0000 mg | Freq: Once | INTRAVENOUS | Status: AC
  Administered 2018-01-21: 5 mg via INTRAVENOUS

## 2018-01-21 MED ORDER — POTASSIUM CHLORIDE 10 MEQ/100ML IV SOLN
10.0000 meq | INTRAVENOUS | Status: AC
  Administered 2018-01-21 (×2): 10 meq via INTRAVENOUS
  Filled 2018-01-21 (×2): qty 100

## 2018-01-21 MED ORDER — SODIUM CHLORIDE 0.9 % IV BOLUS
500.0000 mL | Freq: Once | INTRAVENOUS | Status: AC
  Administered 2018-01-21: 500 mL via INTRAVENOUS

## 2018-01-21 MED ORDER — METOPROLOL TARTRATE 5 MG/5ML IV SOLN: 10.0000 mg | Freq: Once | INTRAVENOUS | Status: DC

## 2018-01-21 MED ORDER — METOPROLOL TARTRATE 50 MG PO TABS
50.0000 mg | ORAL_TABLET | Freq: Once | ORAL | Status: AC
  Administered 2018-01-21: 50 mg via ORAL
  Filled 2018-01-21: qty 1

## 2018-01-21 MED ORDER — METOPROLOL SUCCINATE ER 25 MG PO TB24: 50.0000 mg | ORAL_TABLET | Freq: Every day | ORAL | 0 refills | Status: DC

## 2018-01-21 MED ORDER — SODIUM CHLORIDE 0.9 % IV BOLUS: 1000.0000 mL | Freq: Once | INTRAVENOUS | Status: DC

## 2018-01-21 MED ORDER — HEPARIN SOD (PORK) LOCK FLUSH 100 UNIT/ML IV SOLN
INTRAVENOUS | Status: AC
  Administered 2018-01-21: 500 [IU]
  Filled 2018-01-21: qty 5

## 2018-01-21 MED ORDER — METOPROLOL TARTRATE 5 MG/5ML IV SOLN
5.0000 mg | Freq: Once | INTRAVENOUS | Status: AC
  Administered 2018-01-21: 5 mg via INTRAVENOUS
  Filled 2018-01-21: qty 5

## 2018-01-21 MED ORDER — SODIUM CHLORIDE 0.9 % IV BOLUS
1000.0000 mL | Freq: Once | INTRAVENOUS | Status: AC
  Administered 2018-01-21: 1000 mL via INTRAVENOUS

## 2018-01-21 MED ORDER — METOPROLOL TARTRATE 5 MG/5ML IV SOLN
7.5000 mg | Freq: Once | INTRAVENOUS | Status: DC
  Filled 2018-01-21: qty 10

## 2018-01-21 MED ORDER — ADENOSINE 6 MG/2ML IV SOLN
12.0000 mg | Freq: Once | INTRAVENOUS | Status: AC
  Administered 2018-01-21: 6 mg via INTRAVENOUS

## 2018-01-21 NOTE — Progress Notes (Signed)
Patient ambulatory for iv fluids today. Weak, coughing and pulse 172 today. Per dr Maylon Peppers, patient sent to E.D. for evaluation

## 2018-01-21 NOTE — ED Provider Notes (Signed)
[facility] EMERGENCY DEPARTMENT Provider Note   CSN: [id number] Arrival date & time: 01/21/18  1153     History   Chief Complaint Chief Complaint  Patient presents with  . Tachycardia    HPI Bradley Novak is a 68 y.o. male.  HPI Patient is a 68 year old male with no known coronary artery disease who presents to the emergency department from his oncology clinic with a heart rate in the 170s found to be in SVT/AVNRT.  He reports palpitations usually after his dose of chemotherapy but today his palpitations had persisted.  He has been receiving IV fluids for decreased oral intake.  No history of A. fib or SVT.  Recently taken off of his Coreg because of a borderline blood pressure in the setting of decreased oral intake by his oncology team.   Past Medical History:  Diagnosis Date  . Cancer ([place])   . Hypertension 2006  . Obesity   . Wears glasses     Patient Active Problem List   Diagnosis Date Noted  . Hypomagnesemia 01/20/2018  . Anemia due to antineoplastic chemotherapy 01/20/2018  . Malnutrition ([place]) 01/14/2018  . Hypokalemia 01/14/2018  . Insomnia 12/23/2017  . Dysuria 12/10/2017  . Chemotherapy induced neutropenia ([place]) 12/10/2017  . Cramp, abdominal 12/09/2017  . Alkaline phosphatase elevation 09/19/2017  . Pneumonia due to infectious organism 09/06/2017  . Fatigue 09/06/2017  . Hodgkin lymphoma ([place]) 08/29/2017  . Hoarseness 08/29/2017  . Cough 08/29/2017  . Screening for prostate cancer 08/29/2017  . Essential hypertension 03/08/2014  . Morbid obesity ([place]) 03/08/2014    Past Surgical History:  Procedure Laterality Date  . IR IMAGING GUIDED PORT INSERTION  11/20/2017        Home Medications    Prior to Admission medications   Medication Sig Start Date End Date Taking? Authorizing Provider  amoxicillin-clavulanate (AUGMENTIN) 875-125 MG tablet Take 1 tablet by mouth 2 (two) times daily for 7 days. 01/17/18 01/24/18  [name], MD    aspirin EC 81 MG tablet Take 1 tablet (81 mg total) by mouth daily. 08/30/17   Tysinger, Camelia Eng, PA-C  atorvastatin (LIPITOR) 20 MG tablet TAKE 1 TABLET BY MOUTH DAILY 09/02/17   Tysinger, Camelia Eng, PA-C  carvedilol (COREG) 6.25 MG tablet Take 1 tablet (6.25 mg total) by mouth 2 (two) times daily. 11/11/17   Nahser, Wonda Cheng, MD  Cholecalciferol (VITAMIN D PO) Take 1 capsule by mouth at bedtime.    [provider]  dexamethasone (DECADRON) 4 MG tablet Take 2 tablets by mouth once a day on the day after chemotherapy and then take 2 tablets two times a day for 2 days. Take with food. 11/25/17   [name], MD  diclofenac (VOLTAREN) 75 MG EC tablet Take 75 mg by mouth daily.    [provider]  ibuprofen (ADVIL,MOTRIN) 200 MG tablet Take 400 mg by mouth daily as needed for moderate pain.    [provider]  lactulose (CHRONULAC) 10 GM/15ML solution Take 15 mLs (10 g total) by mouth 2 (two) times daily as needed for up to 14 days for mild constipation. 01/14/18 01/28/18  [name], MD  leucovorin (WELLCOVORIN) 15 MG tablet Take 1 tablet (15 mg total) by mouth daily. 01/20/18 02/19/18  [name], MD  lidocaine-prilocaine (EMLA) cream Apply to affected area once Patient not taking: Reported on 01/14/2018 11/25/17   [name], MD  LORazepam (ATIVAN) 0.5 MG tablet Take 1 tablet (0.5 mg total) by mouth every  6 (six) hours as needed (Nausea or vomiting). 11/25/17   [name], MD  losartan (COZAAR) 100 MG tablet Take 1 tablet (100 mg total) by mouth daily. 11/11/17   Nahser, Wonda Cheng, MD  magnesium oxide (MAG-OX) 400 (241.3 Mg) MG tablet Take 1 tablet (400 mg total) by mouth 2 (two) times daily. 01/17/18 02/16/18  [name], MD  metoCLOPramide (REGLAN) 10 MG tablet Take 1 tablet (10 mg total) by mouth 3 (three) times daily before meals for 14 days. 01/14/18 01/28/18  [name], MD  Morphine Sulfate (MORPHINE CONCENTRATE) 10 mg / 0.5 ml concentrated solution Take 0.5 mLs (10 mg total) by mouth  every 4 (four) hours as needed for up to 14 days for severe pain. 01/14/18 01/28/18  [name], MD  ondansetron (ZOFRAN) 8 MG tablet Take 1 tablet (8 mg total) by mouth 2 (two) times daily as needed. Start on the third day after chemotherapy. 11/25/17   [name], MD  polyethylene glycol [facility]) packet Take 17 g by mouth 2 (two) times daily. 01/14/18 02/13/18  [name], MD  potassium chloride SA (K-DUR,KLOR-CON) 20 MEQ tablet Take 1 tablet (20 mEq total) by mouth 2 (two) times daily. 01/20/18 04/20/18  [name], MD  prochlorperazine (COMPAZINE) 10 MG tablet Take 1 tablet (10 mg total) by mouth every 6 (six) hours as needed (Nausea or vomiting). 11/25/17   [name], MD  ranitidine (ZANTAC) 150 MG tablet Take 150 mg by mouth daily as needed for heartburn.    [provider]  senna-docusate (SENOKOT-S) 8.6-50 MG tablet Take 1 tablet by mouth 2 (two) times daily. 01/20/18 02/19/18  [name], MD    Family History Family History  Problem Relation Age of Onset  . Hypertension Father   . Hypertension Brother   . Hypertension Brother   . Hypertension Brother   . Hypertension Brother     Social History Social History   Tobacco Use  . Smoking status: Never Smoker  . Smokeless tobacco: Never Used  Substance Use Topics  . Alcohol use: No    Alcohol/week: 0.0 standard drinks  . Drug use: No     Allergies   Patient has no known allergies.   Review of Systems Review of Systems  All other systems reviewed and are negative.    Physical Exam Updated Vital Signs BP 135/89   Pulse 74   Temp 98.8 F (37.1 C) (Oral)   Resp 20   Ht 5\' 11"  (1.803 m)   Wt 112 kg   SpO2 98%   BMI 34.44 kg/m   Physical Exam Vitals signs and nursing note reviewed.  Constitutional:      Appearance: He is well-developed.  HENT:     Head: Normocephalic and atraumatic.  Neck:     Musculoskeletal: Normal range of motion.  Cardiovascular:     Rate and Rhythm: Regular rhythm. Tachycardia present.      Heart sounds: Normal heart sounds.  Pulmonary:     Effort: Pulmonary effort is normal. No respiratory distress.     Breath sounds: Normal breath sounds.  Abdominal:     General: There is no distension.     Palpations: Abdomen is soft.     Tenderness: There is no abdominal tenderness.  Musculoskeletal: Normal range of motion.  Skin:    General: Skin is warm and dry.  Neurological:     Mental Status: He is alert and oriented to person, place, and time.  Psychiatric:  Judgment: Judgment normal.      ED Treatments / Results  Labs (all labs ordered are listed, but only abnormal results are displayed) Labs Reviewed  CBC WITH DIFFERENTIAL/PLATELET - Abnormal; Notable for the following components:      Result Value   RBC 3.66 (*)    Hemoglobin 10.1 (*)    HCT 31.4 (*)    nRBC 1.7 (*)    Monocytes Absolute 1.4 (*)    Abs Immature Granulocytes 0.64 (*)    All other components within normal limits  COMPREHENSIVE METABOLIC PANEL - Abnormal; Notable for the following components:   Sodium 134 (*)    Potassium 3.0 (*)    Glucose, Bld 129 (*)    Calcium 8.5 (*)    Total Protein 6.2 (*)    Albumin 2.9 (*)    All other components within normal limits  MAGNESIUM  PHOSPHORUS  TSH  PATHOLOGIST SMEAR REVIEW    EKG EKG Interpretation #1  Date/Time:  Tuesday January 21 2018 12:02:34 EST Ventricular Rate:  181 PR Interval:    QRS Duration: 92 QT Interval:  218 QTC Calculation: 378 R Axis:   -20 Text Interpretation:  Supraventricular tachycardia Cannot rule out Anterior infarct , age undetermined Marked ST abnormality, possible inferolateral subendocardial injury Abnormal ECG No old tracing to compare Confirmed by Jola Schmidt [phone]) on 01/21/2018 1:10:49 PM    EKG Interpretation #2  Date/Time:  Tuesday January 21 2018 12:58:33 EST Ventricular Rate:  76 PR Interval:    QRS Duration: 118 QT Interval:  395 QTC Calculation: 445 R Axis:   -18 Text Interpretation:  Sinus  rhythm Nonspecific intraventricular conduction delay Low voltage, precordial leads SVT resolved.  Confirmed by Jola Schmidt [phone]) on 01/21/2018 1:11:41 PM        Radiology Dg Chest Portable 1 View  Result Date: 01/21/2018 CLINICAL DATA:  Chest pain. Tachycardia. History of lymphoma. EXAM: PORTABLE CHEST 1 VIEW COMPARISON:  Chest radiographs 08/29/2017. PET-CT 01/10/2018. FINDINGS: A left jugular Port-A-Cath terminates near the superior cavoatrial junction. The cardiac silhouette is normal in size. Lung volumes are low with increased interstitial markings diffusely throughout both lungs. No sizable pleural effusion or pneumothorax is identified. No acute osseous abnormality is seen. IMPRESSION: Low lung volumes with increased interstitial markings throughout both lungs, which could reflect atypical infection or edema. Electronically Signed   By: Logan Bores M.D.   On: 01/21/2018 13:12    Procedures  .Critical Care Performed by: Jola Schmidt, MD Authorized by: Jola Schmidt, MD   Critical care provider statement:    Critical care time (minutes):  32   Critical care was time spent personally by me on the following activities:  Discussions with consultants, evaluation of patient's response to treatment, examination of patient, ordering and performing treatments and interventions, ordering and review of laboratory studies, ordering and review of radiographic studies, pulse oximetry, re-evaluation of patient's condition, obtaining history from patient or surrogate and review of old charts   (including critical care time)  Medications Ordered in ED Medications  potassium chloride 10 mEq in 100 mL IVPB (10 mEq Intravenous New Bag/Given 01/21/18 1529)  diltiazem (CARDIZEM) 1 mg/mL load via infusion 15 mg (15 mg Intravenous Bolus from Bag 01/21/18 1532)    And  diltiazem (CARDIZEM) 100 mg in dextrose 5 % 100 mL (1 mg/mL) infusion (5 mg/hr Intravenous New Bag/Given 01/21/18 1530)  metoprolol tartrate  (LOPRESSOR) injection 5 mg (5 mg Intravenous Given 01/21/18 1232)  adenosine (ADENOCARD) 6 MG/2ML injection 12  mg (12 mg Intravenous Not Given 01/21/18 1234)  metoprolol tartrate (LOPRESSOR) injection 2.5 mg (2.5 mg Intravenous Given 01/21/18 1239)  sodium chloride 0.9 % bolus 1,000 mL (0 mLs Intravenous Stopped 01/21/18 1343)  metoprolol tartrate (LOPRESSOR) injection 5 mg (5 mg Intravenous Given 01/21/18 1258)  metoprolol tartrate (LOPRESSOR) injection 5 mg (5 mg Intravenous Given 01/21/18 1416)  metoprolol tartrate (LOPRESSOR) tablet 50 mg (50 mg Oral Given 01/21/18 1414)  potassium chloride SA (K-DUR,KLOR-CON) CR tablet 40 mEq (40 mEq Oral Given 01/21/18 1414)  sodium chloride 0.9 % bolus 500 mL (0 mLs Intravenous Stopped 01/21/18 1530)  diltiazem (CARDIZEM) 100 MG injection (  Return to [facility] 01/21/18 1530)  0.9 %  sodium chloride infusion ( Intravenous New Bag/Given 01/21/18 1538)     Initial Impression / Assessment and Plan / ED Course  I have reviewed the triage vital signs and the nursing notes.  Pertinent labs & imaging results that were available during my care of the patient were reviewed by me and considered in my medical decision making (see chart for details).     Patient converted with IV adenosine however he quickly went back into a narrow complex tachycardia again.  Multiple doses of IV Lopressor been given which have resulted in resolution of his tachycardia but then he will develop narrow complex SVT/AVNRT again.  He is hypokalemic.  Potassium is being replaced at this time.  Oral beta-blockers have been added on.  Despite this he returned to a rapid narrow complex tachycardia and thus IV Cardizem will be given.  Will continue to observe the patient in the emergency department.  If he fails IV Cardizem at that point will likely need admission to the hospital and cardiology consultation for complex arrhythmia not responding to standard medications in the emergency department.  Hopefully with  replacement of his potassium and hydration this will improve.  Patient's care has been transferred to Dr. Tyrone Nine who will continue his work-up in the emergency department and attempting to control his narrow complex tachycardia.  If we are able to control here in the emergency department he will need close follow-up with his cardiology team and likely be discharged home on oral beta-blockers.  If he continues to develop narrow complex tachycardia while in the emergency department he will need cardiology consultation and hospitalist admission.  Family is agreeable to ongoing treatment in the emergency department and attempts to improve his situation so that he can safely be discharged home.  Final Clinical Impressions(s) / ED Diagnoses   Final diagnoses:  None    ED Discharge Orders    None       Jola Schmidt, MD 01/21/18 [phone]

## 2018-01-21 NOTE — ED Provider Notes (Signed)
I received the patient in signout from Dr. Venora Maples, briefly the patient is a history of Hodgkin's lymphoma undergoing chemotherapy.  He been having palpitations off and on but saw his doctor today and was noted to have a heart rate in the 180s.  Had transient improvement with adenosine, metoprolol.  Found to have mild hypo-kalemia and hypomagnesemia.  Plan is to replenish the patient's potassium and magnesium to give an oral dose of metoprolol and start him on Cardizem and evaluate for improvement.  The patient does maintain a rate in the 70s for over an hour after taking him off the Cardizem.  We will start him on 50 mg of metoprolol as that is what he was given orally here.  We will have him call his cardiologist Dr. Acie Fredrickson in the morning.   Deno Etienne, DO 01/21/18 1900

## 2018-01-21 NOTE — ED Notes (Signed)
Pt upstair at cancer  Had ekg done  Showing high heart rate  Pt denies any pain or symptoms

## 2018-01-21 NOTE — Telephone Encounter (Signed)
Please call and let him know I was thinking about him and praying for him.  I hope his cruise/vacation recently was wonderful.

## 2018-01-21 NOTE — ED Triage Notes (Signed)
Pt was at his oncologist appt when they found him to be in SVT. Denies pain.

## 2018-01-21 NOTE — Discharge Instructions (Signed)
Call your cardiologist in the morning.  Return for repeat event, chest pain, shortness of breath

## 2018-01-22 ENCOUNTER — Inpatient Hospital Stay: Payer: Medicare Other

## 2018-01-22 ENCOUNTER — Encounter: Payer: Self-pay | Admitting: Hematology

## 2018-01-22 ENCOUNTER — Inpatient Hospital Stay: Payer: Medicare Other | Admitting: Nutrition

## 2018-01-22 LAB — PATHOLOGIST SMEAR REVIEW

## 2018-01-23 ENCOUNTER — Other Ambulatory Visit (HOSPITAL_COMMUNITY): Payer: Medicare Other

## 2018-01-23 ENCOUNTER — Inpatient Hospital Stay: Payer: Medicare Other

## 2018-01-23 VITALS — BP 135/68 | HR 82 | Temp 98.2°F | Resp 20 | Wt 249.8 lb

## 2018-01-23 DIAGNOSIS — E46 Unspecified protein-calorie malnutrition: Secondary | ICD-10-CM | POA: Diagnosis not present

## 2018-01-23 DIAGNOSIS — E860 Dehydration: Secondary | ICD-10-CM | POA: Diagnosis not present

## 2018-01-23 DIAGNOSIS — C8190 Hodgkin lymphoma, unspecified, unspecified site: Secondary | ICD-10-CM

## 2018-01-23 DIAGNOSIS — I471 Supraventricular tachycardia: Secondary | ICD-10-CM | POA: Diagnosis not present

## 2018-01-23 DIAGNOSIS — Z5111 Encounter for antineoplastic chemotherapy: Secondary | ICD-10-CM | POA: Diagnosis not present

## 2018-01-23 DIAGNOSIS — C8198 Hodgkin lymphoma, unspecified, lymph nodes of multiple sites: Secondary | ICD-10-CM | POA: Diagnosis not present

## 2018-01-23 DIAGNOSIS — K5900 Constipation, unspecified: Secondary | ICD-10-CM | POA: Diagnosis not present

## 2018-01-23 MED ORDER — HEPARIN SOD (PORK) LOCK FLUSH 100 UNIT/ML IV SOLN
500.0000 [IU] | Freq: Once | INTRAVENOUS | Status: AC | PRN
  Administered 2018-01-23: 500 [IU]
  Filled 2018-01-23: qty 5

## 2018-01-23 MED ORDER — SODIUM CHLORIDE 0.9 % IV SOLN
Freq: Once | INTRAVENOUS | Status: AC
  Administered 2018-01-23: 12:00:00 via INTRAVENOUS
  Filled 2018-01-23: qty 250

## 2018-01-23 MED ORDER — SODIUM CHLORIDE 0.9% FLUSH
10.0000 mL | Freq: Once | INTRAVENOUS | Status: AC | PRN
  Administered 2018-01-23: 10 mL
  Filled 2018-01-23: qty 10

## 2018-01-24 ENCOUNTER — Encounter (HOSPITAL_COMMUNITY): Payer: Self-pay | Admitting: Emergency Medicine

## 2018-01-24 ENCOUNTER — Emergency Department (HOSPITAL_COMMUNITY)
Admission: EM | Admit: 2018-01-24 | Discharge: 2018-01-24 | Disposition: A | Discharge status: Home / Self Care | Payer: Medicare Other | Attending: Emergency Medicine | Admitting: Emergency Medicine

## 2018-01-24 ENCOUNTER — Ambulatory Visit (INDEPENDENT_AMBULATORY_CARE_PROVIDER_SITE_OTHER): Payer: Medicare Other | Admitting: Cardiovascular Disease

## 2018-01-24 ENCOUNTER — Other Ambulatory Visit: Payer: Self-pay

## 2018-01-24 ENCOUNTER — Encounter: Payer: Self-pay | Admitting: Cardiovascular Disease

## 2018-01-24 ENCOUNTER — Inpatient Hospital Stay: Payer: Medicare Other

## 2018-01-24 ENCOUNTER — Emergency Department (HOSPITAL_COMMUNITY): Payer: Medicare Other

## 2018-01-24 ENCOUNTER — Other Ambulatory Visit: Payer: Self-pay | Admitting: Hematology

## 2018-01-24 VITALS — BP 98/70 | HR 154 | Ht 71.0 in | Wt 254.0 lb

## 2018-01-24 DIAGNOSIS — I471 Supraventricular tachycardia: Secondary | ICD-10-CM | POA: Diagnosis not present

## 2018-01-24 DIAGNOSIS — Z7982 Long term (current) use of aspirin: Secondary | ICD-10-CM | POA: Diagnosis not present

## 2018-01-24 DIAGNOSIS — Z79899 Other long term (current) drug therapy: Secondary | ICD-10-CM | POA: Diagnosis not present

## 2018-01-24 DIAGNOSIS — R918 Other nonspecific abnormal finding of lung field: Secondary | ICD-10-CM | POA: Diagnosis not present

## 2018-01-24 DIAGNOSIS — I10 Essential (primary) hypertension: Secondary | ICD-10-CM | POA: Insufficient documentation

## 2018-01-24 LAB — CBC WITH DIFFERENTIAL/PLATELET
Abs Immature Granulocytes: 1.02 10*3/uL — ABNORMAL HIGH (ref 0.00–0.07)
Basophils Absolute: 0.1 10*3/uL (ref 0.0–0.1)
Basophils Relative: 1 %
Eosinophils Absolute: 0.1 10*3/uL (ref 0.0–0.5)
Eosinophils Relative: 1 %
HCT: 28.9 % — ABNORMAL LOW (ref 39.0–52.0)
Hemoglobin: 9.3 g/dL — ABNORMAL LOW (ref 13.0–17.0)
IMMATURE GRANULOCYTES: 8 %
Lymphocytes Relative: 8 %
Lymphs Abs: 1.1 10*3/uL (ref 0.7–4.0)
MCH: 28.2 pg (ref 26.0–34.0)
MCHC: 32.2 g/dL (ref 30.0–36.0)
MCV: 87.6 fL (ref 80.0–100.0)
Monocytes Absolute: 1.3 10*3/uL — ABNORMAL HIGH (ref 0.1–1.0)
Monocytes Relative: 10 %
NEUTROS PCT: 72 %
Neutro Abs: 9.9 10*3/uL — ABNORMAL HIGH (ref 1.7–7.7)
Platelets: 352 10*3/uL (ref 150–400)
RBC: 3.3 MIL/uL — ABNORMAL LOW (ref 4.22–5.81)
RDW: 14.3 % (ref 11.5–15.5)
WBC: 13.4 10*3/uL — ABNORMAL HIGH (ref 4.0–10.5)
nRBC: 0.5 % — ABNORMAL HIGH (ref 0.0–0.2)

## 2018-01-24 LAB — BASIC METABOLIC PANEL
Anion gap: 8 (ref 5–15)
BUN: 11 mg/dL (ref 8–23)
CALCIUM: 8.5 mg/dL — AB (ref 8.9–10.3)
CO2: 23 mmol/L (ref 22–32)
Chloride: 106 mmol/L (ref 98–111)
Creatinine, Ser: 1.04 mg/dL (ref 0.61–1.24)
GFR calc Af Amer: >60 mL/min (ref >60)
GFR calc non Af Amer: >60 mL/min (ref >60)
Glucose, Bld: 119 mg/dL — ABNORMAL HIGH (ref 70–99)
Potassium: 3.4 mmol/L — ABNORMAL LOW (ref 3.5–5.1)
Sodium: 137 mmol/L (ref 135–145)

## 2018-01-24 LAB — MAGNESIUM: Magnesium: 1.9 mg/dL (ref 1.7–2.4)

## 2018-01-24 MED ORDER — HEPARIN SOD (PORK) LOCK FLUSH 100 UNIT/ML IV SOLN
500.0000 [IU] | Freq: Once | INTRAVENOUS | Status: AC
  Administered 2018-01-24: 500 [IU]
  Filled 2018-01-24 (×2): qty 5

## 2018-01-24 MED ORDER — SODIUM CHLORIDE 0.9 % IV SOLN: 1.0000 g | Freq: Once | INTRAVENOUS | Status: DC

## 2018-01-24 MED ORDER — METOPROLOL TARTRATE 50 MG PO TABS: 50.0000 mg | ORAL_TABLET | Freq: Two times a day (BID) | ORAL | 11 refills | Status: DC

## 2018-01-24 MED ORDER — ALBUTEROL SULFATE (2.5 MG/3ML) 0.083% IN NEBU: 2.5000 mg | INHALATION_SOLUTION | Freq: Once | RESPIRATORY_TRACT | Status: DC

## 2018-01-24 MED ORDER — DRONEDARONE HCL 400 MG PO TABS: 400.0000 mg | ORAL_TABLET | Freq: Two times a day (BID) | ORAL | 11 refills | Status: DC

## 2018-01-24 NOTE — Progress Notes (Addendum)
Cardiology Office Note:    Date:  01/24/2018   ID:  Bradley Novak, DOB 1950-05-13, MRN [id number]  PCP:  Bradley Hurl, PA-C  Cardiologist:  Bradley Moores, MD  Electrophysiologist:  None   Referring MD: Bradley Hurl, PA-C   Problem list 1.  Hypertension 2.  Coronary artery calcifications 3.  Hodgkin's lymphoma   Chief Complaint  Patient presents with  . Tachycardia         Bradley Novak is a 68 y.o. male with a hx of HTN   We were asked to see him today for further evaluation of his HTN by Bradley Novak  has a history of hypertension and hyperlipidemia. He had pneumonia this past year  Had a CT scan that showed Hodgkins lymphoma and coronary  Calcifications   Had had lymph node Bx   Has been on on Vasotec  And verapamil for years  No CP ,  No real exercise - active at work [facility] )  Has some shortness of breath   And fatigue over the past 3-4 months  Has lots of fatigue  Recent lipid levels from August, 2019 look great.  LDL is 70.  HDL is 44.  Total cholesterol is 131.  Triglyceride level is 85.  Non smoker Does not drink Eats fairly well   No wt gain or loss No hematura  no N,V,D No syncope or presyncope  Has the usual MSK pain   January 24, 2018: Bradley Novak is seen back today as an urgent work in visit.  He has been having problems with supraventricular tachycardia.  He was in the emergency room yesterday and received adenosine.  He is on metoprolol.  The adenosine converted him back to sinus rhythm but only for approximately 10 to 20 seconds. Took several doses of metoprolol last night and was able to get his heart rate down to 98  He is currently getting chemotherapy for Hodgkin's lymphoma. He was supposed to receive chemotherapy Tuesday  but did not get it because he was in the emergency room with an episode of supraventricular tachycardia.   He was previously on carvedilol 6.25 mg twice a day.  In the  emergency room on this past Tuesday he was given Toprol-XL 50 mg a day also  He converted into normal sinus rhythm for about 20 seconds.  They continue to give him IV metoprolol and IV fluids.  His oncologist is Dr. Maylon Novak.        Past Medical History:  Diagnosis Date  . Cancer ([place])   . Hypertension 2006  . Obesity   . Wears glasses     Past Surgical History:  Procedure Laterality Date  . IR IMAGING GUIDED PORT INSERTION  11/20/2017    Current Medications: Current Meds  Medication Sig  . amoxicillin-clavulanate (AUGMENTIN) 875-125 MG tablet Take 1 tablet by mouth 2 (two) times daily for 7 days.  Marland Kitchen aspirin EC 81 MG tablet Take 1 tablet (81 mg total) by mouth daily.  Marland Kitchen atorvastatin (LIPITOR) 20 MG tablet TAKE 1 TABLET BY MOUTH DAILY  . Cholecalciferol (VITAMIN D PO) Take 1 capsule by mouth at bedtime.  Marland Kitchen dexamethasone (DECADRON) 4 MG tablet Take 2 tablets by mouth once a day on the day after chemotherapy and then take 2 tablets two times a day for 2 days. Take with food.  . diclofenac (VOLTAREN) 75 MG EC tablet Take 75 mg by mouth daily.  Marland Kitchen ibuprofen (ADVIL,MOTRIN)  200 MG tablet Take 400 mg by mouth daily as needed for moderate pain.  Marland Kitchen lactulose (CHRONULAC) 10 GM/15ML solution Take 15 mLs (10 g total) by mouth 2 (two) times daily as needed for up to 14 days for mild constipation.  Marland Kitchen leucovorin (WELLCOVORIN) 15 MG tablet Take 1 tablet (15 mg total) by mouth daily.  Marland Kitchen LORazepam (ATIVAN) 0.5 MG tablet Take 1 tablet (0.5 mg total) by mouth every 6 (six) hours as needed (Nausea or vomiting).  . magnesium oxide (MAG-OX) 400 (241.3 Mg) MG tablet Take 1 tablet (400 mg total) by mouth 2 (two) times daily.  . metoCLOPramide (REGLAN) 10 MG tablet Take 1 tablet (10 mg total) by mouth 3 (three) times daily before meals for 14 days.  . Morphine Sulfate (MORPHINE CONCENTRATE) 10 mg / 0.5 ml concentrated solution Take 0.5 mLs (10 mg total) by mouth every 4 (four) hours as needed for up to 14 days  for severe pain.  Marland Kitchen ondansetron (ZOFRAN) 8 MG tablet Take 1 tablet (8 mg total) by mouth 2 (two) times daily as needed. Start on the third day after chemotherapy.  . polyethylene glycol (MIRALAX) packet Take 17 g by mouth 2 (two) times daily.  . potassium chloride SA (K-DUR,KLOR-CON) 20 MEQ tablet Take 1 tablet (20 mEq total) by mouth 2 (two) times daily.  . prochlorperazine (COMPAZINE) 10 MG tablet Take 1 tablet (10 mg total) by mouth every 6 (six) hours as needed (Nausea or vomiting).  . ranitidine (ZANTAC) 150 MG tablet Take 150 mg by mouth daily as needed for heartburn.  . [DISCONTINUED] carvedilol (COREG) 6.25 MG tablet Take 1 tablet (6.25 mg total) by mouth 2 (two) times daily.  . [DISCONTINUED] losartan (COZAAR) 100 MG tablet Take 1 tablet (100 mg total) by mouth daily.  . [DISCONTINUED] metoprolol succinate (TOPROL-XL) 25 MG 24 hr tablet Take 2 tablets (50 mg total) by mouth daily.     Allergies:   Patient has no known allergies.   Social History   Socioeconomic History  . Marital status: Married    Spouse name: Not on file  . Number of children: Not on file  . Years of education: Not on file  . Highest education level: Not on file  Occupational History  . Not on file  Social Needs  . Financial resource strain: Not on file  . Food insecurity:    Worry: Not on file    Inability: Not on file  . Transportation needs:    Medical: Not on file    Non-medical: Not on file  Tobacco Use  . Smoking status: Never Smoker  . Smokeless tobacco: Never Used  Substance and Sexual Activity  . Alcohol use: No    Alcohol/week: 0.0 standard drinks  . Drug use: No  . Sexual activity: Not Currently  Lifestyle  . Physical activity:    Days per week: Not on file    Minutes per session: Not on file  . Stress: Not on file  Relationships  . Social connections:    Talks on phone: Not on file    Gets together: Not on file    Attends religious service: Not on file    Active member of club  or organization: Not on file    Attends meetings of clubs or organizations: Not on file    Relationship status: Not on file  Other Topics Concern  . 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  ROS:   Please see the history of present illness.     All other systems reviewed and are negative.  EKGs/Labs/Other Studies Reviewed:    The following studies were reviewed today:   EKG: August, 2019: Sinus bradycardia.  No ST or T wave changes.  Incomplete right bundle branch block.  Recent Labs: 01/21/2018: ALT 33; BUN 11; Creatinine, Ser 0.88; Hemoglobin 10.1; Magnesium 1.9; Platelets 381; Potassium 3.0; Sodium 134; TSH 1.149  Recent Lipid Panel    Component Value Date/Time   CHOL 131 08/29/2017 1155   TRIG 85 08/29/2017 1155   HDL 44 08/29/2017 1155   CHOLHDL 3.0 08/29/2017 1155   CHOLHDL 5.1 (H) 09/27/2014 0001   VLDL 37 (H) 09/27/2014 0001   LDLCALC 70 08/29/2017 1155    Physical Exam:    VS:  BP 98/70   Pulse (!) 154   Ht 5\' 11"  (1.803 m)   Wt 254 lb (115.2 kg)   SpO2 96%   BMI 35.43 kg/m     Wt Readings from Last 3 Encounters:  01/24/18 254 lb (115.2 kg)  01/23/18 249 lb 12 oz (113.3 kg)  01/21/18 246 lb 14.6 oz (112 kg)     GEN:   Moderately obese, middle-aged gentleman no acute distress HEENT: Normal NECK: No JVD; No carotid bruits LYMPHATICS: No lymphadenopathy CARDIAC: RRR, no murmurs, rubs, gallops RESPIRATORY:  Clear to auscultation without rales, wheezing or rhonchi  ABDOMEN: Soft, non-tender, non-distended MUSCULOSKELETAL:  No edema; No deformity  SKIN: Warm and dry NEUROLOGIC:  Alert and oriented x 3 PSYCHIATRIC:  Normal affect   ASSESSMENT:    1. SVT (supraventricular  tachycardia) (HCC)    PLAN:     1.  Supraventricular tachycardia: The patient presents with recurrent/persistent supraventricular tachycardia.  He was originally diagnosed on January 7.  He originally converted with adenosine but has had recurrent episodes of fast heart rate.  We attempted carotid sinus massage in the office but he did not convert back to sinus rhythm.  Will be difficult to keep him out of SVT-especially when he receives chemotherapy.  I think that he needs to be admitted to the hospital.   Ive discussed with Dr. Caryl Comes.   We will start him on Multitak 400 mg twice a day.  We will continue metoprolol 50 mg twice a day.  We will stop the losartan temporarily to allow his blood pressure to increase slightly.   I would like to have him have an SVT ablation on Thursday. He will need to EP  consult today.  We will observe him overnight to make sure that he does not have any recurrent episodes of SVT and then he should be safe to go home over the weekend and wait for his ablation early this week.  Apparently there is a contraindication between Multitak and doxorubicin. My hope is that he will only be on Multitak for 1 week to help control his SVT while we get this ablation done. Be on doxorubicin and other chemotherapy agents for several months I suspect.  He is hypotensive at present.  We will need to hold the losartan for now.  In addition, we will discontinue the carvedilol and increase his metoprolol slightly.   Coronary artery calcifications:    2.  Hypertension:    Medication Adjustments/Labs and Tests Ordered: Current medicines are reviewed at length with the patient today.  Concerns regarding medicines are outlined above.  Orders Placed This Encounter  Procedures  . EKG 12-Lead   Meds ordered this encounter  Medications  . dronedarone (MULTAQ) 400 MG tablet    Sig: Take 1 tablet (400 mg total) by mouth 2 (two) times daily with a meal.    Dispense:  60 tablet      Refill:  11  . metoprolol tartrate (LOPRESSOR) 50 MG tablet    Sig: Take 1 tablet (50 mg total) by mouth 2 (two) times daily.    Dispense:  60 tablet    Refill:  11     Patient Instructions  Medication Instructions:  Your physician has recommended you make the following change in your medication:  STOP Metoprolol Succinate (Toprol) STOP Coreg (Carvedilol) STOP Losartan START Multaq (Dronedarone) 400 mg twice daily START Metoprolol Tartrate (Lopressor) 50 mg twice daily   If you need a refill on your cardiac medications before your next appointment, please call your pharmacy.    Lab work: None Ordered    Testing/Procedures: None Ordered    Follow-Up: Go straight to the [facility]  Your physician recommends that you schedule a follow-up appointment in: 2 months with Dr. Dahlia Client will schedule this for you     Signed, Bradley Moores, MD  01/24/2018 11:18 AM    [place]

## 2018-01-24 NOTE — Discharge Instructions (Signed)
Follow-up with cardiology as planned.  Return to ER as needed.  You are scheduled for your ablation procedure on Wed January 29, 2018 at 1;30PM Do not eat anything after midnight the night prior [place] Monday 01/27/2018 MORNING DOSE  [place] [facility], Dr. Macky Lower nurse will call you Monday with further instructions

## 2018-01-24 NOTE — ED Notes (Signed)
Pt verbalized understanding of discharge paperwork, follow-up care and medications.

## 2018-01-24 NOTE — Consult Note (Addendum)
Cardiology Consultation:   Patient ID: Bradley Novak MRN: [id number]; DOB: 10/01/1950  Admit date: 01/24/2018 Date of Consult: 01/24/2018  Primary Care Provider: Carlena Hurl, PA-C Primary Cardiologist: Mertie Moores, MD  Primary Electrophysiologist:  None  Oncology, Dr. Maylon Peppers   Patient Profile:   Bradley Novak is a 68 y.o. male with a hx of HTN, Hodkin Lymphoma, and as of earlier this week SVT who is being seen today for the evaluation of recurrent SVT at the request of Dr. Acie Fredrickson.  History of Present Illness:   Bradley Novak is getting chemotherapy for Hodgkin's lymphoma reportedly has palpitations with his treatments or afterwards that usually self resolved.  On 01/21/2018 the palpitations persisted and referred to the ER with HR 180's.  His ER note mentions that his oncologist had recentl stopped his Coreg 2/2 borderline BP with poor oral intake/nutrition. He was found in an SVT given adenosine 6 and 12 with only brief SR, a number of doses of lopressor IV followed, he was given K+ replacement for a K+ of 3.0 and Mag for a level 1.9 and IVF followed by IV dilt.  He seemed to have finally broke, maintaining SR discharged from the ER on Toprol 50mg  daily to f/u with Dr. Acie Fredrickson   He saw Dr. Acie Fredrickson today as a work-in for recurrent SVT, he reports the patient tokd a cumber of additional doses of Toprol at home in effort and finally did slow his HR.  His note reports BP 98/70 and HR 154 (EKG is unavailable in Epic).  In d/w Dr. Caryl Comes, referred to the ER for acute management of his SVT with thoughts to admit for observation, recommended to start multaq 400mg  BID and increase the metoprolol to 50mg  BID, (likely  need to hold his losartan).  The patient is on Doxirubicin, Dr. Acie Fredrickson mentions contraindication with the Multaq, though hoping multaq would be short term to an ablation next week. Noted that he has been requiring daily IVF for support with very poor nutritional intake/status  with his chemotherapy.   LABS K+ 3.4 Mag 1.9 BUN/Creat 11/1.04 WBC 13.4  H/H 9.3/28.9 Plts 352  01/21/2018 TSH was 1.149  The  Patient reports getting samples of the multaq at the office and given a dose while there,  By the time he arrived here he was back in [place].  He feels well, was aware immediately that it stopped.  He has not had any CP, the SVT makes him feel winded and weak.  He has not had syncope.   Past Medical History:  Diagnosis Date  . Cancer ([place])   . Hypertension 2006  . Obesity   . Wears glasses     Past Surgical History:  Procedure Laterality Date  . IR IMAGING GUIDED PORT INSERTION  11/20/2017     Home Medications:  Prior to Admission medications   Medication Sig Start Date End Date Taking? Authorizing Provider  amoxicillin-clavulanate (AUGMENTIN) 875-125 MG tablet Take 1 tablet by mouth 2 (two) times daily for 7 days. 01/17/18 01/24/18 Yes [name], MD  aspirin EC 81 MG tablet Take 1 tablet (81 mg total) by mouth daily. 08/30/17  Yes Tysinger, Camelia Eng, PA-C  atorvastatin (LIPITOR) 20 MG tablet TAKE 1 TABLET BY MOUTH DAILY Patient taking differently: Take 20 mg by mouth daily.  09/02/17  Yes Tysinger, Camelia Eng, PA-C  Cholecalciferol (VITAMIN D PO) Take 1 capsule by mouth at bedtime.   Yes [provider]  dexamethasone (DECADRON) 4 MG tablet Take  2 tablets by mouth once a day on the day after chemotherapy and then take 2 tablets two times a day for 2 days. Take with food. 11/25/17  Yes [name], MD  diclofenac (VOLTAREN) 75 MG EC tablet Take 75 mg by mouth daily.   Yes [provider]  dronedarone (MULTAQ) 400 MG tablet Take 1 tablet (400 mg total) by mouth 2 (two) times daily with a meal. 01/24/18  Yes Nahser, Wonda Cheng, MD  ibuprofen (ADVIL,MOTRIN) 200 MG tablet Take 400 mg by mouth daily as needed for moderate pain.   Yes [provider]  lactulose (CHRONULAC) 10 GM/15ML solution Take 15 mLs (10 g total) by mouth 2 (two) times daily as  needed for up to 14 days for mild constipation. 01/14/18 01/28/18 Yes [name], MD  leucovorin (WELLCOVORIN) 15 MG tablet Take 1 tablet (15 mg total) by mouth daily. 01/20/18 02/19/18 Yes [name], MD  LORazepam (ATIVAN) 0.5 MG tablet Take 1 tablet (0.5 mg total) by mouth every 6 (six) hours as needed (Nausea or vomiting). 11/25/17  Yes [name], MD  magnesium oxide (MAG-OX) 400 (241.3 Mg) MG tablet Take 1 tablet (400 mg total) by mouth 2 (two) times daily. 01/17/18 02/16/18 Yes [name], MD  metoCLOPramide (REGLAN) 10 MG tablet Take 1 tablet (10 mg total) by mouth 3 (three) times daily before meals for 14 days. 01/14/18 01/28/18 Yes [name], MD  metoprolol tartrate (LOPRESSOR) 50 MG tablet Take 1 tablet (50 mg total) by mouth 2 (two) times daily. 01/24/18  Yes Nahser, Wonda Cheng, MD  Morphine Sulfate (MORPHINE CONCENTRATE) 10 mg / 0.5 ml concentrated solution Take 0.5 mLs (10 mg total) by mouth every 4 (four) hours as needed for up to 14 days for severe pain. 01/14/18 01/28/18 Yes [name], MD  ondansetron (ZOFRAN) 8 MG tablet Take 1 tablet (8 mg total) by mouth 2 (two) times daily as needed. Start on the third day after chemotherapy. 11/25/17  Yes [name], MD  polyethylene glycol [facility]) packet Take 17 g by mouth 2 (two) times daily. 01/14/18 02/13/18 Yes [name], MD  potassium chloride SA (K-DUR,KLOR-CON) 20 MEQ tablet Take 1 tablet (20 mEq total) by mouth 2 (two) times daily. 01/20/18 04/20/18 Yes [name], MD  prochlorperazine (COMPAZINE) 10 MG tablet Take 1 tablet (10 mg total) by mouth every 6 (six) hours as needed (Nausea or vomiting). 11/25/17  Yes [name], MD  ranitidine (ZANTAC) 150 MG tablet Take 150 mg by mouth daily as needed for heartburn.   Yes [provider]  vitamin B-12 (CYANOCOBALAMIN) 100 MCG tablet Take 100 mcg by mouth daily.   Yes [provider]    Inpatient Medications: Scheduled Meds:  Continuous Infusions:  PRN Meds:   Allergies:   No Known  Allergies  Social History:   Social History   Socioeconomic History  . Marital status: Married    Spouse name: Not on file  . Number of children: Not on file  . Years of education: Not on file  . Highest education level: Not on file  Occupational History  . Not on file  Social Needs  . Financial resource strain: Not on file  . Food insecurity:    Worry: Not on file    Inability: Not on file  . Transportation needs:    Medical: Not on file    Non-medical: Not on file  Tobacco Use  . Smoking status: Never Smoker  . Smokeless tobacco: Never Used  Substance and Sexual Activity  . Alcohol use: No    Alcohol/week: 0.0 standard drinks  . Drug use: No  . Sexual activity: Not Currently  Lifestyle  . Physical activity:    Days per week: Not on file    Minutes per session: Not on file  . Stress: Not on file  Relationships  . Social connections:    Talks on phone: Not on file    Gets together: Not on file    Attends religious service: Not on file    Active member of club or organization: Not on file    Attends meetings of clubs or organizations: Not on file    Relationship status: Not on file  . Intimate partner violence:    Fear of current or ex partner: Not on file    Emotionally abused: Not on file    Physically abused: Not on file    Forced sexual activity: Not on file  Other Topics Concern  . 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 Hypertension Father   . Hypertension Brother   . Hypertension Brother   . Hypertension Brother   . Hypertension Brother      ROS:  Please see the history of present illness.   All other ROS reviewed and negative.     Physical Exam/Data:   Vitals:   01/24/18 1046 01/24/18 1115 01/24/18 1146 01/24/18 1200   BP: 106/73 105/73 111/62 (!) 98/59  Pulse: 73 70 79 74  Resp: (!) 21 (!) 47 20   Temp:      TempSrc:      SpO2: 96% 97% 95% 97%  Weight:   115.2 kg   Height:   5\' 11"  (1.803 m)    No intake or output data in the 24 hours ending 01/24/18 1314 Last 3 Weights 01/24/2018 01/24/2018 01/23/2018  Weight (lbs) 254 lb 254 lb 249 lb 12 oz  Weight (kg) 115.214 kg 115.214 kg 113.286 kg     Body mass index is 35.43 kg/m.  General:  Well nourished, well developed, in no acute distress HEENT: normal Lymph: no adenopathy Neck: no JVD Endocrine:  No thryomegaly Vascular: No carotid bruits Cardiac: RRR; no murmurs, gallops or rubs Lungs:  CTA b/l, no wheezing, rhonchi or rales  Abd: soft, nontender Ext: no edema Musculoskeletal:  No deformities Skin: warm and dry  Neuro:  no focal abnormalities noted Psych:  Normal affect   EKG:  The EKG was personally reviewed and demonstrates:   SB 78bpm, [id number] Telemetry:  Telemetry was personally reviewed and demonstrates:   SR, one brief SVT self terminated lasting 3-4 seconds  Relevant CV Studies:  11/15/17: TTE Study Conclusions - Left ventricle: The cavity size was normal. Systolic function was   normal. The estimated ejection fraction was in the range of 55%   to 60%. Wall motion was normal; there were no regional wall   motion abnormalities. Left ventricular diastolic function   parameters were normal.  Laboratory Data:  Chemistry Recent Labs  Lab 01/20/18 0950 01/21/18 1224 01/24/18 1220  NA 135 134* 137  K 2.8* 3.0* 3.4*  CL 97* 99 106  CO2 26 24 23   GLUCOSE 185* 129* 119*  BUN 12 11 11  CREATININE 1.02 0.88 1.04  CALCIUM 8.6* 8.5* 8.5*  GFRNONAA >60 >60 >60  GFRAA >60 >60 >60  ANIONGAP 12 11 8     Recent Labs  Lab 01/20/18 0950 01/21/18 1224  PROT 6.4* 6.2*  ALBUMIN 3.3* 2.9*  AST 19 31  ALT 27 33  ALKPHOS 73 68  BILITOT 0.8 0.7   Hematology Recent Labs  Lab 01/20/18 0950 01/21/18 1224 01/24/18 1220  WBC 3.6*  6.4 13.4*  RBC 3.83* 3.66* 3.30*  HGB 10.7* 10.1* 9.3*  HCT 32.6* 31.4* 28.9*  MCV 85.1 85.8 87.6  MCH 27.9 27.6 28.2  MCHC 32.8 32.2 32.2  RDW 13.2 13.6 14.3  PLT 388 381 352   Cardiac EnzymesNo results for input(s): TROPONINI in the last 168 hours. No results for input(s): TROPIPOC in the last 168 hours.  BNPNo results for input(s): BNP, PROBNP in the last 168 hours.  DDimer No results for input(s): DDIMER in the last 168 hours.  Radiology/Studies:   Dg Chest Port 1 View Result Date: 01/24/2018 CLINICAL DATA:  Supraventricular tachycardia. EXAM: PORTABLE CHEST 1 VIEW COMPARISON:  01/21/2018 FINDINGS: Chronic cardiomegaly and vascular pedicle widening. Porta catheter from the left with tip at the SVC. Interstitial coarsening that is stable to improved from prior. There is no consolidation, effusion, or pneumothorax. IMPRESSION: 1. Stable to improved appearance of the chest compared to 3 days ago. 2. Cardiomegaly, low volumes, and generalized interstitial opacity. Electronically Signed   By: Monte Fantasia M.D.   On: 01/24/2018 11:48     Assessment and Plan:   1. SVT, recurrent, looks mid-RP      Dr. Curt Bears has seen and examined the patient and discussed the case with Dr. Acie Fredrickson. He would like to go home He was given Rx and samples for multaq 400mg  BID , his Toprol changed to lopressor 50mg  BID and his losartan discontinued already by Dr. Acie Fredrickson.  He  continue this regime.Plan for EPS/Ablation on Wed with Dr. Curt Bears  Dr. Curt Bears discussed the procedure, it's possible risks and benefits the patient would like to proceed Dr. Macky Lower RN  call the patient with pre-procedure instructions The patient has been instructed to stop taking Multaq after Monday AM dose   The patient's oncologist has reached out, I spoke with Dr. Maylon Peppers.  Happy to hear we would be able to proceed next week only delaying his chemo another week.  We discussed his last dose of Multaq  be Monday  01/27/18 morning.  He is OK to discharge from the ER from our perspective    For questions or updates, please contact [place] Please consult [email or web address] for contact info under     Signed, [place], PA-C  01/24/2018 1:14 PM  I have seen and examined this patient with Tommye Standard.  Agree with above, note added to reflect my findings.  On exam, RRR, no murmurs, lungs clear.  Patient presented to the emergency room with recurrent episodes of SVT.  He was put on Multitak as an outpatient this morning which seems to have calmed down his arrhythmia.  Since his arrhythmia has been quite resistant to medications,  plan for ablation next Wednesday.  Risks and benefits were discussed include bleeding, tamponade, heart block, stroke.  The patient understands these risks and is agreed to the procedure.   M.  MD 01/24/2018 3:51 PM

## 2018-01-24 NOTE — Patient Instructions (Addendum)
Medication Instructions:  Your physician has recommended you make the following change in your medication:  STOP Metoprolol Succinate (Toprol) STOP Coreg (Carvedilol) STOP Losartan START Multaq (Dronedarone) 400 mg twice daily START Metoprolol Tartrate (Lopressor) 50 mg twice daily   If you need a refill on your cardiac medications before your next appointment, please call your pharmacy.    Lab work: None Ordered    Testing/Procedures: None Ordered    Follow-Up: Go straight to the [facility]  Your physician recommends that you schedule a follow-up appointment in: 2 months with Dr. Dahlia Client will schedule this for you

## 2018-01-24 NOTE — ED Provider Notes (Signed)
[place] EMERGENCY DEPARTMENT Provider Note   CSN: [id number] Arrival date & time: 01/24/18  1035     History   Chief Complaint Chief Complaint  Patient presents with  . Tachycardia    HPI Bradley Novak is a 68 y.o. male.  68 year old male on chemotherapy for Hodgkin's lymphoma presents with complaint of SVT.  Patient states this is a fairly new diagnosis for him, treated in the ER 3 days ago when he was found to be tachycardic prior to his chemo treatment and he was sent to the emergency room at [facility] for evaluation and treatment.  Patient was later discharged home on metoprolol, states his heart rate has been fluctuating since that time, seen in clinic today as a work in appointment for cardiology, heart rate 154, sent to the ER for admission for EP consult with plan for ablation on Thursday.  Patient reports fatigue and shortness of breath since starting chemo, otherwise denies any complaints or concerns.  Patient not in SVT upon arrival in the emergency room.     Past Medical History:  Diagnosis Date  . Cancer ([place])   . Hypertension 2006  . Obesity   . Wears glasses     Patient Active Problem List   Diagnosis Date Noted  . SVT (supraventricular tachycardia) ([place])   . Hypomagnesemia 01/20/2018  . Anemia due to antineoplastic chemotherapy 01/20/2018  . Malnutrition ([place]) 01/14/2018  . Hypokalemia 01/14/2018  . Insomnia 12/23/2017  . Dysuria 12/10/2017  . Chemotherapy induced neutropenia ([place]) 12/10/2017  . Cramp, abdominal 12/09/2017  . Alkaline phosphatase elevation 09/19/2017  . Pneumonia due to infectious organism 09/06/2017  . Fatigue 09/06/2017  . Hodgkin lymphoma ([place]) 08/29/2017  . Hoarseness 08/29/2017  . Cough 08/29/2017  . Screening for prostate cancer 08/29/2017  . Essential hypertension 03/08/2014  . Morbid obesity ([place]) 03/08/2014    Past Surgical History:  Procedure Laterality Date  . IR IMAGING GUIDED  PORT INSERTION  11/20/2017        Home Medications    Prior to Admission medications   Medication Sig Start Date End Date Taking? Authorizing Provider  amoxicillin-clavulanate (AUGMENTIN) 875-125 MG tablet Take 1 tablet by mouth 2 (two) times daily for 7 days. 01/17/18 01/24/18 Yes [name], MD  aspirin EC 81 MG tablet Take 1 tablet (81 mg total) by mouth daily. 08/30/17  Yes Tysinger, Camelia Eng, PA-C  atorvastatin (LIPITOR) 20 MG tablet TAKE 1 TABLET BY MOUTH DAILY Patient taking differently: Take 20 mg by mouth daily.  09/02/17  Yes Tysinger, Camelia Eng, PA-C  Cholecalciferol (VITAMIN D PO) Take 1 capsule by mouth at bedtime.   Yes [provider]  dexamethasone (DECADRON) 4 MG tablet Take 2 tablets by mouth once a day on the day after chemotherapy and then take 2 tablets two times a day for 2 days. Take with food. 11/25/17  Yes [name], MD  diclofenac (VOLTAREN) 75 MG EC tablet Take 75 mg by mouth daily.   Yes [provider]  dronedarone (MULTAQ) 400 MG tablet Take 1 tablet (400 mg total) by mouth 2 (two) times daily with a meal. 01/24/18  Yes Nahser, Wonda Cheng, MD  ibuprofen (ADVIL,MOTRIN) 200 MG tablet Take 400 mg by mouth daily as needed for moderate pain.   Yes [provider]  lactulose (CHRONULAC) 10 GM/15ML solution Take 15 mLs (10 g total) by mouth 2 (two) times daily as needed for up to 14 days for mild constipation.  01/14/18 01/28/18 Yes [name], MD  leucovorin (WELLCOVORIN) 15 MG tablet Take 1 tablet (15 mg total) by mouth daily. 01/20/18 02/19/18 Yes [name], MD  LORazepam (ATIVAN) 0.5 MG tablet Take 1 tablet (0.5 mg total) by mouth every 6 (six) hours as needed (Nausea or vomiting). 11/25/17  Yes [name], MD  magnesium oxide (MAG-OX) 400 (241.3 Mg) MG tablet Take 1 tablet (400 mg total) by mouth 2 (two) times daily. 01/17/18 02/16/18 Yes [name], MD  metoCLOPramide (REGLAN) 10 MG tablet Take 1 tablet (10 mg total) by mouth 3 (three) times daily before meals for  14 days. 01/14/18 01/28/18 Yes [name], MD  metoprolol tartrate (LOPRESSOR) 50 MG tablet Take 1 tablet (50 mg total) by mouth 2 (two) times daily. 01/24/18  Yes Nahser, Wonda Cheng, MD  Morphine Sulfate (MORPHINE CONCENTRATE) 10 mg / 0.5 ml concentrated solution Take 0.5 mLs (10 mg total) by mouth every 4 (four) hours as needed for up to 14 days for severe pain. 01/14/18 01/28/18 Yes [name], MD  ondansetron (ZOFRAN) 8 MG tablet Take 1 tablet (8 mg total) by mouth 2 (two) times daily as needed. Start on the third day after chemotherapy. 11/25/17  Yes [name], MD  polyethylene glycol [facility]) packet Take 17 g by mouth 2 (two) times daily. 01/14/18 02/13/18 Yes [name], MD  potassium chloride SA (K-DUR,KLOR-CON) 20 MEQ tablet Take 1 tablet (20 mEq total) by mouth 2 (two) times daily. 01/20/18 04/20/18 Yes [name], MD  prochlorperazine (COMPAZINE) 10 MG tablet Take 1 tablet (10 mg total) by mouth every 6 (six) hours as needed (Nausea or vomiting). 11/25/17  Yes [name], MD  ranitidine (ZANTAC) 150 MG tablet Take 150 mg by mouth daily as needed for heartburn.   Yes [provider]  vitamin B-12 (CYANOCOBALAMIN) 100 MCG tablet Take 100 mcg by mouth daily.   Yes [provider]    Family History Family History  Problem Relation Age of Onset  . Hypertension Father   . Hypertension Brother   . Hypertension Brother   . Hypertension Brother   . Hypertension Brother     Social History Social History   Tobacco Use  . Smoking status: Never Smoker  . Smokeless tobacco: Never Used  Substance Use Topics  . Alcohol use: No    Alcohol/week: 0.0 standard drinks  . Drug use: No     Allergies   Patient has no known allergies.   Review of Systems Review of Systems  Constitutional: Positive for fatigue. Negative for fever.  Respiratory: Positive for cough and shortness of breath.   Cardiovascular: Negative for chest pain and palpitations.  Gastrointestinal: Negative for nausea  and vomiting.  Allergic/Immunologic: Positive for immunocompromised state.  Neurological: Negative for dizziness and weakness.  Hematological: Does not bruise/bleed easily.  Psychiatric/Behavioral: Negative for confusion.  All other systems reviewed and are negative.    Physical Exam Updated Vital Signs BP 108/69   Pulse 72   Temp 97.7 F (36.5 C) (Oral)   Resp 19   Ht 5\' 11"  (1.803 m)   Wt 115.2 kg   SpO2 97%   BMI 35.43 kg/m   Physical Exam Vitals signs and nursing note reviewed.  Constitutional:      Appearance: Normal appearance. He is obese.  HENT:     Head: Normocephalic and atraumatic.     Nose: No congestion.     Mouth/Throat:     Mouth: Mucous membranes are moist.  Cardiovascular:  Rate and Rhythm: Normal rate and regular rhythm.     Pulses: Normal pulses.     Heart sounds: Normal heart sounds. No murmur.  Pulmonary:     Effort: Pulmonary effort is normal.     Breath sounds: Normal breath sounds.     Comments: Frequent nonproductive cough. Skin:    General: Skin is warm and dry.     Findings: No rash.  Neurological:     Mental Status: He is alert and oriented to person, place, and time.  Psychiatric:        Behavior: Behavior normal.      ED Treatments / Results  Labs (all labs ordered are listed, but only abnormal results are displayed) Labs Reviewed  CBC WITH DIFFERENTIAL/PLATELET - Abnormal; Notable for the following components:      Result Value   WBC 13.4 (*)    RBC 3.30 (*)    Hemoglobin 9.3 (*)    HCT 28.9 (*)    nRBC 0.5 (*)    Neutro Abs 9.9 (*)    Monocytes Absolute 1.3 (*)    Abs Immature Granulocytes 1.02 (*)    All other components within normal limits  BASIC METABOLIC PANEL - Abnormal; Notable for the following components:   Potassium 3.4 (*)    Glucose, Bld 119 (*)    Calcium 8.5 (*)    All other components within normal limits  MAGNESIUM    EKG None  Radiology Dg Chest Port 1 View  Result Date:  01/24/2018 CLINICAL DATA:  Supraventricular tachycardia. EXAM: PORTABLE CHEST 1 VIEW COMPARISON:  01/21/2018 FINDINGS: Chronic cardiomegaly and vascular pedicle widening. Porta catheter from the left with tip at the SVC. Interstitial coarsening that is stable to improved from prior. There is no consolidation, effusion, or pneumothorax. IMPRESSION: 1. Stable to improved appearance of the chest compared to 3 days ago. 2. Cardiomegaly, low volumes, and generalized interstitial opacity. Electronically Signed   By: Monte Fantasia M.D.   On: 01/24/2018 11:48    Procedures Procedures (including critical care time)  Medications Ordered in ED Medications  heparin lock flush 100 unit/mL (has no administration in time range)     Initial Impression / Assessment and Plan / ED Course  I have reviewed the triage vital signs and the nursing notes.  Pertinent labs & imaging results that were available during my care of the patient were reviewed by me and considered in my medical decision making (see chart for details).  Clinical Course as of Jan 25 1412  Fri Jan 25, 2539  3432 68 year old male with history of SVT, seen in cardiology clinic today with Dr. Acie Fredrickson, sent to the ER for admission for EP consult with plan for ablation on Thursday. Patient not in SVT on arrival to the ER, monitored, consult to [facility] with Cardiology who is aware of patient, plan is to admit.    [LM]  1414 Patient was evaluated by EP and cardiology team with plan for discharge home at this time.   [LM]    Clinical Course User Index [LM] Tacy Learn, PA-C   Final Clinical Impressions(s) / ED Diagnoses   Final diagnoses:  SVT (supraventricular tachycardia) [facility])    ED Discharge Orders    None       Tacy Learn, PA-C 01/24/18 1414    Little, Wenda Overland, MD 01/25/18 1528

## 2018-01-24 NOTE — Progress Notes (Signed)
Patient and RN stated that port was not giving blood return all day.  Patient allowed me to deaccess and reaccess his port to see if this would give blood return.  After reaccessing port, still did not give blood return, however it did flush easily. Patient stated he had a problem with blood return from his port yesterday.  Patient refusing at this time to TPA port.  Heparin instilled and port deaccessed as patient is being discharged.

## 2018-01-24 NOTE — ED Triage Notes (Signed)
Pt arrives from cardiologists office with reports of possible SVT. Pt was seen 3 days ago for the same. Denies any pain, reports weakness. States he checked his HR this AM and it was 165.

## 2018-01-24 NOTE — ED Notes (Signed)
Called Engineer, maintenance (IT)

## 2018-01-25 ENCOUNTER — Telehealth: Payer: Self-pay | Admitting: Physician Assistant

## 2018-01-25 NOTE — Telephone Encounter (Signed)
Ms. Bradley Novak called because [name] heart rate is very high and he is symptomatic with this.  He is feeling weak and short of breath.  Has not had chest pain.  Is unable to do much of anything, gets very short of breath going to the bathroom and back.  He has had his usual morning medications.  His heart rate has been as high as 170.  His current blood pressure is 132/80.  I advised that it was okay to give him an extra half tablet of the metoprolol 50 mg as long as his heart rate was above 75 and his systolic blood pressure was 120 or greater.  She prefers not to come back to the emergency room, so is going to try this.  However, at his current symptom level, he cannot stay home.  If his symptoms do not improve with the extra metoprolol, she will bring him to the emergency room.  Rosaria Ferries, PA-C 01/25/2018 1:49 PM Beeper [phone]

## 2018-01-27 ENCOUNTER — Ambulatory Visit: Payer: Medicare Other | Admitting: Hematology

## 2018-01-27 ENCOUNTER — Ambulatory Visit (INDEPENDENT_AMBULATORY_CARE_PROVIDER_SITE_OTHER): Payer: Medicare Other | Admitting: Medical

## 2018-01-27 ENCOUNTER — Other Ambulatory Visit: Payer: Medicare Other

## 2018-01-27 ENCOUNTER — Ambulatory Visit: Payer: Medicare Other

## 2018-01-27 ENCOUNTER — Telehealth: Payer: Self-pay | Admitting: Cardiovascular Disease

## 2018-01-27 ENCOUNTER — Encounter: Payer: Self-pay | Admitting: *Deleted

## 2018-01-27 ENCOUNTER — Telehealth: Payer: Self-pay | Admitting: Cardiology

## 2018-01-27 ENCOUNTER — Encounter: Payer: Self-pay | Admitting: Medical

## 2018-01-27 VITALS — BP 130/76 | HR 69 | Temp 97.5°F | Resp 16 | Ht 71.0 in | Wt 245.2 lb

## 2018-01-27 DIAGNOSIS — I10 Essential (primary) hypertension: Secondary | ICD-10-CM | POA: Diagnosis not present

## 2018-01-27 DIAGNOSIS — R05 Cough: Secondary | ICD-10-CM | POA: Diagnosis not present

## 2018-01-27 DIAGNOSIS — K219 Gastro-esophageal reflux disease without esophagitis: Secondary | ICD-10-CM

## 2018-01-27 DIAGNOSIS — C8190 Hodgkin lymphoma, unspecified, unspecified site: Secondary | ICD-10-CM

## 2018-01-27 DIAGNOSIS — R059 Cough, unspecified: Secondary | ICD-10-CM

## 2018-01-27 MED ORDER — DILTIAZEM HCL 30 MG PO TABS: 30.0000 mg | ORAL_TABLET | Freq: Four times a day (QID) | ORAL | 0 refills | Status: DC | PRN

## 2018-01-27 MED ORDER — HYDROCODONE-ACETAMINOPHEN 7.5-325 MG PO TABS: 1.0000 | ORAL_TABLET | Freq: Four times a day (QID) | ORAL | 0 refills | Status: DC | PRN

## 2018-01-27 NOTE — Telephone Encounter (Signed)
Wife states the pt's HR will jump into 150s or above by tonight if he does not have the Multaq on board. Aware that I will discuss further w/ Dr. Curt Bears and call them back before I leave today. Wife agreeable to plan.

## 2018-01-27 NOTE — Addendum Note (Signed)
Addended by: Thayer Headings on: 01/27/2018 09:45 AM   Modules accepted: Level of Service

## 2018-01-27 NOTE — Telephone Encounter (Signed)
New Message   Patients wife is calling on his behalf. She states that he is having a procedure on Wednesday. They have advised him to stop taking the multaq and metoprolol and she is concern that it will cause his HR to drop. Please call to discuss.

## 2018-01-27 NOTE — Telephone Encounter (Signed)
Advised pt may take Diltiazem 30 mg q6h prn for elevated HRs, per Dr. Curt Bears.  Wife agreeable to plan.   Procedure instructions reviewed w/ wife.  Letter will be sent via [place]. (they were instructed last week to stop Multaq today.  Last dose was this morning) Post procedure f/u scheduled. Patient's wife verbalized understanding and agreeable to plan.

## 2018-01-27 NOTE — Progress Notes (Signed)
Subjective:  Bradley Novak is a 68 y.o. male who presents for cough.   Here for cough for past few weeks.   Denies wheezing, SOB, sinus pressure, sore throat, headache.   Cough worse at night.   Was put on Augmentin few weeks ago by oncology, but cough persists.  He does have GERD, on medication for this.   Was recently put on Dronedarone recently by cardiology but these symptoms were present prior to beginning Dronedarone.   Has tried [employer], has tried Nyquil.  No other aggravating or relieving factors. No other complaint.  The following portions of the patient's history were reviewed and updated as appropriate: allergies, current medications, past family history, past medical history, past social history, past surgical history and problem list.   ROS as in subjective  Past Medical History:  Diagnosis Date  . Cancer ([place])   . Hypertension 2006  . Obesity   . Wears glasses      Objective: BP 130/76   Pulse 69   Temp (!) 97.5 F (36.4 C) (Oral)   Resp 16   Ht 5\' 11"  (1.803 m)   Wt 245 lb 3.2 oz (111.2 kg)   SpO2 95%   BMI 34.20 kg/m   General appearance: Alert, WD/WN, no distress                             Skin: warm, no rash, no diaphoresis                           Head: no sinus tenderness                            Eyes: conjunctiva normal, corneas clear, PERRLA                            Ears: pearly TMs, external ear canals normal                          Nose: septum midline, turbinates swollen, no erythema or discharge             Mouth/throat: MMM, tongue normal, no pharyngeal erythema                           Neck: supple, no adenopathy, no thyromegaly, nontender                          Heart: RRR, normal S1, S2, no murmurs                         Lungs: clear, no rhonchi, no wheezes, no rales                Extremities: no edema, nontender      Assessment: Encounter Diagnoses  Name Primary?  . Cough Yes  . Gastroesophageal reflux disease  without esophagitis   . Essential hypertension   . Hodgkin lymphoma, unspecified Hodgkin lymphoma type, unspecified body region [facility])      Plan:  Discussed possible causes and contributing factors for his cough.   Given some of the chemotherapy he has been using in recent weeks and recent chest xray findings, we will use medication  below short term to help with cough.  He will let me know tomorrow if not getting some improvement in the next 24 hours.  He will let cardiology and oncology know about cough if not improving.      Given exam findings and time period for cough, we discussed wide differential but he wants to try medication first and see if improving over next 48 hours.   advised rest, c/t good hydration.  Call report 24 hours.

## 2018-01-27 NOTE — Telephone Encounter (Signed)
Patient paged on call service to inform that patient's HR remains high and that he is symptomatic. He reports shortness of breath and was audibly tachypnic on the phone. Per patient and chart review was recently taken off of multaq in preparation for SVT and was given diltiazem 30mg  q4 PRN today for tachycardia. Patient's wife report first dose of dilt given about 2 hours ago and HR remains in the 160s. His most recent systolic BP was in the 638G - 140s.   I advised patient that given his symptomatic tachycardia he should come to the ER. Patient refused ER evaluation at this time.   Given his robust BP advised that wife could trial additional dose of diltiazem 30mg  with careful attention to BP and symptoms.   Will follow up with EP in the AM.   Lauren K. Marletta Lor, MD

## 2018-01-28 ENCOUNTER — Ambulatory Visit: Payer: Medicare Other

## 2018-01-28 NOTE — Telephone Encounter (Signed)
Advised of change to hospital scheduled tomorrow. Advised to arrive at 12:30 tomorrow. Wife is agreeable and will pass information along to pt.

## 2018-01-29 ENCOUNTER — Encounter (HOSPITAL_COMMUNITY): Payer: Self-pay | Admitting: General Practice

## 2018-01-29 ENCOUNTER — Ambulatory Visit: Payer: Medicare Other

## 2018-01-29 ENCOUNTER — Ambulatory Visit (HOSPITAL_BASED_OUTPATIENT_CLINIC_OR_DEPARTMENT_OTHER)
Admission: RE | Admit: 2018-01-29 | Discharge: 2018-01-30 | Disposition: A | Discharge status: Home / Self Care | Payer: Medicare Other | Source: Home / Self Care | Attending: Cardiology | Admitting: Cardiology

## 2018-01-29 ENCOUNTER — Other Ambulatory Visit (HOSPITAL_BASED_OUTPATIENT_CLINIC_OR_DEPARTMENT_OTHER): Payer: Medicare Other

## 2018-01-29 ENCOUNTER — Encounter (HOSPITAL_COMMUNITY)
Admission: RE | Disposition: A | Discharge status: Home / Self Care | Payer: Self-pay | Source: Home / Self Care | Attending: Cardiology

## 2018-01-29 ENCOUNTER — Other Ambulatory Visit: Payer: Self-pay

## 2018-01-29 DIAGNOSIS — Z791 Long term (current) use of non-steroidal anti-inflammatories (NSAID): Secondary | ICD-10-CM

## 2018-01-29 DIAGNOSIS — Z8249 Family history of ischemic heart disease and other diseases of the circulatory system: Secondary | ICD-10-CM

## 2018-01-29 DIAGNOSIS — I10 Essential (primary) hypertension: Secondary | ICD-10-CM | POA: Diagnosis present

## 2018-01-29 DIAGNOSIS — Z6834 Body mass index (BMI) 34.0-34.9, adult: Secondary | ICD-10-CM

## 2018-01-29 DIAGNOSIS — Z7982 Long term (current) use of aspirin: Secondary | ICD-10-CM

## 2018-01-29 DIAGNOSIS — Z6835 Body mass index (BMI) 35.0-35.9, adult: Secondary | ICD-10-CM

## 2018-01-29 DIAGNOSIS — Z87891 Personal history of nicotine dependence: Secondary | ICD-10-CM

## 2018-01-29 DIAGNOSIS — E785 Hyperlipidemia, unspecified: Secondary | ICD-10-CM | POA: Diagnosis not present

## 2018-01-29 DIAGNOSIS — K449 Diaphragmatic hernia without obstruction or gangrene: Secondary | ICD-10-CM | POA: Diagnosis not present

## 2018-01-29 DIAGNOSIS — Z79899 Other long term (current) drug therapy: Secondary | ICD-10-CM

## 2018-01-29 DIAGNOSIS — I471 Supraventricular tachycardia: Principal | ICD-10-CM | POA: Diagnosis present

## 2018-01-29 DIAGNOSIS — Z973 Presence of spectacles and contact lenses: Secondary | ICD-10-CM | POA: Diagnosis not present

## 2018-01-29 DIAGNOSIS — E669 Obesity, unspecified: Secondary | ICD-10-CM | POA: Diagnosis present

## 2018-01-29 DIAGNOSIS — C8190 Hodgkin lymphoma, unspecified, unspecified site: Secondary | ICD-10-CM | POA: Diagnosis present

## 2018-01-29 HISTORY — PX: SVT ABLATION: EP1225

## 2018-01-29 HISTORY — DX: Personal history of other diseases of the digestive system: Z87.19

## 2018-01-29 HISTORY — DX: Pneumonia, unspecified organism: J18.9

## 2018-01-29 HISTORY — DX: Supraventricular tachycardia: I47.1

## 2018-01-29 HISTORY — DX: Supraventricular tachycardia, unspecified: I47.10

## 2018-01-29 HISTORY — DX: Hodgkin lymphoma, unspecified, unspecified site: C81.90

## 2018-01-29 LAB — POCT ACTIVATED CLOTTING TIME
Activated Clotting Time: 257 seconds
Activated Clotting Time: 164 seconds

## 2018-01-29 SURGERY — SVT ABLATION

## 2018-01-29 MED ORDER — MAGNESIUM OXIDE 400 (241.3 MG) MG PO TABS: 400.0000 mg | ORAL_TABLET | Freq: Two times a day (BID) | ORAL | Status: DC

## 2018-01-29 MED ORDER — DRONEDARONE HCL 400 MG PO TABS
400.0000 mg | ORAL_TABLET | Freq: Two times a day (BID) | ORAL | Status: DC
  Filled 2018-01-29: qty 1

## 2018-01-29 MED ORDER — HEPARIN (PORCINE) IN NACL 1000-0.9 UT/500ML-% IV SOLN
INTRAVENOUS | Status: DC | PRN
  Administered 2018-01-29 (×2): 500 mL

## 2018-01-29 MED ORDER — ENSURE ENLIVE PO LIQD
237.0000 mL | Freq: Two times a day (BID) | ORAL | Status: DC
  Filled 2018-01-29 (×2): qty 237

## 2018-01-29 MED ORDER — VITAMIN B-12 100 MCG PO TABS
100.0000 ug | ORAL_TABLET | Freq: Every day | ORAL | Status: DC
  Filled 2018-01-29 (×2): qty 1

## 2018-01-29 MED ORDER — THE SENSUOUS HEART BOOK
Freq: Once | Status: AC
  Administered 2018-01-29: 22:00:00
  Filled 2018-01-29: qty 1

## 2018-01-29 MED ORDER — HYDROCODONE-ACETAMINOPHEN 7.5-325 MG PO TABS
1.0000 | ORAL_TABLET | Freq: Four times a day (QID) | ORAL | Status: DC | PRN
  Administered 2018-01-29: 1 via ORAL
  Filled 2018-01-29: qty 1

## 2018-01-29 MED ORDER — SODIUM CHLORIDE 0.9% FLUSH: 3.0000 mL | INTRAVENOUS | Status: DC | PRN

## 2018-01-29 MED ORDER — MIDAZOLAM HCL 5 MG/5ML IJ SOLN
INTRAMUSCULAR | Status: AC
  Filled 2018-01-29: qty 5

## 2018-01-29 MED ORDER — BUPIVACAINE HCL (PF) 0.25 % IJ SOLN
INTRAMUSCULAR | Status: DC | PRN
  Administered 2018-01-29: 40 mL

## 2018-01-29 MED ORDER — HEPARIN SODIUM (PORCINE) 1000 UNIT/ML IJ SOLN
INTRAMUSCULAR | Status: DC | PRN
  Administered 2018-01-29: 14000 [IU] via INTRAVENOUS
  Administered 2018-01-29: 1000 [IU] via INTRAVENOUS

## 2018-01-29 MED ORDER — ATORVASTATIN CALCIUM 10 MG PO TABS: 20.0000 mg | ORAL_TABLET | Freq: Every day | ORAL | Status: DC

## 2018-01-29 MED ORDER — FENTANYL CITRATE (PF) 100 MCG/2ML IJ SOLN
INTRAMUSCULAR | Status: AC
  Filled 2018-01-29: qty 2

## 2018-01-29 MED ORDER — POLYETHYLENE GLYCOL 3350 17 G PO PACK: 17.0000 g | PACK | Freq: Two times a day (BID) | ORAL | Status: DC

## 2018-01-29 MED ORDER — METOPROLOL TARTRATE 25 MG PO TABS: 50.0000 mg | ORAL_TABLET | Freq: Two times a day (BID) | ORAL | Status: DC

## 2018-01-29 MED ORDER — SODIUM CHLORIDE 0.9% FLUSH
3.0000 mL | Freq: Two times a day (BID) | INTRAVENOUS | Status: DC
  Administered 2018-01-29: 3 mL via INTRAVENOUS

## 2018-01-29 MED ORDER — POTASSIUM CHLORIDE CRYS ER 20 MEQ PO TBCR: 20.0000 meq | EXTENDED_RELEASE_TABLET | Freq: Two times a day (BID) | ORAL | Status: DC

## 2018-01-29 MED ORDER — PROCHLORPERAZINE MALEATE 10 MG PO TABS
10.0000 mg | ORAL_TABLET | Freq: Four times a day (QID) | ORAL | Status: DC | PRN
  Filled 2018-01-29: qty 1

## 2018-01-29 MED ORDER — HEPARIN SODIUM (PORCINE) 1000 UNIT/ML IJ SOLN
INTRAMUSCULAR | Status: AC
  Filled 2018-01-29: qty 2

## 2018-01-29 MED ORDER — PROTAMINE SULFATE 10 MG/ML IV SOLN
INTRAVENOUS | Status: DC | PRN
  Administered 2018-01-29: 40 mg via INTRAVENOUS

## 2018-01-29 MED ORDER — DILTIAZEM HCL 30 MG PO TABS
30.0000 mg | ORAL_TABLET | Freq: Four times a day (QID) | ORAL | Status: DC | PRN
  Filled 2018-01-29: qty 1

## 2018-01-29 MED ORDER — ASPIRIN EC 81 MG PO TBEC: 81.0000 mg | DELAYED_RELEASE_TABLET | Freq: Every day | ORAL | Status: DC

## 2018-01-29 MED ORDER — SODIUM CHLORIDE 0.9 % IV SOLN
INTRAVENOUS | Status: DC
  Administered 2018-01-29: 13:00:00 via INTRAVENOUS

## 2018-01-29 MED ORDER — SODIUM CHLORIDE 0.9 % IV SOLN: 250.0000 mL | INTRAVENOUS | Status: DC | PRN

## 2018-01-29 MED ORDER — ONDANSETRON HCL 4 MG/2ML IJ SOLN: 4.0000 mg | Freq: Four times a day (QID) | INTRAMUSCULAR | Status: DC | PRN

## 2018-01-29 MED ORDER — HEPARIN SODIUM (PORCINE) 1000 UNIT/ML IJ SOLN
INTRAMUSCULAR | Status: AC
  Filled 2018-01-29: qty 1

## 2018-01-29 MED ORDER — IBUPROFEN 200 MG PO TABS: 400.0000 mg | ORAL_TABLET | Freq: Every day | ORAL | Status: DC | PRN

## 2018-01-29 MED ORDER — MIDAZOLAM HCL 5 MG/5ML IJ SOLN
INTRAMUSCULAR | Status: DC | PRN
  Administered 2018-01-29 (×6): 1 mg via INTRAVENOUS

## 2018-01-29 MED ORDER — LORAZEPAM 0.5 MG PO TABS: 0.5000 mg | ORAL_TABLET | Freq: Four times a day (QID) | ORAL | Status: DC | PRN

## 2018-01-29 MED ORDER — OFF THE BEAT BOOK
Freq: Once | Status: AC
  Administered 2018-01-29: 22:00:00
  Filled 2018-01-29: qty 1

## 2018-01-29 MED ORDER — BUPIVACAINE HCL (PF) 0.25 % IJ SOLN
INTRAMUSCULAR | Status: AC
  Filled 2018-01-29: qty 30

## 2018-01-29 MED ORDER — METOCLOPRAMIDE HCL 10 MG PO TABS
10.0000 mg | ORAL_TABLET | Freq: Three times a day (TID) | ORAL | Status: DC
  Filled 2018-01-29 (×2): qty 1

## 2018-01-29 MED ORDER — HEPARIN (PORCINE) IN NACL 1000-0.9 UT/500ML-% IV SOLN
INTRAVENOUS | Status: AC
  Filled 2018-01-29: qty 500

## 2018-01-29 MED ORDER — DICLOFENAC SODIUM 75 MG PO TBEC
75.0000 mg | DELAYED_RELEASE_TABLET | Freq: Every day | ORAL | Status: DC
  Filled 2018-01-29 (×2): qty 1

## 2018-01-29 MED ORDER — ACETAMINOPHEN 325 MG PO TABS: 650.0000 mg | ORAL_TABLET | ORAL | Status: DC | PRN

## 2018-01-29 MED ORDER — PROTAMINE SULFATE 10 MG/ML IV SOLN
INTRAVENOUS | Status: AC
  Filled 2018-01-29: qty 5

## 2018-01-29 MED ORDER — FENTANYL CITRATE (PF) 100 MCG/2ML IJ SOLN
INTRAMUSCULAR | Status: DC | PRN
  Administered 2018-01-29 (×6): 25 ug via INTRAVENOUS

## 2018-01-29 SURGICAL SUPPLY — 20 items
BAG SNAP BAND KOVER 36X36 (MISCELLANEOUS) ×2 IMPLANT
CATH JOSEPH QUAD ALLRED 6F REP (CATHETERS) ×4 IMPLANT
CATH SMTCH THERMOCOOL SF DF (CATHETERS) ×2 IMPLANT
CATH SOUNDSTAR 3D IMAGING (CATHETERS) ×2 IMPLANT
CATH WEBSTER BI DIR CS D-F CRV (CATHETERS) ×2 IMPLANT
COVER SWIFTLINK CONNECTOR (BAG) ×2 IMPLANT
PACK EP LATEX FREE (CUSTOM PROCEDURE TRAY) ×1
PACK EP LF (CUSTOM PROCEDURE TRAY) ×1 IMPLANT
PAD PRO RADIOLUCENT 2001M-C (PAD) ×2 IMPLANT
PATCH CARTO3 (PAD) ×2 IMPLANT
SHEATH AVANTI 11F 11CM (SHEATH) ×2 IMPLANT
SHEATH BAYLIS SUREFLEX  M 8.5 (SHEATH) ×1
SHEATH BAYLIS SUREFLEX M 8.5 (SHEATH) ×1 IMPLANT
SHEATH BAYLIS TRANSSEPTAL 98CM (NEEDLE) ×2 IMPLANT
SHEATH PINNACLE 6F 10CM (SHEATH) ×4 IMPLANT
SHEATH PINNACLE 7F 10CM (SHEATH) ×2 IMPLANT
SHEATH PINNACLE 8F 10CM (SHEATH) ×2 IMPLANT
SHEATH PINNACLE 9F 10CM (SHEATH) ×2 IMPLANT
SHIELD RADPAD SCOOP 12X17 (MISCELLANEOUS) ×2 IMPLANT
TUBING SMART ABLATE COOLFLOW (TUBING) ×2 IMPLANT

## 2018-01-29 NOTE — Discharge Summary (Addendum)
ELECTROPHYSIOLOGY PROCEDURE DISCHARGE SUMMARY    Patient ID: Bradley Novak,  MRN: [id number], DOB/AGE: 67-Oct-1952 67 y.o.  Admit date: 01/29/2018 Discharge date: 01/30/2018  Primary Care Physician: Carlena Hurl, PA-C  Primary Cardiologist: Dr. Acie Fredrickson Electrophysiologist: Dr. Curt Bears  Primary Discharge Diagnosis:  1. SVT  Secondary Discharge Diagnosis:  1. Hodgkin's Lymphoma     Undergoing treatment 2. HTN  No Known Allergies   Procedures This Admission: 1.  Electrophysiology study and radiofrequency catheter ablation on 01/29/2018 by Dr Curt Bears.   This study demonstrated CONCLUSIONS:  1.  SVT upon presentation.  2. The patient had orthodromic reentrant tachycardia  3. Successful radiofrequency modification of the accessory pathway at CS 5, 6 4. No inducible arrhythmias following ablation.  5. No early apparent complications    Brief HPI: Bradley Novak is a 68 y.o. male with a past medical includes above.  He has developed persistent/difficult to control SVT.   Risks, benefits, and alternatives to ablation were reviewed with the patient who wished to proceed.   Hospital Course:  The patient was admitted and underwent EPS/RFCA with details as outlined above.  He was monitored on telemetry overnight which demonstrated SR.  b/l groin sites are without complication.  The patient feels well this morning, no CP, SOB or procedure site pain, he was examined by Dr Curt Bears who considered the patient stable for discharge to home.  Follow up has been arranged in 4 weeks.  Wound care and restrictions were reviewed with the patient prior to discharge.   He has follow up with his oncologist in place  Physical Exam: Vitals:   01/29/18 2023 01/29/18 2351 01/30/18 0436 01/30/18 0745  BP: (!) 143/79 (!) 157/76 (!) 143/85 (!) 149/85  Pulse: 84 85 83 83  Resp: 20 (!) 28 (!) 23 (!) 25  Temp: 98.3 F (36.8 C)  98.2 F (36.8 C) 98.2 F (36.8 C)  TempSrc: Oral  Oral Oral    SpO2: 92% 90% 98% 98%  Weight:   108.2 kg   Height:        GEN- The patient is well appearing, alert and oriented x 3 today.   HEENT: normocephalic, atraumatic; sclera clear, conjunctiva pink; hearing intact; oropharynx clear; neck supple, no JVP Lymph- no cervical lymphadenopathy Lungs- CTA b/l, normal work of breathing.  No wheezes, rales, rhonchi Heart- RRR, no murmurs, rubs or gallops, PMI not laterally displaced GI- soft, non-tender, non-distended Extremities- no clubbing, cyanosis, or edema; DP/PT 2+ bilaterally, b/l groins are without hematoma/bruit/bleeding MS- no significant deformity or atrophy Skin- warm and dry, no rash or lesion Psych- euthymic mood, full affect Neuro- strength and sensation are intact   Labs:   Lab Results  Component Value Date   WBC 13.4 (H) 01/24/2018   HGB 9.3 (L) 01/24/2018   HCT 28.9 (L) 01/24/2018   MCV 87.6 01/24/2018   PLT 352 01/24/2018    Recent Labs  Lab 01/24/18 1220  NA 137  K 3.4*  CL 106  CO2 23  BUN 11  CREATININE 1.04  CALCIUM 8.5*  GLUCOSE 119*    Discharge Medications:  Allergies as of 01/30/2018   No Known Allergies     Medication List    STOP taking these medications   diltiazem 30 MG tablet Commonly known as:  CARDIZEM   dronedarone 400 MG tablet Commonly known as:  MULTAQ   metoprolol tartrate 50 MG tablet Commonly known as:  LOPRESSOR     TAKE these medications  aspirin EC 81 MG tablet Take 1 tablet (81 mg total) by mouth daily.   atorvastatin 20 MG tablet Commonly known as:  LIPITOR TAKE 1 TABLET BY MOUTH DAILY What changed:    how much to take  how to take this  when to take this  additional instructions   carvedilol 6.25 MG tablet Commonly known as:  COREG Take 1 tablet (6.25 mg total) by mouth 2 (two) times daily with a meal.   dexamethasone 4 MG tablet Commonly known as:  DECADRON Take 2 tablets by mouth once a day on the day after chemotherapy and then take 2 tablets two  times a day for 2 days. Take with food.   diclofenac 75 MG EC tablet Commonly known as:  VOLTAREN Take 75 mg by mouth daily.   HYDROcodone-acetaminophen 7.5-325 MG tablet Commonly known as:  NORCO Take 1 tablet by mouth every 6 (six) hours as needed for moderate pain.   ibuprofen 200 MG tablet Commonly known as:  ADVIL,MOTRIN Take 400 mg by mouth daily as needed for moderate pain.   leucovorin 15 MG tablet Commonly known as:  WELLCOVORIN Take 1 tablet (15 mg total) by mouth daily. Notes to patient:  Reported as not actively taking, please confirm with the original prescribing doctor    LORazepam 0.5 MG tablet Commonly known as:  ATIVAN Take 1 tablet (0.5 mg total) by mouth every 6 (six) hours as needed (Nausea or vomiting).   losartan 50 MG tablet Commonly known as:  COZAAR Take 1 tablet (50 mg total) by mouth daily.   magnesium oxide 400 (241.3 Mg) MG tablet Commonly known as:  MAG-OX Take 1 tablet (400 mg total) by mouth 2 (two) times daily.   metoCLOPramide 10 MG tablet Commonly known as:  REGLAN Take 1 tablet (10 mg total) by mouth 3 (three) times daily before meals for 14 days.   ondansetron 8 MG tablet Commonly known as:  ZOFRAN Take 1 tablet (8 mg total) by mouth 2 (two) times daily as needed. Start on the third day after chemotherapy.   polyethylene glycol packet Commonly known as:  MIRALAX Take 17 g by mouth 2 (two) times daily.   potassium chloride SA 20 MEQ tablet Commonly known as:  K-DUR,KLOR-CON Take 1 tablet (20 mEq total) by mouth 2 (two) times daily.   prochlorperazine 10 MG tablet Commonly known as:  COMPAZINE Take 1 tablet (10 mg total) by mouth every 6 (six) hours as needed (Nausea or vomiting).   ranitidine 150 MG tablet Commonly known as:  ZANTAC Take 150 mg by mouth daily as needed for heartburn.   vitamin B-12 100 MCG tablet Commonly known as:  CYANOCOBALAMIN Take 100 mcg by mouth daily.   VITAMIN D PO Take 1 capsule by mouth at  bedtime.       Disposition:  Home  Discharge Instructions    Diet - low sodium heart healthy   Complete by:  As directed    Increase activity slowly   Complete by:  As directed      Follow-up Information    Constance Haw, MD Follow up.   Specialty:  Cardiology Why:  03/10/2018 @ 3:45PM Contact information: [address] [place]  [place] [address] [phone]           Duration of Discharge Encounter: Greater than 30 minutes including physician time.  Signed, Tommye Standard, PA-C 01/30/2018 9:11 AM   I have seen and examined this patient with Tommye Standard.  Agree with above, note added to reflect my findings.  On exam, RRR, no murmur, lungs clear. S/p ablation for left lateral concealed accessory pathway and ORT. Plan for discharge today with follow up in clinic.    Deaun Rocha M. Cadence Haslam MD 01/30/2018 5:20 PM

## 2018-01-29 NOTE — CV Procedure (Signed)
6 Fr. And 8 Fr. From R FV and 6 Fr. And 7 Fr, L F/V sheaths were pulled manually, and pressure was held for 2 x 20 min. Both groins are soft and non tender.  Patient was given instructions about his bed rest, which started at 1800. X 6 hr. BP 152/80 Hr 78 NSR SPO2 95 % on R/A

## 2018-01-29 NOTE — Discharge Instructions (Signed)
Post procedure care instructions No driving for 4 days. No lifting over 5 lbs for 1 week. No vigorous or sexual activity for 1 week. You may return to work on 02/05/2018. Keep procedure site clean & dry. If you notice increased pain, swelling, bleeding or pus, call/return!  You may shower, but no soaking baths/hot tubs/pools for 1 week.

## 2018-01-29 NOTE — H&P (Signed)
Bradley Novak has presented today for surgery, with the diagnosis of SVT.  The various methods of treatment have been discussed with the patient and family. After consideration of risks, benefits and other options for treatment, the patient has consented to  Procedure(s): Catheter ablation as a surgical intervention .  Risks include but not limited to bleeding, tamponade, heart block, stroke, damage to surrounding organs, among others. The patient's history has been reviewed, patient examined, no change in status, stable for surgery.  I have reviewed the patient's chart and labs.  Questions were answered to the patient's satisfaction.    Donielle Kaigler Curt Bears, MD 01/29/2018 1:00 PM

## 2018-01-30 ENCOUNTER — Other Ambulatory Visit: Payer: Self-pay

## 2018-01-30 ENCOUNTER — Emergency Department (HOSPITAL_BASED_OUTPATIENT_CLINIC_OR_DEPARTMENT_OTHER): Payer: Medicare Other

## 2018-01-30 ENCOUNTER — Ambulatory Visit: Payer: Medicare Other

## 2018-01-30 ENCOUNTER — Encounter (HOSPITAL_COMMUNITY): Payer: Self-pay | Admitting: Cardiology

## 2018-01-30 ENCOUNTER — Inpatient Hospital Stay (HOSPITAL_BASED_OUTPATIENT_CLINIC_OR_DEPARTMENT_OTHER)
Admission: EM | Admit: 2018-01-30 | Discharge: 2018-01-31 | DRG: 274 | Disposition: A | Discharge status: Home / Self Care | Payer: Medicare Other | Attending: Cardiology | Admitting: Cardiology

## 2018-01-30 ENCOUNTER — Other Ambulatory Visit (HOSPITAL_BASED_OUTPATIENT_CLINIC_OR_DEPARTMENT_OTHER): Payer: Self-pay

## 2018-01-30 DIAGNOSIS — Z8249 Family history of ischemic heart disease and other diseases of the circulatory system: Secondary | ICD-10-CM | POA: Diagnosis not present

## 2018-01-30 DIAGNOSIS — Z973 Presence of spectacles and contact lenses: Secondary | ICD-10-CM | POA: Diagnosis not present

## 2018-01-30 DIAGNOSIS — I10 Essential (primary) hypertension: Secondary | ICD-10-CM | POA: Diagnosis not present

## 2018-01-30 DIAGNOSIS — I471 Supraventricular tachycardia: Secondary | ICD-10-CM

## 2018-01-30 DIAGNOSIS — E785 Hyperlipidemia, unspecified: Secondary | ICD-10-CM | POA: Diagnosis not present

## 2018-01-30 DIAGNOSIS — I509 Heart failure, unspecified: Secondary | ICD-10-CM | POA: Diagnosis not present

## 2018-01-30 DIAGNOSIS — Z87891 Personal history of nicotine dependence: Secondary | ICD-10-CM | POA: Diagnosis not present

## 2018-01-30 DIAGNOSIS — Z79899 Other long term (current) drug therapy: Secondary | ICD-10-CM | POA: Diagnosis not present

## 2018-01-30 DIAGNOSIS — Z6834 Body mass index (BMI) 34.0-34.9, adult: Secondary | ICD-10-CM | POA: Diagnosis not present

## 2018-01-30 DIAGNOSIS — K449 Diaphragmatic hernia without obstruction or gangrene: Secondary | ICD-10-CM | POA: Diagnosis not present

## 2018-01-30 DIAGNOSIS — E669 Obesity, unspecified: Secondary | ICD-10-CM | POA: Diagnosis not present

## 2018-01-30 DIAGNOSIS — Z7982 Long term (current) use of aspirin: Secondary | ICD-10-CM | POA: Diagnosis not present

## 2018-01-30 DIAGNOSIS — Z791 Long term (current) use of non-steroidal anti-inflammatories (NSAID): Secondary | ICD-10-CM | POA: Diagnosis not present

## 2018-01-30 DIAGNOSIS — C8190 Hodgkin lymphoma, unspecified, unspecified site: Secondary | ICD-10-CM | POA: Diagnosis not present

## 2018-01-30 LAB — CBC
HCT: 31.1 % — ABNORMAL LOW (ref 39.0–52.0)
HEMOGLOBIN: 9.7 g/dL — AB (ref 13.0–17.0)
MCH: 27.1 pg (ref 26.0–34.0)
MCHC: 31.2 g/dL (ref 30.0–36.0)
MCV: 86.9 fL (ref 80.0–100.0)
Platelets: 362 10*3/uL (ref 150–400)
RBC: 3.58 MIL/uL — ABNORMAL LOW (ref 4.22–5.81)
RDW: 14.6 % (ref 11.5–15.5)
WBC: 12.2 10*3/uL — ABNORMAL HIGH (ref 4.0–10.5)
nRBC: 0.2 % (ref 0.0–0.2)

## 2018-01-30 LAB — COMPREHENSIVE METABOLIC PANEL
ALBUMIN: 2.8 g/dL — AB (ref 3.5–5.0)
ALT: 31 U/L (ref 0–44)
AST: 30 U/L (ref 15–41)
Alkaline Phosphatase: 125 U/L (ref 38–126)
Anion gap: 8 (ref 5–15)
BUN: 16 mg/dL (ref 8–23)
CO2: 23 mmol/L (ref 22–32)
Calcium: 8.6 mg/dL — ABNORMAL LOW (ref 8.9–10.3)
Chloride: 103 mmol/L (ref 98–111)
Creatinine, Ser: 0.73 mg/dL (ref 0.61–1.24)
GFR calc Af Amer: >60 mL/min (ref >60)
GFR calc non Af Amer: >60 mL/min (ref >60)
GLUCOSE: 130 mg/dL — AB (ref 70–99)
Potassium: 2.9 mmol/L — ABNORMAL LOW (ref 3.5–5.1)
Sodium: 134 mmol/L — ABNORMAL LOW (ref 135–145)
Total Bilirubin: 0.7 mg/dL (ref 0.3–1.2)
Total Protein: 6.7 g/dL (ref 6.5–8.1)

## 2018-01-30 LAB — TROPONIN I: Troponin I: 0.09 ng/mL (ref <0.03)

## 2018-01-30 LAB — MAGNESIUM: Magnesium: 2 mg/dL (ref 1.7–2.4)

## 2018-01-30 MED ORDER — ADENOSINE 6 MG/2ML IV SOLN
INTRAVENOUS | Status: AC
  Administered 2018-01-30: 6 mg via INTRAVENOUS
  Filled 2018-01-30: qty 10

## 2018-01-30 MED ORDER — ADENOSINE 6 MG/2ML IV SOLN
6.0000 mg | Freq: Once | INTRAVENOUS | Status: AC
  Administered 2018-01-30: 6 mg via INTRAVENOUS

## 2018-01-30 MED ORDER — LORAZEPAM 0.5 MG PO TABS: 0.5000 mg | ORAL_TABLET | Freq: Four times a day (QID) | ORAL | Status: DC | PRN

## 2018-01-30 MED ORDER — SODIUM CHLORIDE 0.9 % IV SOLN
INTRAVENOUS | Status: DC | PRN
  Administered 2018-01-30: 22:00:00 via INTRAVENOUS

## 2018-01-30 MED ORDER — ONDANSETRON HCL 4 MG/2ML IJ SOLN: 4.0000 mg | Freq: Four times a day (QID) | INTRAMUSCULAR | Status: DC | PRN

## 2018-01-30 MED ORDER — POTASSIUM CHLORIDE CRYS ER 20 MEQ PO TBCR
80.0000 meq | EXTENDED_RELEASE_TABLET | Freq: Once | ORAL | Status: AC
  Administered 2018-01-31: 80 meq via ORAL
  Filled 2018-01-30: qty 4

## 2018-01-30 MED ORDER — ESMOLOL HCL-SODIUM CHLORIDE 2000 MG/100ML IV SOLN
25.0000 ug/kg/min | INTRAVENOUS | Status: DC
  Administered 2018-01-30: 160 ug/kg/min via INTRAVENOUS
  Administered 2018-01-30: 30.807 ug/kg/min via INTRAVENOUS
  Administered 2018-01-31: 160 ug/kg/min via INTRAVENOUS
  Administered 2018-01-31 (×2): 130 ug/kg/min via INTRAVENOUS
  Filled 2018-01-30 (×6): qty 100

## 2018-01-30 MED ORDER — ASPIRIN EC 81 MG PO TBEC
81.0000 mg | DELAYED_RELEASE_TABLET | Freq: Every day | ORAL | Status: DC
  Filled 2018-01-30: qty 1

## 2018-01-30 MED ORDER — ACETAMINOPHEN 325 MG PO TABS: 650.0000 mg | ORAL_TABLET | ORAL | Status: DC | PRN

## 2018-01-30 MED ORDER — LEUCOVORIN CALCIUM 5 MG PO TABS
15.0000 mg | ORAL_TABLET | Freq: Every day | ORAL | Status: DC
  Filled 2018-01-30: qty 3

## 2018-01-30 MED ORDER — LOSARTAN POTASSIUM 50 MG PO TABS: 50.0000 mg | ORAL_TABLET | Freq: Every day | ORAL | Status: DC

## 2018-01-30 MED ORDER — CARVEDILOL 3.125 MG PO TABS
6.2500 mg | ORAL_TABLET | Freq: Two times a day (BID) | ORAL | Status: DC
  Administered 2018-01-30: 10:00:00 6.25 mg via ORAL
  Filled 2018-01-30: qty 2

## 2018-01-30 MED ORDER — ADENOSINE 6 MG/2ML IV SOLN
12.0000 mg | Freq: Once | INTRAVENOUS | Status: AC
  Administered 2018-01-30: 12 mg via INTRAVENOUS

## 2018-01-30 MED ORDER — LOSARTAN POTASSIUM 50 MG PO TABS
50.0000 mg | ORAL_TABLET | Freq: Every day | ORAL | Status: DC
  Filled 2018-01-30: qty 1

## 2018-01-30 MED ORDER — VITAMIN B-12 100 MCG PO TABS
100.0000 ug | ORAL_TABLET | Freq: Every day | ORAL | Status: DC
  Filled 2018-01-30: qty 1

## 2018-01-30 MED ORDER — ATORVASTATIN CALCIUM 10 MG PO TABS
20.0000 mg | ORAL_TABLET | Freq: Every day | ORAL | Status: DC
  Filled 2018-01-30: qty 2

## 2018-01-30 MED ORDER — ADENOSINE 6 MG/2ML IV SOLN
INTRAVENOUS | Status: AC
  Filled 2018-01-30: qty 8

## 2018-01-30 MED ORDER — LOSARTAN POTASSIUM 50 MG PO TABS: 50.0000 mg | ORAL_TABLET | Freq: Every day | ORAL | 6 refills | Status: DC

## 2018-01-30 MED ORDER — METOPROLOL TARTRATE 5 MG/5ML IV SOLN
5.0000 mg | Freq: Once | INTRAVENOUS | Status: DC
  Filled 2018-01-30: qty 5

## 2018-01-30 MED ORDER — ESMOLOL HCL-SODIUM CHLORIDE 2000 MG/100ML IV SOLN
INTRAVENOUS | Status: AC
  Filled 2018-01-30: qty 100

## 2018-01-30 MED ORDER — CARVEDILOL 6.25 MG PO TABS: 6.2500 mg | ORAL_TABLET | Freq: Two times a day (BID) | ORAL | 6 refills | Status: DC

## 2018-01-30 MED ORDER — HYDROCODONE-ACETAMINOPHEN 7.5-325 MG PO TABS: 1.0000 | ORAL_TABLET | Freq: Four times a day (QID) | ORAL | Status: DC | PRN

## 2018-01-30 MED ORDER — GUAIFENESIN-DM 100-10 MG/5ML PO SYRP: 5.0000 mL | ORAL_SOLUTION | ORAL | Status: DC | PRN

## 2018-01-30 NOTE — ED Notes (Signed)
After timeout done 6mg  adenosine pushed at 2107.  Pt tolerated well, NSR after.  EDP, RT, 2RNs at bedside.  Pt on zoll.  Pt reports feeling much better after adenosine.

## 2018-01-30 NOTE — H&P (Signed)
Cardiology History & Physical    Patient ID: Bradley Novak MRN: [id number], DOB: [birth year] Date of Encounter: 01/30/2018, 11:40 PM Primary Physician: Carlena Hurl, PA-C  Chief Complaint: Tachycardia   HPI: Bradley Novak is a 68 y.o. male with history of Hodgkin's lymphoma under active treatment, hypertension, orthodromic reciprocating tachycardia status post RFA on the day prior to admission.  Patient had a successful ablation procedure on 1/15, the tachycardia was noninducible at the end of the case.  He had uneventful overnight monitoring and was discharged earlier this morning.  He felt fine throughout the early part of the day, but then this evening, his heart rate was in the 150s.  He had some mild shortness of breath with this, but no chest pain or frank palpitations.  He took multiple readings of his heart rate and blood pressure this evening and found that his heart rate was sustained in the 150s, and his blood pressure was reportedly over 200.  He presented to [facility] with these symptoms, and was found to have a narrow complex tachycardia with rates in 70s.  This looks identical to his tachycardia upon the prior admission.  He was given 6 and then 12 mg of adenosine with only brief reversion to normal sinus rhythm.  He was started on IV esmolol drip, with better control of his heart rates, although he did intermittently have brief bursts of SVT.  Upon my interview, he is in sinus rhythm with no complaints at present.  He is on esmolol drip at 160 mcg/min.  Past Medical History:  Diagnosis Date  . History of hiatal hernia   . Hodgkin's lymphoma ([place]) 09/2017  . Hypertension 2006  . Obesity   . Pneumonia 09/2017  . SVT (supraventricular tachycardia) ([place])   . Wears glasses      Surgical History:  Past Surgical History:  Procedure Laterality Date  . CARDIAC CATHETERIZATION  1980s  . IR IMAGING GUIDED PORT INSERTION  11/20/2017  . SVT ABLATION   01/29/2018  . SVT ABLATION N/A 01/29/2018   Procedure: SVT ABLATION;  Surgeon: Constance Haw, MD;  Location: [facility];  Service: Cardiovascular;  Laterality: N/A;     Home Meds: Prior to Admission medications   Medication Sig Start Date End Date Taking? Authorizing Provider  aspirin EC 81 MG tablet Take 1 tablet (81 mg total) by mouth daily. 08/30/17   Tysinger, Camelia Eng, PA-C  atorvastatin (LIPITOR) 20 MG tablet TAKE 1 TABLET BY MOUTH DAILY Patient taking differently: Take 20 mg by mouth daily.  09/02/17   Tysinger, Camelia Eng, PA-C  carvedilol (COREG) 6.25 MG tablet Take 1 tablet (6.25 mg total) by mouth 2 (two) times daily with a meal. 01/30/18   Baldwin Jamaica, PA-C  Cholecalciferol (VITAMIN D PO) Take 1 capsule by mouth at bedtime.    [provider]  dexamethasone (DECADRON) 4 MG tablet Take 2 tablets by mouth once a day on the day after chemotherapy and then take 2 tablets two times a day for 2 days. Take with food. 11/25/17   [name], MD  diclofenac (VOLTAREN) 75 MG EC tablet Take 75 mg by mouth daily.    [provider]  HYDROcodone-acetaminophen (NORCO) 7.5-325 MG tablet Take 1 tablet by mouth every 6 (six) hours as needed for moderate pain. 01/27/18   Tysinger, Camelia Eng, PA-C  ibuprofen (ADVIL,MOTRIN) 200 MG tablet Take 400 mg by mouth daily as needed for moderate pain.  [provider]  leucovorin (WELLCOVORIN) 15 MG tablet Take 1 tablet (15 mg total) by mouth daily. 01/20/18 02/19/18  [name], MD  LORazepam (ATIVAN) 0.5 MG tablet Take 1 tablet (0.5 mg total) by mouth every 6 (six) hours as needed (Nausea or vomiting). 11/25/17   [name], MD  losartan (COZAAR) 50 MG tablet Take 1 tablet (50 mg total) by mouth daily. 01/30/18   Baldwin Jamaica, PA-C  magnesium oxide (MAG-OX) 400 (241.3 Mg) MG tablet Take 1 tablet (400 mg total) by mouth 2 (two) times daily. 01/17/18 02/16/18  [name], MD  metoCLOPramide (REGLAN) 10 MG tablet Take 1 tablet (10 mg  total) by mouth 3 (three) times daily before meals for 14 days. 01/14/18 01/28/18  [name], MD  ondansetron (ZOFRAN) 8 MG tablet Take 1 tablet (8 mg total) by mouth 2 (two) times daily as needed. Start on the third day after chemotherapy. 11/25/17   [name], MD  polyethylene glycol [facility]) packet Take 17 g by mouth 2 (two) times daily. 01/14/18 02/13/18  [name], MD  potassium chloride SA (K-DUR,KLOR-CON) 20 MEQ tablet Take 1 tablet (20 mEq total) by mouth 2 (two) times daily. 01/20/18 04/20/18  [name], MD  prochlorperazine (COMPAZINE) 10 MG tablet Take 1 tablet (10 mg total) by mouth every 6 (six) hours as needed (Nausea or vomiting). 11/25/17   [name], MD  ranitidine (ZANTAC) 150 MG tablet Take 150 mg by mouth daily as needed for heartburn.    [provider]  vitamin B-12 (CYANOCOBALAMIN) 100 MCG tablet Take 100 mcg by mouth daily.    [provider]    Allergies: No Known Allergies  Social History   Socioeconomic History  . Marital status: Married    Spouse name: Not on file  . Number of children: Not on file  . Years of education: Not on file  . Highest education level: Not on file  Occupational History  . Not on file  Social Needs  . Financial resource strain: Not on file  . Food insecurity:    Worry: Not on file    Inability: Not on file  . Transportation needs:    Medical: Not on file    Non-medical: Not on file  Tobacco Use  . Smoking status: Former Smoker    Packs/day: 2.00    Years: 5.00    Pack years: 10.00    Types: Cigarettes    Last attempt to quit: 1974    Years since quitting: 46.0  . Smokeless tobacco: Never Used  Substance and Sexual Activity  . Alcohol use: No  . Drug use: Never  . Sexual activity: Not Currently  Lifestyle  . Physical activity:    Days per week: Not on file    Minutes per session: Not on file  . Stress: Not on file  Relationships  . Social connections:    Talks on phone: Not on file    Gets together: Not  on file    Attends religious service: Not on file    Active member of club or organization: Not on file    Attends meetings of clubs or organizations: Not on file    Relationship status: Not on file  . Intimate partner violence:    Fear of current or ex partner: Not on file    Emotionally abused: Not on file    Physically abused: Not on file    Forced sexual activity: Not on file  Other Topics Concern  .  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 Hypertension Father   . Hypertension Brother   . Hypertension Brother   . Hypertension Brother   . Hypertension Brother     Review of Systems: All other systems reviewed and are otherwise negative except as noted above.  Labs:   Lab Results  Component Value Date   WBC 12.2 (H) 01/30/2018   HGB 9.7 (L) 01/30/2018   HCT 31.1 (L) 01/30/2018   MCV 86.9 01/30/2018   PLT 362 01/30/2018    Recent Labs  Lab 01/30/18 2117  NA 134*  K 2.9*  CL 103  CO2 23  BUN 16  CREATININE 0.73  CALCIUM 8.6*  PROT 6.7  BILITOT 0.7  ALKPHOS 125  ALT 31  AST 30  GLUCOSE 130*   Recent Labs    01/30/18 2117  TROPONINI 0.09*   Lab Results  Component Value Date   CHOL 131 08/29/2017   HDL 44 08/29/2017   LDLCALC 70 08/29/2017   TRIG 85 08/29/2017   No results found for: DDIMER  Radiology/Studies:  Nm Pet Image Restag (ps) Skull Base To Thigh  Result Date: 01/10/2018 CLINICAL DATA:  Subsequent treatment strategy for lymphoma. EXAM: NUCLEAR MEDICINE PET SKULL BASE TO THIGH TECHNIQUE: 12.5 mCi F-18 FDG was injected intravenously. Full-ring PET imaging was performed from the skull base to thigh after the radiotracer. CT data was obtained and used for attenuation correction and anatomic localization. Fasting blood glucose: 124 mg/dl  COMPARISON:  11/21/2017 FINDINGS: Mediastinal blood pool activity: SUV max 3.31 Liver activity: SUV max equals 4.88 NECK: Previous hypermetabolic right level 2 lymph node has an SUV max of 2.80, Deauville criteria 2. Previous hypermetabolic right posterior cervical triangle lymph nodes have an SUV max of 1.59, Deauville criteria 2. Previously referenced hypermetabolic right supraclavicular lymph node has resolved in the interval. Incidental CT findings: None CHEST: No hypermetabolic axillary, mediastinal, or hilar lymph nodes. Index subcarinal lymph node has an SUV max of 2.51, Deauville criteria 2. No hypermetabolic pulmonary nodules. Incidental CT findings: Aortic atherosclerosis. Calcifications within the LAD, left circumflex and RCA coronary arteries noted. ABDOMEN/PELVIS: No abnormal hypermetabolic activity within the liver, pancreas, adrenal glands, or spleen. No hypermetabolic lymph nodes in the abdomen or pelvis. The spleen measures 10.3 x 4.7 x 13.0 cm (volume = 330 cm^3). No abnormal uptake identified within the spleen, which has an SUV max is equal to 3.27. Incidental CT findings: none SKELETON: No focal hypermetabolic activity to suggest skeletal metastasis. Incidental CT findings: none IMPRESSION: 1. Interval complete response to therapy. No abnormal areas of persistent hypermetabolic adenopathy identified. 2.  Aortic Atherosclerosis (ICD10-I70.0). 3. Multi vessel coronary artery atherosclerotic calcifications. Electronically Signed   By: Kerby Moors M.D.   On: 01/10/2018 12:29   Dg Chest Port 1 View  Result Date: 01/30/2018 CLINICAL DATA:  Supraventricular tachycardia. EXAM: PORTABLE CHEST 1 VIEW COMPARISON:  01/24/2018. FINDINGS: Stable enlarged cardiac silhouette. Interval mild prominence of the pulmonary vasculature and interval increase in prominence of the interstitial markings with a somewhat patchy appearance in the right lung and left mid and lower lung zones. Possible minimal bilateral  pleural fluid. Right jugular porta catheter tip in the inferior aspect of the superior vena cava near the cavoatrial junction. Unremarkable  bones. IMPRESSION: Interval changes of congestive heart failure with stable cardiomegaly. Underlying pneumonia or pneumonitis cannot be excluded. Electronically Signed   By: Claudie Revering M.D.   On: 01/30/2018 21:40   Dg Chest Port 1 View  Result Date: 01/24/2018 CLINICAL DATA:  Supraventricular tachycardia. EXAM: PORTABLE CHEST 1 VIEW COMPARISON:  01/21/2018 FINDINGS: Chronic cardiomegaly and vascular pedicle widening. Porta catheter from the left with tip at the SVC. Interstitial coarsening that is stable to improved from prior. There is no consolidation, effusion, or pneumothorax. IMPRESSION: 1. Stable to improved appearance of the chest compared to 3 days ago. 2. Cardiomegaly, low volumes, and generalized interstitial opacity. Electronically Signed   By: Monte Fantasia M.D.   On: 01/24/2018 11:48   Dg Chest Portable 1 View  Result Date: 01/21/2018 CLINICAL DATA:  Chest pain. Tachycardia. History of lymphoma. EXAM: PORTABLE CHEST 1 VIEW COMPARISON:  Chest radiographs 08/29/2017. PET-CT 01/10/2018. FINDINGS: A left jugular Port-A-Cath terminates near the superior cavoatrial junction. The cardiac silhouette is normal in size. Lung volumes are low with increased interstitial markings diffusely throughout both lungs. No sizable pleural effusion or pneumothorax is identified. No acute osseous abnormality is seen. IMPRESSION: Low lung volumes with increased interstitial markings throughout both lungs, which could reflect atypical infection or edema. Electronically Signed   By: Logan Bores M.D.   On: 01/21/2018 13:12   Wt Readings from Last 3 Encounters:  01/30/18 111.2 kg  01/30/18 108.2 kg  01/27/18 111.2 kg    EKG: Several strips reviewed.  Some show sinus rhythm, and others show mid-long RP tachycardia, similar to prior ECGs of tachycardia.Marland Kitchen  Physical  Exam: Blood pressure 116/90, pulse 70, temperature 98.8 F (37.1 C), temperature source Oral, resp. rate (!) 28, weight 111.2 kg, SpO2 98 %. Body mass index is 34.19 kg/m. General: Well developed, well nourished, in no acute distress. Head: Normocephalic, atraumatic, sclera non-icteric, no xanthomas, nares are without discharge.  Neck: Negative for carotid bruits. JVD not elevated. Lungs: Clear bilaterally to auscultation without wheezes, rales, or rhonchi. Breathing is unlabored. Heart: RRR with S1 S2. No murmurs, rubs, or gallops appreciated. Abdomen: Soft, non-tender, non-distended with normoactive bowel sounds. No hepatomegaly. No rebound/guarding. No obvious abdominal masses. Msk:  Strength and tone appear normal for age. Extremities: No clubbing or cyanosis. No edema.  Distal pedal pulses are 2+ and equal bilaterally. Neuro: Alert and oriented X 3. No focal deficit. No facial asymmetry. Moves all extremities spontaneously. Psych:  Responds to questions appropriately with a normal affect.    Assessment and Plan  68 year old man with Hodgkin's lymphoma on active treatment, and recurrent narrow complex tachycardia 1 day after SVT ablation.  1.  SVT: At present, reasonably well controlled on IV esmolol drip.  We will continue this for the time being.  Would consider long-acting metoprolol as a possible oral agent to transition to tomorrow.  Plan for EP consult tomorrow.  Also, his K is 2.9, and we will aggressively replete this.  2.  Hypertension: Continue home losartan.  Holding home carvedilol, would consider transition to metoprolol XL as above.  3.  Hyperlipidemia: Continue home atorvastatin.  4.  Hodgkin's lymphoma: Under active treatment, will this this is on hold at the moment given his SVT.  Continue home leucovorin.    Signed, Doylene Canning, MD 01/30/2018, 11:40 PM

## 2018-01-30 NOTE — ED Triage Notes (Signed)
Pt c/o not feeling week ~6p-elevated HR and BP at home ~10 min PTA-pt had SVT ablation yesterday-to triage in w/c-NAD

## 2018-01-30 NOTE — ED Provider Notes (Signed)
[facility] Emergency Department Provider Note MRN:  [id number]  Arrival date & time: 01/31/18     Chief Complaint   Tachycardia   History of Present Illness   Bradley Novak is a 68 y.o. year-old male with a history of Hodgkin's lymphoma, SVT status post ablation presenting to the ED with chief complaint of tachycardia.  Patient explains that he has a history of recent SVT and had an ablation performed yesterday, was discharged from the hospital.  Today at 6 PM, he experienced a sudden sensation of feeling weak, checked his heart rate and found it to be in the 160s, blood pressure was also elevated in the 409W systolic.  Symptoms did not resolve.  Denies palpitations, no chest pain, no shortness of breath, no headache or vision change, no nausea or vomiting, no abdominal pain.  Review of Systems  A complete 10 system review of systems was obtained and all systems are negative except as noted in the HPI and PMH.   Patient's Health History    Past Medical History:  Diagnosis Date  . History of hiatal hernia   . Hodgkin's lymphoma ([place]) 09/2017  . Hypertension 2006  . Obesity   . Pneumonia 09/2017  . SVT (supraventricular tachycardia) ([place])   . Wears glasses     Past Surgical History:  Procedure Laterality Date  . CARDIAC CATHETERIZATION  1980s  . IR IMAGING GUIDED PORT INSERTION  11/20/2017  . SVT ABLATION  01/29/2018  . SVT ABLATION N/A 01/29/2018   Procedure: SVT ABLATION;  Surgeon: Constance Haw, MD;  Location: [facility];  Service: Cardiovascular;  Laterality: N/A;    Family History  Problem Relation Age of Onset  . Hypertension Father   . Hypertension Brother   . Hypertension Brother   . Hypertension Brother   . Hypertension Brother     Social History   Socioeconomic History  . Marital status: Married    Spouse name: Not on file  . Number of children: Not on file  . Years of education: Not on file  . Highest  education level: Not on file  Occupational History  . Not on file  Social Needs  . Financial resource strain: Not on file  . Food insecurity:    Worry: Not on file    Inability: Not on file  . Transportation needs:    Medical: Not on file    Non-medical: Not on file  Tobacco Use  . Smoking status: Former Smoker    Packs/day: 2.00    Years: 5.00    Pack years: 10.00    Types: Cigarettes    Last attempt to quit: 1974    Years since quitting: 46.0  . Smokeless tobacco: Never Used  Substance and Sexual Activity  . Alcohol use: No  . Drug use: Never  . Sexual activity: Not Currently  Lifestyle  . Physical activity:    Days per week: Not on file    Minutes per session: Not on file  . Stress: Not on file  Relationships  . Social connections:    Talks on phone: Not on file    Gets together: Not on file    Attends religious service: Not on file    Active member of club or organization: Not on file    Attends meetings of clubs or organizations: Not on file    Relationship status: Not on file  . Intimate partner violence:    Fear of current or  ex partner: Not on file    Emotionally abused: Not on file    Physically abused: Not on file    Forced sexual activity: Not on file  Other Topics Concern  . 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, volunteers at food pantry     Physical Exam  Vital Signs and Nursing Notes reviewed Vitals:   01/30/18 2345 01/31/18 0000  BP: (!) 141/88 (!) 150/82  Pulse: (!) 127 75  Resp: (!) 23 (!) 27  Temp:    SpO2: 96% 99%    CONSTITUTIONAL:  well-appearing, NAD NEURO:  Alert and oriented x 3, no focal deficits EYES:  eyes equal and reactive ENT/NECK:  no LAD, no JVD CARDIO:  tachycardic rate, well-perfused, normal S1 and S2 PULM:  CTAB no wheezing or rhonchi, tachypneic GI/GU:  normal bowel sounds, non-distended,  non-tender MSK/SPINE:  No gross deformities, no edema SKIN:  no rash, atraumatic PSYCH:  Appropriate speech and behavior  Diagnostic and Interventional Summary    EKG Interpretation  Date/Time:    Ventricular Rate:    PR Interval:    QRS Duration:   QT Interval:    QTC Calculation:   R Axis:     Text Interpretation:         Labs Reviewed  CBC - Abnormal; Notable for the following components:      Result Value   WBC 12.2 (*)    RBC 3.58 (*)    Hemoglobin 9.7 (*)    HCT 31.1 (*)    All other components within normal limits  COMPREHENSIVE METABOLIC PANEL - Abnormal; Notable for the following components:   Sodium 134 (*)    Potassium 2.9 (*)    Glucose, Bld 130 (*)    Calcium 8.6 (*)    Albumin 2.8 (*)    All other components within normal limits  TROPONIN I - Abnormal; Notable for the following components:   Troponin I 0.09 (*)    All other components within normal limits  MRSA PCR SCREENING  MAGNESIUM    DG Chest Port 1 View  Final Result      Medications  esmolol (BREVIBLOC) 2000 mg / 100 mL (20 mg/mL) infusion (160 mcg/kg/min  108.2 kg Intravenous New Bag/Given 01/30/18 2337)  0.9 %  sodium chloride infusion ( Intravenous Rate/Dose Change 01/30/18 2145)  acetaminophen (TYLENOL) tablet 650 mg (has no administration in time range)  ondansetron (ZOFRAN) injection 4 mg (has no administration in time range)  potassium chloride SA (K-DUR,KLOR-CON) CR tablet 80 mEq (has no administration in time range)  aspirin EC tablet 81 mg (has no administration in time range)  atorvastatin (LIPITOR) tablet 20 mg (has no administration in time range)  HYDROcodone-acetaminophen (NORCO) 7.5-325 MG per tablet 1 tablet (has no administration in time range)  leucovorin (WELLCOVORIN) tablet 15 mg (has no administration in time range)  losartan (COZAAR) tablet 50 mg (has no administration in time range)  LORazepam (ATIVAN) tablet 0.5 mg (has no administration in time range)  vitamin  B-12 (CYANOCOBALAMIN) tablet 100 mcg (has no administration in time range)  adenosine (ADENOCARD) 6 MG/2ML injection 6 mg (6 mg Intravenous Given 01/30/18 2107)  adenosine (ADENOCARD) 6 MG/2ML injection 12 mg (12 mg Intravenous Given 01/30/18 2122)     .Cardioversion Date/Time: 01/30/2018 11:24 PM Performed by: Maudie Flakes, MD Authorized by: Gerlene Fee  M, MD   Consent:    Consent obtained:  Verbal   Consent given by:  Patient   Risks discussed:  Pain   Alternatives discussed:  No treatment Pre-procedure details:    Cardioversion basis:  Emergent   Rhythm:  Supraventricular tachycardia   Electrode placement:  Anterior-posterior Patient sedated: No Attempt one:    Cardioversion mode attempt one: medical cardioversion with adenosine 6 mg.   Shock outcome:  Conversion to normal sinus rhythm Attempt two:    Cardioversion mode attempt two: adenosine 12mg .   Shock outcome:  Conversion to normal sinus rhythm Post-procedure details:    Patient status:  Alert   Patient tolerance of procedure:  Tolerated well, no immediate complications Comments:     Successful conversion to normal sinus rhythm on both attempts, but unfortunately with recurrence of SVT after each attempt.     Critical Care Critical Care Documentation Critical care time provided by me (excluding procedures): 38 minutes  Condition necessitating critical care: Refractory supraventricular tachycardia, type II NSTEMI  Components of critical care management: reviewing of prior records, laboratory and imaging interpretation, frequent re-examination and reassessment of vital signs, administration of IV adenosine, IV esmolol, discussion with consulting services, facilitation of transfer.    ED Course and Medical Decision Making  I have reviewed the triage vital signs and the nursing notes.  Pertinent labs & imaging results that were available during my care of the patient were reviewed by me and considered in my  medical decision making (see below for details).  Recurrent SVT in this 68 year old male with a recent ablation yesterday, easily converted to sinus rhythm after adenosine but unable to maintain sinus rhythm.  Discussed with cardiology on-call, started on esmolol drip for some rate control, accepted to cardiology service, ICU for further care.  Barth Kirks. Sedonia Small, MD [place] [email or web address]  Final Clinical Impressions(s) / ED Diagnoses     ICD-10-CM   1. SVT (supraventricular tachycardia) ([place]) I47.1 DG Chest [facility] 1 View    DG Chest [place] 1 View    ED Discharge Orders    None         Maudie Flakes, MD 01/31/18 [id number]

## 2018-01-30 NOTE — ED Notes (Signed)
Heart rate back up to 133.

## 2018-01-30 NOTE — ED Notes (Signed)
Date and time results received: 01/30/18 2204 (use smartphrase ".now" to insert current time)  Test: toponin Critical Value: 0.09  Name of Provider Notified: Dr. Sedonia Small  Orders Received? Or Actions Taken?: no new orders

## 2018-01-30 NOTE — ED Notes (Signed)
HR back to SVT rate at 155. Pt alert, skin warm and dry.

## 2018-01-31 ENCOUNTER — Ambulatory Visit: Payer: Medicare Other

## 2018-01-31 DIAGNOSIS — I10 Essential (primary) hypertension: Secondary | ICD-10-CM | POA: Diagnosis not present

## 2018-01-31 DIAGNOSIS — K449 Diaphragmatic hernia without obstruction or gangrene: Secondary | ICD-10-CM | POA: Diagnosis not present

## 2018-01-31 DIAGNOSIS — I471 Supraventricular tachycardia: Secondary | ICD-10-CM | POA: Diagnosis not present

## 2018-01-31 DIAGNOSIS — E669 Obesity, unspecified: Secondary | ICD-10-CM | POA: Diagnosis not present

## 2018-01-31 DIAGNOSIS — E785 Hyperlipidemia, unspecified: Secondary | ICD-10-CM | POA: Diagnosis not present

## 2018-01-31 DIAGNOSIS — C8190 Hodgkin lymphoma, unspecified, unspecified site: Secondary | ICD-10-CM | POA: Diagnosis not present

## 2018-01-31 LAB — COMPREHENSIVE METABOLIC PANEL
ALT: 28 U/L (ref 0–44)
AST: 25 U/L (ref 15–41)
Albumin: 2.5 g/dL — ABNORMAL LOW (ref 3.5–5.0)
Alkaline Phosphatase: 111 U/L (ref 38–126)
Anion gap: 11 (ref 5–15)
BUN: 11 mg/dL (ref 8–23)
CO2: 21 mmol/L — ABNORMAL LOW (ref 22–32)
Calcium: 8.6 mg/dL — ABNORMAL LOW (ref 8.9–10.3)
Chloride: 105 mmol/L (ref 98–111)
Creatinine, Ser: 0.83 mg/dL (ref 0.61–1.24)
GFR calc Af Amer: >60 mL/min (ref >60)
GFR calc non Af Amer: >60 mL/min (ref >60)
Glucose, Bld: 110 mg/dL — ABNORMAL HIGH (ref 70–99)
Potassium: 3.9 mmol/L (ref 3.5–5.1)
Sodium: 137 mmol/L (ref 135–145)
TOTAL PROTEIN: 6.1 g/dL — AB (ref 6.5–8.1)
Total Bilirubin: 0.5 mg/dL (ref 0.3–1.2)

## 2018-01-31 LAB — CBC
HCT: 31.2 % — ABNORMAL LOW (ref 39.0–52.0)
Hemoglobin: 9.5 g/dL — ABNORMAL LOW (ref 13.0–17.0)
MCH: 26.8 pg (ref 26.0–34.0)
MCHC: 30.4 g/dL (ref 30.0–36.0)
MCV: 87.9 fL (ref 80.0–100.0)
PLATELETS: 391 10*3/uL (ref 150–400)
RBC: 3.55 MIL/uL — ABNORMAL LOW (ref 4.22–5.81)
RDW: 14.7 % (ref 11.5–15.5)
WBC: 12.8 10*3/uL — ABNORMAL HIGH (ref 4.0–10.5)
nRBC: 0 % (ref 0.0–0.2)

## 2018-01-31 LAB — MRSA PCR SCREENING: MRSA by PCR: NEGATIVE

## 2018-01-31 MED ORDER — ASPIRIN EC 81 MG PO TBEC: 81.0000 mg | DELAYED_RELEASE_TABLET | Freq: Every day | ORAL | Status: DC

## 2018-01-31 MED ORDER — METOPROLOL SUCCINATE ER 50 MG PO TB24: 100.0000 mg | ORAL_TABLET | Freq: Every day | ORAL | Status: DC

## 2018-01-31 MED ORDER — LOSARTAN POTASSIUM 50 MG PO TABS: 50.0000 mg | ORAL_TABLET | Freq: Every day | ORAL | Status: DC

## 2018-01-31 MED ORDER — ENSURE MAX PROTEIN PO LIQD
11.0000 [oz_av] | Freq: Two times a day (BID) | ORAL | Status: DC
  Filled 2018-01-31: qty 330

## 2018-01-31 MED ORDER — SODIUM CHLORIDE 0.9% FLUSH
10.0000 mL | Freq: Two times a day (BID) | INTRAVENOUS | Status: DC
  Administered 2018-01-31: 10 mL

## 2018-01-31 MED ORDER — PRO-STAT SUGAR FREE PO LIQD
30.0000 mL | Freq: Two times a day (BID) | ORAL | Status: DC
  Administered 2018-01-31: 30 mL via ORAL
  Filled 2018-01-31: qty 30

## 2018-01-31 MED ORDER — METOPROLOL SUCCINATE ER 50 MG PO TB24: 100.0000 mg | ORAL_TABLET | Freq: Once | ORAL | Status: AC

## 2018-01-31 MED ORDER — VITAMIN B-12 100 MCG PO TABS: 100.0000 ug | ORAL_TABLET | Freq: Every day | ORAL | Status: DC

## 2018-01-31 MED ORDER — METOPROLOL SUCCINATE ER 50 MG PO TB24
100.0000 mg | ORAL_TABLET | Freq: Every day | ORAL | Status: DC
  Administered 2018-01-31: 100 mg via ORAL
  Filled 2018-01-31: qty 2

## 2018-01-31 MED ORDER — ATORVASTATIN CALCIUM 10 MG PO TABS: 20.0000 mg | ORAL_TABLET | Freq: Every day | ORAL | Status: DC

## 2018-01-31 MED ORDER — METOPROLOL SUCCINATE ER 100 MG PO TB24: 100.0000 mg | ORAL_TABLET | Freq: Two times a day (BID) | ORAL | 3 refills | Status: DC

## 2018-01-31 MED ORDER — SODIUM CHLORIDE 0.9% FLUSH: 10.0000 mL | INTRAVENOUS | Status: DC | PRN

## 2018-01-31 MED ORDER — CHLORHEXIDINE GLUCONATE CLOTH 2 % EX PADS: 6.0000 | MEDICATED_PAD | Freq: Every day | CUTANEOUS | Status: DC

## 2018-01-31 NOTE — Consult Note (Signed)
Cardiology Consultation:   Patient ID: Bradley Novak MRN: [id number]; DOB: August 24, 1950  Admit date: 01/30/2018 Date of Consult: 01/31/2018  Primary Care Provider: Carlena Hurl, PA-C Primary Cardiologist: Mertie Moores, MD  Primary Electrophysiologist:  Curt Bears   Patient Profile:   Bradley Novak is a 68 y.o. male with a hx of SVT, Hodgkin's lymphoma who is being seen today for the evaluation of SVT at the request of [place].  History of Present Illness:   Bradley Novak to the hospital overnight last night with an episode of SVT.  He previously had SVT ablation.  He had weakness, fatigue, and shortness of breath associated with the episode.  He was sitting on the couch and subsequently went into SVT.  The day prior, he had ablation for ORT with a left-sided accessory pathway which was successfully ablated.  The patient was put on an esmolol drip and transferred to the ICU for further work-up.  While on esmolol, the patient went into sinus rhythm and did not have any further episodes of SVT.  Past Medical History:  Diagnosis Date  . History of hiatal hernia   . Hodgkin's lymphoma ([place]) 09/2017  . Hypertension 2006  . Obesity   . Pneumonia 09/2017  . SVT (supraventricular tachycardia) ([place])   . Wears glasses     Past Surgical History:  Procedure Laterality Date  . CARDIAC CATHETERIZATION  1980s  . IR IMAGING GUIDED PORT INSERTION  11/20/2017  . SVT ABLATION  01/29/2018  . SVT ABLATION N/A 01/29/2018   Procedure: SVT ABLATION;  Surgeon: Constance Haw, MD;  Location: [facility];  Service: Cardiovascular;  Laterality: N/A;     Home Medications:  Prior to Admission medications   Medication Sig Start Date End Date Taking? Authorizing Provider  aspirin EC 81 MG tablet Take 1 tablet (81 mg total) by mouth daily. 08/30/17   Tysinger, Camelia Eng, PA-C  atorvastatin (LIPITOR) 20 MG tablet TAKE 1 TABLET BY MOUTH DAILY Patient taking differently: Take 20 mg by mouth  daily.  09/02/17   Tysinger, Camelia Eng, PA-C  carvedilol (COREG) 6.25 MG tablet Take 1 tablet (6.25 mg total) by mouth 2 (two) times daily with a meal. 01/30/18   Baldwin Jamaica, PA-C  Cholecalciferol (VITAMIN D PO) Take 1 capsule by mouth at bedtime.    [provider]  dexamethasone (DECADRON) 4 MG tablet Take 2 tablets by mouth once a day on the day after chemotherapy and then take 2 tablets two times a day for 2 days. Take with food. 11/25/17   [name], MD  diclofenac (VOLTAREN) 75 MG EC tablet Take 75 mg by mouth daily.    [provider]  HYDROcodone-acetaminophen (NORCO) 7.5-325 MG tablet Take 1 tablet by mouth every 6 (six) hours as needed for moderate pain. 01/27/18   Tysinger, Camelia Eng, PA-C  ibuprofen (ADVIL,MOTRIN) 200 MG tablet Take 400 mg by mouth daily as needed for moderate pain.    [provider]  leucovorin (WELLCOVORIN) 15 MG tablet Take 1 tablet (15 mg total) by mouth daily. 01/20/18 02/19/18  [name], MD  LORazepam (ATIVAN) 0.5 MG tablet Take 1 tablet (0.5 mg total) by mouth every 6 (six) hours as needed (Nausea or vomiting). 11/25/17   [name], MD  losartan (COZAAR) 50 MG tablet Take 1 tablet (50 mg total) by mouth daily. 01/30/18   Baldwin Jamaica, PA-C  magnesium oxide (MAG-OX) 400 (241.3 Mg) MG tablet Take 1 tablet (400 mg total)  by mouth 2 (two) times daily. 01/17/18 02/16/18  [name], MD  metoCLOPramide (REGLAN) 10 MG tablet Take 1 tablet (10 mg total) by mouth 3 (three) times daily before meals for 14 days. 01/14/18 01/28/18  [name], MD  ondansetron (ZOFRAN) 8 MG tablet Take 1 tablet (8 mg total) by mouth 2 (two) times daily as needed. Start on the third day after chemotherapy. 11/25/17   [name], MD  polyethylene glycol [facility]) packet Take 17 g by mouth 2 (two) times daily. 01/14/18 02/13/18  [name], MD  potassium chloride SA (K-DUR,KLOR-CON) 20 MEQ tablet Take 1 tablet (20 mEq total) by mouth 2 (two) times daily. 01/20/18 04/20/18  [name],  MD  prochlorperazine (COMPAZINE) 10 MG tablet Take 1 tablet (10 mg total) by mouth every 6 (six) hours as needed (Nausea or vomiting). 11/25/17   [name], MD  ranitidine (ZANTAC) 150 MG tablet Take 150 mg by mouth daily as needed for heartburn.    [provider]  vitamin B-12 (CYANOCOBALAMIN) 100 MCG tablet Take 100 mcg by mouth daily.    [provider]    Inpatient Medications: Scheduled Meds: . aspirin EC  81 mg Oral QHS  . atorvastatin  20 mg Oral q1800  . feeding supplement (PRO-STAT SUGAR FREE 64)  30 mL Oral BID  . losartan  50 mg Oral QHS  . ENSURE MAX PROTEIN  11 oz Oral BID  . vitamin B-12  100 mcg Oral QHS   Continuous Infusions: . sodium chloride 50 mL/hr at 01/30/18 2145   PRN Meds: sodium chloride, acetaminophen, HYDROcodone-acetaminophen, LORazepam, ondansetron (ZOFRAN) IV  Allergies:   No Known Allergies  Social History:   Social History   Socioeconomic History  . Marital status: Married    Spouse name: Not on file  . Number of children: Not on file  . Years of education: Not on file  . Highest education level: Not on file  Occupational History  . Not on file  Social Needs  . Financial resource strain: Not on file  . Food insecurity:    Worry: Not on file    Inability: Not on file  . Transportation needs:    Medical: Not on file    Non-medical: Not on file  Tobacco Use  . Smoking status: Former Smoker    Packs/day: 2.00    Years: 5.00    Pack years: 10.00    Types: Cigarettes    Last attempt to quit: 1974    Years since quitting: 46.0  . Smokeless tobacco: Never Used  Substance and Sexual Activity  . Alcohol use: No  . Drug use: Never  . Sexual activity: Not Currently  Lifestyle  . Physical activity:    Days per week: Not on file    Minutes per session: Not on file  . Stress: Not on file  Relationships  . Social connections:    Talks on phone: Not on file    Gets together: Not on file    Attends religious service:  Not on file    Active member of club or organization: Not on file    Attends meetings of clubs or organizations: Not on file    Relationship status: Not on file  . Intimate partner violence:    Fear of current or ex partner: Not on file    Emotionally abused: Not on file    Physically abused: Not on file    Forced sexual activity: Not on file  Other Topics  Concern  . 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 Hypertension Father   . Hypertension Brother   . Hypertension Brother   . Hypertension Brother   . Hypertension Brother      ROS:  Please see the history of present illness.  None All other ROS reviewed and negative.     Physical Exam/Data:   Vitals:   01/31/18 0830 01/31/18 0900 01/31/18 0930 01/31/18 1000  BP: (!) 157/83 (!) 152/84 (!) 153/82 (!) 144/86  Pulse: 73 68 71 72  Resp: 11 (!) 22 (!) 21 20  Temp:      TempSrc:      SpO2: 94% 95% 95% 98%  Weight:        Intake/Output Summary (Last 24 hours) at 01/31/2018 1151 Last data filed at 01/31/2018 1000 Gross per 24 hour  Intake 1511.39 ml  Output 675 ml  Net 836.39 ml   Last 3 Weights 01/30/2018 01/30/2018 01/29/2018  Weight (lbs) 245 lb 2.4 oz 238 lb 8.6 oz 240 lb  Weight (kg) 111.2 kg 108.2 kg 108.863 kg     Body mass index is 34.19 kg/m.  General:  Well nourished, well developed, in no acute distress HEENT: normal Lymph: no adenopathy Neck: no JVD Endocrine:  No thryomegaly Vascular: No carotid bruits; FA pulses 2+ bilaterally without bruits  Cardiac:  normal S1, S2; RRR; no murmur  Lungs:  clear to auscultation bilaterally, no wheezing, rhonchi or rales  Abd: soft, nontender, no hepatomegaly  Ext: no edema Musculoskeletal:  No deformities, BUE and BLE strength normal and  equal Skin: warm and dry  Neuro:  CNs 2-12 intact, no focal abnormalities noted Psych:  Normal affect   EKG:  The EKG was personally reviewed and demonstrates: Sinus rhythm Telemetry:  Telemetry was personally reviewed and demonstrates: Sinus rhythm with intermittent SVT  Relevant CV Studies: TTE - Left ventricle: The cavity size was normal. Systolic function was   normal. The estimated ejection fraction was in the range of 55%   to 60%. Wall motion was normal; there were no regional wall   motion abnormalities. Left ventricular diastolic function   parameters were normal.  Laboratory Data:  Chemistry Recent Labs  Lab 01/24/18 1220 01/30/18 2117 01/31/18 0602  NA 137 134* 137  K 3.4* 2.9* 3.9  CL 106 103 105  CO2 23 23 21*  GLUCOSE 119* 130* 110*  BUN 11 16 11   CREATININE 1.04 0.73 0.83  CALCIUM 8.5* 8.6* 8.6*  GFRNONAA >60 >60 >60  GFRAA >60 >60 >60  ANIONGAP 8 8 11     Recent Labs  Lab 01/30/18 2117 01/31/18 0602  PROT 6.7 6.1*  ALBUMIN 2.8* 2.5*  AST 30 25  ALT 31 28  ALKPHOS 125 111  BILITOT 0.7 0.5   Hematology Recent Labs  Lab 01/24/18 1220 01/30/18 2117 01/31/18 0602  WBC 13.4* 12.2* 12.8*  RBC 3.30* 3.58* 3.55*  HGB 9.3* 9.7* 9.5*  HCT 28.9* 31.1* 31.2*  MCV 87.6 86.9 87.9  MCH 28.2 27.1 26.8  MCHC 32.2 31.2 30.4  RDW 14.3 14.6 14.7  PLT 352 362 391   Cardiac Enzymes Recent Labs  Lab 01/30/18 2117  TROPONINI 0.09*   No results for input(s): TROPIPOC in  the last 168 hours.  BNPNo results for input(s): BNP, PROBNP in the last 168 hours.  DDimer No results for input(s): DDIMER in the last 168 hours.  Radiology/Studies:  Dg Chest Port 1 View  Result Date: 01/30/2018 CLINICAL DATA:  Supraventricular tachycardia. EXAM: PORTABLE CHEST 1 VIEW COMPARISON:  01/24/2018. FINDINGS: Stable enlarged cardiac silhouette. Interval mild prominence of the pulmonary vasculature and interval increase in prominence of the interstitial markings with a  somewhat patchy appearance in the right lung and left mid and lower lung zones. Possible minimal bilateral pleural fluid. Right jugular porta catheter tip in the inferior aspect of the superior vena cava near the cavoatrial junction. Unremarkable bones. IMPRESSION: Interval changes of congestive heart failure with stable cardiomegaly. Underlying pneumonia or pneumonitis cannot be excluded. Electronically Signed   By: Claudie Revering M.D.   On: 01/30/2018 21:40    Assessment and Plan:   1. SVT: At this point it does appear that his SVT is the same SVT as his prior ablation.  This is curious as his ablation did undergo the 30-minute wait period without recurrences.  He was put on esmolol which converted him to sinus rhythm.  I stopped his esmolol and put him on Toprol-XL which is greatly improved his SVT without further episodes.  We Selia Wareing continue this Toprol-XL at 100 mg twice a day.   For questions or updates, please contact [place] Please consult [email or web address] for contact info under     Signed, Karmon Andis Meredith Leeds, MD  01/31/2018 11:51 AM

## 2018-01-31 NOTE — Progress Notes (Signed)
Discharge instructions reviewed with patient, all questions answered and patient voiced understanding of discharge instructions in his own words.  IV's removed and patient belongings returned to patient.

## 2018-01-31 NOTE — Discharge Instructions (Signed)
Post procedure care instructions (from your procedure on 1/15/12020) No driving for 3 more days. No lifting over 5 lbs for 1 week. No vigorous or sexual activity for 1 week. You may return to work on 02/06/2018 . Keep procedure site clean & dry. If you notice increased pain, swelling, bleeding or pus, call/return!  You may shower, but no soaking baths/hot tubs/pools for 1 week.

## 2018-01-31 NOTE — Discharge Summary (Addendum)
ELECTROPHYSIOLOGY PROCEDURE DISCHARGE SUMMARY    Patient ID: Bradley Novak,  MRN: [id number], DOB/AGE: 67-02-1950 67 y.o.  Admit date: 01/30/2018 Discharge date: 01/31/2018  Primary Care Physician: Carlena Hurl, PA-C  Primary Cardiologist: Dr. Acie Fredrickson Electrophysiologist: Dr. Curt Bears  Primary Discharge Diagnosis:  1. Recurrent SVT  Secondary Discharge Diagnosis:  1. Hodgkin's Lymphoma 2. HTN  No Known Allergies   Procedures This Admission:  none   Brief HPI: Bradley Novak is a 68 y.o. male w/PMHx as noted above.  He had recently developed a persistent SVT, who came in 01/29/18 for EPS/ablation, was discharged yesterday s/p SVT ablation of a orthodromic reentrant tachycardia by Dr. Curt Bears.  The patient reported he did well for several hours at home then developed onset of rapid palpitations in the evening, associated with weakness, noted his HR 150's with BP reported to be >200.  He arrived to [facility] found in narrow complex tachycardia, adenosine 6 and 12 resulted in only brief periods of SR, and started on Esmolol gtt, transferred to [facility] Course:  The patient was admitted to the ICU, his K+ was 2.9 and replaced  EP was consulted, Dr. Curt Bears saw the patient.  In d/w Dr. Curt Bears the patient did not want to pursue repeat ablation, and preferred to go home today.  The esmolol gtt was stopped, given Toprol XL 100mg .  Dr. Curt Bears reviewed his telemetry and after hours off the esmolol gtt has not had recurrent SVT. His potassium corrected to 3.9, in d/w the patient he had not been taking his home potassium for about a week.  He was instructed to resume that and has labs planned for his oncology visit on Monday.  Bradley Novak send him home with Toprol XL 100mg  BID, no losartan for now, he Bradley Novak notify if his has any BP at home routinely greater then 134mmHg.    The patient was examined by Dr. Curt Bears and considered stable for discharge to home.  We revisited his procedure sire  care/activity restrictions.   Physical Exam: Vitals:   01/31/18 0930 01/31/18 1000 01/31/18 1100 01/31/18 1200  BP: (!) 153/82 (!) 144/86    Pulse: 71 72 69 70  Resp: (!) 21 20 16  (!) 23  Temp:      TempSrc:      SpO2: 95% 98% 98% 95%  Weight:         Labs:   Lab Results  Component Value Date   WBC 12.8 (H) 01/31/2018   HGB 9.5 (L) 01/31/2018   HCT 31.2 (L) 01/31/2018   MCV 87.9 01/31/2018   PLT 391 01/31/2018    Recent Labs  Lab 01/31/18 0602  NA 137  K 3.9  CL 105  CO2 21*  BUN 11  CREATININE 0.83  CALCIUM 8.6*  PROT 6.1*  BILITOT 0.5  ALKPHOS 111  ALT 28  AST 25  GLUCOSE 110*    Discharge Medications:  Allergies as of 01/31/2018   No Known Allergies     Medication List    STOP taking these medications   carvedilol 6.25 MG tablet Commonly known as:  COREG   losartan 50 MG tablet Commonly known as:  COZAAR     TAKE these medications   aspirin EC 81 MG tablet Take 1 tablet (81 mg total) by mouth daily.   atorvastatin 20 MG tablet Commonly known as:  LIPITOR TAKE 1 TABLET BY MOUTH DAILY What changed:    how much to take  how to take  this  when to take this  additional instructions   dexamethasone 4 MG tablet Commonly known as:  DECADRON Take 2 tablets by mouth once a day on the day after chemotherapy and then take 2 tablets two times a day for 2 days. Take with food.   diclofenac 75 MG EC tablet Commonly known as:  VOLTAREN Take 75 mg by mouth daily.   HYDROcodone-acetaminophen 7.5-325 MG tablet Commonly known as:  NORCO Take 1 tablet by mouth every 6 (six) hours as needed for moderate pain.   ibuprofen 200 MG tablet Commonly known as:  ADVIL,MOTRIN Take 400 mg by mouth daily as needed for moderate pain.   leucovorin 15 MG tablet Commonly known as:  WELLCOVORIN Take 1 tablet (15 mg total) by mouth daily.   LORazepam 0.5 MG tablet Commonly known as:  ATIVAN Take 1 tablet (0.5 mg total) by mouth every 6 (six) hours as  needed (Nausea or vomiting).   magnesium oxide 400 (241.3 Mg) MG tablet Commonly known as:  MAG-OX Take 1 tablet (400 mg total) by mouth 2 (two) times daily.   metoCLOPramide 10 MG tablet Commonly known as:  REGLAN Take 1 tablet (10 mg total) by mouth 3 (three) times daily before meals for 14 days.   metoprolol succinate 100 MG 24 hr tablet Commonly known as:  TOPROL XL Take 1 tablet (100 mg total) by mouth 2 (two) times daily. Take with or immediately following a meal.   ondansetron 8 MG tablet Commonly known as:  ZOFRAN Take 1 tablet (8 mg total) by mouth 2 (two) times daily as needed. Start on the third day after chemotherapy.   polyethylene glycol packet Commonly known as:  MIRALAX Take 17 g by mouth 2 (two) times daily.   potassium chloride SA 20 MEQ tablet Commonly known as:  K-DUR,KLOR-CON Take 1 tablet (20 mEq total) by mouth 2 (two) times daily.   prochlorperazine 10 MG tablet Commonly known as:  COMPAZINE Take 1 tablet (10 mg total) by mouth every 6 (six) hours as needed (Nausea or vomiting).   ranitidine 150 MG tablet Commonly known as:  ZANTAC Take 150 mg by mouth daily as needed for heartburn.   vitamin B-12 100 MCG tablet Commonly known as:  CYANOCOBALAMIN Take 100 mcg by mouth daily.   VITAMIN D PO Take 1 capsule by mouth at bedtime.       Disposition: Home Discharge Instructions    Diet - low sodium heart healthy   Complete by:  As directed    Increase activity slowly   Complete by:  As directed      Follow-up Information    Constance Haw, MD Follow up.   Specialty:  Cardiology Why:  03/10/2018 @ 3:45PM Contact information: [address] [place] [address] [phone]           Duration of Discharge Encounter: Greater than 30 minutes including physician time.  SignedTommye Standard, PA-C 01/31/2018 1:06 PM   I have seen and examined this patient with Tommye Standard.  Agree with above, note added to reflect my  findings.  On exam, RRR, no murmurs, lungs clear. Patient presented with repeat SVT. Prior ablation. Has been stable on metoprolol and Bradley Novak for discharge with clinic follow up.    Estrellita Lasky M. Lourine Alberico MD 02/01/2018 9:38 AM

## 2018-01-31 NOTE — Progress Notes (Signed)
Initial Nutrition Assessment  DOCUMENTATION CODES:   Severe malnutrition in context of acute illness/injury  INTERVENTION:   - Ensure Max BID (each provides 150 kcal and 30 g protein) - ProStat BID (each provides 100 kcal and 15 g protein) - Continue Vitamin B-12 as ordered   NUTRITION DIAGNOSIS:   Severe Malnutrition related to cancer and cancer related treatments as evidenced by energy intake < or equal to 50% for > or equal to 1 month, mild fat depletion, mild muscle depletion, percent weight loss.   GOAL:   Patient will meet greater than or equal to 90% of their needs  MONITOR:   PO intake, Supplement acceptance, Labs, Weight trends  REASON FOR ASSESSMENT:   Malnutrition Screening Tool    ASSESSMENT:   68 yo male, admitted with supraventricular tachycardia. PMH significant for Hodgkin's lymphoma under active treatment, HTN, recent ablation procedure (1/15), hiatal hernia. Home meds include Lipitor, cholecalciferol, Mag-Ox, Reglan, Miralax, KCl, Zantac, vitamin B-12.  Labs: glucose 110, tProtein 6.1, Hgb 9.5, Hct 31.2% Meds: Liptior, vitamin B-12  Pt resting in bed with family present at time of visit.  Pt endorses poor appetite and poor intake since 12/24. Pt began chemotherapy Nov 15, 2017. At home, pt follows a diet similar to a neutropenic diet (no deli meat, raw vegetables, no spicy food, no fresh fruit with peels, no undercooked meats).   Pt is experiences taste changes d/t chemo and struggles with knowing he is not meeting his needs.  Family reports a 25-30# wt loss since 11/1 --> 11% wt loss over 2 months is clinically significant.   Pt denies nausea or vomiting, diarrhea or constipation, or difficulty chewing or swallowing.   Discussed experimenting with different flavor profiles and trying to find foods that taste alright. Discussed strategies to incorporate more protein into diet (protein-fortified pancakes). Encouraged pt and family to keep up the good  work in spite of the struggle. Pt drinks Premier protein at home - will order here. Pt amenable to trying ProStat BID, and suggested drinking it like a shot of medicine.    NUTRITION - FOCUSED PHYSICAL EXAM:   Most Recent Value  Orbital Region  Mild depletion  Upper Arm Region  Mild depletion  Thoracic and Lumbar Region  No depletion  Buccal Region  No depletion  Temple Region  No depletion  Clavicle Bone Region  No depletion  Clavicle and Acromion Bone Region  No depletion  Scapular Bone Region  Mild depletion  Dorsal Hand  No depletion  Patellar Region  No depletion  Anterior Thigh Region  No depletion  Posterior Calf Region  Mild depletion  Edema (RD Assessment)  Unable to assess  Hair  Other (Comment) thinning d/t chemo  Eyes  Reviewed  Mouth  Reviewed  Skin  Reviewed  Nails  Reviewed      Diet Order:  No intake documented, per nsg Diet Order            Diet Heart Room service appropriate? Yes; Fluid consistency: Thin  Diet effective now              EDUCATION NEEDS:  Education needs have been addressed  Skin:  Skin Assessment: Reviewed RN Assessment  Last BM:  1/15  Height:  Ht Readings from Last 1 Encounters:  01/29/18 5\' 11"  (1.803 m)    Weight:  Wt Readings from Last 1 Encounters:  01/30/18 111.2 kg    Ideal Body Weight:  78.1 kg  BMI:  Body mass index  is 34.19 kg/m.  Estimated Nutritional Needs:   Kcal:  1557-2002 calories daily (14-18 kcal/kg ABW)  Protein:  78-102 gm daily (1.0-1.3 g/kg IBW)  Fluid:  1 mL/kg or per MD discretion  Althea Grimmer, MS, RDN, LDN Pager: [phone] Available Mondays and Fridays, 9am-2pm

## 2018-02-03 ENCOUNTER — Inpatient Hospital Stay: Payer: Medicare Other

## 2018-02-03 ENCOUNTER — Other Ambulatory Visit: Payer: Medicare Other

## 2018-02-03 ENCOUNTER — Encounter: Payer: Self-pay | Admitting: Hematology

## 2018-02-03 ENCOUNTER — Ambulatory Visit: Payer: Medicare Other | Admitting: Hematology

## 2018-02-03 ENCOUNTER — Ambulatory Visit: Payer: Medicare Other

## 2018-02-03 ENCOUNTER — Ambulatory Visit: Payer: Medicare Other | Admitting: Hematology & Oncology

## 2018-02-03 ENCOUNTER — Inpatient Hospital Stay (HOSPITAL_BASED_OUTPATIENT_CLINIC_OR_DEPARTMENT_OTHER): Payer: Medicare Other | Admitting: Hematology

## 2018-02-03 VITALS — BP 124/78 | HR 59 | Temp 97.8°F | Resp 19 | Ht 71.0 in | Wt 241.0 lb

## 2018-02-03 DIAGNOSIS — T451X5A Adverse effect of antineoplastic and immunosuppressive drugs, initial encounter: Secondary | ICD-10-CM

## 2018-02-03 DIAGNOSIS — K219 Gastro-esophageal reflux disease without esophagitis: Secondary | ICD-10-CM | POA: Insufficient documentation

## 2018-02-03 DIAGNOSIS — R05 Cough: Secondary | ICD-10-CM | POA: Diagnosis not present

## 2018-02-03 DIAGNOSIS — C8190 Hodgkin lymphoma, unspecified, unspecified site: Secondary | ICD-10-CM

## 2018-02-03 DIAGNOSIS — E46 Unspecified protein-calorie malnutrition: Secondary | ICD-10-CM | POA: Diagnosis not present

## 2018-02-03 DIAGNOSIS — D6481 Anemia due to antineoplastic chemotherapy: Secondary | ICD-10-CM | POA: Diagnosis not present

## 2018-02-03 DIAGNOSIS — D709 Neutropenia, unspecified: Secondary | ICD-10-CM

## 2018-02-03 DIAGNOSIS — Z5111 Encounter for antineoplastic chemotherapy: Secondary | ICD-10-CM

## 2018-02-03 DIAGNOSIS — Z79899 Other long term (current) drug therapy: Secondary | ICD-10-CM | POA: Diagnosis not present

## 2018-02-03 DIAGNOSIS — E860 Dehydration: Secondary | ICD-10-CM

## 2018-02-03 DIAGNOSIS — E876 Hypokalemia: Secondary | ICD-10-CM | POA: Diagnosis not present

## 2018-02-03 DIAGNOSIS — C8198 Hodgkin lymphoma, unspecified, lymph nodes of multiple sites: Secondary | ICD-10-CM | POA: Diagnosis not present

## 2018-02-03 DIAGNOSIS — I2510 Atherosclerotic heart disease of native coronary artery without angina pectoris: Secondary | ICD-10-CM | POA: Diagnosis not present

## 2018-02-03 DIAGNOSIS — I471 Supraventricular tachycardia, unspecified: Secondary | ICD-10-CM

## 2018-02-03 DIAGNOSIS — R109 Unspecified abdominal pain: Secondary | ICD-10-CM | POA: Diagnosis not present

## 2018-02-03 DIAGNOSIS — K5900 Constipation, unspecified: Secondary | ICD-10-CM | POA: Diagnosis not present

## 2018-02-03 DIAGNOSIS — R059 Cough, unspecified: Secondary | ICD-10-CM

## 2018-02-03 DIAGNOSIS — I700 Atherosclerosis of aorta: Secondary | ICD-10-CM | POA: Diagnosis not present

## 2018-02-03 DIAGNOSIS — D72829 Elevated white blood cell count, unspecified: Secondary | ICD-10-CM

## 2018-02-03 LAB — CMP (CANCER CENTER ONLY)
ALT: 19 U/L (ref 0–44)
AST: 18 U/L (ref 15–41)
Albumin: 3.7 g/dL (ref 3.5–5.0)
Alkaline Phosphatase: 120 U/L (ref 38–126)
Anion gap: 8 (ref 5–15)
BUN: 18 mg/dL (ref 8–23)
CO2: 25 mmol/L (ref 22–32)
CREATININE: 0.86 mg/dL (ref 0.61–1.24)
Calcium: 9.5 mg/dL (ref 8.9–10.3)
Chloride: 102 mmol/L (ref 98–111)
GFR, Est AFR Am: >60 mL/min (ref >60)
GFR, Estimated: >60 mL/min (ref >60)
Glucose, Bld: 102 mg/dL — ABNORMAL HIGH (ref 70–99)
Potassium: 4.4 mmol/L (ref 3.5–5.1)
Sodium: 135 mmol/L (ref 135–145)
Total Bilirubin: 0.6 mg/dL (ref 0.3–1.2)
Total Protein: 6.9 g/dL (ref 6.5–8.1)

## 2018-02-03 LAB — CBC WITH DIFFERENTIAL (CANCER CENTER ONLY)
Abs Immature Granulocytes: 0.14 10*3/uL — ABNORMAL HIGH (ref 0.00–0.07)
Basophils Absolute: 0.1 10*3/uL (ref 0.0–0.1)
Basophils Relative: 1 %
Eosinophils Absolute: 0.2 10*3/uL (ref 0.0–0.5)
Eosinophils Relative: 1 %
HCT: 33.4 % — ABNORMAL LOW (ref 39.0–52.0)
Hemoglobin: 10.6 g/dL — ABNORMAL LOW (ref 13.0–17.0)
Immature Granulocytes: 1 %
Lymphocytes Relative: 10 %
Lymphs Abs: 1.4 10*3/uL (ref 0.7–4.0)
MCH: 27.8 pg (ref 26.0–34.0)
MCHC: 31.7 g/dL (ref 30.0–36.0)
MCV: 87.7 fL (ref 80.0–100.0)
Monocytes Absolute: 1.1 10*3/uL — ABNORMAL HIGH (ref 0.1–1.0)
Monocytes Relative: 8 %
Neutro Abs: 10.6 10*3/uL — ABNORMAL HIGH (ref 1.7–7.7)
Neutrophils Relative %: 79 %
Platelet Count: 394 10*3/uL (ref 150–400)
RBC: 3.81 MIL/uL — ABNORMAL LOW (ref 4.22–5.81)
RDW: 15.4 % (ref 11.5–15.5)
WBC Count: 13.5 10*3/uL — ABNORMAL HIGH (ref 4.0–10.5)
nRBC: 0 % (ref 0.0–0.2)

## 2018-02-03 LAB — MAGNESIUM: Magnesium: 2 mg/dL (ref 1.7–2.4)

## 2018-02-03 MED ORDER — STERILE WATER FOR INJECTION IJ SOLN
INTRAMUSCULAR | Status: AC
  Filled 2018-02-03: qty 10

## 2018-02-03 MED ORDER — ESOMEPRAZOLE MAGNESIUM 20 MG PO CPDR: 20.0000 mg | DELAYED_RELEASE_CAPSULE | Freq: Every day | ORAL | 2 refills | Status: DC

## 2018-02-03 MED ORDER — ALTEPLASE 2 MG IJ SOLR
2.0000 mg | Freq: Once | INTRAMUSCULAR | Status: AC | PRN
  Administered 2018-02-03: 2 mg
  Filled 2018-02-03: qty 2

## 2018-02-03 MED ORDER — ALTEPLASE 2 MG IJ SOLR
INTRAMUSCULAR | Status: AC
  Filled 2018-02-03: qty 2

## 2018-02-03 NOTE — Progress Notes (Signed)
[facility] OFFICE PROGRESS NOTE  Patient Care Team: Tysinger, Camelia Eng, PA-C as PCP - General (Family Medicine) Nahser, Wonda Cheng, MD as PCP - Cardiology (Cardiology)  HEME/ONC OVERVIEW: 1. Stage III classic Hodgkin lymphoma, subtype NOS due to limited specimen -Bx-proven R cervical LN involvement in 09/2017; PET in 11/2017 showed cervical, mediastinal and upper abdominal LN involement; treatment delayed due to pt traveling on a cruise after diagnosis -11/2017 - present: ABVD -12/2017: PET after 2 cycles of ABVD showed interval CR  -Chemotherapy delayed due to SVT requiring ablation in 01/2018 -Mid-01/2018 - present: AVD   2. Port placement in 11/2017   CHEMOTHERAPY REGIMEN:  11/26/2017 - present: ABVD x 2 cycles, CR; plan for AVD x 4 cycles  ASSESSMENT & PLAN:   Stage III classic Hodgkin lymphoma, favorable risk  -I independently reviewed the radiologic images of recent PET, and agree the findings as documented -In summary, PET showed interval complete response after 2 cycles of ABVD -Given the excellent response, we will de-escalate chemotherapy to AVD for 4 more cycles  -C3D1 chemo scheduled on 02/04/2018; labs pending today  -Given the recent severe dehydration, we will plan to administer daily IV fluid x 5 days, starting one week after each chemotherapy treatment, to maintain hydration  -I also discussed at length with the patient regarding the duration of AVD; patient had significant reservations about 4 cycles of AVD -We reviewed the NCCN guideline; the patient ultimately agreed to resume treatment, but the total number of treatment TBD based on his tolerance -PRN anti-emetics: Zofran, Compazine, dexamethasone and Ativan  Malnutrition -Patient required daily IV fluid for 2 weeks due to dehydration and malnutrition after Cycle 2 of ABVD with improvement in functional status -We will plan to provide supportive IV hydration daily x 1 week, starting one week after  each chemotherapy treatment, to maintain hydration -I encouraged the patient to continue nutritional supplement to maintain weight   Constipation  -Bowel regimen: Sennokot, Miralax, and PRN lactulose  -No improvement with a trial of Reglan; discontinued   Intermittent dry cough -Overall stable; exacerbated at night while lying flat, possibly suggesting reflux  -Recent PET/CT did not show any evidence of lung injury; CXR's in 01/2018 did not show any clear abnormalities  -I encourage the patient to start a trial of Nexium and see if his symptoms improve  -If cough doesn't improve, we will obtain CT chest to rule out any bleomycin pulmonary toxicities   Leukocytosis  -WBC 12.8k, stable -Patient denies any symptoms of infection   -The patient was counseled on any concerning symptoms, such as fever (temperature > 100.4), for which he should contact the clinic promptly  Chemotherapy-associated anemia -Hgb 9.5, stable -Patient denies any symptoms of bleeding -We will continue to monitor it for now  SVT -S/p ablation in mid-01/2018 -Patient is currently asymptomatic -Continue metoprolol BID -I encourage the patient to follow up with cardiology for further management   No orders of the defined types were placed in this encounter.  All questions were answered. The patient knows to call the clinic with any problems, questions or concerns. No barriers to learning was detected.  Return on 02/17/2018 for labs and clinic follow-up prior to C3D15 of AVD.   [name], MD 02/03/2018 11:39 AM  CHIEF COMPLAINT: "I am doing a little better"  INTERVAL HISTORY: [name] returns to clinic for follow-up of Hodgkin lymphoma prior to cycle 3-day 1 of AVD.  Patient underwent ablation of aberrant conduction for SVT  last week, and had to be reevaluated in the ER for recurrent palpitations.  Per patient, the cardiology thought the recurrent palpitations post ablation might be due to transient tachycardia  post ablation, and the recommended to continue metoprolol for now.  Patient reports that since then, he has not had any recurrent palpitation, chest pain, dyspnea, or diaphoresis.  He feels that his energy level is slowly improving, and his appetite is also getting little bit better.  He is still having intermittent dry cough, worse at night while lying flat, for which he recently saw his PCP, who thought that that might be due to acid reflux.  He takes as needed Zantac, but is not on a regimen of PPI.  He denies any fever, chill, night sweats, lymphadenopathy, abdominal pain, nausea, vomiting, diarrhea, or abnormal bleeding/bruising.  SUMMARY OF ONCOLOGIC HISTORY:   Hodgkin lymphoma ([place])   08/29/2017 Initial Diagnosis    Hodgkin lymphoma ([place])    10/07/2017 Imaging    CT neck w/ contrast: Enlarged nodes in the right jugular chain, posterior triangle, and supraclavicular fossa. The pattern is most concerning for lymphoma; multiple nodes are superficial and amenable to biopsy.  CT CAP w/ contrast: 1. Right supraclavicular and subcarinal adenopathy is identified. Differential considerations include lymphoproliferative disorder, metastatic adenopathy, granulomatous inflammation/infection or reactive adenopathy. Consider further evaluation with tissue sampling. 2. No acute findings, mass or adenopathy within the abdomen. 3. Lungs are clear.  No evidence for pneumonia.  4.  Aortic Atherosclerosis (ICD10-I70.0).    10/25/2017 Procedure    US-guided R cervical LN biopsy    10/25/2017 Pathology Results    (Accession: [id number]) Lymph node, needle/core biopsy, Right Cervical - CLASSIC HODGKIN LYMPHOMA - SEE COMMENT - CD30 (1%) - Unable to subtype due to limited sample specimen    11/12/2017 Cancer Staging    Staging form: Hodgkin and Non-Hodgkin Lymphoma, AJCC 8th Edition - Clinical stage from 11/12/2017: Stage II (Hodgkin lymphoma, A - Asymptomatic) - Signed by [name], MD on 11/12/2017     11/21/2017 -  Chemotherapy    The patient had DOXOrubicin (ADRIAMYCIN) chemo injection 60 mg, 25 mg/m2 = 60 mg, Intravenous,  Once, 0 of 4 cycles palonosetron (ALOXI) injection 0.25 mg, 0.25 mg, Intravenous,  Once, 0 of 4 cycles bleomycin (BLEOCIN) 24 Units in sodium chloride 0.9 % 50 mL chemo infusion, 10 Units/m2 = 24 Units, Intravenous,  Once, 0 of 4 cycles dacarbazine (DTIC) 910 mg in sodium chloride 0.9 % 250 mL chemo infusion, 375 mg/m2 = 910 mg, Intravenous,  Once, 0 of 4 cycles vinBLAStine (VELBAN) 14.6 mg in sodium chloride 0.9 % 50 mL chemo infusion, 6 mg/m2 = 14.6 mg, Intravenous, Once, 0 of 4 cycles fosaprepitant (EMEND) 150 mg, dexamethasone (DECADRON) 12 mg in sodium chloride 0.9 % 145 mL IVPB, , Intravenous,  Once, 0 of 4 cycles  for chemotherapy treatment.     11/21/2017 Imaging    PET: IMPRESSION: 1. Multiple relatively small hypermetabolic lymph nodes in the RIGHT neck, mediastinum and limited involvement of the upper abdomen. Lymph node activity is high (greater than liver, Deauville 4). 2. The most intense enlarged lymph node is anterior to the sternocleidomastoid muscle on the RIGHT. There are multiple assessable lymph nodes in the RIGHT cervical chain. 3. Minimal hypermetabolic adenopathy in the upper abdomen. No pelvic or inguinal metastatic adenopathy. 4. Normal spleen and bone marrow. 5. Single focus of metabolic activity in the sigmoid colon. Recommend screening colonoscopy if patient is not current for such.  01/10/2018 Imaging    PET (after 2 cycles of ABVD) IMPRESSION: 1. Interval complete response to therapy. No abnormal areas of persistent hypermetabolic adenopathy identified. 2.  Aortic Atherosclerosis (ICD10-I70.0). 3. Multi vessel coronary artery atherosclerotic calcifications.     REVIEW OF SYSTEMS:   Constitutional: ( - ) fevers, ( - )  chills , ( - ) night sweats Eyes: ( - ) blurriness of vision, ( - ) double vision, ( - ) watery eyes Ears, nose,  mouth, throat, and face: ( - ) mucositis, ( - ) sore throat Respiratory: ( - ) cough, ( - ) dyspnea, ( - ) wheezes Cardiovascular: ( - ) palpitation, ( - ) chest discomfort, ( - ) lower extremity swelling Gastrointestinal:  ( - ) nausea, ( - ) heartburn, ( - ) change in bowel habits Skin: ( - ) abnormal skin rashes Lymphatics: ( - ) new lymphadenopathy, ( - ) easy bruising Neurological: ( - ) numbness, ( - ) tingling, ( - ) new weaknesses Behavioral/Psych: ( - ) mood change, ( - ) new changes  All other systems were reviewed with the patient and are negative.  I have reviewed the past medical history, past surgical history, social history and family history with the patient and they are unchanged from previous note.  ALLERGIES:  has No Known Allergies.  MEDICATIONS:  Current Outpatient Medications  Medication Sig Dispense Refill  . aspirin EC 81 MG tablet Take 1 tablet (81 mg total) by mouth daily. 90 tablet 3  . atorvastatin (LIPITOR) 20 MG tablet TAKE 1 TABLET BY MOUTH DAILY (Patient taking differently: Take 20 mg by mouth daily. ) 90 tablet 2  . Cholecalciferol (VITAMIN D PO) Take 1 capsule by mouth at bedtime.    Marland Kitchen dexamethasone (DECADRON) 4 MG tablet Take 2 tablets by mouth once a day on the day after chemotherapy and then take 2 tablets two times a day for 2 days. Take with food. 30 tablet 1  . diclofenac (VOLTAREN) 75 MG EC tablet Take 75 mg by mouth daily.    Marland Kitchen HYDROcodone-acetaminophen (NORCO) 7.5-325 MG tablet Take 1 tablet by mouth every 6 (six) hours as needed for moderate pain. 10 tablet 0  . ibuprofen (ADVIL,MOTRIN) 200 MG tablet Take 400 mg by mouth daily as needed for moderate pain.    Marland Kitchen leucovorin (WELLCOVORIN) 15 MG tablet Take 1 tablet (15 mg total) by mouth daily. 30 tablet 5  . LORazepam (ATIVAN) 0.5 MG tablet Take 1 tablet (0.5 mg total) by mouth every 6 (six) hours as needed (Nausea or vomiting). 30 tablet 0  . magnesium oxide (MAG-OX) 400 (241.3 Mg) MG tablet Take 1  tablet (400 mg total) by mouth 2 (two) times daily. 60 tablet 4  . metoCLOPramide (REGLAN) 10 MG tablet Take 1 tablet (10 mg total) by mouth 3 (three) times daily before meals for 14 days. 42 tablet 0  . metoprolol succinate (TOPROL XL) 100 MG 24 hr tablet Take 1 tablet (100 mg total) by mouth 2 (two) times daily. Take with or immediately following a meal. 180 tablet 3  . ondansetron (ZOFRAN) 8 MG tablet Take 1 tablet (8 mg total) by mouth 2 (two) times daily as needed. Start on the third day after chemotherapy. 30 tablet 1  . polyethylene glycol (MIRALAX) packet Take 17 g by mouth 2 (two) times daily. 14 each 5  . potassium chloride SA (K-DUR,KLOR-CON) 20 MEQ tablet Take 1 tablet (20 mEq total) by mouth 2 (two) times  daily. 180 tablet 0  . prochlorperazine (COMPAZINE) 10 MG tablet Take 1 tablet (10 mg total) by mouth every 6 (six) hours as needed (Nausea or vomiting). 30 tablet 1  . ranitidine (ZANTAC) 150 MG tablet Take 150 mg by mouth daily as needed for heartburn.    . vitamin B-12 (CYANOCOBALAMIN) 100 MCG tablet Take 100 mcg by mouth daily.     No current facility-administered medications for this visit.     PHYSICAL EXAMINATION: ECOG PERFORMANCE STATUS: 1 - Symptomatic but completely ambulatory  Today's Vitals   02/03/18 1111  BP: 124/78  Pulse: (!) 59  Resp: 19  Temp: 97.8 F (36.6 C)  TempSrc: Oral  SpO2: 98%  Weight: 241 lb (109.3 kg)  Height: 5\' 11"  (1.803 m)   Body mass index is 33.61 kg/m.  Filed Weights   02/03/18 1111  Weight: 241 lb (109.3 kg)    GENERAL: alert, no distress and comfortable SKIN: skin color, texture, turgor are normal, no rashes or significant lesions EYES: conjunctiva are pink and non-injected, sclera clear OROPHARYNX: no exudate, no erythema; lips, buccal mucosa, and tongue normal  NECK: supple, non-tender LYMPH:  no palpable lymphadenopathy in the cervical LUNGS: clear to auscultation and percussion with normal breathing effort HEART:  regular rate & rhythm and no murmurs and no lower extremity edema ABDOMEN: soft, non-tender, non-distended, normal bowel sounds Musculoskeletal: no cyanosis of digits and no clubbing  PSYCH: alert & oriented x 3, fluent speech NEURO: no focal motor/sensory deficits  LABORATORY DATA:  I have reviewed the data as listed    Component Value Date/Time   NA 137 01/31/2018 0602   NA 141 08/29/2017 1155   K 3.9 01/31/2018 0602   CL 105 01/31/2018 0602   CO2 21 (L) 01/31/2018 0602   GLUCOSE 110 (H) 01/31/2018 0602   BUN 11 01/31/2018 0602   BUN 16 08/29/2017 1155   CREATININE 0.83 01/31/2018 0602   CREATININE 1.02 01/20/2018 0950   CREATININE 0.83 09/27/2014 0001   CALCIUM 8.6 (L) 01/31/2018 0602   PROT 6.1 (L) 01/31/2018 0602   PROT 7.4 08/29/2017 1155   ALBUMIN 2.5 (L) 01/31/2018 0602   ALBUMIN 4.1 08/29/2017 1155   AST 25 01/31/2018 0602   AST 19 01/20/2018 0950   ALT 28 01/31/2018 0602   ALT 27 01/20/2018 0950   ALKPHOS 111 01/31/2018 0602   BILITOT 0.5 01/31/2018 0602   BILITOT 0.8 01/20/2018 0950   GFRNONAA >60 01/31/2018 0602   GFRNONAA >60 01/20/2018 0950   GFRAA >60 01/31/2018 0602   GFRAA >60 01/20/2018 0950    No results found for: SPEP, UPEP  Lab Results  Component Value Date   WBC 12.8 (H) 01/31/2018   NEUTROABS 9.9 (H) 01/24/2018   HGB 9.5 (L) 01/31/2018   HCT 31.2 (L) 01/31/2018   MCV 87.9 01/31/2018   PLT 391 01/31/2018      Chemistry      Component Value Date/Time   NA 137 01/31/2018 0602   NA 141 08/29/2017 1155   K 3.9 01/31/2018 0602   CL 105 01/31/2018 0602   CO2 21 (L) 01/31/2018 0602   BUN 11 01/31/2018 0602   BUN 16 08/29/2017 1155   CREATININE 0.83 01/31/2018 0602   CREATININE 1.02 01/20/2018 0950   CREATININE 0.83 09/27/2014 0001      Component Value Date/Time   CALCIUM 8.6 (L) 01/31/2018 0602   ALKPHOS 111 01/31/2018 0602   AST 25 01/31/2018 0602   AST 19 01/20/2018 0950  ALT 28 01/31/2018 0602   ALT 27 01/20/2018 0950    BILITOT 0.5 01/31/2018 0602   BILITOT 0.8 01/20/2018 0950       RADIOGRAPHIC STUDIES: I have personally reviewed the radiological images as listed below and agreed with the findings in the report. Nm Pet Image Restag (ps) Skull Base To Thigh  Result Date: 01/10/2018 CLINICAL DATA:  Subsequent treatment strategy for lymphoma. EXAM: NUCLEAR MEDICINE PET SKULL BASE TO THIGH TECHNIQUE: 12.5 mCi F-18 FDG was injected intravenously. Full-ring PET imaging was performed from the skull base to thigh after the radiotracer. CT data was obtained and used for attenuation correction and anatomic localization. Fasting blood glucose: 124 mg/dl COMPARISON:  11/21/2017 FINDINGS: Mediastinal blood pool activity: SUV max 3.31 Liver activity: SUV max equals 4.88 NECK: Previous hypermetabolic right level 2 lymph node has an SUV max of 2.80, Deauville criteria 2. Previous hypermetabolic right posterior cervical triangle lymph nodes have an SUV max of 1.59, Deauville criteria 2. Previously referenced hypermetabolic right supraclavicular lymph node has resolved in the interval. Incidental CT findings: None CHEST: No hypermetabolic axillary, mediastinal, or hilar lymph nodes. Index subcarinal lymph node has an SUV max of 2.51, Deauville criteria 2. No hypermetabolic pulmonary nodules. Incidental CT findings: Aortic atherosclerosis. Calcifications within the LAD, left circumflex and RCA coronary arteries noted. ABDOMEN/PELVIS: No abnormal hypermetabolic activity within the liver, pancreas, adrenal glands, or spleen. No hypermetabolic lymph nodes in the abdomen or pelvis. The spleen measures 10.3 x 4.7 x 13.0 cm (volume = 330 cm^3). No abnormal uptake identified within the spleen, which has an SUV max is equal to 3.27. Incidental CT findings: none SKELETON: No focal hypermetabolic activity to suggest skeletal metastasis. Incidental CT findings: none IMPRESSION: 1. Interval complete response to therapy. No abnormal areas of  persistent hypermetabolic adenopathy identified. 2.  Aortic Atherosclerosis (ICD10-I70.0). 3. Multi vessel coronary artery atherosclerotic calcifications. Electronically Signed   By: Kerby Moors M.D.   On: 01/10/2018 12:29   Dg Chest Port 1 View  Result Date: 01/30/2018 CLINICAL DATA:  Supraventricular tachycardia. EXAM: PORTABLE CHEST 1 VIEW COMPARISON:  01/24/2018. FINDINGS: Stable enlarged cardiac silhouette. Interval mild prominence of the pulmonary vasculature and interval increase in prominence of the interstitial markings with a somewhat patchy appearance in the right lung and left mid and lower lung zones. Possible minimal bilateral pleural fluid. Right jugular porta catheter tip in the inferior aspect of the superior vena cava near the cavoatrial junction. Unremarkable bones. IMPRESSION: Interval changes of congestive heart failure with stable cardiomegaly. Underlying pneumonia or pneumonitis cannot be excluded. Electronically Signed   By: Claudie Revering M.D.   On: 01/30/2018 21:40   Dg Chest Port 1 View  Result Date: 01/24/2018 CLINICAL DATA:  Supraventricular tachycardia. EXAM: PORTABLE CHEST 1 VIEW COMPARISON:  01/21/2018 FINDINGS: Chronic cardiomegaly and vascular pedicle widening. Porta catheter from the left with tip at the SVC. Interstitial coarsening that is stable to improved from prior. There is no consolidation, effusion, or pneumothorax. IMPRESSION: 1. Stable to improved appearance of the chest compared to 3 days ago. 2. Cardiomegaly, low volumes, and generalized interstitial opacity. Electronically Signed   By: Monte Fantasia M.D.   On: 01/24/2018 11:48   Dg Chest Portable 1 View  Result Date: 01/21/2018 CLINICAL DATA:  Chest pain. Tachycardia. History of lymphoma. EXAM: PORTABLE CHEST 1 VIEW COMPARISON:  Chest radiographs 08/29/2017. PET-CT 01/10/2018. FINDINGS: A left jugular Port-A-Cath terminates near the superior cavoatrial junction. The cardiac silhouette is normal in size.  Lung volumes are  low with increased interstitial markings diffusely throughout both lungs. No sizable pleural effusion or pneumothorax is identified. No acute osseous abnormality is seen. IMPRESSION: Low lung volumes with increased interstitial markings throughout both lungs, which could reflect atypical infection or edema. Electronically Signed   By: Logan Bores M.D.   On: 01/21/2018 13:12

## 2018-02-03 NOTE — Patient Instructions (Signed)

## 2018-02-04 ENCOUNTER — Other Ambulatory Visit: Payer: Self-pay

## 2018-02-04 ENCOUNTER — Inpatient Hospital Stay: Payer: Medicare Other

## 2018-02-04 ENCOUNTER — Ambulatory Visit: Payer: Medicare Other

## 2018-02-04 ENCOUNTER — Other Ambulatory Visit: Payer: Self-pay | Admitting: Hematology

## 2018-02-04 VITALS — BP 144/78 | HR 62 | Resp 19

## 2018-02-04 DIAGNOSIS — C8198 Hodgkin lymphoma, unspecified, lymph nodes of multiple sites: Secondary | ICD-10-CM | POA: Diagnosis not present

## 2018-02-04 DIAGNOSIS — K5900 Constipation, unspecified: Secondary | ICD-10-CM | POA: Diagnosis not present

## 2018-02-04 DIAGNOSIS — C8190 Hodgkin lymphoma, unspecified, unspecified site: Secondary | ICD-10-CM

## 2018-02-04 DIAGNOSIS — I471 Supraventricular tachycardia: Secondary | ICD-10-CM | POA: Diagnosis not present

## 2018-02-04 DIAGNOSIS — Z5111 Encounter for antineoplastic chemotherapy: Secondary | ICD-10-CM | POA: Diagnosis not present

## 2018-02-04 DIAGNOSIS — E46 Unspecified protein-calorie malnutrition: Secondary | ICD-10-CM | POA: Diagnosis not present

## 2018-02-04 DIAGNOSIS — E860 Dehydration: Secondary | ICD-10-CM | POA: Diagnosis not present

## 2018-02-04 MED ORDER — PALONOSETRON HCL INJECTION 0.25 MG/5ML
INTRAVENOUS | Status: AC
  Filled 2018-02-04: qty 5

## 2018-02-04 MED ORDER — SODIUM CHLORIDE 0.9 % IV SOLN
Freq: Once | INTRAVENOUS | Status: AC
  Administered 2018-02-04: 11:00:00 via INTRAVENOUS
  Filled 2018-02-04: qty 5

## 2018-02-04 MED ORDER — SODIUM CHLORIDE 0.9% FLUSH
10.0000 mL | INTRAVENOUS | Status: DC | PRN
  Administered 2018-02-04: 10 mL
  Filled 2018-02-04: qty 10

## 2018-02-04 MED ORDER — HEPARIN SOD (PORK) LOCK FLUSH 100 UNIT/ML IV SOLN
500.0000 [IU] | Freq: Once | INTRAVENOUS | Status: DC | PRN
  Filled 2018-02-04: qty 5

## 2018-02-04 MED ORDER — PALONOSETRON HCL INJECTION 0.25 MG/5ML
0.2500 mg | Freq: Once | INTRAVENOUS | Status: AC
  Administered 2018-02-04: 0.25 mg via INTRAVENOUS

## 2018-02-04 MED ORDER — VINBLASTINE SULFATE CHEMO INJECTION 1 MG/ML
15.0000 mg | Freq: Once | INTRAVENOUS | Status: AC
  Administered 2018-02-04: 15 mg via INTRAVENOUS
  Filled 2018-02-04: qty 15

## 2018-02-04 MED ORDER — SODIUM CHLORIDE 0.9% FLUSH
10.0000 mL | Freq: Once | INTRAVENOUS | Status: DC | PRN
  Filled 2018-02-04: qty 10

## 2018-02-04 MED ORDER — SODIUM CHLORIDE 0.9 % IV SOLN
370.0000 mg/m2 | Freq: Once | INTRAVENOUS | Status: AC
  Administered 2018-02-04: 900 mg via INTRAVENOUS
  Filled 2018-02-04: qty 90

## 2018-02-04 MED ORDER — DOXORUBICIN HCL CHEMO IV INJECTION 2 MG/ML
25.0000 mg/m2 | Freq: Once | INTRAVENOUS | Status: AC
  Administered 2018-02-04: 60 mg via INTRAVENOUS
  Filled 2018-02-04: qty 30

## 2018-02-04 MED ORDER — SODIUM CHLORIDE 0.9 % IV SOLN
Freq: Once | INTRAVENOUS | Status: AC
  Administered 2018-02-04: 10:00:00 via INTRAVENOUS
  Filled 2018-02-04: qty 250

## 2018-02-04 MED ORDER — HEPARIN SOD (PORK) LOCK FLUSH 100 UNIT/ML IV SOLN
500.0000 [IU] | Freq: Once | INTRAVENOUS | Status: AC | PRN
  Administered 2018-02-04: 500 [IU]
  Filled 2018-02-04: qty 5

## 2018-02-04 MED ORDER — SODIUM CHLORIDE 0.9 % IV SOLN
Freq: Once | INTRAVENOUS | Status: AC
  Administered 2018-02-04: 12:00:00 via INTRAVENOUS
  Filled 2018-02-04: qty 250

## 2018-02-04 NOTE — Patient Instructions (Signed)
[facility] Discharge Instructions for Patients Receiving Chemotherapy  Today you received the following chemotherapy agents doxorubicine, vinblastine, dacarbazine.  To help prevent nausea and vomiting after your treatment, we encourage you to take your nausea medication as indicated by your MD.   If you develop nausea and vomiting that is not controlled by your nausea medication, call the clinic.   BELOW ARE SYMPTOMS THAT SHOULD BE REPORTED IMMEDIATELY:  *FEVER GREATER THAN 100.5 F  *CHILLS WITH OR WITHOUT FEVER  NAUSEA AND VOMITING THAT IS NOT CONTROLLED WITH YOUR NAUSEA MEDICATION  *UNUSUAL SHORTNESS OF BREATH  *UNUSUAL BRUISING OR BLEEDING  TENDERNESS IN MOUTH AND THROAT WITH OR WITHOUT PRESENCE OF ULCERS  *URINARY PROBLEMS  *BOWEL PROBLEMS  UNUSUAL RASH Items with * indicate a potential emergency and should be followed up as soon as possible.  Feel free to call the clinic should you have any questions or concerns. The clinic phone number is [phone].  Please show the [facility] at check-in to the Emergency Department and triage nurse.

## 2018-02-05 ENCOUNTER — Ambulatory Visit: Payer: Medicare Other

## 2018-02-06 ENCOUNTER — Ambulatory Visit: Payer: Medicare Other

## 2018-02-06 ENCOUNTER — Other Ambulatory Visit: Payer: Self-pay | Admitting: Hematology

## 2018-02-06 DIAGNOSIS — C8190 Hodgkin lymphoma, unspecified, unspecified site: Secondary | ICD-10-CM

## 2018-02-07 ENCOUNTER — Ambulatory Visit: Payer: Medicare Other

## 2018-02-07 ENCOUNTER — Other Ambulatory Visit: Payer: Self-pay | Admitting: Medical

## 2018-02-07 ENCOUNTER — Other Ambulatory Visit: Payer: Self-pay | Admitting: *Deleted

## 2018-02-07 DIAGNOSIS — C8190 Hodgkin lymphoma, unspecified, unspecified site: Secondary | ICD-10-CM

## 2018-02-07 MED ORDER — AMOXICILLIN-POT CLAVULANATE 875-125 MG PO TABS: 1.0000 | ORAL_TABLET | Freq: Two times a day (BID) | ORAL | 0 refills | Status: DC

## 2018-02-07 MED ORDER — LORAZEPAM 0.5 MG PO TABS: 0.5000 mg | ORAL_TABLET | Freq: Four times a day (QID) | ORAL | 0 refills | Status: DC | PRN

## 2018-02-10 ENCOUNTER — Other Ambulatory Visit: Payer: Medicare Other

## 2018-02-10 ENCOUNTER — Inpatient Hospital Stay: Payer: Medicare Other

## 2018-02-10 ENCOUNTER — Ambulatory Visit: Payer: Medicare Other | Admitting: Hematology

## 2018-02-10 ENCOUNTER — Other Ambulatory Visit: Payer: Self-pay

## 2018-02-10 VITALS — BP 121/78 | HR 69 | Temp 98.2°F | Resp 18

## 2018-02-10 DIAGNOSIS — C8190 Hodgkin lymphoma, unspecified, unspecified site: Secondary | ICD-10-CM

## 2018-02-10 DIAGNOSIS — E860 Dehydration: Secondary | ICD-10-CM | POA: Diagnosis not present

## 2018-02-10 DIAGNOSIS — K5900 Constipation, unspecified: Secondary | ICD-10-CM | POA: Diagnosis not present

## 2018-02-10 DIAGNOSIS — E46 Unspecified protein-calorie malnutrition: Secondary | ICD-10-CM | POA: Diagnosis not present

## 2018-02-10 DIAGNOSIS — Z5111 Encounter for antineoplastic chemotherapy: Secondary | ICD-10-CM | POA: Diagnosis not present

## 2018-02-10 DIAGNOSIS — C8198 Hodgkin lymphoma, unspecified, lymph nodes of multiple sites: Secondary | ICD-10-CM | POA: Diagnosis not present

## 2018-02-10 DIAGNOSIS — I471 Supraventricular tachycardia: Secondary | ICD-10-CM | POA: Diagnosis not present

## 2018-02-10 MED ORDER — SODIUM CHLORIDE 0.9% FLUSH
10.0000 mL | Freq: Once | INTRAVENOUS | Status: AC | PRN
  Administered 2018-02-10: 10 mL
  Filled 2018-02-10: qty 10

## 2018-02-10 MED ORDER — SODIUM CHLORIDE 0.9 % IV SOLN
Freq: Once | INTRAVENOUS | Status: AC
  Administered 2018-02-10: 11:00:00 via INTRAVENOUS
  Filled 2018-02-10: qty 250

## 2018-02-10 MED ORDER — HEPARIN SOD (PORK) LOCK FLUSH 100 UNIT/ML IV SOLN
500.0000 [IU] | Freq: Once | INTRAVENOUS | Status: AC | PRN
  Administered 2018-02-10: 500 [IU]
  Filled 2018-02-10: qty 5

## 2018-02-10 NOTE — Patient Instructions (Signed)
Dehydration, Adult  Dehydration is a condition in which there is not enough fluid or water in the body. This happens when you lose more fluids than you take in. Important organs, such as the kidneys, brain, and heart, cannot function without a proper amount of fluids. Any loss of fluids from the body can lead to dehydration. Dehydration can range from mild to severe. This condition should be treated right away to prevent it from becoming severe. What are the causes? This condition may be caused by:  Vomiting.  Diarrhea.  Excessive sweating, such as from heat exposure or exercise.  Not drinking enough fluid, especially: ? When ill. ? While doing activity that requires a lot of energy.  Excessive urination.  Fever.  Infection.  Certain medicines, such as medicines that cause the body to lose excess fluid (diuretics).  Inability to access safe drinking water.  Reduced physical ability to get adequate water and food. What increases the risk? This condition is more likely to develop in people:  Who have a poorly controlled long-term (chronic) illness, such as diabetes, heart disease, or kidney disease.  Who are age 65 or older.  Who are disabled.  Who live in a place with high altitude.  Who play endurance sports. What are the signs or symptoms? Symptoms of mild dehydration may include:  Thirst.  Dry lips.  Slightly dry mouth.  Dry, warm skin.  Dizziness. Symptoms of moderate dehydration may include:  Very dry mouth.  Muscle cramps.  Dark urine. Urine may be the color of tea.  Decreased urine production.  Decreased tear production.  Heartbeat that is irregular or faster than normal (palpitations).  Headache.  Light-headedness, especially when you stand up from a sitting position.  Fainting (syncope). Symptoms of severe dehydration may include:  Changes in skin, such as: ? Cold and clammy skin. ? Blotchy (mottled) or pale skin. ? Skin that does  not quickly return to normal after being lightly pinched and released (poor skin turgor).  Changes in body fluids, such as: ? Extreme thirst. ? No tear production. ? Inability to sweat when body temperature is high, such as in hot weather. ? Very little urine production.  Changes in vital signs, such as: ? Weak pulse. ? Pulse that is more than 100 beats a minute when sitting still. ? Rapid breathing. ? Low blood pressure.  Other changes, such as: ? Sunken eyes. ? Cold hands and feet. ? Confusion. ? Lack of energy (lethargy). ? Difficulty waking up from sleep. ? Short-term weight loss. ? Unconsciousness. How is this diagnosed? This condition is diagnosed based on your symptoms and a physical exam. Blood and urine tests may be done to help confirm the diagnosis. How is this treated? Treatment for this condition depends on the severity. Mild or moderate dehydration can often be treated at home. Treatment should be started right away. Do not wait until dehydration becomes severe. Severe dehydration is an emergency and it needs to be treated in a hospital. Treatment for mild dehydration may include:  Drinking more fluids.  Replacing salts and minerals in your blood (electrolytes) that you may have lost. Treatment for moderate dehydration may include:  Drinking an oral rehydration solution (ORS). This is a drink that helps you replace fluids and electrolytes (rehydrate). It can be found at pharmacies and retail stores. Treatment for severe dehydration may include:  Receiving fluids through an IV tube.  Receiving an electrolyte solution through a feeding tube that is passed through your nose and   into your stomach (nasogastric tube, or NG tube).  Correcting any abnormalities in electrolytes.  Treating the underlying cause of dehydration. Follow these instructions at home:  If directed by your health care provider, drink an ORS: ? Make an ORS by following instructions on the  package. ? Start by drinking small amounts, about  cup (120 mL) every 5-10 minutes. ? Slowly increase how much you drink until you have taken the amount recommended by your health care provider.  Drink enough clear fluid to keep your urine clear or pale yellow. If you were told to drink an ORS, finish the ORS first, then start slowly drinking other clear fluids. Drink fluids such as: ? Water. Do not drink only water. Doing that can lead to having too little salt (sodium) in the body (hyponatremia). ? Ice chips. ? Fruit juice that you have added water to (diluted fruit juice). ? Low-calorie sports drinks.  Avoid: ? Alcohol. ? Drinks that contain a lot of sugar. These include high-calorie sports drinks, fruit juice that is not diluted, and soda. ? Caffeine. ? Foods that are greasy or contain a lot of fat or sugar.  Take over-the-counter and prescription medicines only as told by your health care provider.  Do not take sodium tablets. This can lead to having too much sodium in the body (hypernatremia).  Eat foods that contain a healthy balance of electrolytes, such as bananas, oranges, potatoes, tomatoes, and spinach.  Keep all follow-up visits as told by your health care provider. This is important. Contact a health care provider if:  You have abdominal pain that: ? Gets worse. ? Stays in one area (localizes).  You have a rash.  You have a stiff neck.  You are more irritable than usual.  You are sleepier or more difficult to wake up than usual.  You feel weak or dizzy.  You feel very thirsty.  You have urinated only a small amount of very dark urine over 6-8 hours. Get help right away if:  You have symptoms of severe dehydration.  You cannot drink fluids without vomiting.  Your symptoms get worse with treatment.  You have a fever.  You have a severe headache.  You have vomiting or diarrhea that: ? Gets worse. ? Does not go away.  You have blood or green matter  (bile) in your vomit.  You have blood in your stool. This may cause stool to look black and tarry.  You have not urinated in 6-8 hours.  You faint.  Your heart rate while sitting still is over 100 beats a minute.  You have trouble breathing. This information is not intended to replace advice given to you by your health care provider. Make sure you discuss any questions you have with your health care provider. Document Released: 01/01/2005 Document Revised: 07/29/2015 Document Reviewed: 02/25/2015 Elsevier Interactive Patient Education  2019 [employer]  

## 2018-02-10 NOTE — Progress Notes (Signed)
Patient complains of "Knot" in his right jaw.Dr. Maylon Peppers notified.   Dr. Maylon Peppers at chairside.

## 2018-02-11 ENCOUNTER — Telehealth: Payer: Self-pay | Admitting: Cardiology

## 2018-02-11 ENCOUNTER — Inpatient Hospital Stay: Payer: Medicare Other

## 2018-02-11 ENCOUNTER — Ambulatory Visit: Payer: Medicare Other

## 2018-02-11 NOTE — Telephone Encounter (Signed)
Spoke with pt re message Pt was receiving IV therapy yesterday per Oncologists to prevent dehydration and after completion noted elevated HR ranging 148-155 for a couple of hours and a uneasy feeling . Per pt since starting Metoprolol has been doing well HR rate normally around 55. Pt was wanting to let Dr Curt Bears know and if Dr Curt Bears has any recommendations the patient feels fine today.Will forward to Dr Curt Bears for review and recommendations./cy

## 2018-02-11 NOTE — Telephone Encounter (Signed)
Patient c/o Palpitations:  High priority if patient c/o lightheadedness, shortness of breath, or chest pain  1) How long have you had palpitations/irregular HR/ Afib? Are you having the symptoms now? Yesterday- Started a couple hours after he received his IV for Chemo  2) Are you currently experiencing lightheadedness, SOB or CP?no  3) Do you have a history of afib (atrial fibrillation) or irregular heart rhythm? no  4) Have you checked your BP or HR? (document readings if available): 114/72-it was good  5) Are you experiencing any other symptoms? No other symptoms

## 2018-02-12 ENCOUNTER — Inpatient Hospital Stay: Payer: Medicare Other

## 2018-02-12 ENCOUNTER — Telehealth: Payer: Self-pay

## 2018-02-12 ENCOUNTER — Encounter: Payer: Self-pay | Admitting: Medical

## 2018-02-12 ENCOUNTER — Ambulatory Visit (INDEPENDENT_AMBULATORY_CARE_PROVIDER_SITE_OTHER): Payer: Medicare Other | Admitting: Medical

## 2018-02-12 VITALS — BP 130/90 | HR 80 | Ht 71.0 in | Wt 242.4 lb

## 2018-02-12 DIAGNOSIS — I10 Essential (primary) hypertension: Secondary | ICD-10-CM | POA: Diagnosis not present

## 2018-02-12 DIAGNOSIS — C8190 Hodgkin lymphoma, unspecified, unspecified site: Secondary | ICD-10-CM | POA: Diagnosis not present

## 2018-02-12 DIAGNOSIS — F419 Anxiety disorder, unspecified: Secondary | ICD-10-CM | POA: Diagnosis not present

## 2018-02-12 DIAGNOSIS — I471 Supraventricular tachycardia: Secondary | ICD-10-CM

## 2018-02-12 MED ORDER — DILTIAZEM HCL 30 MG PO TABS: ORAL_TABLET | ORAL | 0 refills | Status: DC

## 2018-02-12 NOTE — Telephone Encounter (Signed)
Pt reports that he used to have panic attacks about 20 years ago and he thinks elevated HRs are more r/t that currently.  He states "I get to thinking about things and my HR goes up".  Reports that this has been occurring more frequently recently.  It lasted about an hour last night and the night before.  He states that on avg episodes last 30 min to an hour and then HR comes back down. Pt feels that chemo and anxiety are triggering episodes.  Pt does not wish to discuss repeat ablation at this particular time.  He will contact PCP about restarting Xanax for anxiety.  He will call back if he would like to discuss further or episodes don't improve/worsen.

## 2018-02-12 NOTE — Progress Notes (Signed)
Subjective: Chief Complaint  Patient presents with  . Anxiety    wants to start Xanax    Here for anxiety.  Accompanied by wife.    Medical team: Dr. Allegra Lai and Dr. Mertie Moores, cardiology Dr. [name], oncology Naren Benally, Camelia Eng, PA-C here for primary care   He is here because "he needs a prescription for xanax."  He was hospitalized few weeks ago for SVT, had ablation, discharged on metoprolol, was stable at discharge.     He notes in recent week having panic attacks.  Wife says he has been getting worked up about his chemotherapy.  Hasn't tolerated chemotherapy well.   Feels good the first few days on chemo treatment , then feels bad for several days, makes him anxious.  He is getting ready to do his next dose of chemo again, and worried about this.   Wife says he is worried in general about the next few months of therapy.    He reports that while on chemo is emotional, can be watching tv and bust out crying.   Been on chemo 2 weeks since heart ablation.     He reports that he is tolerating the medication Metoprolol, and feels like its keeping his BP and pulse stable since the hospitalization.  2 days ago pulse was fast for hour and half, and he felt jittery.  then pulse came back down.  He has his own pulse oximetry to check his pulse and oxygen, and wife says he checks it a lot, likely further increasing his anxiety.    He called cardiology about this.  Just spoke to them today, advised the come talk to use PCP about xanax.  Not taking Reglan.  Uses miralax, just uses every so often to help clean out his bowels if he gets constipated.    He notes Ativan was prescribed to help with nausea.   He has used this recently but says it makes his anxiety worse.    He reports begin on xanax years ago for anxiety and this helped a lot.  Sees Dr. Maylon Peppers Monday for oncology   Next appt with cardiology planned for late next month.    Past Medical History:  Diagnosis Date  .  History of hiatal hernia   . Hodgkin's lymphoma ([place]) 09/2017  . Hypertension 2006  . Obesity   . Pneumonia 09/2017  . SVT (supraventricular tachycardia) ([place])   . Wears glasses    Current Outpatient Medications on File Prior to Visit  Medication Sig Dispense Refill  . amoxicillin-clavulanate (AUGMENTIN) 875-125 MG tablet Take 1 tablet by mouth 2 (two) times daily for 10 days. 20 tablet 0  . aspirin EC 81 MG tablet Take 1 tablet (81 mg total) by mouth daily. 90 tablet 3  . atorvastatin (LIPITOR) 20 MG tablet TAKE 1 TABLET BY MOUTH DAILY (Patient taking differently: Take 20 mg by mouth daily. ) 90 tablet 2  . Cholecalciferol (VITAMIN D PO) Take 1 capsule by mouth at bedtime.    Marland Kitchen dexamethasone (DECADRON) 4 MG tablet Take 2 tablets by mouth once a day on the day after chemotherapy and then take 2 tablets two times a day for 2 days. Take with food. 30 tablet 1  . diclofenac (VOLTAREN) 75 MG EC tablet Take 75 mg by mouth daily.    Marland Kitchen ibuprofen (ADVIL,MOTRIN) 200 MG tablet Take 400 mg by mouth daily as needed for moderate pain.    Marland Kitchen LORazepam (ATIVAN) 0.5 MG tablet Take 1  tablet (0.5 mg total) by mouth every 6 (six) hours as needed (Nausea or vomiting). 30 tablet 0  . magnesium oxide (MAG-OX) 400 (241.3 Mg) MG tablet Take 1 tablet (400 mg total) by mouth 2 (two) times daily. 60 tablet 4  . metoprolol succinate (TOPROL XL) 100 MG 24 hr tablet Take 1 tablet (100 mg total) by mouth 2 (two) times daily. Take with or immediately following a meal. 180 tablet 3  . polyethylene glycol (MIRALAX) packet Take 17 g by mouth 2 (two) times daily. 14 each 5  . potassium chloride SA (K-DUR,KLOR-CON) 20 MEQ tablet Take 1 tablet (20 mEq total) by mouth 2 (two) times daily. 180 tablet 0  . prochlorperazine (COMPAZINE) 10 MG tablet Take 1 tablet (10 mg total) by mouth every 6 (six) hours as needed (Nausea or vomiting). 30 tablet 1  . ranitidine (ZANTAC) 150 MG tablet Take 150 mg by mouth daily as needed for heartburn.     . vitamin B-12 (CYANOCOBALAMIN) 100 MCG tablet Take 100 mcg by mouth daily.    Marland Kitchen HYDROcodone-acetaminophen (NORCO) 7.5-325 MG tablet Take 1 tablet by mouth every 6 (six) hours as needed for moderate pain. (Patient not taking: Reported on 02/12/2018) 10 tablet 0  . leucovorin (WELLCOVORIN) 15 MG tablet Take 1 tablet (15 mg total) by mouth daily. (Patient not taking: Reported on 02/12/2018) 30 tablet 5  . metoCLOPramide (REGLAN) 10 MG tablet Take 1 tablet (10 mg total) by mouth 3 (three) times daily before meals for 14 days. 42 tablet 0   No current facility-administered medications on file prior to visit.    ROS as in subjective    Objective: BP 130/90   Pulse 80   Ht 5\' 11"  (1.803 m)   Wt 242 lb 6.4 oz (110 kg)   BMI 33.81 kg/m   Gen: seems fatigued, anxious Lungs clear Heart quite tachycardic, but no murmurs No edema Pulses rapid   Adult ECG Report  Indication: tachycardia  Rate: 167 bpm  Rhythm: sinus tachycardia  QRS Axis: -42 degrees  PR Interval: unreadable  QRS Duration: 90 ms  QTc: 455ms  Conduction Disturbances: left axis deviation, ST abnormality  Other Abnormalities: none  Patient's cardiac risk factors are: advanced age (older than 95 for men, 15 for women), hypertension and obesity (BMI >= 30 kg/m2).  EKG comparison: 01/30/2018  Narrative Interpretation: SVT     Assessment: Encounter Diagnoses  Name Primary?  . SVT (supraventricular tachycardia) ([place]) Yes  . Anxiety   . Essential hypertension   . Hodgkin lymphoma, unspecified Hodgkin lymphoma type, unspecified body region [facility])      Plan: We discussed his concerns, anxiety, panic feeling, overall feeling of fatigue and uncertainty  Initially we talked about coping skills, anxiety, discussing his concerns, fears of side effects from chemotherapy, and resources available through cancer center.   I advised he joint support group at cancer center.   Advised he have some questions for Dr. Maylon Peppers regarding  side effects of chemo, general questions that he feels he needs answered if he has unanswered questions about treatment plan  After examining him and finding tachycardia which was not noted on triage, I obtained EKG.   I then discussed EKG and case with Dr. Redmond School supervising physician.   I called and spoke to the doctor on call at cardiology, Dr. Rayann Heman regarding his case, EKG, and recent ablation.   He advised we c/t Metoprolol 100mg  BID but add Diltiazem 30mg , 1-2 tablets q6 hours until symptom and  pulse seem to stabilize and calm down.   He will arrange follow up with Mr. Calel next week.  I appreciate Dr. Jackalyn Lombard help.  I discussed the recommendations with Mr. Alessio.  Advised he call back the next 2 days to let me know how he is feeling for close f/u by phone.  He and wife voiced understanding and agreement of plan.  If any changes, worse, or other symptoms, call or return or go to the ED.    Phuong was seen today for anxiety.  Diagnoses and all orders for this visit:  SVT (supraventricular tachycardia) ([place]) -     EKG 12-Lead  Anxiety  Essential hypertension  Hodgkin lymphoma, unspecified Hodgkin lymphoma type, unspecified body region ([place])  Other orders -     diltiazem (CARDIZEM) 30 MG tablet; 1-2 tablets q6hrs prn  f/u with cardiology next week, call report to me tomorrow

## 2018-02-12 NOTE — Telephone Encounter (Signed)
DOD call received from Chana Bode PA.  Pt at PCP office.  See EKG.  Per Dr. Twana First Pt start Diltiazem 30 mg 1-2 tablets as needed for tachycardia. Have Pt follow up in one week for repeat EKG/OV with Dr. Curt Bears.  Sending to Dr. Curt Bears.

## 2018-02-13 ENCOUNTER — Inpatient Hospital Stay: Payer: Medicare Other

## 2018-02-13 ENCOUNTER — Encounter: Payer: Self-pay | Admitting: Cardiology

## 2018-02-13 ENCOUNTER — Telehealth: Payer: Self-pay | Admitting: Medical

## 2018-02-13 ENCOUNTER — Ambulatory Visit: Payer: Medicare Other | Admitting: Cardiovascular Disease

## 2018-02-13 NOTE — Telephone Encounter (Signed)
Pt called and wanted to let you know that he is doing fine on the new medication you prescribed him yesterday.

## 2018-02-13 NOTE — Telephone Encounter (Signed)
Can he feel the heart rate under 100?   Has the anxiety calmed down a bit?  Call back again tomorrow for another symptom update.

## 2018-02-13 NOTE — Telephone Encounter (Signed)
He said he felt really good today. I will call him back tomorrow for an update.

## 2018-02-14 ENCOUNTER — Other Ambulatory Visit: Payer: Self-pay | Admitting: Medical

## 2018-02-14 ENCOUNTER — Other Ambulatory Visit: Payer: Self-pay

## 2018-02-14 ENCOUNTER — Inpatient Hospital Stay: Payer: Medicare Other

## 2018-02-14 VITALS — BP 134/73 | HR 70 | Temp 98.1°F | Resp 18

## 2018-02-14 DIAGNOSIS — E46 Unspecified protein-calorie malnutrition: Secondary | ICD-10-CM | POA: Diagnosis not present

## 2018-02-14 DIAGNOSIS — E860 Dehydration: Secondary | ICD-10-CM | POA: Diagnosis not present

## 2018-02-14 DIAGNOSIS — Z5111 Encounter for antineoplastic chemotherapy: Secondary | ICD-10-CM | POA: Diagnosis not present

## 2018-02-14 DIAGNOSIS — C8198 Hodgkin lymphoma, unspecified, lymph nodes of multiple sites: Secondary | ICD-10-CM | POA: Diagnosis not present

## 2018-02-14 DIAGNOSIS — I471 Supraventricular tachycardia: Secondary | ICD-10-CM | POA: Diagnosis not present

## 2018-02-14 DIAGNOSIS — C8190 Hodgkin lymphoma, unspecified, unspecified site: Secondary | ICD-10-CM

## 2018-02-14 DIAGNOSIS — K5900 Constipation, unspecified: Secondary | ICD-10-CM | POA: Diagnosis not present

## 2018-02-14 MED ORDER — SODIUM CHLORIDE 0.9% FLUSH
10.0000 mL | Freq: Once | INTRAVENOUS | Status: AC | PRN
  Administered 2018-02-14: 10 mL
  Filled 2018-02-14: qty 10

## 2018-02-14 MED ORDER — DILTIAZEM HCL ER COATED BEADS 240 MG PO CP24: 240.0000 mg | ORAL_CAPSULE | Freq: Every day | ORAL | 1 refills | Status: DC

## 2018-02-14 MED ORDER — NITROGLYCERIN 0.3 MG SL SUBL: 0.3000 mg | SUBLINGUAL_TABLET | SUBLINGUAL | 0 refills | Status: DC | PRN

## 2018-02-14 MED ORDER — SODIUM CHLORIDE 0.9 % IV SOLN
Freq: Once | INTRAVENOUS | Status: AC
  Administered 2018-02-14: 11:00:00 via INTRAVENOUS
  Filled 2018-02-14: qty 250

## 2018-02-14 MED ORDER — DILTIAZEM HCL 30 MG PO TABS: ORAL_TABLET | ORAL | 0 refills | Status: DC

## 2018-02-14 MED ORDER — HEPARIN SOD (PORK) LOCK FLUSH 100 UNIT/ML IV SOLN
500.0000 [IU] | Freq: Once | INTRAVENOUS | Status: AC | PRN
  Administered 2018-02-14: 500 [IU]
  Filled 2018-02-14: qty 5

## 2018-02-14 NOTE — Patient Instructions (Signed)
Dehydration, Adult  Dehydration is a condition in which there is not enough fluid or water in the body. This happens when you lose more fluids than you take in. Important organs, such as the kidneys, brain, and heart, cannot function without a proper amount of fluids. Any loss of fluids from the body can lead to dehydration. Dehydration can range from mild to severe. This condition should be treated right away to prevent it from becoming severe. What are the causes? This condition may be caused by:  Vomiting.  Diarrhea.  Excessive sweating, such as from heat exposure or exercise.  Not drinking enough fluid, especially: ? When ill. ? While doing activity that requires a lot of energy.  Excessive urination.  Fever.  Infection.  Certain medicines, such as medicines that cause the body to lose excess fluid (diuretics).  Inability to access safe drinking water.  Reduced physical ability to get adequate water and food. What increases the risk? This condition is more likely to develop in people:  Who have a poorly controlled long-term (chronic) illness, such as diabetes, heart disease, or kidney disease.  Who are age 65 or older.  Who are disabled.  Who live in a place with high altitude.  Who play endurance sports. What are the signs or symptoms? Symptoms of mild dehydration may include:  Thirst.  Dry lips.  Slightly dry mouth.  Dry, warm skin.  Dizziness. Symptoms of moderate dehydration may include:  Very dry mouth.  Muscle cramps.  Dark urine. Urine may be the color of tea.  Decreased urine production.  Decreased tear production.  Heartbeat that is irregular or faster than normal (palpitations).  Headache.  Light-headedness, especially when you stand up from a sitting position.  Fainting (syncope). Symptoms of severe dehydration may include:  Changes in skin, such as: ? Cold and clammy skin. ? Blotchy (mottled) or pale skin. ? Skin that does  not quickly return to normal after being lightly pinched and released (poor skin turgor).  Changes in body fluids, such as: ? Extreme thirst. ? No tear production. ? Inability to sweat when body temperature is high, such as in hot weather. ? Very little urine production.  Changes in vital signs, such as: ? Weak pulse. ? Pulse that is more than 100 beats a minute when sitting still. ? Rapid breathing. ? Low blood pressure.  Other changes, such as: ? Sunken eyes. ? Cold hands and feet. ? Confusion. ? Lack of energy (lethargy). ? Difficulty waking up from sleep. ? Short-term weight loss. ? Unconsciousness. How is this diagnosed? This condition is diagnosed based on your symptoms and a physical exam. Blood and urine tests may be done to help confirm the diagnosis. How is this treated? Treatment for this condition depends on the severity. Mild or moderate dehydration can often be treated at home. Treatment should be started right away. Do not wait until dehydration becomes severe. Severe dehydration is an emergency and it needs to be treated in a hospital. Treatment for mild dehydration may include:  Drinking more fluids.  Replacing salts and minerals in your blood (electrolytes) that you may have lost. Treatment for moderate dehydration may include:  Drinking an oral rehydration solution (ORS). This is a drink that helps you replace fluids and electrolytes (rehydrate). It can be found at pharmacies and retail stores. Treatment for severe dehydration may include:  Receiving fluids through an IV tube.  Receiving an electrolyte solution through a feeding tube that is passed through your nose and   into your stomach (nasogastric tube, or NG tube).  Correcting any abnormalities in electrolytes.  Treating the underlying cause of dehydration. Follow these instructions at home:  If directed by your health care provider, drink an ORS: ? Make an ORS by following instructions on the  package. ? Start by drinking small amounts, about  cup (120 mL) every 5-10 minutes. ? Slowly increase how much you drink until you have taken the amount recommended by your health care provider.  Drink enough clear fluid to keep your urine clear or pale yellow. If you were told to drink an ORS, finish the ORS first, then start slowly drinking other clear fluids. Drink fluids such as: ? Water. Do not drink only water. Doing that can lead to having too little salt (sodium) in the body (hyponatremia). ? Ice chips. ? Fruit juice that you have added water to (diluted fruit juice). ? Low-calorie sports drinks.  Avoid: ? Alcohol. ? Drinks that contain a lot of sugar. These include high-calorie sports drinks, fruit juice that is not diluted, and soda. ? Caffeine. ? Foods that are greasy or contain a lot of fat or sugar.  Take over-the-counter and prescription medicines only as told by your health care provider.  Do not take sodium tablets. This can lead to having too much sodium in the body (hypernatremia).  Eat foods that contain a healthy balance of electrolytes, such as bananas, oranges, potatoes, tomatoes, and spinach.  Keep all follow-up visits as told by your health care provider. This is important. Contact a health care provider if:  You have abdominal pain that: ? Gets worse. ? Stays in one area (localizes).  You have a rash.  You have a stiff neck.  You are more irritable than usual.  You are sleepier or more difficult to wake up than usual.  You feel weak or dizzy.  You feel very thirsty.  You have urinated only a small amount of very dark urine over 6-8 hours. Get help right away if:  You have symptoms of severe dehydration.  You cannot drink fluids without vomiting.  Your symptoms get worse with treatment.  You have a fever.  You have a severe headache.  You have vomiting or diarrhea that: ? Gets worse. ? Does not go away.  You have blood or green matter  (bile) in your vomit.  You have blood in your stool. This may cause stool to look black and tarry.  You have not urinated in 6-8 hours.  You faint.  Your heart rate while sitting still is over 100 beats a minute.  You have trouble breathing. This information is not intended to replace advice given to you by your health care provider. Make sure you discuss any questions you have with your health care provider. Document Released: 01/01/2005 Document Revised: 07/29/2015 Document Reviewed: 02/25/2015 Elsevier Interactive Patient Education  2019 [employer]  

## 2018-02-14 NOTE — Telephone Encounter (Signed)
Pt reports having to take Diltiazem PRN 3 times daily.  He is taking 2 tablets at a time.  States that "HR starts racing every evening around 8/9 pm and last couple hours" and then calms down".  Pt is very anxious about this and states that he isn't confident in our on call staff b/c he "called twice this week after hours and never got a call back".  He is aware I will address their concern w/ mngt. Discussed w/ Camnitz - order to: Advised to start Diltiazem 240 Aware he may still take PRN diltiazem also & another PRN Rx sent to pharmacy.  Pt appreciates all our help and understands I will f/u on Monday to see how he is doing.

## 2018-02-17 ENCOUNTER — Inpatient Hospital Stay: Payer: Medicare Other

## 2018-02-17 ENCOUNTER — Inpatient Hospital Stay (HOSPITAL_BASED_OUTPATIENT_CLINIC_OR_DEPARTMENT_OTHER): Payer: Medicare Other | Admitting: Hematology

## 2018-02-17 ENCOUNTER — Other Ambulatory Visit: Payer: Self-pay | Admitting: Medical

## 2018-02-17 ENCOUNTER — Inpatient Hospital Stay: Payer: Medicare Other | Attending: Hematology

## 2018-02-17 ENCOUNTER — Other Ambulatory Visit: Payer: Self-pay

## 2018-02-17 ENCOUNTER — Encounter: Payer: Self-pay | Admitting: Hematology

## 2018-02-17 VITALS — BP 149/81 | HR 64 | Temp 98.2°F | Resp 18 | Ht 71.0 in | Wt 243.0 lb

## 2018-02-17 DIAGNOSIS — E861 Hypovolemia: Secondary | ICD-10-CM | POA: Diagnosis not present

## 2018-02-17 DIAGNOSIS — R05 Cough: Secondary | ICD-10-CM | POA: Diagnosis not present

## 2018-02-17 DIAGNOSIS — I700 Atherosclerosis of aorta: Secondary | ICD-10-CM

## 2018-02-17 DIAGNOSIS — Z5111 Encounter for antineoplastic chemotherapy: Secondary | ICD-10-CM | POA: Diagnosis not present

## 2018-02-17 DIAGNOSIS — F329 Major depressive disorder, single episode, unspecified: Secondary | ICD-10-CM | POA: Diagnosis not present

## 2018-02-17 DIAGNOSIS — Z7689 Persons encountering health services in other specified circumstances: Secondary | ICD-10-CM | POA: Diagnosis not present

## 2018-02-17 DIAGNOSIS — T451X5A Adverse effect of antineoplastic and immunosuppressive drugs, initial encounter: Secondary | ICD-10-CM | POA: Insufficient documentation

## 2018-02-17 DIAGNOSIS — Z79899 Other long term (current) drug therapy: Secondary | ICD-10-CM | POA: Insufficient documentation

## 2018-02-17 DIAGNOSIS — D6481 Anemia due to antineoplastic chemotherapy: Secondary | ICD-10-CM | POA: Diagnosis not present

## 2018-02-17 DIAGNOSIS — I509 Heart failure, unspecified: Secondary | ICD-10-CM | POA: Diagnosis not present

## 2018-02-17 DIAGNOSIS — R059 Cough, unspecified: Secondary | ICD-10-CM

## 2018-02-17 DIAGNOSIS — E871 Hypo-osmolality and hyponatremia: Secondary | ICD-10-CM | POA: Diagnosis not present

## 2018-02-17 DIAGNOSIS — E46 Unspecified protein-calorie malnutrition: Secondary | ICD-10-CM | POA: Insufficient documentation

## 2018-02-17 DIAGNOSIS — D701 Agranulocytosis secondary to cancer chemotherapy: Secondary | ICD-10-CM | POA: Insufficient documentation

## 2018-02-17 DIAGNOSIS — Z7982 Long term (current) use of aspirin: Secondary | ICD-10-CM | POA: Insufficient documentation

## 2018-02-17 DIAGNOSIS — Z95828 Presence of other vascular implants and grafts: Secondary | ICD-10-CM

## 2018-02-17 DIAGNOSIS — C8190 Hodgkin lymphoma, unspecified, unspecified site: Secondary | ICD-10-CM | POA: Diagnosis not present

## 2018-02-17 DIAGNOSIS — I471 Supraventricular tachycardia: Secondary | ICD-10-CM | POA: Insufficient documentation

## 2018-02-17 LAB — CBC WITH DIFFERENTIAL (CANCER CENTER ONLY)
ABS IMMATURE GRANULOCYTES: 0 10*3/uL (ref 0.00–0.07)
Basophils Absolute: 0 10*3/uL (ref 0.0–0.1)
Basophils Relative: 1 %
Eosinophils Absolute: 0.2 10*3/uL (ref 0.0–0.5)
Eosinophils Relative: 10 %
HCT: 31.8 % — ABNORMAL LOW (ref 39.0–52.0)
Hemoglobin: 10 g/dL — ABNORMAL LOW (ref 13.0–17.0)
Immature Granulocytes: 0 %
Lymphocytes Relative: 46 %
Lymphs Abs: 1 10*3/uL (ref 0.7–4.0)
MCH: 27.8 pg (ref 26.0–34.0)
MCHC: 31.4 g/dL (ref 30.0–36.0)
MCV: 88.3 fL (ref 80.0–100.0)
Monocytes Absolute: 0.4 10*3/uL (ref 0.1–1.0)
Monocytes Relative: 18 %
Neutro Abs: 0.5 10*3/uL — ABNORMAL LOW (ref 1.7–7.7)
Neutrophils Relative %: 25 %
Platelet Count: 180 10*3/uL (ref 150–400)
RBC: 3.6 MIL/uL — ABNORMAL LOW (ref 4.22–5.81)
RDW: 17 % — ABNORMAL HIGH (ref 11.5–15.5)
WBC Count: 2.1 10*3/uL — ABNORMAL LOW (ref 4.0–10.5)
nRBC: 0 % (ref 0.0–0.2)

## 2018-02-17 LAB — CMP (CANCER CENTER ONLY)
ALT: 21 U/L (ref 0–44)
AST: 13 U/L — ABNORMAL LOW (ref 15–41)
Albumin: 3.6 g/dL (ref 3.5–5.0)
Alkaline Phosphatase: 80 U/L (ref 38–126)
Anion gap: 8 (ref 5–15)
BILIRUBIN TOTAL: 0.5 mg/dL (ref 0.3–1.2)
BUN: 13 mg/dL (ref 8–23)
CO2: 27 mmol/L (ref 22–32)
Calcium: 9.2 mg/dL (ref 8.9–10.3)
Chloride: 102 mmol/L (ref 98–111)
Creatinine: 0.74 mg/dL (ref 0.61–1.24)
GFR, Est AFR Am: >60 mL/min (ref >60)
GFR, Estimated: >60 mL/min (ref >60)
Glucose, Bld: 111 mg/dL — ABNORMAL HIGH (ref 70–99)
Potassium: 3.6 mmol/L (ref 3.5–5.1)
Sodium: 137 mmol/L (ref 135–145)
Total Protein: 6.1 g/dL — ABNORMAL LOW (ref 6.5–8.1)

## 2018-02-17 LAB — MAGNESIUM: Magnesium: 1.7 mg/dL (ref 1.7–2.4)

## 2018-02-17 MED ORDER — SODIUM CHLORIDE 0.9% FLUSH
10.0000 mL | INTRAVENOUS | Status: DC | PRN
  Administered 2018-02-17: 10 mL via INTRAVENOUS
  Filled 2018-02-17: qty 10

## 2018-02-17 MED ORDER — BENZONATATE 100 MG PO CAPS: 100.0000 mg | ORAL_CAPSULE | Freq: Three times a day (TID) | ORAL | 3 refills | Status: AC | PRN

## 2018-02-17 MED ORDER — HEPARIN SOD (PORK) LOCK FLUSH 100 UNIT/ML IV SOLN
500.0000 [IU] | Freq: Once | INTRAVENOUS | Status: AC
  Administered 2018-02-17: 500 [IU] via INTRAVENOUS
  Filled 2018-02-17: qty 5

## 2018-02-17 NOTE — Progress Notes (Signed)
[facility] OFFICE PROGRESS NOTE  Patient Care Team: Tysinger, Camelia Eng, PA-C as PCP - General (Family Medicine) Nahser, Wonda Cheng, MD as PCP - Cardiology (Cardiology)  HEME/ONC OVERVIEW: 1. Stage III classic Hodgkin lymphoma, subtype NOS due to limited specimen -Bx-proven R cervical LN involvement in 09/2017; PET in 11/2017 showed cervical, mediastinal and upper abdominal LN involement; treatment delayed due to pt traveling on a cruise after diagnosis -11/2017 - present: ABVD -12/2017: PET after 2 cycles of ABVD showed interval CR  -Chemotherapy delayed due to SVT requiring ablation in 01/2018 -Mid-01/2018 - present: AVD   2. Port placement in 11/2017   CHEMOTHERAPY REGIMEN:  11/26/2017 - 01/07/2018: ABVD x 2 cycles, CR  02/04/2018 - present: AVD, plan for 4 cycles  ASSESSMENT & PLAN:   Stage III classic Hodgkin lymphoma, favorable risk  -Patient tolerated C3D1 of AVD relatively well except persistent cough -CBC notable for leukopenia and neutropenia; in the absence of clinically suspicious symptoms for infection, okay to proceed with C3D14 of AVD on 02/18/2018 -Plan for a total of 4 cycles of AVD, pending the patient's tolerance of the chemotherapy  -PRN anti-emetics: Zofran, Compazine, dexamethasone and Ativan  Chemotherapy-associated neutropenia -Secondary to AVD -WBC 2.1k w/ ANC 500 -Patient denies any symptoms of infection -In the absence of infection, neutropenia alone should not delay treatment for Hodgkin lymphoma -We will monitor it for now; no need for dose adjustment   Chemotherapy-associated anemia -Secondary to AVD -Hgb 10.0 today, stable -We will monitor for now; no need for dose adjustment  Persistent cough -Patient reports intermittent cough without sputum production since starting chemotherapy -He recently completed a course of Augmentin with modest improvement in coughing -I prescribed Tessalon Perles -If the patient's cough continues to  persist at the next visit, I will order CT chest without contrast to assess for any evidence of bleomycin-induced lung injury  Malnutrition -Patient has been able to maintain his weight with nutritional supplements and IV fluids (starting the week after chemotherapy x 5 days with each treatment) -Plan to continue IV hydration with 1L NS daily x 5 days during the 2nd week of each chemotherapy tx for now   SVT -S/p ablation in mid-01/2018 -Patient reports intermittent palpitations at night, for which his cardiologist has added diltiazem ER 240 mg daily and diltiazem regular release 30 mg QHS PRN  -He feels that his palpitation may in part be due to anxiety -I encourage patient to follow-up with his cardiologist for further management  No orders of the defined types were placed in this encounter.  All questions were answered. The patient knows to call the clinic with any problems, questions or concerns. No barriers to learning was detected.  Return in 2 weeks for labs and clinic follow-up prior to C4D1 of AVD.   [name], MD 02/17/2018 10:11 AM  CHIEF COMPLAINT: "I am doing okay "  INTERVAL HISTORY:  [facility] Novak returns to clinic for follow-up of Hodgkin lymphoma.  He reports intermittent cough without sputum production.  He tolerated C3D1 chemotherapy relatively well without significant GI side effects.  He had intermittent palpitations, usually occurring at night, which he initially attributed to anxiety, but after discussion with his cardiologist, diltiazem ER was added in the morning and diltiazem regular release was added in the evening on PRN basis.  He denies any recurrent palpitations over the past few days.  He is otherwise eating well and maintaining his weight.  SUMMARY OF ONCOLOGIC HISTORY:   Hodgkin lymphoma ([place])  08/29/2017 Initial Diagnosis    Hodgkin lymphoma ([place])    10/07/2017 Imaging    CT neck w/ contrast: Enlarged nodes in the right jugular chain, posterior triangle,  and supraclavicular fossa. The pattern is most concerning for lymphoma; multiple nodes are superficial and amenable to biopsy.  CT CAP w/ contrast: 1. Right supraclavicular and subcarinal adenopathy is identified. Differential considerations include lymphoproliferative disorder, metastatic adenopathy, granulomatous inflammation/infection or reactive adenopathy. Consider further evaluation with tissue sampling. 2. No acute findings, mass or adenopathy within the abdomen. 3. Lungs are clear.  No evidence for pneumonia.  4.  Aortic Atherosclerosis (ICD10-I70.0).    10/25/2017 Procedure    US-guided R cervical LN biopsy    10/25/2017 Pathology Results    (Accession: [id number]) Lymph node, needle/core biopsy, Right Cervical - CLASSIC HODGKIN LYMPHOMA - SEE COMMENT - CD30 (1%) - Unable to subtype due to limited sample specimen    11/12/2017 Cancer Staging    Staging form: Hodgkin and Non-Hodgkin Lymphoma, AJCC 8th Edition - Clinical stage from 11/12/2017: Stage II (Hodgkin lymphoma, A - Asymptomatic) - Signed by [name], MD on 11/12/2017    11/21/2017 -  Chemotherapy    The patient had DOXOrubicin (ADRIAMYCIN) chemo injection 60 mg, 25 mg/m2 = 60 mg, Intravenous,  Once, 0 of 4 cycles palonosetron (ALOXI) injection 0.25 mg, 0.25 mg, Intravenous,  Once, 0 of 4 cycles bleomycin (BLEOCIN) 24 Units in sodium chloride 0.9 % 50 mL chemo infusion, 10 Units/m2 = 24 Units, Intravenous,  Once, 0 of 4 cycles dacarbazine (DTIC) 910 mg in sodium chloride 0.9 % 250 mL chemo infusion, 375 mg/m2 = 910 mg, Intravenous,  Once, 0 of 4 cycles vinBLAStine (VELBAN) 14.6 mg in sodium chloride 0.9 % 50 mL chemo infusion, 6 mg/m2 = 14.6 mg, Intravenous, Once, 0 of 4 cycles fosaprepitant (EMEND) 150 mg, dexamethasone (DECADRON) 12 mg in sodium chloride 0.9 % 145 mL IVPB, , Intravenous,  Once, 0 of 4 cycles  for chemotherapy treatment.     11/21/2017 Imaging    PET: IMPRESSION: 1. Multiple relatively small  hypermetabolic lymph nodes in the RIGHT neck, mediastinum and limited involvement of the upper abdomen. Lymph node activity is high (greater than liver, [place] 4). 2. The most intense enlarged lymph node is anterior to the sternocleidomastoid muscle on the RIGHT. There are multiple assessable lymph nodes in the RIGHT cervical chain. 3. Minimal hypermetabolic adenopathy in the upper abdomen. No pelvic or inguinal metastatic adenopathy. 4. Normal spleen and bone marrow. 5. Single focus of metabolic activity in the sigmoid colon. Recommend screening colonoscopy if patient is not current for such.    01/10/2018 Imaging    PET (after 2 cycles of ABVD) IMPRESSION: 1. Interval complete response to therapy. No abnormal areas of persistent hypermetabolic adenopathy identified. 2.  Aortic Atherosclerosis (ICD10-I70.0). 3. Multi vessel coronary artery atherosclerotic calcifications.     REVIEW OF SYSTEMS:   Constitutional: ( - ) fevers, ( - )  chills , ( - ) night sweats Eyes: ( - ) blurriness of vision, ( - ) double vision, ( - ) watery eyes Ears, nose, mouth, throat, and face: ( - ) mucositis, ( - ) sore throat Respiratory: ( + ) cough, ( - ) dyspnea, ( - ) wheezes Cardiovascular: ( - ) palpitation, ( - ) chest discomfort, ( - ) lower extremity swelling Gastrointestinal:  ( - ) nausea, ( - ) heartburn, ( - ) change in bowel habits Skin: ( - ) abnormal skin rashes Lymphatics: ( - )  new lymphadenopathy, ( - ) easy bruising Neurological: ( - ) numbness, ( - ) tingling, ( - ) new weaknesses Behavioral/Psych: ( - ) mood change, ( - ) new changes  All other systems were reviewed with the patient and are negative.  I have reviewed the past medical history, past surgical history, social history and family history with the patient and they are unchanged from previous note.  ALLERGIES:  has No Known Allergies.  MEDICATIONS:  Current Outpatient Medications  Medication Sig Dispense Refill  . aspirin  EC 81 MG tablet Take 1 tablet (81 mg total) by mouth daily. 90 tablet 3  . atorvastatin (LIPITOR) 20 MG tablet TAKE 1 TABLET BY MOUTH DAILY (Patient taking differently: Take 20 mg by mouth daily. ) 90 tablet 2  . benzonatate (TESSALON) 100 MG capsule Take 1 capsule (100 mg total) by mouth 3 (three) times daily as needed for up to 30 days for cough. 60 capsule 3  . Cholecalciferol (VITAMIN D PO) Take 1 capsule by mouth at bedtime.    Marland Kitchen dexamethasone (DECADRON) 4 MG tablet Take 2 tablets by mouth once a day on the day after chemotherapy and then take 2 tablets two times a day for 2 days. Take with food. 30 tablet 1  . diclofenac (VOLTAREN) 75 MG EC tablet Take 75 mg by mouth daily.    Marland Kitchen diltiazem (CARDIZEM CD) 240 MG 24 hr capsule Take 1 capsule (240 mg total) by mouth daily. 90 capsule 1  . ibuprofen (ADVIL,MOTRIN) 200 MG tablet Take 400 mg by mouth daily as needed for moderate pain.    Marland Kitchen LORazepam (ATIVAN) 0.5 MG tablet Take 1 tablet (0.5 mg total) by mouth every 6 (six) hours as needed (Nausea or vomiting). 30 tablet 0  . metoprolol succinate (TOPROL XL) 100 MG 24 hr tablet Take 1 tablet (100 mg total) by mouth 2 (two) times daily. Take with or immediately following a meal. 180 tablet 3  . potassium chloride SA (K-DUR,KLOR-CON) 20 MEQ tablet Take 1 tablet (20 mEq total) by mouth 2 (two) times daily. 180 tablet 0  . prochlorperazine (COMPAZINE) 10 MG tablet Take 1 tablet (10 mg total) by mouth every 6 (six) hours as needed (Nausea or vomiting). 30 tablet 1  . ranitidine (ZANTAC) 150 MG tablet Take 150 mg by mouth daily as needed for heartburn.    . vitamin B-12 (CYANOCOBALAMIN) 100 MCG tablet Take 100 mcg by mouth daily.    . metoCLOPramide (REGLAN) 10 MG tablet Take 1 tablet (10 mg total) by mouth 3 (three) times daily before meals for 14 days. 42 tablet 0  . nitroGLYCERIN (NITROSTAT) 0.3 MG SL tablet Place 1 tablet (0.3 mg total) under the tongue every 5 (five) minutes as needed for chest pain.  (Patient not taking: Reported on 02/17/2018) 30 tablet 0   No current facility-administered medications for this visit.    Facility-Administered Medications Ordered in Other Visits  Medication Dose Route Frequency Provider Last Rate Last Dose  . sodium chloride flush (NS) 0.9 % injection 10 mL  10 mL Intravenous PRN [name], MD   10 mL at 02/17/18 0937    PHYSICAL EXAMINATION: ECOG PERFORMANCE STATUS: 1 - Symptomatic but completely ambulatory  Today's Vitals   02/17/18 0957 02/17/18 0958  BP: (!) 149/81   Pulse: 64   Resp: 18   Temp: 98.2 F (36.8 C)   TempSrc: Oral   SpO2: 99%   Weight: 243 lb (110.2 kg)   Height: 5'  11" (1.803 m)   PainSc: 0-No pain 0-No pain   Body mass index is 33.89 kg/m.  Filed Weights   02/17/18 0957  Weight: 243 lb (110.2 kg)    GENERAL: alert, no distress and comfortable SKIN: skin color, texture, turgor are normal, no rashes or significant lesions EYES: conjunctiva are pink and non-injected, sclera clear OROPHARYNX: no exudate, no erythema; lips, buccal mucosa, and tongue normal  NECK: supple, non-tender LYMPH:  no palpable lymphadenopathy in the cervical or axillary  LUNGS: clear to auscultation with normal breathing effort HEART: regular rate & rhythm and no murmurs and no lower extremity edema ABDOMEN: soft, non-tender, non-distended, normal bowel sounds Musculoskeletal: no cyanosis of digits and no clubbing  PSYCH: alert & oriented x 3, fluent speech NEURO: no focal motor/sensory deficits  LABORATORY DATA:  I have reviewed the data as listed    Component Value Date/Time   NA 137 02/17/2018 0930   NA 141 08/29/2017 1155   K 3.6 02/17/2018 0930   CL 102 02/17/2018 0930   CO2 27 02/17/2018 0930   GLUCOSE 111 (H) 02/17/2018 0930   BUN 13 02/17/2018 0930   BUN 16 08/29/2017 1155   CREATININE 0.74 02/17/2018 0930   CREATININE 0.83 09/27/2014 0001   CALCIUM 9.2 02/17/2018 0930   PROT 6.1 (L) 02/17/2018 0930   PROT 7.4 08/29/2017  1155   ALBUMIN 3.6 02/17/2018 0930   ALBUMIN 4.1 08/29/2017 1155   AST 13 (L) 02/17/2018 0930   ALT 21 02/17/2018 0930   ALKPHOS 80 02/17/2018 0930   BILITOT 0.5 02/17/2018 0930   GFRNONAA >60 02/17/2018 0930   GFRAA >60 02/17/2018 0930    No results found for: SPEP, UPEP  Lab Results  Component Value Date   WBC 2.1 (L) 02/17/2018   NEUTROABS 0.5 (L) 02/17/2018   HGB 10.0 (L) 02/17/2018   HCT 31.8 (L) 02/17/2018   MCV 88.3 02/17/2018   PLT 180 02/17/2018      Chemistry      Component Value Date/Time   NA 137 02/17/2018 0930   NA 141 08/29/2017 1155   K 3.6 02/17/2018 0930   CL 102 02/17/2018 0930   CO2 27 02/17/2018 0930   BUN 13 02/17/2018 0930   BUN 16 08/29/2017 1155   CREATININE 0.74 02/17/2018 0930   CREATININE 0.83 09/27/2014 0001      Component Value Date/Time   CALCIUM 9.2 02/17/2018 0930   ALKPHOS 80 02/17/2018 0930   AST 13 (L) 02/17/2018 0930   ALT 21 02/17/2018 0930   BILITOT 0.5 02/17/2018 0930       RADIOGRAPHIC STUDIES: I have personally reviewed the radiological images as listed below and agreed with the findings in the report. Dg Chest Port 1 View  Result Date: 01/30/2018 CLINICAL DATA:  Supraventricular tachycardia. EXAM: PORTABLE CHEST 1 VIEW COMPARISON:  01/24/2018. FINDINGS: Stable enlarged cardiac silhouette. Interval mild prominence of the pulmonary vasculature and interval increase in prominence of the interstitial markings with a somewhat patchy appearance in the right lung and left mid and lower lung zones. Possible minimal bilateral pleural fluid. Right jugular porta catheter tip in the inferior aspect of the superior vena cava near the cavoatrial junction. Unremarkable bones. IMPRESSION: Interval changes of congestive heart failure with stable cardiomegaly. Underlying pneumonia or pneumonitis cannot be excluded. Electronically Signed   By: Claudie Revering M.D.   On: 01/30/2018 21:40   Dg Chest Port 1 View  Result Date: 01/24/2018 CLINICAL  DATA:  Supraventricular tachycardia. EXAM: PORTABLE  CHEST 1 VIEW COMPARISON:  01/21/2018 FINDINGS: Chronic cardiomegaly and vascular pedicle widening. Porta catheter from the left with tip at the SVC. Interstitial coarsening that is stable to improved from prior. There is no consolidation, effusion, or pneumothorax. IMPRESSION: 1. Stable to improved appearance of the chest compared to 3 days ago. 2. Cardiomegaly, low volumes, and generalized interstitial opacity. Electronically Signed   By: Monte Fantasia M.D.   On: 01/24/2018 11:48   Dg Chest Portable 1 View  Result Date: 01/21/2018 CLINICAL DATA:  Chest pain. Tachycardia. History of lymphoma. EXAM: PORTABLE CHEST 1 VIEW COMPARISON:  Chest radiographs 08/29/2017. PET-CT 01/10/2018. FINDINGS: A left jugular Port-A-Cath terminates near the superior cavoatrial junction. The cardiac silhouette is normal in size. Lung volumes are low with increased interstitial markings diffusely throughout both lungs. No sizable pleural effusion or pneumothorax is identified. No acute osseous abnormality is seen. IMPRESSION: Low lung volumes with increased interstitial markings throughout both lungs, which could reflect atypical infection or edema. Electronically Signed   By: Logan Bores M.D.   On: 01/21/2018 13:12

## 2018-02-17 NOTE — Telephone Encounter (Signed)
Followed up to see how pt did over the w/e. Wife reports pt did great w/o issues. She appreciates the follow up

## 2018-02-17 NOTE — Patient Instructions (Signed)
Implanted Port Insertion, Care After  This sheet gives you information about how to care for yourself after your procedure. Your health care provider may also give you more specific instructions. If you have problems or questions, contact your health care provider.  What can I expect after the procedure?  After the procedure, it is common to have:  · Discomfort at the port insertion site.  · Bruising on the skin over the port. This should improve over 3-4 days.  Follow these instructions at home:  Port care  · After your port is placed, you will get a manufacturer's information card. The card has information about your port. Keep this card with you at all times.  · Take care of the port as told by your health care provider. Ask your health care provider if you or a family member can get training for taking care of the port at home. A home health care nurse may also take care of the port.  · Make sure to remember what type of port you have.  Incision care         · Follow instructions from your health care provider about how to take care of your port insertion site. Make sure you:  ? Wash your hands with soap and water before and after you change your bandage (dressing). If soap and water are not available, use hand sanitizer.  ? Change your dressing as told by your health care provider.  ? Leave stitches (sutures), skin glue, or adhesive strips in place. These skin closures may need to stay in place for 2 weeks or longer. If adhesive strip edges start to loosen and curl up, you may trim the loose edges. Do not remove adhesive strips completely unless your health care provider tells you to do that.  · Check your port insertion site every day for signs of infection. Check for:  ? Redness, swelling, or pain.  ? Fluid or blood.  ? Warmth.  ? Pus or a bad smell.  Activity  · Return to your normal activities as told by your health care provider. Ask your health care provider what activities are safe for you.  · Do not  lift anything that is heavier than 10 lb (4.5 kg), or the limit that you are told, until your health care provider says that it is safe.  General instructions  · Take over-the-counter and prescription medicines only as told by your health care provider.  · Do not take baths, swim, or use a hot tub until your health care provider approves. Ask your health care provider if you may take showers. You may only be allowed to take sponge baths.  · Do not drive for 24 hours if you were given a sedative during your procedure.  · Wear a medical alert bracelet in case of an emergency. This will tell any health care providers that you have a port.  · Keep all follow-up visits as told by your health care provider. This is important.  Contact a health care provider if:  · You cannot flush your port with saline as directed, or you cannot draw blood from the port.  · You have a fever or chills.  · You have redness, swelling, or pain around your port insertion site.  · You have fluid or blood coming from your port insertion site.  · Your port insertion site feels warm to the touch.  · You have pus or a bad smell coming from the port   insertion site.  Get help right away if:  · You have chest pain or shortness of breath.  · You have bleeding from your port that you cannot control.  Summary  · Take care of the port as told by your health care provider. Keep the manufacturer's information card with you at all times.  · Change your dressing as told by your health care provider.  · Contact a health care provider if you have a fever or chills or if you have redness, swelling, or pain around your port insertion site.  · Keep all follow-up visits as told by your health care provider.  This information is not intended to replace advice given to you by your health care provider. Make sure you discuss any questions you have with your health care provider.  Document Released: 10/22/2012 Document Revised: 07/30/2017 Document Reviewed:  07/30/2017  Elsevier Interactive Patient Education © 2019 [employer]

## 2018-02-18 ENCOUNTER — Inpatient Hospital Stay: Payer: Medicare Other

## 2018-02-18 ENCOUNTER — Other Ambulatory Visit: Payer: Self-pay

## 2018-02-18 VITALS — BP 137/74 | HR 66 | Temp 97.7°F | Resp 16

## 2018-02-18 DIAGNOSIS — Z5111 Encounter for antineoplastic chemotherapy: Secondary | ICD-10-CM | POA: Diagnosis not present

## 2018-02-18 DIAGNOSIS — C8190 Hodgkin lymphoma, unspecified, unspecified site: Secondary | ICD-10-CM | POA: Diagnosis not present

## 2018-02-18 DIAGNOSIS — D6481 Anemia due to antineoplastic chemotherapy: Secondary | ICD-10-CM | POA: Diagnosis not present

## 2018-02-18 DIAGNOSIS — T451X5A Adverse effect of antineoplastic and immunosuppressive drugs, initial encounter: Secondary | ICD-10-CM | POA: Diagnosis not present

## 2018-02-18 DIAGNOSIS — Z7689 Persons encountering health services in other specified circumstances: Secondary | ICD-10-CM | POA: Diagnosis not present

## 2018-02-18 DIAGNOSIS — D701 Agranulocytosis secondary to cancer chemotherapy: Secondary | ICD-10-CM | POA: Diagnosis not present

## 2018-02-18 MED ORDER — DOXORUBICIN HCL CHEMO IV INJECTION 2 MG/ML
25.0000 mg/m2 | Freq: Once | INTRAVENOUS | Status: AC
  Administered 2018-02-18: 60 mg via INTRAVENOUS
  Filled 2018-02-18: qty 30

## 2018-02-18 MED ORDER — HEPARIN SOD (PORK) LOCK FLUSH 100 UNIT/ML IV SOLN
500.0000 [IU] | Freq: Once | INTRAVENOUS | Status: AC | PRN
  Administered 2018-02-18: 500 [IU]
  Filled 2018-02-18: qty 5

## 2018-02-18 MED ORDER — VINBLASTINE SULFATE CHEMO INJECTION 1 MG/ML
6.1700 mg/m2 | Freq: Once | INTRAVENOUS | Status: AC
  Administered 2018-02-18: 15 mg via INTRAVENOUS
  Filled 2018-02-18: qty 15

## 2018-02-18 MED ORDER — SODIUM CHLORIDE 0.9 % IV SOLN
Freq: Once | INTRAVENOUS | Status: AC
  Administered 2018-02-18: 10:00:00 via INTRAVENOUS
  Filled 2018-02-18: qty 5

## 2018-02-18 MED ORDER — SODIUM CHLORIDE 0.9% FLUSH
10.0000 mL | INTRAVENOUS | Status: DC | PRN
  Administered 2018-02-18: 10 mL
  Filled 2018-02-18: qty 10

## 2018-02-18 MED ORDER — PALONOSETRON HCL INJECTION 0.25 MG/5ML
INTRAVENOUS | Status: AC
  Filled 2018-02-18: qty 5

## 2018-02-18 MED ORDER — SODIUM CHLORIDE 0.9 % IV SOLN
370.0000 mg/m2 | Freq: Once | INTRAVENOUS | Status: AC
  Administered 2018-02-18: 900 mg via INTRAVENOUS
  Filled 2018-02-18: qty 90

## 2018-02-18 MED ORDER — SODIUM CHLORIDE 0.9 % IV SOLN
Freq: Once | INTRAVENOUS | Status: AC
  Administered 2018-02-18: 09:00:00 via INTRAVENOUS
  Filled 2018-02-18: qty 250

## 2018-02-18 MED ORDER — PALONOSETRON HCL INJECTION 0.25 MG/5ML
0.2500 mg | Freq: Once | INTRAVENOUS | Status: AC
  Administered 2018-02-18: 0.25 mg via INTRAVENOUS

## 2018-02-18 NOTE — Patient Instructions (Signed)
[facility] Discharge Instructions for Patients Receiving Chemotherapy  Today you received the following chemotherapy agents Adriamycin, Velban, DTIC  To help prevent nausea and vomiting after your treatment, we encourage you to take your nausea medication    If you develop nausea and vomiting that is not controlled by your nausea medication, call the clinic.   BELOW ARE SYMPTOMS THAT SHOULD BE REPORTED IMMEDIATELY:  *FEVER GREATER THAN 100.5 F  *CHILLS WITH OR WITHOUT FEVER  NAUSEA AND VOMITING THAT IS NOT CONTROLLED WITH YOUR NAUSEA MEDICATION  *UNUSUAL SHORTNESS OF BREATH  *UNUSUAL BRUISING OR BLEEDING  TENDERNESS IN MOUTH AND THROAT WITH OR WITHOUT PRESENCE OF ULCERS  *URINARY PROBLEMS  *BOWEL PROBLEMS  UNUSUAL RASH Items with * indicate a potential emergency and should be followed up as soon as possible.  Feel free to call the clinic should you have any questions or concerns. The clinic phone number is [phone].  Please show the [facility] at check-in to the Emergency Department and triage nurse.

## 2018-02-18 NOTE — Progress Notes (Signed)
CBC reviewed.  Noted leukopenia and neutropenia.  "okay to proceed with C3D14 of AVD on 02/18/2018" Per Dr Maylon Peppers progress note on 02/17/2018

## 2018-02-24 ENCOUNTER — Telehealth: Payer: Self-pay | Admitting: *Deleted

## 2018-02-24 ENCOUNTER — Ambulatory Visit: Payer: Medicare Other

## 2018-02-24 NOTE — Telephone Encounter (Signed)
Message received from patient stating that he would like to cancel his appt for IVF's today.  Appointment canceled.

## 2018-02-25 ENCOUNTER — Inpatient Hospital Stay: Payer: Medicare Other

## 2018-02-25 ENCOUNTER — Other Ambulatory Visit: Payer: Self-pay | Admitting: *Deleted

## 2018-02-25 VITALS — BP 116/77 | HR 70 | Temp 98.0°F | Wt 243.0 lb

## 2018-02-25 DIAGNOSIS — Z7689 Persons encountering health services in other specified circumstances: Secondary | ICD-10-CM | POA: Diagnosis not present

## 2018-02-25 DIAGNOSIS — D701 Agranulocytosis secondary to cancer chemotherapy: Secondary | ICD-10-CM | POA: Diagnosis not present

## 2018-02-25 DIAGNOSIS — C8190 Hodgkin lymphoma, unspecified, unspecified site: Secondary | ICD-10-CM

## 2018-02-25 DIAGNOSIS — D6481 Anemia due to antineoplastic chemotherapy: Secondary | ICD-10-CM | POA: Diagnosis not present

## 2018-02-25 DIAGNOSIS — T451X5A Adverse effect of antineoplastic and immunosuppressive drugs, initial encounter: Secondary | ICD-10-CM | POA: Diagnosis not present

## 2018-02-25 DIAGNOSIS — Z5111 Encounter for antineoplastic chemotherapy: Secondary | ICD-10-CM | POA: Diagnosis not present

## 2018-02-25 LAB — CBC WITH DIFFERENTIAL (CANCER CENTER ONLY)
Abs Immature Granulocytes: 0.02 10*3/uL (ref 0.00–0.07)
Basophils Absolute: 0 10*3/uL (ref 0.0–0.1)
Basophils Relative: 0 %
EOS ABS: 0 10*3/uL (ref 0.0–0.5)
EOS PCT: 2 %
HCT: 32.7 % — ABNORMAL LOW (ref 39.0–52.0)
Hemoglobin: 10.7 g/dL — ABNORMAL LOW (ref 13.0–17.0)
Immature Granulocytes: 2 %
Lymphocytes Relative: 43 %
Lymphs Abs: 0.6 10*3/uL — ABNORMAL LOW (ref 0.7–4.0)
MCH: 27.8 pg (ref 26.0–34.0)
MCHC: 32.7 g/dL (ref 30.0–36.0)
MCV: 84.9 fL (ref 80.0–100.0)
Monocytes Absolute: 0 10*3/uL — ABNORMAL LOW (ref 0.1–1.0)
Monocytes Relative: 2 %
Neutro Abs: 0.7 10*3/uL — ABNORMAL LOW (ref 1.7–7.7)
Neutrophils Relative %: 51 %
Platelet Count: 178 10*3/uL (ref 150–400)
RBC: 3.85 MIL/uL — ABNORMAL LOW (ref 4.22–5.81)
RDW: 15.9 % — ABNORMAL HIGH (ref 11.5–15.5)
WBC Count: 1.3 10*3/uL — ABNORMAL LOW (ref 4.0–10.5)
nRBC: 0 % (ref 0.0–0.2)

## 2018-02-25 LAB — CMP (CANCER CENTER ONLY)
ALT: 72 U/L — ABNORMAL HIGH (ref 0–44)
AST: 16 U/L (ref 15–41)
Albumin: 3.7 g/dL (ref 3.5–5.0)
Alkaline Phosphatase: 62 U/L (ref 38–126)
Anion gap: 8 (ref 5–15)
BUN: 24 mg/dL — ABNORMAL HIGH (ref 8–23)
CO2: 28 mmol/L (ref 22–32)
Calcium: 9 mg/dL (ref 8.9–10.3)
Chloride: 96 mmol/L — ABNORMAL LOW (ref 98–111)
Creatinine: 0.76 mg/dL (ref 0.61–1.24)
GFR, Est AFR Am: >60 mL/min (ref >60)
GFR, Estimated: >60 mL/min (ref >60)
Glucose, Bld: 168 mg/dL — ABNORMAL HIGH (ref 70–99)
Potassium: 3.5 mmol/L (ref 3.5–5.1)
SODIUM: 132 mmol/L — AB (ref 135–145)
Total Bilirubin: 0.9 mg/dL (ref 0.3–1.2)
Total Protein: 5.9 g/dL — ABNORMAL LOW (ref 6.5–8.1)

## 2018-02-25 MED ORDER — SODIUM CHLORIDE 0.9 % IV SOLN
Freq: Once | INTRAVENOUS | Status: AC
  Administered 2018-02-25: 11:00:00 via INTRAVENOUS
  Filled 2018-02-25: qty 250

## 2018-02-25 MED ORDER — SODIUM CHLORIDE 0.9% FLUSH
10.0000 mL | Freq: Once | INTRAVENOUS | Status: AC | PRN
  Administered 2018-02-25: 10 mL
  Filled 2018-02-25: qty 10

## 2018-02-25 MED ORDER — HEPARIN SOD (PORK) LOCK FLUSH 100 UNIT/ML IV SOLN
500.0000 [IU] | Freq: Once | INTRAVENOUS | Status: AC | PRN
  Administered 2018-02-25: 500 [IU]
  Filled 2018-02-25: qty 5

## 2018-02-25 NOTE — Patient Instructions (Signed)
Dehydration, Adult  Dehydration is when there is not enough fluid or water in your body. This happens when you lose more fluids than you take in. Dehydration can range from mild to very bad. It should be treated right away to keep it from getting very bad. Symptoms of mild dehydration may include:  Thirst.  Dry lips.  Slightly dry mouth.  Dry, warm skin.  Dizziness. Symptoms of moderate dehydration may include:  Very dry mouth.  Muscle cramps.  Dark pee (urine). Pee may be the color of tea.  Your body making less pee.  Your eyes making fewer tears.  Heartbeat that is uneven or faster than normal (palpitations).  Headache.  Light-headedness, especially when you stand up from sitting.  Fainting (syncope). Symptoms of very bad dehydration may include:  Changes in skin, such as: ? Cold and clammy skin. ? Blotchy (mottled) or pale skin. ? Skin that does not quickly return to normal after being lightly pinched and let go (poor skin turgor).  Changes in body fluids, such as: ? Feeling very thirsty. ? Your eyes making fewer tears. ? Not sweating when body temperature is high, such as in hot weather. ? Your body making very little pee.  Changes in vital signs, such as: ? Weak pulse. ? Pulse that is more than 100 beats a minute when you are sitting still. ? Fast breathing. ? Low blood pressure.  Other changes, such as: ? Sunken eyes. ? Cold hands and feet. ? Confusion. ? Lack of energy (lethargy). ? Trouble waking up from sleep. ? Short-term weight loss. ? Unconsciousness. Follow these instructions at home:   If told by your doctor, drink an ORS: ? Make an ORS by using instructions on the package. ? Start by drinking small amounts, about  cup (120 mL) every 5-10 minutes. ? Slowly drink more until you have had the amount that your doctor said to have.  Drink enough clear fluid to keep your pee clear or pale yellow. If you were told to drink an ORS, finish the  ORS first, then start slowly drinking clear fluids. Drink fluids such as: ? Water. Do not drink only water by itself. Doing that can make the salt (sodium) level in your body get too low (hyponatremia). ? Ice chips. ? Fruit juice that you have added water to (diluted). ? Low-calorie sports drinks.  Avoid: ? Alcohol. ? Drinks that have a lot of sugar. These include high-calorie sports drinks, fruit juice that does not have water added, and soda. ? Caffeine. ? Foods that are greasy or have a lot of fat or sugar.  Take over-the-counter and prescription medicines only as told by your doctor.  Do not take salt tablets. Doing that can make the salt level in your body get too high (hypernatremia).  Eat foods that have minerals (electrolytes). Examples include bananas, oranges, potatoes, tomatoes, and spinach.  Keep all follow-up visits as told by your doctor. This is important. Contact a doctor if:  You have belly (abdominal) pain that: ? Gets worse. ? Stays in one area (localizes).  You have a rash.  You have a stiff neck.  You get angry or annoyed more easily than normal (irritability).  You are more sleepy than normal.  You have a harder time waking up than normal.  You feel: ? Weak. ? Dizzy. ? Very thirsty.  You have peed (urinated) only a small amount of very dark pee during 6-8 hours. Get help right away if:  You have   symptoms of very bad dehydration.  You cannot drink fluids without throwing up (vomiting).  Your symptoms get worse with treatment.  You have a fever.  You have a very bad headache.  You are throwing up or having watery poop (diarrhea) and it: ? Gets worse. ? Does not go away.  You have blood or something green (bile) in your throw-up.  You have blood in your poop (stool). This may cause poop to look black and tarry.  You have not peed in 6-8 hours.  You pass out (faint).  Your heart rate when you are sitting still is more than 100 beats a  minute.  You have trouble breathing. This information is not intended to replace advice given to you by your health care provider. Make sure you discuss any questions you have with your health care provider. Document Released: 10/28/2008 Document Revised: 07/22/2015 Document Reviewed: 02/25/2015 Elsevier Interactive Patient Education  2019 [employer]  

## 2018-02-26 ENCOUNTER — Other Ambulatory Visit: Payer: Self-pay

## 2018-02-26 ENCOUNTER — Inpatient Hospital Stay: Payer: Medicare Other

## 2018-02-26 VITALS — BP 122/69 | HR 64 | Temp 98.0°F | Resp 18

## 2018-02-26 DIAGNOSIS — Z5111 Encounter for antineoplastic chemotherapy: Secondary | ICD-10-CM | POA: Diagnosis not present

## 2018-02-26 DIAGNOSIS — C8190 Hodgkin lymphoma, unspecified, unspecified site: Secondary | ICD-10-CM

## 2018-02-26 DIAGNOSIS — D701 Agranulocytosis secondary to cancer chemotherapy: Secondary | ICD-10-CM | POA: Diagnosis not present

## 2018-02-26 DIAGNOSIS — T451X5A Adverse effect of antineoplastic and immunosuppressive drugs, initial encounter: Secondary | ICD-10-CM | POA: Diagnosis not present

## 2018-02-26 DIAGNOSIS — Z7689 Persons encountering health services in other specified circumstances: Secondary | ICD-10-CM | POA: Diagnosis not present

## 2018-02-26 DIAGNOSIS — D6481 Anemia due to antineoplastic chemotherapy: Secondary | ICD-10-CM | POA: Diagnosis not present

## 2018-02-26 MED ORDER — SODIUM CHLORIDE 0.9% FLUSH
10.0000 mL | Freq: Once | INTRAVENOUS | Status: AC | PRN
  Administered 2018-02-26: 10 mL
  Filled 2018-02-26: qty 10

## 2018-02-26 MED ORDER — SODIUM CHLORIDE 0.9 % IV SOLN
Freq: Once | INTRAVENOUS | Status: AC
  Administered 2018-02-26: 11:00:00 via INTRAVENOUS
  Filled 2018-02-26: qty 250

## 2018-02-26 MED ORDER — HEPARIN SOD (PORK) LOCK FLUSH 100 UNIT/ML IV SOLN
500.0000 [IU] | Freq: Once | INTRAVENOUS | Status: AC | PRN
  Administered 2018-02-26: 500 [IU]
  Filled 2018-02-26: qty 5

## 2018-02-26 NOTE — Patient Instructions (Signed)
Dehydration, Adult  Dehydration is a condition in which there is not enough fluid or water in the body. This happens when you lose more fluids than you take in. Important organs, such as the kidneys, brain, and heart, cannot function without a proper amount of fluids. Any loss of fluids from the body can lead to dehydration. Dehydration can range from mild to severe. This condition should be treated right away to prevent it from becoming severe. What are the causes? This condition may be caused by:  Vomiting.  Diarrhea.  Excessive sweating, such as from heat exposure or exercise.  Not drinking enough fluid, especially: ? When ill. ? While doing activity that requires a lot of energy.  Excessive urination.  Fever.  Infection.  Certain medicines, such as medicines that cause the body to lose excess fluid (diuretics).  Inability to access safe drinking water.  Reduced physical ability to get adequate water and food. What increases the risk? This condition is more likely to develop in people:  Who have a poorly controlled long-term (chronic) illness, such as diabetes, heart disease, or kidney disease.  Who are age 65 or older.  Who are disabled.  Who live in a place with high altitude.  Who play endurance sports. What are the signs or symptoms? Symptoms of mild dehydration may include:  Thirst.  Dry lips.  Slightly dry mouth.  Dry, warm skin.  Dizziness. Symptoms of moderate dehydration may include:  Very dry mouth.  Muscle cramps.  Dark urine. Urine may be the color of tea.  Decreased urine production.  Decreased tear production.  Heartbeat that is irregular or faster than normal (palpitations).  Headache.  Light-headedness, especially when you stand up from a sitting position.  Fainting (syncope). Symptoms of severe dehydration may include:  Changes in skin, such as: ? Cold and clammy skin. ? Blotchy (mottled) or pale skin. ? Skin that does  not quickly return to normal after being lightly pinched and released (poor skin turgor).  Changes in body fluids, such as: ? Extreme thirst. ? No tear production. ? Inability to sweat when body temperature is high, such as in hot weather. ? Very little urine production.  Changes in vital signs, such as: ? Weak pulse. ? Pulse that is more than 100 beats a minute when sitting still. ? Rapid breathing. ? Low blood pressure.  Other changes, such as: ? Sunken eyes. ? Cold hands and feet. ? Confusion. ? Lack of energy (lethargy). ? Difficulty waking up from sleep. ? Short-term weight loss. ? Unconsciousness. How is this diagnosed? This condition is diagnosed based on your symptoms and a physical exam. Blood and urine tests may be done to help confirm the diagnosis. How is this treated? Treatment for this condition depends on the severity. Mild or moderate dehydration can often be treated at home. Treatment should be started right away. Do not wait until dehydration becomes severe. Severe dehydration is an emergency and it needs to be treated in a hospital. Treatment for mild dehydration may include:  Drinking more fluids.  Replacing salts and minerals in your blood (electrolytes) that you may have lost. Treatment for moderate dehydration may include:  Drinking an oral rehydration solution (ORS). This is a drink that helps you replace fluids and electrolytes (rehydrate). It can be found at pharmacies and retail stores. Treatment for severe dehydration may include:  Receiving fluids through an IV tube.  Receiving an electrolyte solution through a feeding tube that is passed through your nose and   into your stomach (nasogastric tube, or NG tube).  Correcting any abnormalities in electrolytes.  Treating the underlying cause of dehydration. Follow these instructions at home:  If directed by your health care provider, drink an ORS: ? Make an ORS by following instructions on the  package. ? Start by drinking small amounts, about  cup (120 mL) every 5-10 minutes. ? Slowly increase how much you drink until you have taken the amount recommended by your health care provider.  Drink enough clear fluid to keep your urine clear or pale yellow. If you were told to drink an ORS, finish the ORS first, then start slowly drinking other clear fluids. Drink fluids such as: ? Water. Do not drink only water. Doing that can lead to having too little salt (sodium) in the body (hyponatremia). ? Ice chips. ? Fruit juice that you have added water to (diluted fruit juice). ? Low-calorie sports drinks.  Avoid: ? Alcohol. ? Drinks that contain a lot of sugar. These include high-calorie sports drinks, fruit juice that is not diluted, and soda. ? Caffeine. ? Foods that are greasy or contain a lot of fat or sugar.  Take over-the-counter and prescription medicines only as told by your health care provider.  Do not take sodium tablets. This can lead to having too much sodium in the body (hypernatremia).  Eat foods that contain a healthy balance of electrolytes, such as bananas, oranges, potatoes, tomatoes, and spinach.  Keep all follow-up visits as told by your health care provider. This is important. Contact a health care provider if:  You have abdominal pain that: ? Gets worse. ? Stays in one area (localizes).  You have a rash.  You have a stiff neck.  You are more irritable than usual.  You are sleepier or more difficult to wake up than usual.  You feel weak or dizzy.  You feel very thirsty.  You have urinated only a small amount of very dark urine over 6-8 hours. Get help right away if:  You have symptoms of severe dehydration.  You cannot drink fluids without vomiting.  Your symptoms get worse with treatment.  You have a fever.  You have a severe headache.  You have vomiting or diarrhea that: ? Gets worse. ? Does not go away.  You have blood or green matter  (bile) in your vomit.  You have blood in your stool. This may cause stool to look black and tarry.  You have not urinated in 6-8 hours.  You faint.  Your heart rate while sitting still is over 100 beats a minute.  You have trouble breathing. This information is not intended to replace advice given to you by your health care provider. Make sure you discuss any questions you have with your health care provider. Document Released: 01/01/2005 Document Revised: 07/29/2015 Document Reviewed: 02/25/2015 Elsevier Interactive Patient Education  2019 [employer]  

## 2018-02-27 ENCOUNTER — Inpatient Hospital Stay: Payer: Medicare Other

## 2018-02-28 ENCOUNTER — Inpatient Hospital Stay: Payer: Medicare Other

## 2018-02-28 ENCOUNTER — Other Ambulatory Visit: Payer: Self-pay | Admitting: Medical

## 2018-02-28 VITALS — BP 121/60 | HR 64 | Temp 98.6°F | Resp 20

## 2018-02-28 DIAGNOSIS — C8190 Hodgkin lymphoma, unspecified, unspecified site: Secondary | ICD-10-CM

## 2018-02-28 DIAGNOSIS — F32A Depression, unspecified: Secondary | ICD-10-CM

## 2018-02-28 DIAGNOSIS — T451X5A Adverse effect of antineoplastic and immunosuppressive drugs, initial encounter: Secondary | ICD-10-CM | POA: Diagnosis not present

## 2018-02-28 DIAGNOSIS — D6481 Anemia due to antineoplastic chemotherapy: Secondary | ICD-10-CM | POA: Diagnosis not present

## 2018-02-28 DIAGNOSIS — F329 Major depressive disorder, single episode, unspecified: Secondary | ICD-10-CM

## 2018-02-28 DIAGNOSIS — Z7689 Persons encountering health services in other specified circumstances: Secondary | ICD-10-CM | POA: Diagnosis not present

## 2018-02-28 DIAGNOSIS — Z5111 Encounter for antineoplastic chemotherapy: Secondary | ICD-10-CM | POA: Diagnosis not present

## 2018-02-28 DIAGNOSIS — D701 Agranulocytosis secondary to cancer chemotherapy: Secondary | ICD-10-CM | POA: Diagnosis not present

## 2018-02-28 MED ORDER — HYDROCODONE-ACETAMINOPHEN 7.5-325 MG PO TABS: 1.0000 | ORAL_TABLET | Freq: Four times a day (QID) | ORAL | 0 refills | Status: DC | PRN

## 2018-02-28 MED ORDER — DULOXETINE HCL 30 MG PO CPEP: ORAL_CAPSULE | ORAL | 5 refills | Status: DC

## 2018-02-28 MED ORDER — SODIUM CHLORIDE 0.9 % IV SOLN
Freq: Once | INTRAVENOUS | Status: AC
  Administered 2018-02-28: 11:00:00 via INTRAVENOUS
  Filled 2018-02-28: qty 250

## 2018-02-28 MED ORDER — LORAZEPAM 0.5 MG PO TABS
0.5000 mg | ORAL_TABLET | Freq: Once | ORAL | Status: AC
  Administered 2018-02-28: 0.5 mg via ORAL

## 2018-02-28 MED ORDER — SODIUM CHLORIDE 0.9% FLUSH
10.0000 mL | Freq: Once | INTRAVENOUS | Status: AC | PRN
  Administered 2018-02-28: 10 mL
  Filled 2018-02-28: qty 10

## 2018-02-28 MED ORDER — HEPARIN SOD (PORK) LOCK FLUSH 100 UNIT/ML IV SOLN
500.0000 [IU] | Freq: Once | INTRAVENOUS | Status: AC | PRN
  Administered 2018-02-28: 500 [IU]
  Filled 2018-02-28: qty 5

## 2018-03-03 ENCOUNTER — Other Ambulatory Visit: Payer: Self-pay

## 2018-03-03 ENCOUNTER — Encounter: Payer: Self-pay | Admitting: Hematology

## 2018-03-03 ENCOUNTER — Inpatient Hospital Stay (HOSPITAL_BASED_OUTPATIENT_CLINIC_OR_DEPARTMENT_OTHER): Payer: Medicare Other | Admitting: Hematology

## 2018-03-03 ENCOUNTER — Inpatient Hospital Stay: Payer: Medicare Other

## 2018-03-03 ENCOUNTER — Encounter: Payer: Self-pay | Admitting: *Deleted

## 2018-03-03 VITALS — BP 133/82 | HR 65 | Temp 98.0°F | Resp 18 | Wt 233.8 lb

## 2018-03-03 DIAGNOSIS — E871 Hypo-osmolality and hyponatremia: Secondary | ICD-10-CM

## 2018-03-03 DIAGNOSIS — E46 Unspecified protein-calorie malnutrition: Secondary | ICD-10-CM

## 2018-03-03 DIAGNOSIS — C8190 Hodgkin lymphoma, unspecified, unspecified site: Secondary | ICD-10-CM | POA: Diagnosis not present

## 2018-03-03 DIAGNOSIS — T451X5A Adverse effect of antineoplastic and immunosuppressive drugs, initial encounter: Secondary | ICD-10-CM | POA: Diagnosis not present

## 2018-03-03 DIAGNOSIS — I471 Supraventricular tachycardia: Secondary | ICD-10-CM

## 2018-03-03 DIAGNOSIS — R05 Cough: Secondary | ICD-10-CM

## 2018-03-03 DIAGNOSIS — I509 Heart failure, unspecified: Secondary | ICD-10-CM

## 2018-03-03 DIAGNOSIS — Z7689 Persons encountering health services in other specified circumstances: Secondary | ICD-10-CM

## 2018-03-03 DIAGNOSIS — I700 Atherosclerosis of aorta: Secondary | ICD-10-CM

## 2018-03-03 DIAGNOSIS — D6481 Anemia due to antineoplastic chemotherapy: Secondary | ICD-10-CM

## 2018-03-03 DIAGNOSIS — Z79899 Other long term (current) drug therapy: Secondary | ICD-10-CM

## 2018-03-03 DIAGNOSIS — D701 Agranulocytosis secondary to cancer chemotherapy: Secondary | ICD-10-CM | POA: Diagnosis not present

## 2018-03-03 DIAGNOSIS — F329 Major depressive disorder, single episode, unspecified: Secondary | ICD-10-CM | POA: Diagnosis not present

## 2018-03-03 DIAGNOSIS — Z5111 Encounter for antineoplastic chemotherapy: Secondary | ICD-10-CM | POA: Diagnosis not present

## 2018-03-03 DIAGNOSIS — Z7982 Long term (current) use of aspirin: Secondary | ICD-10-CM

## 2018-03-03 DIAGNOSIS — E861 Hypovolemia: Secondary | ICD-10-CM

## 2018-03-03 DIAGNOSIS — F32A Depression, unspecified: Secondary | ICD-10-CM

## 2018-03-03 LAB — CMP (CANCER CENTER ONLY)
ALK PHOS: 113 U/L (ref 38–126)
ALT: 30 U/L (ref 0–44)
AST: 15 U/L (ref 15–41)
Albumin: 3.7 g/dL (ref 3.5–5.0)
Anion gap: 10 (ref 5–15)
BUN: 16 mg/dL (ref 8–23)
CO2: 25 mmol/L (ref 22–32)
Calcium: 9.3 mg/dL (ref 8.9–10.3)
Chloride: 96 mmol/L — ABNORMAL LOW (ref 98–111)
Creatinine: 0.83 mg/dL (ref 0.61–1.24)
GFR, Est AFR Am: >60 mL/min (ref >60)
GFR, Estimated: >60 mL/min (ref >60)
Glucose, Bld: 148 mg/dL — ABNORMAL HIGH (ref 70–99)
Potassium: 3.7 mmol/L (ref 3.5–5.1)
Sodium: 131 mmol/L — ABNORMAL LOW (ref 135–145)
Total Bilirubin: 0.8 mg/dL (ref 0.3–1.2)
Total Protein: 6.4 g/dL — ABNORMAL LOW (ref 6.5–8.1)

## 2018-03-03 LAB — CBC WITH DIFFERENTIAL (CANCER CENTER ONLY)
Abs Immature Granulocytes: 0.13 10*3/uL — ABNORMAL HIGH (ref 0.00–0.07)
Basophils Absolute: 0 10*3/uL (ref 0.0–0.1)
Basophils Relative: 1 %
Eosinophils Absolute: 0 10*3/uL (ref 0.0–0.5)
Eosinophils Relative: 0 %
HCT: 30.6 % — ABNORMAL LOW (ref 39.0–52.0)
HEMOGLOBIN: 10 g/dL — AB (ref 13.0–17.0)
Immature Granulocytes: 5 %
LYMPHS ABS: 0.8 10*3/uL (ref 0.7–4.0)
Lymphocytes Relative: 30 %
MCH: 27.9 pg (ref 26.0–34.0)
MCHC: 32.7 g/dL (ref 30.0–36.0)
MCV: 85.5 fL (ref 80.0–100.0)
Monocytes Absolute: 0.9 10*3/uL (ref 0.1–1.0)
Monocytes Relative: 33 %
Neutro Abs: 0.9 10*3/uL — ABNORMAL LOW (ref 1.7–7.7)
Neutrophils Relative %: 31 %
Platelet Count: 290 10*3/uL (ref 150–400)
RBC: 3.58 MIL/uL — ABNORMAL LOW (ref 4.22–5.81)
RDW: 16.6 % — ABNORMAL HIGH (ref 11.5–15.5)
WBC Count: 2.8 10*3/uL — ABNORMAL LOW (ref 4.0–10.5)
nRBC: 0 % (ref 0.0–0.2)

## 2018-03-03 LAB — MAGNESIUM: Magnesium: 1.8 mg/dL (ref 1.7–2.4)

## 2018-03-03 NOTE — Patient Instructions (Signed)

## 2018-03-03 NOTE — Progress Notes (Signed)
[facility] OFFICE PROGRESS NOTE  Patient Care Team: Tysinger, Camelia Eng, PA-C as PCP - General (Family Medicine) Nahser, Wonda Cheng, MD as PCP - Cardiology (Cardiology)  HEME/ONC OVERVIEW: 1. Stage III classic Hodgkin lymphoma, subtype NOS due to limited specimen -Bx-proven R cervical LN involvement in 09/2017; PET in 11/2017 showed cervical, mediastinal and upper abdominal LN involement; treatment delayed due to pt traveling on a cruise after diagnosis -11/2017 - present: ABVD -12/2017: PET after 2 cycles of ABVD showed interval CR  -Chemotherapy delayed due to SVT requiring ablation in 01/2018 -Mid-01/2018 - present: AVD   2. Port placement in 11/2017   CHEMOTHERAPY REGIMEN:  11/26/2017 - 01/07/2018: ABVD x 2 cycles, CR  02/04/2018 - present: AVD, plan for 4 cycles  ASSESSMENT & PLAN:   Stage III classic Hodgkin lymphoma, favorable risk  -S/p 3 cycles of chemotherapy so far -Labs notable for leukopenia and neutropenia; in the absence of suspicious symptoms for infection, okay to proceed with C4D1 of AVD on 03/04/2018 -Plan for a total of 4 cycles of AVD so long as the patient is able to tolerate treatment -PRN antiemetics: Zofran, Compazine, dexamethasone, and Ativan  Chemotherapy-associated neutropenia -Secondary to AVD -WBC 2.8k with ANC 900 -Patient denies any symptoms of infection -In the absence of infection, neutropenia alone would not delay treatment for Hodgkin lymphoma -We will monitor for now; no need for dose adjustment  Chemotherapy-associated anemia Secondary to AVD Hgb 10 today, stable -Patient denies any symptoms of bleeding -We will monitor for now; no need for dose adjustment  Hyponatremia -Secondary to mild hypovolemia  -Na 131, down from last week -I encouraged patient to increase his dietary salt intake -I discussed with the patient regarding administering 1L NS today; patient declined, but was agreeable to receive 1L NS with chemotherapy  on 03/04/2018 -IV fluid support during the off week of chemotherapy as scheduled  Malnutrition -Patient reports fluctuating weight since starting treatment; recording of the patient's weight showed approximately 8 to 10 pound weight loss since last week -I encouraged patient to maintain adequate p.o. intake as tolerated, as well as continuing nutritional supplements (goal 4-6 cans/day when not taking in adequate PO) -IV fluid support as outlined above   Depression -Patient reports feeling anhedonia, anxiety and depressed mood since starting treatment -I have prescribed Cymbalta 30 mg daily x1 week, followed by 60 mg daily -I discussed with the patient some of the benefits and potential side effects, including the increased risk of suicidal ideation; patient understands to call the clinic if he experiences any suicidal thoughts  Recent SVT -Status post ablation in mid-01/2018 -Patient currently denies any symptoms of recurrent palpitations -He is currently on diltiazem ER 240 mg daily, diltiazem regular release 30 mg nightly PRN, and the metoprolol -I encouraged patient to follow-up with his cardiologist for further management  All questions were answered. The patient knows to call the clinic with any problems, questions or concerns. No barriers to learning was detected.  Return in 1 weeks for labs, port flush and clinic follow-up for toxicity checks.  [name], MD 03/03/2018 10:34 AM  CHIEF COMPLAINT: "I am okay "  INTERVAL HISTORY: Bradley Novak returns to clinic for labs and follow-up prior to C4D1 of AVD.  Patient reports that he has been feeling persistent fatigue and decreased appetite.  He has had persistently depressed mood and anxiety, for which he was prescribed Cymbalta last week, but he has not started yet due to his concerns about the potential  side effects.  He also noticed a few shallow mouth ulcers, but they are relatively painless, and he has been using salt soda rinse every  day.  He denies any fever, chill, night sweats, lymphadenopathy, chest pain, dyspnea, abdominal pain, nausea, vomiting, diarrhea.  SUMMARY OF ONCOLOGIC HISTORY:   Hodgkin lymphoma ([place])   08/29/2017 Initial Diagnosis    Hodgkin lymphoma ([place])    10/07/2017 Imaging    CT neck w/ contrast: Enlarged nodes in the right jugular chain, posterior triangle, and supraclavicular fossa. The pattern is most concerning for lymphoma; multiple nodes are superficial and amenable to biopsy.  CT CAP w/ contrast: 1. Right supraclavicular and subcarinal adenopathy is identified. Differential considerations include lymphoproliferative disorder, metastatic adenopathy, granulomatous inflammation/infection or reactive adenopathy. Consider further evaluation with tissue sampling. 2. No acute findings, mass or adenopathy within the abdomen. 3. Lungs are clear.  No evidence for pneumonia.  4.  Aortic Atherosclerosis (ICD10-I70.0).    10/25/2017 Procedure    US-guided R cervical LN biopsy    10/25/2017 Pathology Results    (Accession: [id number]) Lymph node, needle/core biopsy, Right Cervical - CLASSIC HODGKIN LYMPHOMA - SEE COMMENT - CD30 (1%) - Unable to subtype due to limited sample specimen    11/12/2017 Cancer Staging    Staging form: Hodgkin and Non-Hodgkin Lymphoma, AJCC 8th Edition - Clinical stage from 11/12/2017: Stage II (Hodgkin lymphoma, A - Asymptomatic) - Signed by [name], MD on 11/12/2017    11/21/2017 -  Chemotherapy    The patient had DOXOrubicin (ADRIAMYCIN) chemo injection 60 mg, 25 mg/m2 = 60 mg, Intravenous,  Once, 0 of 4 cycles palonosetron (ALOXI) injection 0.25 mg, 0.25 mg, Intravenous,  Once, 0 of 4 cycles bleomycin (BLEOCIN) 24 Units in sodium chloride 0.9 % 50 mL chemo infusion, 10 Units/m2 = 24 Units, Intravenous,  Once, 0 of 4 cycles dacarbazine (DTIC) 910 mg in sodium chloride 0.9 % 250 mL chemo infusion, 375 mg/m2 = 910 mg, Intravenous,  Once, 0 of 4 cycles vinBLAStine  (VELBAN) 14.6 mg in sodium chloride 0.9 % 50 mL chemo infusion, 6 mg/m2 = 14.6 mg, Intravenous, Once, 0 of 4 cycles fosaprepitant (EMEND) 150 mg, dexamethasone (DECADRON) 12 mg in sodium chloride 0.9 % 145 mL IVPB, , Intravenous,  Once, 0 of 4 cycles  for chemotherapy treatment.     11/21/2017 Imaging    PET: IMPRESSION: 1. Multiple relatively small hypermetabolic lymph nodes in the RIGHT neck, mediastinum and limited involvement of the upper abdomen. Lymph node activity is high (greater than liver, [place] 4). 2. The most intense enlarged lymph node is anterior to the sternocleidomastoid muscle on the RIGHT. There are multiple assessable lymph nodes in the RIGHT cervical chain. 3. Minimal hypermetabolic adenopathy in the upper abdomen. No pelvic or inguinal metastatic adenopathy. 4. Normal spleen and bone marrow. 5. Single focus of metabolic activity in the sigmoid colon. Recommend screening colonoscopy if patient is not current for such.    01/10/2018 Imaging    PET (after 2 cycles of ABVD) IMPRESSION: 1. Interval complete response to therapy. No abnormal areas of persistent hypermetabolic adenopathy identified. 2.  Aortic Atherosclerosis (ICD10-I70.0). 3. Multi vessel coronary artery atherosclerotic calcifications.     REVIEW OF SYSTEMS:   Constitutional: ( - ) fevers, ( - )  chills , ( - ) night sweats Eyes: ( - ) blurriness of vision, ( - ) double vision, ( - ) watery eyes Ears, nose, mouth, throat, and face: ( - ) mucositis, ( - ) sore throat  Respiratory: ( - ) cough, ( - ) dyspnea, ( - ) wheezes Cardiovascular: ( - ) palpitation, ( - ) chest discomfort, ( - ) lower extremity swelling Gastrointestinal:  ( - ) nausea, ( - ) heartburn, ( - ) change in bowel habits Skin: ( - ) abnormal skin rashes Lymphatics: ( - ) new lymphadenopathy, ( - ) easy bruising Neurological: ( - ) numbness, ( - ) tingling, ( - ) new weaknesses Behavioral/Psych: ( + ) mood change, ( - ) new changes  All  other systems were reviewed with the patient and are negative.  I have reviewed the past medical history, past surgical history, social history and family history with the patient and they are unchanged from previous note.  ALLERGIES:  has No Known Allergies.  MEDICATIONS:  Current Outpatient Medications  Medication Sig Dispense Refill  . amoxicillin (AMOXIL) 500 MG capsule Take 500 mg by mouth 2 (two) times daily.    Marland Kitchen aspirin EC 81 MG tablet Take 1 tablet (81 mg total) by mouth daily. 90 tablet 3  . atorvastatin (LIPITOR) 20 MG tablet TAKE 1 TABLET BY MOUTH DAILY (Patient taking differently: Take 20 mg by mouth daily. ) 90 tablet 2  . benzonatate (TESSALON) 100 MG capsule Take 1 capsule (100 mg total) by mouth 3 (three) times daily as needed for up to 30 days for cough. 60 capsule 3  . Cholecalciferol (VITAMIN D PO) Take 1 capsule by mouth at bedtime.    Marland Kitchen dexamethasone (DECADRON) 4 MG tablet Take 2 tablets by mouth once a day on the day after chemotherapy and then take 2 tablets two times a day for 2 days. Take with food. 30 tablet 1  . diclofenac (VOLTAREN) 75 MG EC tablet Take 75 mg by mouth daily.    Marland Kitchen diltiazem (CARDIZEM CD) 240 MG 24 hr capsule Take 1 capsule (240 mg total) by mouth daily. 90 capsule 1  . DULoxetine (CYMBALTA) 30 MG capsule Take 1 capsule (30 mg total) by mouth daily for 7 days, THEN 2 capsules (60 mg total) daily for 30 days. 67 capsule 5  . HYDROcodone-acetaminophen (NORCO) 7.5-325 MG tablet Take 1 tablet by mouth every 6 (six) hours as needed for moderate pain. 12 tablet 0  . ibuprofen (ADVIL,MOTRIN) 200 MG tablet Take 400 mg by mouth daily as needed for moderate pain.    Marland Kitchen LORazepam (ATIVAN) 0.5 MG tablet Take 1 tablet (0.5 mg total) by mouth every 6 (six) hours as needed (Nausea or vomiting). 30 tablet 0  . metoCLOPramide (REGLAN) 10 MG tablet Take 1 tablet (10 mg total) by mouth 3 (three) times daily before meals for 14 days. 42 tablet 0  . metoprolol succinate  (TOPROL XL) 100 MG 24 hr tablet Take 1 tablet (100 mg total) by mouth 2 (two) times daily. Take with or immediately following a meal. 180 tablet 3  . nitroGLYCERIN (NITROSTAT) 0.3 MG SL tablet Place 1 tablet (0.3 mg total) under the tongue every 5 (five) minutes as needed for chest pain. (Patient not taking: Reported on 02/17/2018) 30 tablet 0  . potassium chloride SA (K-DUR,KLOR-CON) 20 MEQ tablet Take 1 tablet (20 mEq total) by mouth 2 (two) times daily. 180 tablet 0  . prochlorperazine (COMPAZINE) 10 MG tablet Take 1 tablet (10 mg total) by mouth every 6 (six) hours as needed (Nausea or vomiting). 30 tablet 1  . ranitidine (ZANTAC) 150 MG tablet Take 150 mg by mouth daily as needed for heartburn.    Marland Kitchen  vitamin B-12 (CYANOCOBALAMIN) 100 MCG tablet Take 100 mcg by mouth daily.     No current facility-administered medications for this visit.     PHYSICAL EXAMINATION: ECOG PERFORMANCE STATUS: 1 - Symptomatic but completely ambulatory  Today's Vitals   03/03/18 0954  BP: 133/82  Pulse: 65  Resp: 18  Temp: 98 F (36.7 C)  TempSrc: Oral  SpO2: 98%  Weight: 233 lb 12 oz (106 kg)  PainSc: 0-No pain   Body mass index is 32.6 kg/m.  Filed Weights   03/03/18 0954  Weight: 233 lb 12 oz (106 kg)    GENERAL: alert, no distress and comfortable, fatigued  SKIN: skin color, texture, turgor are normal, no rashes or significant lesions EYES: conjunctiva are pink and non-injected, sclera clear OROPHARYNX: no exudate, no erythema; lips, buccal mucosa, and tongue normal  NECK: supple, non-tender LYMPH:  no palpable lymphadenopathy in the cervical LUNGS: clear to auscultation and percussion with normal breathing effort HEART: regular rate & rhythm and no murmurs and no lower extremity edema ABDOMEN: soft, non-tender, non-distended, normal bowel sounds Musculoskeletal: no cyanosis of digits and no clubbing  PSYCH: alert & oriented x 3, fluent speech NEURO: no focal motor/sensory  deficits  LABORATORY DATA:  I have reviewed the data as listed    Component Value Date/Time   NA 131 (L) 03/03/2018 0945   NA 141 08/29/2017 1155   K 3.7 03/03/2018 0945   CL 96 (L) 03/03/2018 0945   CO2 25 03/03/2018 0945   GLUCOSE 148 (H) 03/03/2018 0945   BUN 16 03/03/2018 0945   BUN 16 08/29/2017 1155   CREATININE 0.83 03/03/2018 0945   CREATININE 0.83 09/27/2014 0001   CALCIUM 9.3 03/03/2018 0945   PROT 6.4 (L) 03/03/2018 0945   PROT 7.4 08/29/2017 1155   ALBUMIN 3.7 03/03/2018 0945   ALBUMIN 4.1 08/29/2017 1155   AST 15 03/03/2018 0945   ALT 30 03/03/2018 0945   ALKPHOS 113 03/03/2018 0945   BILITOT 0.8 03/03/2018 0945   GFRNONAA >60 03/03/2018 0945   GFRAA >60 03/03/2018 0945    No results found for: SPEP, UPEP  Lab Results  Component Value Date   WBC 2.8 (L) 03/03/2018   NEUTROABS 0.9 (L) 03/03/2018   HGB 10.0 (L) 03/03/2018   HCT 30.6 (L) 03/03/2018   MCV 85.5 03/03/2018   PLT 290 03/03/2018      Chemistry      Component Value Date/Time   NA 131 (L) 03/03/2018 0945   NA 141 08/29/2017 1155   K 3.7 03/03/2018 0945   CL 96 (L) 03/03/2018 0945   CO2 25 03/03/2018 0945   BUN 16 03/03/2018 0945   BUN 16 08/29/2017 1155   CREATININE 0.83 03/03/2018 0945   CREATININE 0.83 09/27/2014 0001      Component Value Date/Time   CALCIUM 9.3 03/03/2018 0945   ALKPHOS 113 03/03/2018 0945   AST 15 03/03/2018 0945   ALT 30 03/03/2018 0945   BILITOT 0.8 03/03/2018 0945

## 2018-03-04 ENCOUNTER — Inpatient Hospital Stay: Payer: Medicare Other

## 2018-03-04 ENCOUNTER — Other Ambulatory Visit: Payer: Self-pay

## 2018-03-04 VITALS — BP 150/76 | HR 64 | Temp 97.8°F | Resp 18

## 2018-03-04 DIAGNOSIS — D6481 Anemia due to antineoplastic chemotherapy: Secondary | ICD-10-CM | POA: Diagnosis not present

## 2018-03-04 DIAGNOSIS — T451X5A Adverse effect of antineoplastic and immunosuppressive drugs, initial encounter: Secondary | ICD-10-CM | POA: Diagnosis not present

## 2018-03-04 DIAGNOSIS — Z7689 Persons encountering health services in other specified circumstances: Secondary | ICD-10-CM | POA: Diagnosis not present

## 2018-03-04 DIAGNOSIS — C8190 Hodgkin lymphoma, unspecified, unspecified site: Secondary | ICD-10-CM | POA: Diagnosis not present

## 2018-03-04 DIAGNOSIS — D701 Agranulocytosis secondary to cancer chemotherapy: Secondary | ICD-10-CM | POA: Diagnosis not present

## 2018-03-04 DIAGNOSIS — Z5111 Encounter for antineoplastic chemotherapy: Secondary | ICD-10-CM | POA: Diagnosis not present

## 2018-03-04 MED ORDER — SODIUM CHLORIDE 0.9% FLUSH
10.0000 mL | INTRAVENOUS | Status: DC | PRN
  Administered 2018-03-04: 10 mL
  Filled 2018-03-04: qty 10

## 2018-03-04 MED ORDER — PALONOSETRON HCL INJECTION 0.25 MG/5ML
0.2500 mg | Freq: Once | INTRAVENOUS | Status: AC
  Administered 2018-03-04: 0.25 mg via INTRAVENOUS

## 2018-03-04 MED ORDER — SODIUM CHLORIDE 0.9 % IV SOLN
Freq: Once | INTRAVENOUS | Status: AC
  Administered 2018-03-04: 09:00:00 via INTRAVENOUS
  Filled 2018-03-04: qty 250

## 2018-03-04 MED ORDER — HEPARIN SOD (PORK) LOCK FLUSH 100 UNIT/ML IV SOLN
500.0000 [IU] | Freq: Once | INTRAVENOUS | Status: AC | PRN
  Administered 2018-03-04: 500 [IU]
  Filled 2018-03-04: qty 5

## 2018-03-04 MED ORDER — SODIUM CHLORIDE 0.9 % IV SOLN
Freq: Once | INTRAVENOUS | Status: AC
  Filled 2018-03-04: qty 250

## 2018-03-04 MED ORDER — VINBLASTINE SULFATE CHEMO INJECTION 1 MG/ML
15.0000 mg | Freq: Once | INTRAVENOUS | Status: AC
  Administered 2018-03-04: 15 mg via INTRAVENOUS
  Filled 2018-03-04: qty 15

## 2018-03-04 MED ORDER — PALONOSETRON HCL INJECTION 0.25 MG/5ML
INTRAVENOUS | Status: AC
  Filled 2018-03-04: qty 5

## 2018-03-04 MED ORDER — SODIUM CHLORIDE 0.9 % IV SOLN
Freq: Once | INTRAVENOUS | Status: AC
  Administered 2018-03-04: 10:00:00 via INTRAVENOUS
  Filled 2018-03-04: qty 5

## 2018-03-04 MED ORDER — SODIUM CHLORIDE 0.9 % IV SOLN
900.0000 mg | Freq: Once | INTRAVENOUS | Status: AC
  Administered 2018-03-04: 900 mg via INTRAVENOUS
  Filled 2018-03-04: qty 90

## 2018-03-04 MED ORDER — DOXORUBICIN HCL CHEMO IV INJECTION 2 MG/ML
25.0000 mg/m2 | Freq: Once | INTRAVENOUS | Status: AC
  Administered 2018-03-04: 60 mg via INTRAVENOUS
  Filled 2018-03-04: qty 30

## 2018-03-04 NOTE — Patient Instructions (Addendum)
[facility] Discharge Instructions for Patients Receiving Chemotherapy  Today you received the following chemotherapy agents Adriamycin, Velban, DTIC  To help prevent nausea and vomiting after your treatment, we encourage you to take your nausea medication    If you develop nausea and vomiting that is not controlled by your nausea medication, call the clinic.   BELOW ARE SYMPTOMS THAT SHOULD BE REPORTED IMMEDIATELY:  *FEVER GREATER THAN 100.5 F  *CHILLS WITH OR WITHOUT FEVER  NAUSEA AND VOMITING THAT IS NOT CONTROLLED WITH YOUR NAUSEA MEDICATION  *UNUSUAL SHORTNESS OF BREATH  *UNUSUAL BRUISING OR BLEEDING  TENDERNESS IN MOUTH AND THROAT WITH OR WITHOUT PRESENCE OF ULCERS  *URINARY PROBLEMS  *BOWEL PROBLEMS  UNUSUAL RASH Items with * indicate a potential emergency and should be followed up as soon as possible.  Feel free to call the clinic should you have any questions or concerns. The clinic phone number is [phone].  Please show the [facility] at check-in to the Emergency Department and triage nurse.  Vinblastine injection What is this medicine? VINBLASTINE (vin BLAS teen) is a chemotherapy drug. It slows the growth of cancer cells. This medicine is used to treat many types of cancer like breast cancer, testicular cancer, Hodgkin's disease, non-Hodgkin's lymphoma, and sarcoma. This medicine may be used for other purposes; ask your health care provider or pharmacist if you have questions. COMMON BRAND NAME(S): Velban What should I tell my health care provider before I take this medicine? They need to know if you have any of these conditions: -blood disorders -dental disease -gout -infection (especially a virus infection such as chickenpox, cold sores, or herpes) -liver disease -lung disease -nervous system disease -recent or ongoing radiation therapy -an unusual or allergic reaction to vinblastine, other chemotherapy agents, other medicines,  foods, dyes, or preservatives -pregnant or trying to get pregnant -breast-feeding How should I use this medicine? This drug is given as an infusion into a vein. It is administered in a hospital or clinic by a specially trained health care professional. If you have pain, swelling, burning or any unusual feeling around the site of your injection, tell your health care professional right away. Talk to your pediatrician regarding the use of this medicine in children. While this drug may be prescribed for selected conditions, precautions do apply. Overdosage: If you think you have taken too much of this medicine contact a poison control center or emergency room at once. NOTE: This medicine is only for you. Do not share this medicine with others. What if I miss a dose? It is important not to miss your dose. Call your doctor or health care professional if you are unable to keep an appointment. What may interact with this medicine? Do not take this medicine with any of the following medications: -erythromycin -itraconazole -mibefradil -voriconazole This medicine may also interact with the following medications: -cyclosporine -fluconazole -ketoconazole -medicines for seizures like phenytoin -medicines to increase blood counts like filgrastim, pegfilgrastim, sargramostim -vaccines -verapamil Talk to your doctor or health care professional before taking any of these medicines: -acetaminophen -aspirin -ibuprofen -ketoprofen -naproxen This list may not describe all possible interactions. Give your health care provider a list of all the medicines, herbs, non-prescription drugs, or dietary supplements you use. Also tell them if you smoke, drink alcohol, or use illegal drugs. Some items may interact with your medicine. What should I watch for while using this medicine? Your condition will be monitored carefully while you are receiving this medicine. You will  need important blood work done while you  are taking this medicine. This drug may make you feel generally unwell. This is not uncommon, as chemotherapy can affect healthy cells as well as cancer cells. Report any side effects. Continue your course of treatment even though you feel ill unless your doctor tells you to stop. In some cases, you may be given additional medicines to help with side effects. Follow all directions for their use. Call your doctor or health care professional for advice if you get a fever, chills or sore throat, or other symptoms of a cold or flu. Do not treat yourself. This drug decreases your body's ability to fight infections. Try to avoid being around people who are sick. This medicine may increase your risk to bruise or bleed. Call your doctor or health care professional if you notice any unusual bleeding. Be careful brushing and flossing your teeth or using a toothpick because you may get an infection or bleed more easily. If you have any dental work done, tell your dentist you are receiving this medicine. Avoid taking products that contain aspirin, acetaminophen, ibuprofen, naproxen, or ketoprofen unless instructed by your doctor. These medicines may hide a fever. Do not become pregnant while taking this medicine. Women should inform their doctor if they wish to become pregnant or think they might be pregnant. There is a potential for serious side effects to an unborn child. Talk to your health care professional or pharmacist for more information. Do not breast-feed an infant while taking this medicine. Men may have a lower sperm count while taking this medicine. Talk to your doctor if you plan to father a child. What side effects may I notice from receiving this medicine? Side effects that you should report to your doctor or health care professional as soon as possible: -allergic reactions like skin rash, itching or hives, swelling of the face, lips, or tongue -low blood counts - This drug may decrease the number of  white blood cells, red blood cells and platelets. You may be at increased risk for infections and bleeding. -signs of infection - fever or chills, cough, sore throat, pain or difficulty passing urine -signs of decreased platelets or bleeding - bruising, pinpoint red spots on the skin, black, tarry stools, nosebleeds -signs of decreased red blood cells - unusually weak or tired, fainting spells, lightheadedness -breathing problems -changes in hearing -change in the amount of urine -chest pain -high blood pressure -mouth sores -nausea and vomiting -pain, swelling, redness or irritation at the injection site -pain, tingling, numbness in the hands or feet -problems with balance, dizziness -seizures Side effects that usually do not require medical attention (report to your doctor or health care professional if they continue or are bothersome): -constipation -hair loss -jaw pain -loss of appetite -sensitivity to light -stomach pain -tumor pain This list may not describe all possible side effects. Call your doctor for medical advice about side effects. You may report side effects to FDA at 1-800-FDA-1088. Where should I keep my medicine? This drug is given in a hospital or clinic and will not be stored at home. NOTE: This sheet is a summary. It may not cover all possible information. If you have questions about this medicine, talk to your doctor, pharmacist, or health care provider.  2019 Elsevier/Gold Standard (2007-09-29 17:15:59) Doxorubicin injection What is this medicine? DOXORUBICIN (dox oh ROO bi sin) is a chemotherapy drug. It is used to treat many kinds of cancer like leukemia, lymphoma, neuroblastoma, sarcoma, and Wilms'  tumor. It is also used to treat bladder cancer, breast cancer, lung cancer, ovarian cancer, stomach cancer, and thyroid cancer. This medicine may be used for other purposes; ask your health care provider or pharmacist if you have questions. COMMON BRAND NAME(S):  Adriamycin, Adriamycin PFS, Adriamycin RDF, Rubex What should I tell my health care provider before I take this medicine? They need to know if you have any of these conditions: -heart disease -history of low blood counts caused by a medicine -liver disease -recent or ongoing radiation therapy -an unusual or allergic reaction to doxorubicin, other chemotherapy agents, other medicines, foods, dyes, or preservatives -pregnant or trying to get pregnant -breast-feeding How should I use this medicine? This drug is given as an infusion into a vein. It is administered in a hospital or clinic by a specially trained health care professional. If you have pain, swelling, burning or any unusual feeling around the site of your injection, tell your health care professional right away. Talk to your pediatrician regarding the use of this medicine in children. Special care may be needed. Overdosage: If you think you have taken too much of this medicine contact a poison control center or emergency room at once. NOTE: This medicine is only for you. Do not share this medicine with others. What if I miss a dose? It is important not to miss your dose. Call your doctor or health care professional if you are unable to keep an appointment. What may interact with this medicine? This medicine may interact with the following medications: -6-mercaptopurine -paclitaxel -phenytoin -St. John's Wort -trastuzumab -verapamil This list may not describe all possible interactions. Give your health care provider a list of all the medicines, herbs, non-prescription drugs, or dietary supplements you use. Also tell them if you smoke, drink alcohol, or use illegal drugs. Some items may interact with your medicine. What should I watch for while using this medicine? This drug may make you feel generally unwell. This is not uncommon, as chemotherapy can affect healthy cells as well as cancer cells. Report any side effects. Continue  your course of treatment even though you feel ill unless your doctor tells you to stop. There is a maximum amount of this medicine you should receive throughout your life. The amount depends on the medical condition being treated and your overall health. Your doctor will watch how much of this medicine you receive in your lifetime. Tell your doctor if you have taken this medicine before. You may need blood work done while you are taking this medicine. Your urine may turn red for a few days after your dose. This is not blood. If your urine is dark or brown, call your doctor. In some cases, you may be given additional medicines to help with side effects. Follow all directions for their use. Call your doctor or health care professional for advice if you get a fever, chills or sore throat, or other symptoms of a cold or flu. Do not treat yourself. This drug decreases your body's ability to fight infections. Try to avoid being around people who are sick. This medicine may increase your risk to bruise or bleed. Call your doctor or health care professional if you notice any unusual bleeding. Talk to your doctor about your risk of cancer. You may be more at risk for certain types of cancers if you take this medicine. Do not become pregnant while taking this medicine or for 6 months after stopping it. Women should inform their doctor if they wish  to become pregnant or think they might be pregnant. Men should not father a child while taking this medicine and for 6 months after stopping it. There is a potential for serious side effects to an unborn child. Talk to your health care professional or pharmacist for more information. Do not breast-feed an infant while taking this medicine. This medicine has caused ovarian failure in some women and reduced sperm counts in some men This medicine may interfere with the ability to have a child. Talk with your doctor or health care professional if you are concerned about your  fertility. This medicine may cause a decrease in Co-Enzyme Q-10. You should make sure that you get enough Co-Enzyme Q-10 while you are taking this medicine. Discuss the foods you eat and the vitamins you take with your health care professional. What side effects may I notice from receiving this medicine? Side effects that you should report to your doctor or health care professional as soon as possible: -allergic reactions like skin rash, itching or hives, swelling of the face, lips, or tongue -breathing problems -chest pain -fast or irregular heartbeat -low blood counts - this medicine may decrease the number of white blood cells, red blood cells and platelets. You may be at increased risk for infections and bleeding. -pain, redness, or irritation at site where injected -signs of infection - fever or chills, cough, sore throat, pain or difficulty passing urine -signs of decreased platelets or bleeding - bruising, pinpoint red spots on the skin, black, tarry stools, blood in the urine -swelling of the ankles, feet, hands -tiredness -weakness Side effects that usually do not require medical attention (report to your doctor or health care professional if they continue or are bothersome): -diarrhea -hair loss -mouth sores -nail discoloration or damage -nausea -red colored urine -vomiting This list may not describe all possible side effects. Call your doctor for medical advice about side effects. You may report side effects to FDA at 1-800-FDA-1088. Where should I keep my medicine? This drug is given in a hospital or clinic and will not be stored at home. NOTE: This sheet is a summary. It may not cover all possible information. If you have questions about this medicine, talk to your doctor, pharmacist, or health care provider.  2019 Elsevier/Gold Standard (2016-08-15 11:01:26) Dacarbazine, DTIC injection What is this medicine? DACARBAZINE (da KAR ba zeen) is a chemotherapy drug. This  medicine is used to treat skin cancer. It is also used with other medicines to treat Hodgkin's disease. This medicine may be used for other purposes; ask your health care provider or pharmacist if you have questions. COMMON BRAND NAME(S): DTIC-Dome What should I tell my health care provider before I take this medicine? They need to know if you have any of these conditions: -infection (especially virus infection such as chickenpox, cold sores, or herpes) -kidney disease -liver disease -low blood counts like low platelets, red blood cells, white blood cells -recent radiation therapy -an unusual or allergic reaction to dacarbazine, other chemotherapy agents, other medicines, foods, dyes, or preservatives -pregnant or trying to get pregnant -breast-feeding How should I use this medicine? This drug is given as an injection or infusion into a vein. It is administered in a hospital or clinic by a specially trained health care professional. Talk to your pediatrician regarding the use of this medicine in children. While this drug may be prescribed for selected conditions, precautions do apply. Overdosage: If you think you have taken too much of this medicine contact a  poison control center or emergency room at once. NOTE: This medicine is only for you. Do not share this medicine with others. What if I miss a dose? It is important not to miss your dose. Call your doctor or health care professional if you are unable to keep an appointment. What may interact with this medicine? -medicines to increase blood counts like filgrastim, pegfilgrastim, sargramostim -vaccines This list may not describe all possible interactions. Give your health care provider a list of all the medicines, herbs, non-prescription drugs, or dietary supplements you use. Also tell them if you smoke, drink alcohol, or use illegal drugs. Some items may interact with your medicine. What should I watch for while using this medicine? Your  condition will be monitored carefully while you are receiving this medicine. You will need important blood work done while you are taking this medicine. This drug may make you feel generally unwell. This is not uncommon, as chemotherapy can affect healthy cells as well as cancer cells. Report any side effects. Continue your course of treatment even though you feel ill unless your doctor tells you to stop. Call your doctor or health care professional for advice if you get a fever, chills or sore throat, or other symptoms of a cold or flu. Do not treat yourself. This drug decreases your body's ability to fight infections. Try to avoid being around people who are sick. This medicine may increase your risk to bruise or bleed. Call your doctor or health care professional if you notice any unusual bleeding. Talk to your doctor about your risk of cancer. You may be more at risk for certain types of cancers if you take this medicine. Do not become pregnant while taking this medicine. Women should inform their doctor if they wish to become pregnant or think they might be pregnant. There is a potential for serious side effects to an unborn child. Talk to your health care professional or pharmacist for more information. Do not breast-feed an infant while taking this medicine. What side effects may I notice from receiving this medicine? Side effects that you should report to your doctor or health care professional as soon as possible: -allergic reactions like skin rash, itching or hives, swelling of the face, lips, or tongue -low blood counts - this medicine may decrease the number of white blood cells, red blood cells and platelets. You may be at increased risk for infections and bleeding. -signs of infection - fever or chills, cough, sore throat, pain or difficulty passing urine -signs of decreased platelets or bleeding - bruising, pinpoint red spots on the skin, black, tarry stools, blood in the urine -signs of  decreased red blood cells - unusually weak or tired, fainting spells, lightheadedness -breathing problems -muscle pains -pain at site where injected -trouble passing urine or change in the amount of urine -vomiting -yellowing of the eyes or skin Side effects that usually do not require medical attention (report to your doctor or health care professional if they continue or are bothersome): -diarrhea -hair loss -loss of appetite -nausea -skin more sensitive to sun or ultraviolet light -stomach upset This list may not describe all possible side effects. Call your doctor for medical advice about side effects. You may report side effects to FDA at 1-800-FDA-1088. Where should I keep my medicine? This drug is given in a hospital or clinic and will not be stored at home. NOTE: This sheet is a summary. It may not cover all possible information. If you have questions about  this medicine, talk to your doctor, pharmacist, or health care provider.  2019 [employer]vier/Gold Standard (2015-03-04 15:17:39)

## 2018-03-10 ENCOUNTER — Encounter: Payer: Self-pay | Admitting: Hematology

## 2018-03-10 ENCOUNTER — Inpatient Hospital Stay: Payer: Medicare Other

## 2018-03-10 ENCOUNTER — Ambulatory Visit (INDEPENDENT_AMBULATORY_CARE_PROVIDER_SITE_OTHER): Payer: Medicare Other | Admitting: Cardiology

## 2018-03-10 ENCOUNTER — Inpatient Hospital Stay (HOSPITAL_BASED_OUTPATIENT_CLINIC_OR_DEPARTMENT_OTHER): Payer: Medicare Other | Admitting: Hematology

## 2018-03-10 ENCOUNTER — Telehealth: Payer: Self-pay | Admitting: Hematology

## 2018-03-10 ENCOUNTER — Encounter: Payer: Self-pay | Admitting: Cardiology

## 2018-03-10 ENCOUNTER — Other Ambulatory Visit: Payer: Self-pay

## 2018-03-10 VITALS — BP 124/64 | HR 65 | Ht 71.0 in | Wt 240.0 lb

## 2018-03-10 VITALS — BP 139/77 | HR 64 | Temp 97.7°F | Wt 240.0 lb

## 2018-03-10 DIAGNOSIS — D701 Agranulocytosis secondary to cancer chemotherapy: Secondary | ICD-10-CM | POA: Diagnosis not present

## 2018-03-10 DIAGNOSIS — E871 Hypo-osmolality and hyponatremia: Secondary | ICD-10-CM

## 2018-03-10 DIAGNOSIS — I471 Supraventricular tachycardia: Secondary | ICD-10-CM

## 2018-03-10 DIAGNOSIS — T451X5A Adverse effect of antineoplastic and immunosuppressive drugs, initial encounter: Secondary | ICD-10-CM | POA: Diagnosis not present

## 2018-03-10 DIAGNOSIS — D6481 Anemia due to antineoplastic chemotherapy: Secondary | ICD-10-CM | POA: Diagnosis not present

## 2018-03-10 DIAGNOSIS — C8190 Hodgkin lymphoma, unspecified, unspecified site: Secondary | ICD-10-CM | POA: Diagnosis not present

## 2018-03-10 DIAGNOSIS — R110 Nausea: Secondary | ICD-10-CM | POA: Diagnosis not present

## 2018-03-10 DIAGNOSIS — E46 Unspecified protein-calorie malnutrition: Secondary | ICD-10-CM | POA: Diagnosis not present

## 2018-03-10 DIAGNOSIS — Z7689 Persons encountering health services in other specified circumstances: Secondary | ICD-10-CM | POA: Diagnosis not present

## 2018-03-10 DIAGNOSIS — Z5111 Encounter for antineoplastic chemotherapy: Secondary | ICD-10-CM | POA: Diagnosis not present

## 2018-03-10 LAB — CBC WITH DIFFERENTIAL (CANCER CENTER ONLY)
Abs Immature Granulocytes: 0.03 10*3/uL (ref 0.00–0.07)
Basophils Absolute: 0 10*3/uL (ref 0.0–0.1)
Basophils Relative: 1 %
Eosinophils Absolute: 0 10*3/uL (ref 0.0–0.5)
Eosinophils Relative: 1 %
HCT: 30.9 % — ABNORMAL LOW (ref 39.0–52.0)
Hemoglobin: 10.1 g/dL — ABNORMAL LOW (ref 13.0–17.0)
Immature Granulocytes: 2 %
LYMPHS ABS: 0.6 10*3/uL — AB (ref 0.7–4.0)
Lymphocytes Relative: 36 %
MCH: 27.4 pg (ref 26.0–34.0)
MCHC: 32.7 g/dL (ref 30.0–36.0)
MCV: 84 fL (ref 80.0–100.0)
Monocytes Absolute: 0 10*3/uL — ABNORMAL LOW (ref 0.1–1.0)
Monocytes Relative: 1 %
Neutro Abs: 0.9 10*3/uL — ABNORMAL LOW (ref 1.7–7.7)
Neutrophils Relative %: 59 %
Platelet Count: 198 10*3/uL (ref 150–400)
RBC: 3.68 MIL/uL — ABNORMAL LOW (ref 4.22–5.81)
RDW: 15.6 % — ABNORMAL HIGH (ref 11.5–15.5)
WBC Count: 1.5 10*3/uL — ABNORMAL LOW (ref 4.0–10.5)
nRBC: 0 % (ref 0.0–0.2)

## 2018-03-10 LAB — CMP (CANCER CENTER ONLY)
ALT: 25 U/L (ref 0–44)
AST: 11 U/L — ABNORMAL LOW (ref 15–41)
Albumin: 3.5 g/dL (ref 3.5–5.0)
Alkaline Phosphatase: 64 U/L (ref 38–126)
Anion gap: 9 (ref 5–15)
BUN: 21 mg/dL (ref 8–23)
CO2: 29 mmol/L (ref 22–32)
Calcium: 8.9 mg/dL (ref 8.9–10.3)
Chloride: 94 mmol/L — ABNORMAL LOW (ref 98–111)
Creatinine: 0.74 mg/dL (ref 0.61–1.24)
GFR, Est AFR Am: >60 mL/min (ref >60)
GFR, Estimated: >60 mL/min (ref >60)
Glucose, Bld: 144 mg/dL — ABNORMAL HIGH (ref 70–99)
POTASSIUM: 3.7 mmol/L (ref 3.5–5.1)
SODIUM: 132 mmol/L — AB (ref 135–145)
Total Bilirubin: 0.8 mg/dL (ref 0.3–1.2)
Total Protein: 5.4 g/dL — ABNORMAL LOW (ref 6.5–8.1)

## 2018-03-10 LAB — MAGNESIUM: Magnesium: 1.8 mg/dL (ref 1.7–2.4)

## 2018-03-10 MED ORDER — SODIUM CHLORIDE 0.9% FLUSH
10.0000 mL | INTRAVENOUS | Status: DC | PRN
  Administered 2018-03-10: 10 mL via INTRAVENOUS
  Filled 2018-03-10: qty 10

## 2018-03-10 MED ORDER — HEPARIN SOD (PORK) LOCK FLUSH 100 UNIT/ML IV SOLN
500.0000 [IU] | Freq: Once | INTRAVENOUS | Status: AC
  Administered 2018-03-10: 500 [IU] via INTRAVENOUS
  Filled 2018-03-10: qty 5

## 2018-03-10 MED ORDER — SODIUM CHLORIDE 0.9 % IV SOLN
Freq: Once | INTRAVENOUS | Status: AC
  Administered 2018-03-10: 10:00:00 via INTRAVENOUS
  Filled 2018-03-10: qty 250

## 2018-03-10 MED ORDER — DILTIAZEM HCL ER COATED BEADS 120 MG PO CP24: 120.0000 mg | ORAL_CAPSULE | Freq: Every day | ORAL | 1 refills | Status: DC

## 2018-03-10 NOTE — Progress Notes (Signed)
[facility] OFFICE PROGRESS NOTE  Patient Care Team: Tysinger, Camelia Eng, PA-C as PCP - General (Family Medicine) Nahser, Wonda Cheng, MD as PCP - Cardiology (Cardiology) Constance Haw, MD as PCP - Electrophysiology (Cardiology)  HEME/ONC OVERVIEW: 1. Stage III classic Hodgkin lymphoma, subtype NOS due to limited specimen -Bx-proven R cervical LN involvement in 09/2017; PET in 11/2017 showed cervical, mediastinal and upper abdominal LN involement; treatment delayed due to pt traveling on a cruise after diagnosis -11/2017 - present: ABVD -12/2017: PET after 2 cycles of ABVD showed interval CR  -Chemotherapy delayed due to SVT requiring ablation in 01/2018 -Mid-01/2018 - present: AVD   2. Port placement in 11/2017   CHEMOTHERAPY REGIMEN:  11/26/2017 - 01/07/2018: ABVD x 2 cycles, CR  02/04/2018 - present: AVD, plan for 4 cycles  ASSESSMENT & PLAN:   Stage III classic Hodgkin lymphoma, favorable risk  -Patient received Cycle 4 Day 1 of AVD on 03/04/2018; C4D15 treatment due on 03/17/2018  -Due to his recent history of poor PO intake following each chemotherapy treatment, we have been administering 1LS NS M-F during the 2nd week of each chemotherapy treatment  -Plan for a total of 4 cycles of AVD so long as the patient is able to tolerate treatment -PRN antiemetics: Zofran, Compazine, dexamethasone, and Ativan  Chemotherapy-associated neutropenia -Secondary to AVD -WBC 1.5k with ANC 900 -Patient denies any symptoms of infection -In absence of infection, neutropenia alone would not delay treatment for Hodgkin lymphoma -We will monitor for now; no dose adjustment needed  Chemotherapy-associated anemia -Secondary to AVD -Hgb 10.1 today -Patient denies any symptoms of bleeding -We will monitor for now; no dose adjustment needed  Chemotherapy-associated nausea -Relatively well controlled with PRN Compazine and Ativan -Patient stopped taking Zofran due to increased  sensation of palpitation -Continue PRN anti-emetics   Hyponatremia -Na 132 today, stable -Patient denies any symptoms of hyponatremia -I counseled the patient on increase in his Na intake and continuing IV fluid as planned   Malnutrition -Patient has lost at least 10-15 lbs since starting treatment, but over the past week, he has gained 3-4 lbs  -I encouraged patient to maintain adequate p.o. intake as tolerated and continue nutritional supplements (goal 4 to 6 cans/day when not taking in adequate p.o.) -IV fluids as outlined above  Depression -Patient was started on Cymbalta last week due to depressed mood -He denies any SI/HI; mood improving -Continue Cymbalta 60 mg daily -If symptoms of depression worsen, patient understand that he should call promptly and we can consider referral to psychiatry  History of SVT -Status post ablation in the mid 01/2018 -Patient currently denies any symptoms recurrent palpitation, chest pain, or syncope -Currently on diltiazem ER 240 mg daily, diltiazem IR 30 mg nightly PRN, and metoprolol -Continue follow-up with cardiology  No orders of the defined types were placed in this encounter.  All questions were answered. The patient knows to call the clinic with any problems, questions or concerns. No barriers to learning was detected.  Return in 1 week for labs and clinic follow-up prior to C4D15 of AVD.   [name], MD 03/10/2018 10:01 AM  CHIEF COMPLAINT: "I am doing okay"  INTERVAL HISTORY: Bradley Novak returns to clinic for toxicity check after C4D1 of AVD.  He reports that he has moderate fatigue and occasional nausea, but it is generally well controlled with Compazine.  He has stopped taking Zofran due to subjective increased palpitation.  He has been pushing himself to eat and  drink more, has gained about 34 pounds over the past week.  He denies any symptoms of infection, such as fever, chill, rhinorrhea, cough, or sputum production.  He feels that  his mood has improved significantly since starting Cymbalta.  He denies any other complaint today.  SUMMARY OF ONCOLOGIC HISTORY:   Hodgkin lymphoma ([place])   08/29/2017 Initial Diagnosis    Hodgkin lymphoma ([place])    10/07/2017 Imaging    CT neck w/ contrast: Enlarged nodes in the right jugular chain, posterior triangle, and supraclavicular fossa. The pattern is most concerning for lymphoma; multiple nodes are superficial and amenable to biopsy.  CT CAP w/ contrast: 1. Right supraclavicular and subcarinal adenopathy is identified. Differential considerations include lymphoproliferative disorder, metastatic adenopathy, granulomatous inflammation/infection or reactive adenopathy. Consider further evaluation with tissue sampling. 2. No acute findings, mass or adenopathy within the abdomen. 3. Lungs are clear.  No evidence for pneumonia.  4.  Aortic Atherosclerosis (ICD10-I70.0).    10/25/2017 Procedure    US-guided R cervical LN biopsy    10/25/2017 Pathology Results    (Accession: [id number]) Lymph node, needle/core biopsy, Right Cervical - CLASSIC HODGKIN LYMPHOMA - SEE COMMENT - CD30 (1%) - Unable to subtype due to limited sample specimen    11/12/2017 Cancer Staging    Staging form: Hodgkin and Non-Hodgkin Lymphoma, AJCC 8th Edition - Clinical stage from 11/12/2017: Stage II (Hodgkin lymphoma, A - Asymptomatic) - Signed by [name], MD on 11/12/2017    11/21/2017 -  Chemotherapy    The patient had DOXOrubicin (ADRIAMYCIN) chemo injection 60 mg, 25 mg/m2 = 60 mg, Intravenous,  Once, 0 of 4 cycles palonosetron (ALOXI) injection 0.25 mg, 0.25 mg, Intravenous,  Once, 0 of 4 cycles bleomycin (BLEOCIN) 24 Units in sodium chloride 0.9 % 50 mL chemo infusion, 10 Units/m2 = 24 Units, Intravenous,  Once, 0 of 4 cycles dacarbazine (DTIC) 910 mg in sodium chloride 0.9 % 250 mL chemo infusion, 375 mg/m2 = 910 mg, Intravenous,  Once, 0 of 4 cycles vinBLAStine (VELBAN) 14.6 mg in sodium chloride  0.9 % 50 mL chemo infusion, 6 mg/m2 = 14.6 mg, Intravenous, Once, 0 of 4 cycles fosaprepitant (EMEND) 150 mg, dexamethasone (DECADRON) 12 mg in sodium chloride 0.9 % 145 mL IVPB, , Intravenous,  Once, 0 of 4 cycles  for chemotherapy treatment.     11/21/2017 Imaging    PET: IMPRESSION: 1. Multiple relatively small hypermetabolic lymph nodes in the RIGHT neck, mediastinum and limited involvement of the upper abdomen. Lymph node activity is high (greater than liver, [place] 4). 2. The most intense enlarged lymph node is anterior to the sternocleidomastoid muscle on the RIGHT. There are multiple assessable lymph nodes in the RIGHT cervical chain. 3. Minimal hypermetabolic adenopathy in the upper abdomen. No pelvic or inguinal metastatic adenopathy. 4. Normal spleen and bone marrow. 5. Single focus of metabolic activity in the sigmoid colon. Recommend screening colonoscopy if patient is not current for such.    01/10/2018 Imaging    PET (after 2 cycles of ABVD) IMPRESSION: 1. Interval complete response to therapy. No abnormal areas of persistent hypermetabolic adenopathy identified. 2.  Aortic Atherosclerosis (ICD10-I70.0). 3. Multi vessel coronary artery atherosclerotic calcifications.     REVIEW OF SYSTEMS:   Constitutional: ( - ) fevers, ( - )  chills , ( - ) night sweats Eyes: ( - ) blurriness of vision, ( - ) double vision, ( - ) watery eyes Ears, nose, mouth, throat, and face: ( - ) mucositis, ( - )  sore throat Respiratory: ( - ) cough, ( - ) dyspnea, ( - ) wheezes Cardiovascular: ( - ) palpitation, ( - ) chest discomfort, ( - ) lower extremity swelling Gastrointestinal:  ( - ) nausea, ( - ) heartburn, ( - ) change in bowel habits Skin: ( - ) abnormal skin rashes Lymphatics: ( - ) new lymphadenopathy, ( - ) easy bruising Neurological: ( - ) numbness, ( - ) tingling, ( - ) new weaknesses Behavioral/Psych: ( - ) mood change, ( - ) new changes  All other systems were reviewed with  the patient and are negative.  I have reviewed the past medical history, past surgical history, social history and family history with the patient and they are unchanged from previous note.  ALLERGIES:  has No Known Allergies.  MEDICATIONS:  Current Outpatient Medications  Medication Sig Dispense Refill  . amoxicillin (AMOXIL) 500 MG capsule Take 500 mg by mouth 2 (two) times daily.    Marland Kitchen aspirin EC 81 MG tablet Take 1 tablet (81 mg total) by mouth daily. 90 tablet 3  . atorvastatin (LIPITOR) 20 MG tablet TAKE 1 TABLET BY MOUTH DAILY (Patient taking differently: Take 20 mg by mouth daily. ) 90 tablet 2  . benzonatate (TESSALON) 100 MG capsule Take 1 capsule (100 mg total) by mouth 3 (three) times daily as needed for up to 30 days for cough. 60 capsule 3  . Cholecalciferol (VITAMIN D PO) Take 1 capsule by mouth at bedtime.    Marland Kitchen dexamethasone (DECADRON) 4 MG tablet Take 2 tablets by mouth once a day on the day after chemotherapy and then take 2 tablets two times a day for 2 days. Take with food. 30 tablet 1  . diclofenac (VOLTAREN) 75 MG EC tablet Take 75 mg by mouth daily.    Marland Kitchen diltiazem (CARDIZEM CD) 240 MG 24 hr capsule Take 1 capsule (240 mg total) by mouth daily. 90 capsule 1  . DULoxetine (CYMBALTA) 30 MG capsule Take 1 capsule (30 mg total) by mouth daily for 7 days, THEN 2 capsules (60 mg total) daily for 30 days. 67 capsule 5  . HYDROcodone-acetaminophen (NORCO) 7.5-325 MG tablet Take 1 tablet by mouth every 6 (six) hours as needed for moderate pain. 12 tablet 0  . ibuprofen (ADVIL,MOTRIN) 200 MG tablet Take 400 mg by mouth daily as needed for moderate pain.    Marland Kitchen LORazepam (ATIVAN) 0.5 MG tablet Take 1 tablet (0.5 mg total) by mouth every 6 (six) hours as needed (Nausea or vomiting). 30 tablet 0  . metoCLOPramide (REGLAN) 10 MG tablet Take 1 tablet (10 mg total) by mouth 3 (three) times daily before meals for 14 days. 42 tablet 0  . metoprolol succinate (TOPROL XL) 100 MG 24 hr tablet  Take 1 tablet (100 mg total) by mouth 2 (two) times daily. Take with or immediately following a meal. 180 tablet 3  . nitroGLYCERIN (NITROSTAT) 0.3 MG SL tablet Place 1 tablet (0.3 mg total) under the tongue every 5 (five) minutes as needed for chest pain. (Patient not taking: Reported on 02/17/2018) 30 tablet 0  . potassium chloride SA (K-DUR,KLOR-CON) 20 MEQ tablet Take 1 tablet (20 mEq total) by mouth 2 (two) times daily. 180 tablet 0  . prochlorperazine (COMPAZINE) 10 MG tablet Take 1 tablet (10 mg total) by mouth every 6 (six) hours as needed (Nausea or vomiting). 30 tablet 1  . ranitidine (ZANTAC) 150 MG tablet Take 150 mg by mouth daily as needed for heartburn.    Marland Kitchen  vitamin B-12 (CYANOCOBALAMIN) 100 MCG tablet Take 100 mcg by mouth daily.     No current facility-administered medications for this visit.     PHYSICAL EXAMINATION: ECOG PERFORMANCE STATUS: 1 - Symptomatic but completely ambulatory  Today's Vitals   03/10/18 0944  BP: 139/77  Pulse: 64  Temp: 97.7 F (36.5 C)  TempSrc: Oral  SpO2: 100%  Weight: 240 lb 0.6 oz (108.9 kg)  PainSc: 0-No pain   Body mass index is 33.48 kg/m.  Filed Weights   03/10/18 0944  Weight: 240 lb 0.6 oz (108.9 kg)    GENERAL: alert, no distress and comfortable SKIN: skin color, texture, turgor are normal, no rashes or significant lesions EYES: conjunctiva are pink and non-injected, sclera clear OROPHARYNX: no exudate, no erythema; lips, buccal mucosa, and tongue normal  NECK: supple, non-tender LYMPH:  no palpable lymphadenopathy in the cervical LUNGS: clear to auscultation with normal breathing effort HEART: regular rate & rhythm and no murmurs and no lower extremity edema ABDOMEN: soft, non-tender, non-distended, normal bowel sounds Musculoskeletal: no cyanosis of digits and no clubbing  PSYCH: alert & oriented x 3, fluent speech NEURO: no focal motor/sensory deficits  LABORATORY DATA:  I have reviewed the data as listed     Component Value Date/Time   NA 131 (L) 03/03/2018 0945   NA 141 08/29/2017 1155   K 3.7 03/03/2018 0945   CL 96 (L) 03/03/2018 0945   CO2 25 03/03/2018 0945   GLUCOSE 148 (H) 03/03/2018 0945   BUN 16 03/03/2018 0945   BUN 16 08/29/2017 1155   CREATININE 0.83 03/03/2018 0945   CREATININE 0.83 09/27/2014 0001   CALCIUM 9.3 03/03/2018 0945   PROT 6.4 (L) 03/03/2018 0945   PROT 7.4 08/29/2017 1155   ALBUMIN 3.7 03/03/2018 0945   ALBUMIN 4.1 08/29/2017 1155   AST 15 03/03/2018 0945   ALT 30 03/03/2018 0945   ALKPHOS 113 03/03/2018 0945   BILITOT 0.8 03/03/2018 0945   GFRNONAA >60 03/03/2018 0945   GFRAA >60 03/03/2018 0945    No results found for: SPEP, UPEP  Lab Results  Component Value Date   WBC 2.8 (L) 03/03/2018   NEUTROABS 0.9 (L) 03/03/2018   HGB 10.0 (L) 03/03/2018   HCT 30.6 (L) 03/03/2018   MCV 85.5 03/03/2018   PLT 290 03/03/2018      Chemistry      Component Value Date/Time   NA 131 (L) 03/03/2018 0945   NA 141 08/29/2017 1155   K 3.7 03/03/2018 0945   CL 96 (L) 03/03/2018 0945   CO2 25 03/03/2018 0945   BUN 16 03/03/2018 0945   BUN 16 08/29/2017 1155   CREATININE 0.83 03/03/2018 0945   CREATININE 0.83 09/27/2014 0001      Component Value Date/Time   CALCIUM 9.3 03/03/2018 0945   ALKPHOS 113 03/03/2018 0945   AST 15 03/03/2018 0945   ALT 30 03/03/2018 0945   BILITOT 0.8 03/03/2018 0945

## 2018-03-10 NOTE — Telephone Encounter (Signed)
Appointments cancelled per 2/24 staff message

## 2018-03-10 NOTE — Patient Instructions (Addendum)
Medication Instructions:  Your physician has recommended you make the following change in your medication: 1. DECREASE Diltiazem to 120 mg daily  * If you need a refill on your cardiac medications before your next appointment, please call your pharmacy.   Labwork: None ordered  Testing/Procedures: None ordered  Follow-Up: Your physician recommends that you schedule a follow-up appointment in: 3 months with Dr. Curt Bears.  Thank you for choosing [facility]!!   Trinidad Curet, RN [phone]

## 2018-03-10 NOTE — Patient Instructions (Signed)
Dehydration, Adult  Dehydration is when there is not enough fluid or water in your body. This happens when you lose more fluids than you take in. Dehydration can range from mild to very bad. It should be treated right away to keep it from getting very bad. Symptoms of mild dehydration may include:  Thirst.  Dry lips.  Slightly dry mouth.  Dry, warm skin.  Dizziness. Symptoms of moderate dehydration may include:  Very dry mouth.  Muscle cramps.  Dark pee (urine). Pee may be the color of tea.  Your body making less pee.  Your eyes making fewer tears.  Heartbeat that is uneven or faster than normal (palpitations).  Headache.  Light-headedness, especially when you stand up from sitting.  Fainting (syncope). Symptoms of very bad dehydration may include:  Changes in skin, such as: ? Cold and clammy skin. ? Blotchy (mottled) or pale skin. ? Skin that does not quickly return to normal after being lightly pinched and let go (poor skin turgor).  Changes in body fluids, such as: ? Feeling very thirsty. ? Your eyes making fewer tears. ? Not sweating when body temperature is high, such as in hot weather. ? Your body making very little pee.  Changes in vital signs, such as: ? Weak pulse. ? Pulse that is more than 100 beats a minute when you are sitting still. ? Fast breathing. ? Low blood pressure.  Other changes, such as: ? Sunken eyes. ? Cold hands and feet. ? Confusion. ? Lack of energy (lethargy). ? Trouble waking up from sleep. ? Short-term weight loss. ? Unconsciousness. Follow these instructions at home:   If told by your doctor, drink an ORS: ? Make an ORS by using instructions on the package. ? Start by drinking small amounts, about  cup (120 mL) every 5-10 minutes. ? Slowly drink more until you have had the amount that your doctor said to have.  Drink enough clear fluid to keep your pee clear or pale yellow. If you were told to drink an ORS, finish the  ORS first, then start slowly drinking clear fluids. Drink fluids such as: ? Water. Do not drink only water by itself. Doing that can make the salt (sodium) level in your body get too low (hyponatremia). ? Ice chips. ? Fruit juice that you have added water to (diluted). ? Low-calorie sports drinks.  Avoid: ? Alcohol. ? Drinks that have a lot of sugar. These include high-calorie sports drinks, fruit juice that does not have water added, and soda. ? Caffeine. ? Foods that are greasy or have a lot of fat or sugar.  Take over-the-counter and prescription medicines only as told by your doctor.  Do not take salt tablets. Doing that can make the salt level in your body get too high (hypernatremia).  Eat foods that have minerals (electrolytes). Examples include bananas, oranges, potatoes, tomatoes, and spinach.  Keep all follow-up visits as told by your doctor. This is important. Contact a doctor if:  You have belly (abdominal) pain that: ? Gets worse. ? Stays in one area (localizes).  You have a rash.  You have a stiff neck.  You get angry or annoyed more easily than normal (irritability).  You are more sleepy than normal.  You have a harder time waking up than normal.  You feel: ? Weak. ? Dizzy. ? Very thirsty.  You have peed (urinated) only a small amount of very dark pee during 6-8 hours. Get help right away if:  You have   symptoms of very bad dehydration.  You cannot drink fluids without throwing up (vomiting).  Your symptoms get worse with treatment.  You have a fever.  You have a very bad headache.  You are throwing up or having watery poop (diarrhea) and it: ? Gets worse. ? Does not go away.  You have blood or something green (bile) in your throw-up.  You have blood in your poop (stool). This may cause poop to look black and tarry.  You have not peed in 6-8 hours.  You pass out (faint).  Your heart rate when you are sitting still is more than 100 beats a  minute.  You have trouble breathing. This information is not intended to replace advice given to you by your health care provider. Make sure you discuss any questions you have with your health care provider. Document Released: 10/28/2008 Document Revised: 07/22/2015 Document Reviewed: 02/25/2015 Elsevier Interactive Patient Education  2019 [employer]  

## 2018-03-10 NOTE — Progress Notes (Signed)
Electrophysiology Office Note   Date:  03/10/2018   ID:  Bradley Novak 1950/01/20, MRN [id number]  PCP:  Carlena Hurl, PA-C  Cardiologist:  Nahser Primary Electrophysiologist:  Will Meredith Leeds, MD    No chief complaint on file.    History of Present Illness: Bradley Novak is a 68 y.o. male who is being seen today for the evaluation of SVT at the request of Nahser. Presenting today for electrophysiology evaluation.  He has a history of Hodgkin's lymphoma, hypertension, and ORT.  He underwent catheter ablation 01/29/2018 with successful ablation of an accessory pathway at CS 5 6.  Unfortunately he came back the next day with recurrent SVT.    Today, he denies symptoms of palpitations, chest pain, PND, lower extremity edema, claudication, dizziness, presyncope, syncope, bleeding, or neurologic sequela. The patient is tolerating medications without difficulties.  He is restarted his chemotherapy regimen.  He feels quite poorly today with shortness of breath.  He is noted no further episodes of SVT since increasing his metoprolol.  He has noted that his blood pressure has dropped into the 1 teens which makes him feel weak and fatigued.   Past Medical History:  Diagnosis Date  . History of hiatal hernia   . Hodgkin's lymphoma ([place]) 09/2017  . Hypertension 2006  . Obesity   . Pneumonia 09/2017  . SVT (supraventricular tachycardia) ([place])   . Wears glasses    Past Surgical History:  Procedure Laterality Date  . CARDIAC CATHETERIZATION  1980s  . IR IMAGING GUIDED PORT INSERTION  11/20/2017  . SVT ABLATION  01/29/2018  . SVT ABLATION N/A 01/29/2018   Procedure: SVT ABLATION;  Surgeon: Constance Haw, MD;  Location: [facility];  Service: Cardiovascular;  Laterality: N/A;     Current Outpatient Medications  Medication Sig Dispense Refill  . amoxicillin (AMOXIL) 500 MG capsule Take 500 mg by mouth 2 (two) times daily.    Marland Kitchen aspirin EC 81 MG tablet  Take 1 tablet (81 mg total) by mouth daily. 90 tablet 3  . atorvastatin (LIPITOR) 20 MG tablet TAKE 1 TABLET BY MOUTH DAILY (Patient taking differently: Take 20 mg by mouth daily. ) 90 tablet 2  . benzonatate (TESSALON) 100 MG capsule Take 1 capsule (100 mg total) by mouth 3 (three) times daily as needed for up to 30 days for cough. 60 capsule 3  . Cholecalciferol (VITAMIN D PO) Take 1 capsule by mouth at bedtime.    Marland Kitchen dexamethasone (DECADRON) 4 MG tablet Take 2 tablets by mouth once a day on the day after chemotherapy and then take 2 tablets two times a day for 2 days. Take with food. 30 tablet 1  . diclofenac (VOLTAREN) 75 MG EC tablet Take 75 mg by mouth daily.    Marland Kitchen diltiazem (CARDIZEM CD) 240 MG 24 hr capsule Take 1 capsule (240 mg total) by mouth daily. 90 capsule 1  . diltiazem (CARDIZEM) 30 MG tablet Take 30 mg by mouth daily as needed (RAPID HEART RATE).    . DULoxetine (CYMBALTA) 30 MG capsule Take 1 capsule (30 mg total) by mouth daily for 7 days, THEN 2 capsules (60 mg total) daily for 30 days. 67 capsule 5  . HYDROcodone-acetaminophen (NORCO) 7.5-325 MG tablet Take 1 tablet by mouth every 6 (six) hours as needed for moderate pain. 12 tablet 0  . ibuprofen (ADVIL,MOTRIN) 200 MG tablet Take 400 mg by mouth daily as needed for moderate pain.    Marland Kitchen  LORazepam (ATIVAN) 0.5 MG tablet Take 1 tablet (0.5 mg total) by mouth every 6 (six) hours as needed (Nausea or vomiting). 30 tablet 0  . metoCLOPramide (REGLAN) 10 MG tablet Take 1 tablet (10 mg total) by mouth 3 (three) times daily before meals for 14 days. 42 tablet 0  . metoprolol succinate (TOPROL XL) 100 MG 24 hr tablet Take 1 tablet (100 mg total) by mouth 2 (two) times daily. Take with or immediately following a meal. 180 tablet 3  . nitroGLYCERIN (NITROSTAT) 0.3 MG SL tablet Place 1 tablet (0.3 mg total) under the tongue every 5 (five) minutes as needed for chest pain. 30 tablet 0  . potassium chloride SA (K-DUR,KLOR-CON) 20 MEQ tablet Take  1 tablet (20 mEq total) by mouth 2 (two) times daily. 180 tablet 0  . prochlorperazine (COMPAZINE) 10 MG tablet Take 1 tablet (10 mg total) by mouth every 6 (six) hours as needed (Nausea or vomiting). 30 tablet 1  . ranitidine (ZANTAC) 150 MG tablet Take 150 mg by mouth daily as needed for heartburn.    . vitamin B-12 (CYANOCOBALAMIN) 100 MCG tablet Take 100 mcg by mouth daily.     No current facility-administered medications for this visit.     Allergies:   Patient has no known allergies.   Social History:  The patient  reports that he quit smoking about 46 years ago. His smoking use included cigarettes. He has a 10.00 pack-year smoking history. He has never used smokeless tobacco. He reports that he does not drink alcohol or use drugs.   Family History:  The patient's family history includes Hypertension in his brother, brother, brother, brother, and father.    ROS:  Please see the history of present illness.   Otherwise, review of systems is positive for weight change, appetite change, fatigue, shortness of breath, depression.   All other systems are reviewed and negative.    PHYSICAL EXAM: VS:  BP 124/64   Pulse 65   Ht 5\' 11"  (1.803 m)   Wt 240 lb (108.9 kg)   BMI 33.47 kg/m  , BMI Body mass index is 33.47 kg/m. GEN: Well nourished, well developed, in no acute distress  HEENT: normal  Neck: no JVD, carotid bruits, or masses Cardiac: RRR; no murmurs, rubs, or gallops,no edema  Respiratory:  clear to auscultation bilaterally, normal work of breathing GI: soft, nontender, nondistended, + BS MS: no deformity or atrophy  Skin: warm and dry Neuro:  Strength and sensation are intact Psych: euthymic mood, full affect  EKG:  EKG is ordered today. Personal review of the ekg ordered shows sinus rhythm, rate 65   Recent Labs: 01/21/2018: TSH 1.149 03/10/2018: ALT 25; BUN 21; Creatinine 0.74; Hemoglobin 10.1; Magnesium 1.8; Platelet Count 198; Potassium 3.7; Sodium 132    Lipid  Panel     Component Value Date/Time   CHOL 131 08/29/2017 1155   TRIG 85 08/29/2017 1155   HDL 44 08/29/2017 1155   CHOLHDL 3.0 08/29/2017 1155   CHOLHDL 5.1 (H) 09/27/2014 0001   VLDL 37 (H) 09/27/2014 0001   LDLCALC 70 08/29/2017 1155     Wt Readings from Last 3 Encounters:  03/10/18 240 lb (108.9 kg)  03/10/18 240 lb 0.6 oz (108.9 kg)  03/03/18 233 lb 12 oz (106 kg)      Other studies Reviewed: Additional studies/ records that were reviewed today include: TTE 11/15/17  Review of the above records today demonstrates:  - Left ventricle: The cavity size was  normal. Systolic function was   normal. The estimated ejection fraction was in the range of 55%   to 60%. Wall motion was normal; there were no regional wall   motion abnormalities. Left ventricular diastolic function   parameters were normal.   ASSESSMENT AND PLAN:  1.  ORT: Status post ablation 01/29/2018.  Unfortunately he has had a recurrence with likely the same tachycardia.  On metoprolol and diltiazem.  He is having some low blood pressures on his medications.  We will thus decrease his diltiazem as he has had a good result with metoprolol.    Current medicines are reviewed at length with the patient today.   The patient does not have concerns regarding his medicines.  The following changes were made today: Decrease diltiazem  Labs/ tests ordered today include:  Orders Placed This Encounter  Procedures  . EKG 12-Lead     Disposition:   FU with Will Camnitz 3 months  Signed, Will Meredith Leeds, MD  03/10/2018 4:04 PM     [place] [address] [phone] (office) [phone] (fax)

## 2018-03-11 ENCOUNTER — Inpatient Hospital Stay: Payer: Medicare Other

## 2018-03-12 ENCOUNTER — Inpatient Hospital Stay: Payer: Medicare Other

## 2018-03-12 ENCOUNTER — Ambulatory Visit: Payer: Medicare Other | Admitting: Cardiovascular Disease

## 2018-03-12 VITALS — BP 120/66 | HR 66 | Temp 97.6°F | Resp 18 | Wt 236.8 lb

## 2018-03-12 DIAGNOSIS — C8190 Hodgkin lymphoma, unspecified, unspecified site: Secondary | ICD-10-CM | POA: Diagnosis not present

## 2018-03-12 DIAGNOSIS — D701 Agranulocytosis secondary to cancer chemotherapy: Secondary | ICD-10-CM | POA: Diagnosis not present

## 2018-03-12 DIAGNOSIS — D6481 Anemia due to antineoplastic chemotherapy: Secondary | ICD-10-CM | POA: Diagnosis not present

## 2018-03-12 DIAGNOSIS — Z7689 Persons encountering health services in other specified circumstances: Secondary | ICD-10-CM | POA: Diagnosis not present

## 2018-03-12 DIAGNOSIS — T451X5A Adverse effect of antineoplastic and immunosuppressive drugs, initial encounter: Secondary | ICD-10-CM | POA: Diagnosis not present

## 2018-03-12 DIAGNOSIS — Z5111 Encounter for antineoplastic chemotherapy: Secondary | ICD-10-CM | POA: Diagnosis not present

## 2018-03-12 MED ORDER — SODIUM CHLORIDE 0.9% FLUSH
10.0000 mL | Freq: Once | INTRAVENOUS | Status: AC | PRN
  Administered 2018-03-12: 10 mL
  Filled 2018-03-12: qty 10

## 2018-03-12 MED ORDER — SODIUM CHLORIDE 0.9 % IV SOLN
Freq: Once | INTRAVENOUS | Status: AC
  Administered 2018-03-12: 11:00:00 via INTRAVENOUS
  Filled 2018-03-12: qty 250

## 2018-03-12 MED ORDER — HEPARIN SOD (PORK) LOCK FLUSH 100 UNIT/ML IV SOLN
500.0000 [IU] | Freq: Once | INTRAVENOUS | Status: AC | PRN
  Administered 2018-03-12: 500 [IU]
  Filled 2018-03-12: qty 5

## 2018-03-12 NOTE — Progress Notes (Signed)
Dr. Maylon Peppers out of clinic but he is aware that patient reports frequent urination at home at night, no dysuria and no lasix use but he reported 10lb weight loss in 48 hours. Weight confirmed here to be just under 4 lbs since 2/24 and MD said NO further orders or tests at this time. Pt feels well otherwise. Patient wanted the liter of fluids today, did not want to not have them even though MD said he does not have to have them

## 2018-03-13 ENCOUNTER — Ambulatory Visit: Payer: Medicare Other

## 2018-03-14 ENCOUNTER — Ambulatory Visit: Payer: Medicare Other

## 2018-03-17 ENCOUNTER — Ambulatory Visit: Payer: Medicare Other

## 2018-03-17 ENCOUNTER — Encounter: Payer: Self-pay | Admitting: Hematology

## 2018-03-17 ENCOUNTER — Inpatient Hospital Stay: Payer: Medicare Other

## 2018-03-17 ENCOUNTER — Inpatient Hospital Stay: Payer: Medicare Other | Attending: Hematology | Admitting: Hematology

## 2018-03-17 ENCOUNTER — Other Ambulatory Visit: Payer: Self-pay

## 2018-03-17 DIAGNOSIS — F329 Major depressive disorder, single episode, unspecified: Secondary | ICD-10-CM

## 2018-03-17 DIAGNOSIS — K047 Periapical abscess without sinus: Secondary | ICD-10-CM | POA: Diagnosis not present

## 2018-03-17 DIAGNOSIS — C8198 Hodgkin lymphoma, unspecified, lymph nodes of multiple sites: Secondary | ICD-10-CM | POA: Diagnosis not present

## 2018-03-17 DIAGNOSIS — E871 Hypo-osmolality and hyponatremia: Secondary | ICD-10-CM

## 2018-03-17 DIAGNOSIS — Z5111 Encounter for antineoplastic chemotherapy: Secondary | ICD-10-CM

## 2018-03-17 DIAGNOSIS — D6481 Anemia due to antineoplastic chemotherapy: Secondary | ICD-10-CM | POA: Diagnosis not present

## 2018-03-17 DIAGNOSIS — T451X5A Adverse effect of antineoplastic and immunosuppressive drugs, initial encounter: Secondary | ICD-10-CM

## 2018-03-17 DIAGNOSIS — D701 Agranulocytosis secondary to cancer chemotherapy: Secondary | ICD-10-CM | POA: Insufficient documentation

## 2018-03-17 DIAGNOSIS — C8190 Hodgkin lymphoma, unspecified, unspecified site: Secondary | ICD-10-CM

## 2018-03-17 DIAGNOSIS — E46 Unspecified protein-calorie malnutrition: Secondary | ICD-10-CM | POA: Diagnosis not present

## 2018-03-17 DIAGNOSIS — F32A Depression, unspecified: Secondary | ICD-10-CM

## 2018-03-17 DIAGNOSIS — I700 Atherosclerosis of aorta: Secondary | ICD-10-CM

## 2018-03-17 DIAGNOSIS — K5900 Constipation, unspecified: Secondary | ICD-10-CM | POA: Diagnosis not present

## 2018-03-17 DIAGNOSIS — I471 Supraventricular tachycardia, unspecified: Secondary | ICD-10-CM

## 2018-03-17 DIAGNOSIS — Z79899 Other long term (current) drug therapy: Secondary | ICD-10-CM | POA: Diagnosis not present

## 2018-03-17 DIAGNOSIS — Z7982 Long term (current) use of aspirin: Secondary | ICD-10-CM | POA: Insufficient documentation

## 2018-03-17 LAB — CMP (CANCER CENTER ONLY)
ALT: 27 U/L (ref 0–44)
AST: 17 U/L (ref 15–41)
Albumin: 3.7 g/dL (ref 3.5–5.0)
Alkaline Phosphatase: 90 U/L (ref 38–126)
Anion gap: 10 (ref 5–15)
BUN: 13 mg/dL (ref 8–23)
CO2: 26 mmol/L (ref 22–32)
Calcium: 9.2 mg/dL (ref 8.9–10.3)
Chloride: 96 mmol/L — ABNORMAL LOW (ref 98–111)
Creatinine: 0.71 mg/dL (ref 0.61–1.24)
GFR, Estimated: >60 mL/min (ref >60)
Glucose, Bld: 143 mg/dL — ABNORMAL HIGH (ref 70–99)
Potassium: 3.4 mmol/L — ABNORMAL LOW (ref 3.5–5.1)
Sodium: 132 mmol/L — ABNORMAL LOW (ref 135–145)
Total Bilirubin: 0.6 mg/dL (ref 0.3–1.2)
Total Protein: 6.5 g/dL (ref 6.5–8.1)

## 2018-03-17 LAB — CBC WITH DIFFERENTIAL (CANCER CENTER ONLY)
Abs Immature Granulocytes: 0.16 10*3/uL — ABNORMAL HIGH (ref 0.00–0.07)
Basophils Absolute: 0 10*3/uL (ref 0.0–0.1)
Basophils Relative: 1 %
Eosinophils Absolute: 0 10*3/uL (ref 0.0–0.5)
Eosinophils Relative: 2 %
HCT: 30.4 % — ABNORMAL LOW (ref 39.0–52.0)
Hemoglobin: 9.8 g/dL — ABNORMAL LOW (ref 13.0–17.0)
Immature Granulocytes: 6 %
Lymphocytes Relative: 34 %
Lymphs Abs: 0.9 10*3/uL (ref 0.7–4.0)
MCH: 27.3 pg (ref 26.0–34.0)
MCHC: 32.2 g/dL (ref 30.0–36.0)
MCV: 84.7 fL (ref 80.0–100.0)
Monocytes Absolute: 1 10*3/uL (ref 0.1–1.0)
Monocytes Relative: 35 %
Neutro Abs: 0.6 10*3/uL — ABNORMAL LOW (ref 1.7–7.7)
Neutrophils Relative %: 22 %
Platelet Count: 253 10*3/uL (ref 150–400)
RBC: 3.59 MIL/uL — ABNORMAL LOW (ref 4.22–5.81)
RDW: 17.2 % — ABNORMAL HIGH (ref 11.5–15.5)
WBC Count: 2.7 10*3/uL — ABNORMAL LOW (ref 4.0–10.5)
nRBC: 0 % (ref 0.0–0.2)

## 2018-03-17 LAB — MAGNESIUM: Magnesium: 1.8 mg/dL (ref 1.7–2.4)

## 2018-03-17 MED ORDER — SODIUM CHLORIDE 0.9 % IV SOLN
Freq: Once | INTRAVENOUS | Status: AC
  Administered 2018-03-17: 10:00:00 via INTRAVENOUS
  Filled 2018-03-17: qty 250

## 2018-03-17 NOTE — Patient Instructions (Addendum)
Dehydration, Adult  Dehydration is when there is not enough fluid or water in your body. This happens when you lose more fluids than you take in. Dehydration can range from mild to very bad. It should be treated right away to keep it from getting very bad. Symptoms of mild dehydration may include:  Thirst.  Dry lips.  Slightly dry mouth.  Dry, warm skin.  Dizziness. Symptoms of moderate dehydration may include:  Very dry mouth.  Muscle cramps.  Dark pee (urine). Pee may be the color of tea.  Your body making less pee.  Your eyes making fewer tears.  Heartbeat that is uneven or faster than normal (palpitations).  Headache.  Light-headedness, especially when you stand up from sitting.  Fainting (syncope). Symptoms of very bad dehydration may include:  Changes in skin, such as: ? Cold and clammy skin. ? Blotchy (mottled) or pale skin. ? Skin that does not quickly return to normal after being lightly pinched and let go (poor skin turgor).  Changes in body fluids, such as: ? Feeling very thirsty. ? Your eyes making fewer tears. ? Not sweating when body temperature is high, such as in hot weather. ? Your body making very little pee.  Changes in vital signs, such as: ? Weak pulse. ? Pulse that is more than 100 beats a minute when you are sitting still. ? Fast breathing. ? Low blood pressure.  Other changes, such as: ? Sunken eyes. ? Cold hands and feet. ? Confusion. ? Lack of energy (lethargy). ? Trouble waking up from sleep. ? Short-term weight loss. ? Unconsciousness. Follow these instructions at home:   If told by your doctor, drink an ORS: ? Make an ORS by using instructions on the package. ? Start by drinking small amounts, about  cup (120 mL) every 5-10 minutes. ? Slowly drink more until you have had the amount that your doctor said to have.  Drink enough clear fluid to keep your pee clear or pale yellow. If you were told to drink an ORS, finish the  ORS first, then start slowly drinking clear fluids. Drink fluids such as: ? Water. Do not drink only water by itself. Doing that can make the salt (sodium) level in your body get too low (hyponatremia). ? Ice chips. ? Fruit juice that you have added water to (diluted). ? Low-calorie sports drinks.  Avoid: ? Alcohol. ? Drinks that have a lot of sugar. These include high-calorie sports drinks, fruit juice that does not have water added, and soda. ? Caffeine. ? Foods that are greasy or have a lot of fat or sugar.  Take over-the-counter and prescription medicines only as told by your doctor.  Do not take salt tablets. Doing that can make the salt level in your body get too high (hypernatremia).  Eat foods that have minerals (electrolytes). Examples include bananas, oranges, potatoes, tomatoes, and spinach.  Keep all follow-up visits as told by your doctor. This is important. Contact a doctor if:  You have belly (abdominal) pain that: ? Gets worse. ? Stays in one area (localizes).  You have a rash.  You have a stiff neck.  You get angry or annoyed more easily than normal (irritability).  You are more sleepy than normal.  You have a harder time waking up than normal.  You feel: ? Weak. ? Dizzy. ? Very thirsty.  You have peed (urinated) only a small amount of very dark pee during 6-8 hours. Get help right away if:  You have   symptoms of very bad dehydration.  You cannot drink fluids without throwing up (vomiting).  Your symptoms get worse with treatment.  You have a fever.  You have a very bad headache.  You are throwing up or having watery poop (diarrhea) and it: ? Gets worse. ? Does not go away.  You have blood or something green (bile) in your throw-up.  You have blood in your poop (stool). This may cause poop to look black and tarry.  You have not peed in 6-8 hours.  You pass out (faint).  Your heart rate when you are sitting still is more than 100 beats a  minute.  You have trouble breathing. This information is not intended to replace advice given to you by your health care provider. Make sure you discuss any questions you have with your health care provider. Document Released: 10/28/2008 Document Revised: 07/22/2015 Document Reviewed: 02/25/2015 Elsevier Interactive Patient Education  2019 [employer]  

## 2018-03-17 NOTE — Patient Instructions (Signed)
Dehydration, Adult  Dehydration is when there is not enough fluid or water in your body. This happens when you lose more fluids than you take in. Dehydration can range from mild to very bad. It should be treated right away to keep it from getting very bad. Symptoms of mild dehydration may include:  Thirst.  Dry lips.  Slightly dry mouth.  Dry, warm skin.  Dizziness. Symptoms of moderate dehydration may include:  Very dry mouth.  Muscle cramps.  Dark pee (urine). Pee may be the color of tea.  Your body making less pee.  Your eyes making fewer tears.  Heartbeat that is uneven or faster than normal (palpitations).  Headache.  Light-headedness, especially when you stand up from sitting.  Fainting (syncope). Symptoms of very bad dehydration may include:  Changes in skin, such as: ? Cold and clammy skin. ? Blotchy (mottled) or pale skin. ? Skin that does not quickly return to normal after being lightly pinched and let go (poor skin turgor).  Changes in body fluids, such as: ? Feeling very thirsty. ? Your eyes making fewer tears. ? Not sweating when body temperature is high, such as in hot weather. ? Your body making very little pee.  Changes in vital signs, such as: ? Weak pulse. ? Pulse that is more than 100 beats a minute when you are sitting still. ? Fast breathing. ? Low blood pressure.  Other changes, such as: ? Sunken eyes. ? Cold hands and feet. ? Confusion. ? Lack of energy (lethargy). ? Trouble waking up from sleep. ? Short-term weight loss. ? Unconsciousness. Follow these instructions at home:   If told by your doctor, drink an ORS: ? Make an ORS by using instructions on the package. ? Start by drinking small amounts, about  cup (120 mL) every 5-10 minutes. ? Slowly drink more until you have had the amount that your doctor said to have.  Drink enough clear fluid to keep your pee clear or pale yellow. If you were told to drink an ORS, finish the  ORS first, then start slowly drinking clear fluids. Drink fluids such as: ? Water. Do not drink only water by itself. Doing that can make the salt (sodium) level in your body get too low (hyponatremia). ? Ice chips. ? Fruit juice that you have added water to (diluted). ? Low-calorie sports drinks.  Avoid: ? Alcohol. ? Drinks that have a lot of sugar. These include high-calorie sports drinks, fruit juice that does not have water added, and soda. ? Caffeine. ? Foods that are greasy or have a lot of fat or sugar.  Take over-the-counter and prescription medicines only as told by your doctor.  Do not take salt tablets. Doing that can make the salt level in your body get too high (hypernatremia).  Eat foods that have minerals (electrolytes). Examples include bananas, oranges, potatoes, tomatoes, and spinach.  Keep all follow-up visits as told by your doctor. This is important. Contact a doctor if:  You have belly (abdominal) pain that: ? Gets worse. ? Stays in one area (localizes).  You have a rash.  You have a stiff neck.  You get angry or annoyed more easily than normal (irritability).  You are more sleepy than normal.  You have a harder time waking up than normal.  You feel: ? Weak. ? Dizzy. ? Very thirsty.  You have peed (urinated) only a small amount of very dark pee during 6-8 hours. Get help right away if:  You have   symptoms of very bad dehydration.  You cannot drink fluids without throwing up (vomiting).  Your symptoms get worse with treatment.  You have a fever.  You have a very bad headache.  You are throwing up or having watery poop (diarrhea) and it: ? Gets worse. ? Does not go away.  You have blood or something green (bile) in your throw-up.  You have blood in your poop (stool). This may cause poop to look black and tarry.  You have not peed in 6-8 hours.  You pass out (faint).  Your heart rate when you are sitting still is more than 100 beats a  minute.  You have trouble breathing. This information is not intended to replace advice given to you by your health care provider. Make sure you discuss any questions you have with your health care provider. Document Released: 10/28/2008 Document Revised: 07/22/2015 Document Reviewed: 02/25/2015 Elsevier Interactive Patient Education  2019 [employer]  

## 2018-03-17 NOTE — Progress Notes (Signed)
[facility]  Patient Care Team: Tysinger, Camelia Eng, PA-C as PCP - General (Family Medicine) Nahser, Wonda Cheng, MD as PCP - Cardiology (Cardiology) Constance Haw, MD as PCP - Electrophysiology (Cardiology)  HEME/ONC OVERVIEW: 1. Stage III classic Hodgkin lymphoma, subtype NOS due to limited specimen -Bx-proven R cervical LN involvement in 09/2017; PET in 11/2017 showed cervical, mediastinal and upper abdominal LN involement; treatment delayed due to pt traveling on a cruise after diagnosis -11/2017 - present: ABVD -12/2017: PET after 2 cycles of ABVD showed interval CR  -Chemotherapy delayed due to SVT requiring ablation in 01/2018 -Mid-01/2018 - present: AVD   2. Port placement in 11/2017   CHEMOTHERAPY REGIMEN:  11/26/2017 - 01/07/2018: ABVD x 2 cycles, CR  02/04/2018 - present: AVD, plan for 4 cycles  ASSESSMENT & PLAN:   Stage III classic Hodgkin lymphoma, favorable risk  -S/p C4D1 of AVD on 03/04/2018 -Labs adequate, proceed with C4D15 on 03/17/2018  -IV fluid during the 2nd week of each chemotherapy treatment due to poor PO intake -I have had multiple lengthy discussions regarding the timing of treatment and expected side effects; patient is concerned about his appetite as well as some of his family events, including a trip for his grandson in 05/2018, and inquired about the potential impact of delaying treatment; I emphasized with the patient that objectively, his labs do not warrant any delay, including his weight with aggressive fluid and nutritional support; furthermore, there is no data on how much treatment efficacy would be decreased due to delay, especially on a voluntary basis -I reviewed the NCCN guideline with the patient; in patients with CR after 2 cycles of ABVD, the plan is for a total of 4 cycles of AVD  -After extensive discussion today, patient has decided to receive Cycle 5 of AVD but forego Cycle 6; patient expressed  understanding that by foregoing the last cycle of treatment, it can potentially impact the chance of remission and lead to increased risk of disease relapse -We will plan for PET scan in 3-4 weeks after the last chemotherapy treatment to assess response  -PRN anti-emetics: Zofran, Compazine, dexamethasone, and Ativan  Chemotherapy-associated neutropenia -Secondary to AVD -WBC 2.7k with ANC 600, stable  -Patient denies any symptoms of infection -In the absence of infection, neutropenia alone would not delay treatment for Hodgkin lymphoma -We will monitor for now; no dose adjustment indicated  Chemotherapy-associated anemia -Secondary to AVD -Hgb 9.8 today, stable -Patient denies any symptoms of bleeding -We will monitor for now; no indication for dose adjustment  Hyponatremia -Secondary to decreased p.o. intake -Na 132 today, stable -IV fluid support as outlined above -I also counseled the patient on increasing his salt intake modestly (1 teaspoon of salt 2x/day)   Malnutrition -Weight overall stable over the past several weeks -I encouraged patient to increase p.o. intake as tolerated and continue nutritional supplements (goal 4 to 6 cans/day when not taking in adequate p.o. diet) -IV fluid support as outlined above  Depression -Patient reports that his mood is improving since starting Cymbalta; no SI/HI -Continue Cymbalta 60 mg daily -As he has only been on Cymbalta for two to three weeks, I explained to the patient that the medication is not yet fulling working, and he may take PRN Ativan for anxiety  -I counseled the patient on any concerning symptoms, such as worsening mood, SI, or HI, for which he should contact the clinic promptly  History of SVT -S/p ablation in mid-01/2018 -He  denies any symptoms recurrent palpitation, chest pain, or syncope -Continue ER diltiazem, metoprolol and PRN IR diltiazem   No orders of the defined types were placed in this encounter.  All  questions were answered. The patient knows to call the clinic with any problems, questions or concerns. No barriers to learning was detected.  Return  03/31/2018 for labs, port flush and clinic appt prior to C5D1 of AVD.   [name], MD 03/17/2018 11:02 AM  CHIEF COMPLAINT: "I just can't eat "  INTERVAL HISTORY: [facility] returns to clinic for follow-up of Hodgkin lymphoma prior to C4D15 of AVD.  Patient reports that he has had very limited appetite over the past week, but is able to continue nutritional supplements every day.  He also feels that his energy level is very poor, and feels depressed about his inability to participate in the activities that he used to enjoy, such as golf, walk, and going to the market.  He otherwise denies any fever, chill, weight change, lymphadenopathy, chest pain, dyspnea, abdominal pain, nausea, vomiting, or diarrhea.  SUMMARY OF ONCOLOGIC HISTORY:   Hodgkin lymphoma ([facility])   08/29/2017 Initial Diagnosis    Hodgkin lymphoma ([facility])    10/07/2017 Imaging    CT neck w/ contrast: Enlarged nodes in the right jugular chain, posterior triangle, and supraclavicular fossa. The pattern is most concerning for lymphoma; multiple nodes are superficial and amenable to biopsy.  CT CAP w/ contrast: 1. Right supraclavicular and subcarinal adenopathy is identified. Differential considerations include lymphoproliferative disorder, metastatic adenopathy, granulomatous inflammation/infection or reactive adenopathy. Consider further evaluation with tissue sampling. 2. No acute findings, mass or adenopathy within the abdomen. 3. Lungs are clear.  No evidence for pneumonia.  4.  Aortic Atherosclerosis (ICD10-I70.0).    10/25/2017 Procedure    US-guided R cervical LN biopsy    10/25/2017 Pathology Results    (Accession: [id number]) Lymph node, needle/core biopsy, Right Cervical - CLASSIC HODGKIN LYMPHOMA - SEE COMMENT - CD30 (1%) - Unable to subtype due to limited sample  specimen    11/12/2017 Cancer Staging    Staging form: Hodgkin and Non-Hodgkin Lymphoma, AJCC 8th Edition - Clinical stage from 11/12/2017: Stage II (Hodgkin lymphoma, A - Asymptomatic) - Signed by [name], MD on 11/12/2017    11/21/2017 -  Chemotherapy    The patient had DOXOrubicin (ADRIAMYCIN) chemo injection 60 mg, 25 mg/m2 = 60 mg, Intravenous,  Once, 0 of 4 cycles palonosetron (ALOXI) injection 0.25 mg, 0.25 mg, Intravenous,  Once, 0 of 4 cycles bleomycin (BLEOCIN) 24 Units in sodium chloride 0.9 % 50 mL chemo infusion, 10 Units/m2 = 24 Units, Intravenous,  Once, 0 of 4 cycles dacarbazine (DTIC) 910 mg in sodium chloride 0.9 % 250 mL chemo infusion, 375 mg/m2 = 910 mg, Intravenous,  Once, 0 of 4 cycles vinBLAStine (VELBAN) 14.6 mg in sodium chloride 0.9 % 50 mL chemo infusion, 6 mg/m2 = 14.6 mg, Intravenous, Once, 0 of 4 cycles fosaprepitant (EMEND) 150 mg, dexamethasone (DECADRON) 12 mg in sodium chloride 0.9 % 145 mL IVPB, , Intravenous,  Once, 0 of 4 cycles  for chemotherapy treatment.     11/21/2017 Imaging    PET: IMPRESSION: 1. Multiple relatively small hypermetabolic lymph nodes in the RIGHT neck, mediastinum and limited involvement of the upper abdomen. Lymph node activity is high (greater than liver, [place] 4). 2. The most intense enlarged lymph node is anterior to the sternocleidomastoid muscle on the RIGHT. There are multiple assessable lymph nodes in the RIGHT cervical  chain. 3. Minimal hypermetabolic adenopathy in the upper abdomen. No pelvic or inguinal metastatic adenopathy. 4. Normal spleen and bone marrow. 5. Single focus of metabolic activity in the sigmoid colon. Recommend screening colonoscopy if patient is not current for such.    01/10/2018 Imaging    PET (after 2 cycles of ABVD) IMPRESSION: 1. Interval complete response to therapy. No abnormal areas of persistent hypermetabolic adenopathy identified. 2.  Aortic Atherosclerosis (ICD10-I70.0). 3. Multi  vessel coronary artery atherosclerotic calcifications.     REVIEW OF SYSTEMS:   Constitutional: ( - ) fevers, ( - )  chills , ( - ) night sweats Eyes: ( - ) blurriness of vision, ( - ) double vision, ( - ) watery eyes Ears, nose, mouth, throat, and face: ( - ) mucositis, ( - ) sore throat Respiratory: ( - ) cough, ( - ) dyspnea, ( - ) wheezes Cardiovascular: ( - ) palpitation, ( - ) chest discomfort, ( - ) lower extremity swelling Gastrointestinal:  ( - ) nausea, ( - ) heartburn, ( - ) change in bowel habits Skin: ( - ) abnormal skin rashes Lymphatics: ( - ) new lymphadenopathy, ( - ) easy bruising Neurological: ( - ) numbness, ( - ) tingling, ( - ) new weaknesses Behavioral/Psych: ( - ) mood change, ( - ) new changes  All other systems were reviewed with the patient and are negative.  I have reviewed the past medical history, past surgical history, social history and family history with the patient and they are unchanged from previous [facility].  ALLERGIES:  has No Known Allergies.  MEDICATIONS:  Current Outpatient Medications  Medication Sig Dispense Refill  . amoxicillin (AMOXIL) 500 MG capsule Take 500 mg by mouth 2 (two) times daily.    Marland Kitchen aspirin EC 81 MG tablet Take 1 tablet (81 mg total) by mouth daily. 90 tablet 3  . atorvastatin (LIPITOR) 20 MG tablet TAKE 1 TABLET BY MOUTH DAILY (Patient taking differently: Take 20 mg by mouth daily. ) 90 tablet 2  . benzonatate (TESSALON) 100 MG capsule Take 1 capsule (100 mg total) by mouth 3 (three) times daily as needed for up to 30 days for cough. 60 capsule 3  . Cholecalciferol (VITAMIN D PO) Take 1 capsule by mouth at bedtime.    Marland Kitchen dexamethasone (DECADRON) 4 MG tablet Take 2 tablets by mouth once a day on the day after chemotherapy and then take 2 tablets two times a day for 2 days. Take with food. 30 tablet 1  . diclofenac (VOLTAREN) 75 MG EC tablet Take 75 mg by mouth daily.    Marland Kitchen diltiazem (CARDIZEM CD) 120 MG 24 hr capsule Take 1 capsule  (120 mg total) by mouth daily. 90 capsule 1  . diltiazem (CARDIZEM) 30 MG tablet Take 30 mg by mouth daily as needed (RAPID HEART RATE).    . DULoxetine (CYMBALTA) 30 MG capsule Take 1 capsule (30 mg total) by mouth daily for 7 days, THEN 2 capsules (60 mg total) daily for 30 days. 67 capsule 5  . HYDROcodone-acetaminophen (NORCO) 7.5-325 MG tablet Take 1 tablet by mouth every 6 (six) hours as needed for moderate pain. 12 tablet 0  . ibuprofen (ADVIL,MOTRIN) 200 MG tablet Take 400 mg by mouth daily as needed for moderate pain.    Marland Kitchen LORazepam (ATIVAN) 0.5 MG tablet Take 1 tablet (0.5 mg total) by mouth every 6 (six) hours as needed (Nausea or vomiting). 30 tablet 0  . metoCLOPramide (REGLAN) 10 MG  tablet Take 1 tablet (10 mg total) by mouth 3 (three) times daily before meals for 14 days. 42 tablet 0  . metoprolol succinate (TOPROL XL) 100 MG 24 hr tablet Take 1 tablet (100 mg total) by mouth 2 (two) times daily. Take with or immediately following a meal. 180 tablet 3  . nitroGLYCERIN (NITROSTAT) 0.3 MG SL tablet Place 1 tablet (0.3 mg total) under the tongue every 5 (five) minutes as needed for chest pain. 30 tablet 0  . potassium chloride SA (K-DUR,KLOR-CON) 20 MEQ tablet Take 1 tablet (20 mEq total) by mouth 2 (two) times daily. 180 tablet 0  . prochlorperazine (COMPAZINE) 10 MG tablet Take 1 tablet (10 mg total) by mouth every 6 (six) hours as needed (Nausea or vomiting). 30 tablet 1  . ranitidine (ZANTAC) 150 MG tablet Take 150 mg by mouth daily as needed for heartburn.    . vitamin B-12 (CYANOCOBALAMIN) 100 MCG tablet Take 100 mcg by mouth daily.     No current facility-administered medications for this visit.     PHYSICAL EXAMINATION: ECOG PERFORMANCE STATUS: 1 - Symptomatic but completely ambulatory  Today's Vitals   03/17/18 0945  PainSc: 0-No pain   Wt Readings from Last 3 Encounters:  03/12/18 236 lb 12 oz (107.4 kg)  03/10/18 240 lb (108.9 kg)  03/10/18 240 lb 0.6 oz (108.9 kg)    Temp Readings from Last 3 Encounters:  03/12/18 97.6 F (36.4 C) (Oral)  03/10/18 97.7 F (36.5 C) (Oral)  03/04/18 97.8 F (36.6 C)   BP Readings from Last 3 Encounters:  03/12/18 120/66  03/10/18 124/64  03/10/18 139/77   Pulse Readings from Last 3 Encounters:  03/12/18 66  03/10/18 65  03/10/18 64    GENERAL: alert, no distress and comfortable SKIN: skin color, texture, turgor are normal, no rashes or significant lesions EYES: conjunctiva are pink and non-injected, sclera clear OROPHARYNX: no exudate, no erythema; lips, buccal mucosa, and tongue normal  NECK: supple, non-tender LYMPH:  no palpable lymphadenopathy in the cervical LUNGS: clear to auscultation with normal breathing effort HEART: regular rate & rhythm and no murmurs and no lower extremity edema ABDOMEN: soft, non-tender, non-distended, normal bowel sounds Musculoskeletal: no cyanosis of digits and no clubbing  PSYCH: alert & oriented x 3, fluent speech NEURO: no focal motor/sensory deficits  LABORATORY DATA:  I have reviewed the data as listed    Component Value Date/Time   NA 132 (L) 03/17/2018 0915   NA 141 08/29/2017 1155   K 3.4 (L) 03/17/2018 0915   CL 96 (L) 03/17/2018 0915   CO2 26 03/17/2018 0915   GLUCOSE 143 (H) 03/17/2018 0915   BUN 13 03/17/2018 0915   BUN 16 08/29/2017 1155   CREATININE 0.71 03/17/2018 0915   CREATININE 0.83 09/27/2014 0001   CALCIUM 9.2 03/17/2018 0915   PROT 6.5 03/17/2018 0915   PROT 7.4 08/29/2017 1155   ALBUMIN 3.7 03/17/2018 0915   ALBUMIN 4.1 08/29/2017 1155   AST 17 03/17/2018 0915   ALT 27 03/17/2018 0915   ALKPHOS 90 03/17/2018 0915   BILITOT 0.6 03/17/2018 0915   GFRNONAA >60 03/17/2018 0915   GFRAA >60 03/17/2018 0915    No results found for: SPEP, UPEP  Lab Results  Component Value Date   WBC 2.7 (L) 03/17/2018   NEUTROABS 0.6 (L) 03/17/2018   HGB 9.8 (L) 03/17/2018   HCT 30.4 (L) 03/17/2018   MCV 84.7 03/17/2018   PLT 253 03/17/2018  Chemistry      Component Value Date/Time   NA 132 (L) 03/17/2018 0915   NA 141 08/29/2017 1155   K 3.4 (L) 03/17/2018 0915   CL 96 (L) 03/17/2018 0915   CO2 26 03/17/2018 0915   BUN 13 03/17/2018 0915   BUN 16 08/29/2017 1155   CREATININE 0.71 03/17/2018 0915   CREATININE 0.83 09/27/2014 0001      Component Value Date/Time   CALCIUM 9.2 03/17/2018 0915   ALKPHOS 90 03/17/2018 0915   AST 17 03/17/2018 0915   ALT 27 03/17/2018 0915   BILITOT 0.6 03/17/2018 0915

## 2018-03-18 ENCOUNTER — Inpatient Hospital Stay: Payer: Medicare Other

## 2018-03-18 VITALS — BP 155/81 | HR 65 | Temp 97.7°F | Resp 18

## 2018-03-18 DIAGNOSIS — D6481 Anemia due to antineoplastic chemotherapy: Secondary | ICD-10-CM | POA: Diagnosis not present

## 2018-03-18 DIAGNOSIS — T451X5A Adverse effect of antineoplastic and immunosuppressive drugs, initial encounter: Secondary | ICD-10-CM | POA: Diagnosis not present

## 2018-03-18 DIAGNOSIS — C8198 Hodgkin lymphoma, unspecified, lymph nodes of multiple sites: Secondary | ICD-10-CM | POA: Diagnosis not present

## 2018-03-18 DIAGNOSIS — D701 Agranulocytosis secondary to cancer chemotherapy: Secondary | ICD-10-CM | POA: Diagnosis not present

## 2018-03-18 DIAGNOSIS — K047 Periapical abscess without sinus: Secondary | ICD-10-CM | POA: Diagnosis not present

## 2018-03-18 DIAGNOSIS — C8190 Hodgkin lymphoma, unspecified, unspecified site: Secondary | ICD-10-CM

## 2018-03-18 DIAGNOSIS — Z5111 Encounter for antineoplastic chemotherapy: Secondary | ICD-10-CM | POA: Diagnosis not present

## 2018-03-18 MED ORDER — SODIUM CHLORIDE 0.9 % IV SOLN
370.0000 mg/m2 | Freq: Once | INTRAVENOUS | Status: AC
  Administered 2018-03-18: 900 mg via INTRAVENOUS
  Filled 2018-03-18: qty 90

## 2018-03-18 MED ORDER — SODIUM CHLORIDE 0.9 % IV SOLN
Freq: Once | INTRAVENOUS | Status: AC
  Administered 2018-03-18: 10:00:00 via INTRAVENOUS
  Filled 2018-03-18: qty 5

## 2018-03-18 MED ORDER — SODIUM CHLORIDE 0.9% FLUSH
10.0000 mL | INTRAVENOUS | Status: DC | PRN
  Administered 2018-03-18: 10 mL
  Filled 2018-03-18: qty 10

## 2018-03-18 MED ORDER — PALONOSETRON HCL INJECTION 0.25 MG/5ML
0.2500 mg | Freq: Once | INTRAVENOUS | Status: AC
  Administered 2018-03-18: 0.25 mg via INTRAVENOUS

## 2018-03-18 MED ORDER — SODIUM CHLORIDE 0.9 % IV SOLN
Freq: Once | INTRAVENOUS | Status: AC
  Administered 2018-03-18: 09:00:00 via INTRAVENOUS
  Filled 2018-03-18: qty 250

## 2018-03-18 MED ORDER — PALONOSETRON HCL INJECTION 0.25 MG/5ML
INTRAVENOUS | Status: AC
  Filled 2018-03-18: qty 5

## 2018-03-18 MED ORDER — DOXORUBICIN HCL CHEMO IV INJECTION 2 MG/ML
25.0000 mg/m2 | Freq: Once | INTRAVENOUS | Status: AC
  Administered 2018-03-18: 60 mg via INTRAVENOUS
  Filled 2018-03-18: qty 30

## 2018-03-18 MED ORDER — HEPARIN SOD (PORK) LOCK FLUSH 100 UNIT/ML IV SOLN
500.0000 [IU] | Freq: Once | INTRAVENOUS | Status: AC | PRN
  Administered 2018-03-18: 500 [IU]
  Filled 2018-03-18: qty 5

## 2018-03-18 MED ORDER — VINBLASTINE SULFATE CHEMO INJECTION 1 MG/ML
6.1700 mg/m2 | Freq: Once | INTRAVENOUS | Status: AC
  Administered 2018-03-18: 15 mg via INTRAVENOUS
  Filled 2018-03-18: qty 15

## 2018-03-18 NOTE — Patient Instructions (Signed)
[facility] Discharge Instructions for Patients Receiving Chemotherapy  Today you received the following chemotherapy agents Adriamycin, Velban, DTIC  To help prevent nausea and vomiting after your treatment, we encourage you to take your nausea medication    If you develop nausea and vomiting that is not controlled by your nausea medication, call the clinic.   BELOW ARE SYMPTOMS THAT SHOULD BE REPORTED IMMEDIATELY:  *FEVER GREATER THAN 100.5 F  *CHILLS WITH OR WITHOUT FEVER  NAUSEA AND VOMITING THAT IS NOT CONTROLLED WITH YOUR NAUSEA MEDICATION  *UNUSUAL SHORTNESS OF BREATH  *UNUSUAL BRUISING OR BLEEDING  TENDERNESS IN MOUTH AND THROAT WITH OR WITHOUT PRESENCE OF ULCERS  *URINARY PROBLEMS  *BOWEL PROBLEMS  UNUSUAL RASH Items with * indicate a potential emergency and should be followed up as soon as possible.  Feel free to call the clinic should you have any questions or concerns. The clinic phone number is [phone].  Please show the [facility] at check-in to the Emergency Department and triage nurse.

## 2018-03-18 NOTE — Progress Notes (Signed)
Labs noted.  Ok to proceed with Chemo per Dr. Maylon Peppers 03/17/18 note.

## 2018-03-24 ENCOUNTER — Other Ambulatory Visit: Payer: Self-pay

## 2018-03-24 ENCOUNTER — Encounter: Payer: Self-pay | Admitting: Hematology

## 2018-03-24 ENCOUNTER — Encounter: Payer: Self-pay | Admitting: *Deleted

## 2018-03-24 ENCOUNTER — Inpatient Hospital Stay (HOSPITAL_BASED_OUTPATIENT_CLINIC_OR_DEPARTMENT_OTHER): Payer: Medicare Other | Admitting: Hematology

## 2018-03-24 ENCOUNTER — Other Ambulatory Visit: Payer: Self-pay | Admitting: *Deleted

## 2018-03-24 ENCOUNTER — Telehealth: Payer: Self-pay | Admitting: Hematology

## 2018-03-24 ENCOUNTER — Inpatient Hospital Stay: Payer: Medicare Other

## 2018-03-24 VITALS — BP 115/68 | HR 63 | Temp 97.8°F | Resp 18

## 2018-03-24 DIAGNOSIS — C8190 Hodgkin lymphoma, unspecified, unspecified site: Secondary | ICD-10-CM

## 2018-03-24 DIAGNOSIS — C8198 Hodgkin lymphoma, unspecified, lymph nodes of multiple sites: Secondary | ICD-10-CM

## 2018-03-24 DIAGNOSIS — F329 Major depressive disorder, single episode, unspecified: Secondary | ICD-10-CM

## 2018-03-24 DIAGNOSIS — I471 Supraventricular tachycardia: Secondary | ICD-10-CM

## 2018-03-24 DIAGNOSIS — E871 Hypo-osmolality and hyponatremia: Secondary | ICD-10-CM | POA: Diagnosis not present

## 2018-03-24 DIAGNOSIS — E46 Unspecified protein-calorie malnutrition: Secondary | ICD-10-CM | POA: Diagnosis not present

## 2018-03-24 DIAGNOSIS — T451X5A Adverse effect of antineoplastic and immunosuppressive drugs, initial encounter: Secondary | ICD-10-CM

## 2018-03-24 DIAGNOSIS — Z5111 Encounter for antineoplastic chemotherapy: Secondary | ICD-10-CM | POA: Diagnosis not present

## 2018-03-24 DIAGNOSIS — Z7982 Long term (current) use of aspirin: Secondary | ICD-10-CM

## 2018-03-24 DIAGNOSIS — I700 Atherosclerosis of aorta: Secondary | ICD-10-CM

## 2018-03-24 DIAGNOSIS — F32A Depression, unspecified: Secondary | ICD-10-CM

## 2018-03-24 DIAGNOSIS — D701 Agranulocytosis secondary to cancer chemotherapy: Secondary | ICD-10-CM | POA: Diagnosis not present

## 2018-03-24 DIAGNOSIS — K047 Periapical abscess without sinus: Secondary | ICD-10-CM

## 2018-03-24 DIAGNOSIS — Z79899 Other long term (current) drug therapy: Secondary | ICD-10-CM

## 2018-03-24 DIAGNOSIS — D6481 Anemia due to antineoplastic chemotherapy: Secondary | ICD-10-CM

## 2018-03-24 DIAGNOSIS — K5900 Constipation, unspecified: Secondary | ICD-10-CM

## 2018-03-24 LAB — CMP (CANCER CENTER ONLY)
ALT: 48 U/L — AB (ref 0–44)
AST: 21 U/L (ref 15–41)
Albumin: 3.2 g/dL — ABNORMAL LOW (ref 3.5–5.0)
Alkaline Phosphatase: 54 U/L (ref 38–126)
Anion gap: 7 (ref 5–15)
BUN: 23 mg/dL (ref 8–23)
CO2: 28 mmol/L (ref 22–32)
Calcium: 8.1 mg/dL — ABNORMAL LOW (ref 8.9–10.3)
Chloride: 99 mmol/L (ref 98–111)
Creatinine: 0.68 mg/dL (ref 0.61–1.24)
GFR, Est AFR Am: >60 mL/min (ref >60)
GFR, Estimated: >60 mL/min (ref >60)
Glucose, Bld: 160 mg/dL — ABNORMAL HIGH (ref 70–99)
Potassium: 3.6 mmol/L (ref 3.5–5.1)
Sodium: 134 mmol/L — ABNORMAL LOW (ref 135–145)
Total Bilirubin: 0.5 mg/dL (ref 0.3–1.2)
Total Protein: 5.2 g/dL — ABNORMAL LOW (ref 6.5–8.1)

## 2018-03-24 LAB — CBC WITH DIFFERENTIAL (CANCER CENTER ONLY)
Abs Immature Granulocytes: 0.09 10*3/uL — ABNORMAL HIGH (ref 0.00–0.07)
Basophils Absolute: 0 10*3/uL (ref 0.0–0.1)
Basophils Relative: 1 %
Eosinophils Absolute: 0 10*3/uL (ref 0.0–0.5)
Eosinophils Relative: 1 %
HCT: 26.9 % — ABNORMAL LOW (ref 39.0–52.0)
Hemoglobin: 8.7 g/dL — ABNORMAL LOW (ref 13.0–17.0)
Immature Granulocytes: 3 %
Lymphocytes Relative: 21 %
Lymphs Abs: 0.7 10*3/uL (ref 0.7–4.0)
MCH: 27.1 pg (ref 26.0–34.0)
MCHC: 32.3 g/dL (ref 30.0–36.0)
MCV: 83.8 fL (ref 80.0–100.0)
MONO ABS: 0 10*3/uL — AB (ref 0.1–1.0)
Monocytes Relative: 1 %
Neutro Abs: 2.2 10*3/uL (ref 1.7–7.7)
Neutrophils Relative %: 73 %
Platelet Count: 167 10*3/uL (ref 150–400)
RBC: 3.21 MIL/uL — ABNORMAL LOW (ref 4.22–5.81)
RDW: 15.9 % — ABNORMAL HIGH (ref 11.5–15.5)
WBC Count: 3 10*3/uL — ABNORMAL LOW (ref 4.0–10.5)
nRBC: 0 % (ref 0.0–0.2)

## 2018-03-24 LAB — MAGNESIUM: Magnesium: 1.7 mg/dL (ref 1.7–2.4)

## 2018-03-24 MED ORDER — DEXAMETHASONE 4 MG PO TABS: ORAL_TABLET | ORAL | 1 refills | Status: DC

## 2018-03-24 MED ORDER — SODIUM CHLORIDE 0.9 % IV SOLN
Freq: Once | INTRAVENOUS | Status: AC
  Administered 2018-03-24: 10:00:00 via INTRAVENOUS
  Filled 2018-03-24: qty 250

## 2018-03-24 MED ORDER — HEPARIN SOD (PORK) LOCK FLUSH 100 UNIT/ML IV SOLN
500.0000 [IU] | Freq: Once | INTRAVENOUS | Status: DC | PRN
  Filled 2018-03-24: qty 5

## 2018-03-24 MED ORDER — SODIUM CHLORIDE 0.9% FLUSH
10.0000 mL | Freq: Once | INTRAVENOUS | Status: DC | PRN
  Filled 2018-03-24: qty 10

## 2018-03-24 NOTE — Telephone Encounter (Signed)
No change in appts 3/9 los

## 2018-03-24 NOTE — Progress Notes (Signed)
[place] OFFICE PROGRESS NOTE  Patient Care Team: Tysinger, Camelia Eng, PA-C as PCP - General (Family Medicine) Nahser, Wonda Cheng, MD as PCP - Cardiology (Cardiology) Constance Haw, MD as PCP - Electrophysiology (Cardiology)  HEME/ONC OVERVIEW: 1. Stage III classic Hodgkin lymphoma, subtype NOS due to limited specimen -Bx-proven R cervical LN involvement in 09/2017; PET in 11/2017 showed cervical, mediastinal and upper abdominal LN involement; treatment delayed due to pt traveling on a cruise after diagnosis -11/2017 - present: ABVD -12/2017: PET after 2 cycles of ABVD showed interval CR  -Chemotherapy delayed due to SVT requiring ablation in 01/2018 -Mid-01/2018 - present: AVD   2. Port placement in 11/2017   CHEMOTHERAPY REGIMEN:  11/26/2017 - 01/07/2018: ABVD x 2 cycles, CR  02/04/2018 - present: AVD, plan for 3 cycles due to patient's preference (instead of 4)  ASSESSMENT & PLAN:   Stage III classic Hodgkin lymphoma, favorable risk  -S/p C4D15 AVD on 03/18/2018; C5D1 on 04/01/2018 -IV fluid supportive during the 2nd week of each day chemotherapy treatment due to poor p.o. intake -Patient and I had multiple discussions regarding the total number of AVD treatment; he understands that the standard-of-care recommendation is for 4 cycles of AVD, but patient is very concerned about his tolerance; after lengthy discussions, he chose to receive a total of 3 cycles of AVD, understanding that there is a slight increased risk of disease recurrence  -We will plan to obtain PET in 3 to 4 weeks after the last chemotherapy treatment to assess response -PRN anti-emetics: Zofran, Compazine, dexamethasone, and Ativan  Chemotherapy-associated neutropenia -Secondary to chemotherapy -WBC 3.0k with ANC 2200, slightly improving -Patient denies any symptoms of infection -Patient is asymptomatic from neutropenia -In the absence of infection, neutropenia alone would not delay  treatment for Hodgkin lymphoma -We will monitor for now; no indication for dose adjustment  Chemotherapy-associated anemia -Secondary to chemotherapy -Hgb 8.7, slightly worse than last week -Patient reports mild fatigue but denies any symptom of bleeding -We will monitor for now; no indication for dose adjustment  Hyponatremia -Secondary to decreased PO intake -Na 134, slightly improving -IV fluid support as outlined above -I counseled patient on modest increase in his daily salt intake (1-2 teaspoon of salt 2x/day)  Malnutrition -Weight relatively stable over the past 2-3 weeks  -He is taking 4-6 cans/day of nutritional supplements when he is not able to maintain adequate PO intake -I encouraged patient to adhere recommendations from the nutritionist   Depression -On Cymbalta 60 mg daily since 02/2018 -Patient mood is overall slowly improving, no SI/HI -Continue with Cymbalta; patient understands that if he develops worsening mood, SI, or HI, he should contact the clinic promptly  Dental abscess -Patient has one early dental abscess, monitored by his dentist closely -I discussed the case at length with his dentist, who felt that the abscess is relatively small and can be treated with suppressive antibiotics until he completes his chemotherapy -Patient has PRN amoxicillin that he takes when he develops dental ache -I encouraged patient to follow up with his dentist for further management  All questions were answered. The patient knows to call the clinic with any problems, questions or concerns. No barriers to learning was detected.  Return in 1 week for labs, port flush, and clinic appt prior to C5D1 of AVD.   [name], MD 03/24/2018 10:51 AM  CHIEF COMPLAINT: "I am just tired"  INTERVAL HISTORY: [name] returns to clinic for toxicity check after C4D15 of AVD.  Patient reports that he has slightly worsening fatigue since the last chemotherapy, which he attributes to  cumulative effect of chemotherapy.  His appetite has been limited, and he has been supplementing with nutritional supplements.  He has mild intermittent constipation, for which he has been taking PRN laxatives.  He has occasional tooth ache, for which his dentist as prescribed amoxicillin to take on a as needed basis until he completes chemotherapy.  His mood is slightly improving since starting Cymbalta.  He denies any fever, cough, sputum production, chest pain, dyspnea, recurrent palpitation, abdominal pain, nausea, vomiting, or diarrhea.  SUMMARY OF ONCOLOGIC HISTORY:   Hodgkin lymphoma ([place])   08/29/2017 Initial Diagnosis    Hodgkin lymphoma ([place])    10/07/2017 Imaging    CT neck w/ contrast: Enlarged nodes in the right jugular chain, posterior triangle, and supraclavicular fossa. The pattern is most concerning for lymphoma; multiple nodes are superficial and amenable to biopsy.  CT CAP w/ contrast: 1. Right supraclavicular and subcarinal adenopathy is identified. Differential considerations include lymphoproliferative disorder, metastatic adenopathy, granulomatous inflammation/infection or reactive adenopathy. Consider further evaluation with tissue sampling. 2. No acute findings, mass or adenopathy within the abdomen. 3. Lungs are clear.  No evidence for pneumonia.  4.  Aortic Atherosclerosis (ICD10-I70.0).    10/25/2017 Procedure    US-guided R cervical LN biopsy    10/25/2017 Pathology Results    (Accession: [id number]) Lymph node, needle/core biopsy, Right Cervical - CLASSIC HODGKIN LYMPHOMA - SEE COMMENT - CD30 (1%) - Unable to subtype due to limited sample specimen    11/12/2017 Cancer Staging    Staging form: Hodgkin and Non-Hodgkin Lymphoma, AJCC 8th Edition - Clinical stage from 11/12/2017: Stage II (Hodgkin lymphoma, A - Asymptomatic) - Signed by [name], MD on 11/12/2017    11/21/2017 -  Chemotherapy    The patient had DOXOrubicin (ADRIAMYCIN) chemo injection 60 mg,  25 mg/m2 = 60 mg, Intravenous,  Once, 0 of 4 cycles palonosetron (ALOXI) injection 0.25 mg, 0.25 mg, Intravenous,  Once, 0 of 4 cycles bleomycin (BLEOCIN) 24 Units in sodium chloride 0.9 % 50 mL chemo infusion, 10 Units/m2 = 24 Units, Intravenous,  Once, 0 of 4 cycles dacarbazine (DTIC) 910 mg in sodium chloride 0.9 % 250 mL chemo infusion, 375 mg/m2 = 910 mg, Intravenous,  Once, 0 of 4 cycles vinBLAStine (VELBAN) 14.6 mg in sodium chloride 0.9 % 50 mL chemo infusion, 6 mg/m2 = 14.6 mg, Intravenous, Once, 0 of 4 cycles fosaprepitant (EMEND) 150 mg, dexamethasone (DECADRON) 12 mg in sodium chloride 0.9 % 145 mL IVPB, , Intravenous,  Once, 0 of 4 cycles  for chemotherapy treatment.     11/21/2017 Imaging    PET: IMPRESSION: 1. Multiple relatively small hypermetabolic lymph nodes in the RIGHT neck, mediastinum and limited involvement of the upper abdomen. Lymph node activity is high (greater than liver, [place] 4). 2. The most intense enlarged lymph node is anterior to the sternocleidomastoid muscle on the RIGHT. There are multiple assessable lymph nodes in the RIGHT cervical chain. 3. Minimal hypermetabolic adenopathy in the upper abdomen. No pelvic or inguinal metastatic adenopathy. 4. Normal spleen and bone marrow. 5. Single focus of metabolic activity in the sigmoid colon. Recommend screening colonoscopy if patient is not current for such.    01/10/2018 Imaging    PET (after 2 cycles of ABVD) IMPRESSION: 1. Interval complete response to therapy. No abnormal areas of persistent hypermetabolic adenopathy identified. 2.  Aortic Atherosclerosis (ICD10-I70.0). 3. Multi vessel coronary artery atherosclerotic calcifications.  REVIEW OF SYSTEMS:   Constitutional: ( - ) fevers, ( - )  chills , ( - ) night sweats Eyes: ( - ) blurriness of vision, ( - ) double vision, ( - ) watery eyes Ears, nose, mouth, throat, and face: ( - ) mucositis, ( - ) sore throat Respiratory: ( - ) cough, ( - )  dyspnea, ( - ) wheezes Cardiovascular: ( - ) palpitation, ( - ) chest discomfort, ( - ) lower extremity swelling Gastrointestinal:  ( - ) nausea, ( - ) heartburn, ( + ) change in bowel habits Skin: ( - ) abnormal skin rashes Lymphatics: ( - ) new lymphadenopathy, ( - ) easy bruising Neurological: ( - ) numbness, ( - ) tingling, ( - ) new weaknesses Behavioral/Psych: ( - ) mood change, ( - ) new changes  All other systems were reviewed with the patient and are negative.  I have reviewed the past medical history, past surgical history, social history and family history with the patient and they are unchanged from previous note.  ALLERGIES:  has No Known Allergies.  MEDICATIONS:  Current Outpatient Medications  Medication Sig Dispense Refill  . amoxicillin (AMOXIL) 500 MG capsule Take 500 mg by mouth 2 (two) times daily.    Marland Kitchen aspirin EC 81 MG tablet Take 1 tablet (81 mg total) by mouth daily. 90 tablet 3  . atorvastatin (LIPITOR) 20 MG tablet TAKE 1 TABLET BY MOUTH DAILY (Patient taking differently: Take 20 mg by mouth daily. ) 90 tablet 2  . Cholecalciferol (VITAMIN D PO) Take 1 capsule by mouth at bedtime.    Marland Kitchen dexamethasone (DECADRON) 4 MG tablet Take 2 tablets by mouth once a day on the day after chemotherapy and then take 2 tablets two times a day for 2 days. Take with food. 30 tablet 1  . diclofenac (VOLTAREN) 75 MG EC tablet Take 75 mg by mouth daily.    Marland Kitchen diltiazem (CARDIZEM CD) 120 MG 24 hr capsule Take 1 capsule (120 mg total) by mouth daily. 90 capsule 1  . diltiazem (CARDIZEM) 30 MG tablet Take 30 mg by mouth daily as needed (RAPID HEART RATE).    . DULoxetine (CYMBALTA) 30 MG capsule Take 1 capsule (30 mg total) by mouth daily for 7 days, THEN 2 capsules (60 mg total) daily for 30 days. 67 capsule 5  . HYDROcodone-acetaminophen (NORCO) 7.5-325 MG tablet Take 1 tablet by mouth every 6 (six) hours as needed for moderate pain. 12 tablet 0  . ibuprofen (ADVIL,MOTRIN) 200 MG tablet  Take 400 mg by mouth daily as needed for moderate pain.    Marland Kitchen LORazepam (ATIVAN) 0.5 MG tablet Take 1 tablet (0.5 mg total) by mouth every 6 (six) hours as needed (Nausea or vomiting). 30 tablet 0  . metoCLOPramide (REGLAN) 10 MG tablet Take 1 tablet (10 mg total) by mouth 3 (three) times daily before meals for 14 days. 42 tablet 0  . metoprolol succinate (TOPROL XL) 100 MG 24 hr tablet Take 1 tablet (100 mg total) by mouth 2 (two) times daily. Take with or immediately following a meal. 180 tablet 3  . nitroGLYCERIN (NITROSTAT) 0.3 MG SL tablet Place 1 tablet (0.3 mg total) under the tongue every 5 (five) minutes as needed for chest pain. 30 tablet 0  . potassium chloride SA (K-DUR,KLOR-CON) 20 MEQ tablet Take 1 tablet (20 mEq total) by mouth 2 (two) times daily. 180 tablet 0  . prochlorperazine (COMPAZINE) 10 MG tablet Take 1 tablet (  10 mg total) by mouth every 6 (six) hours as needed (Nausea or vomiting). 30 tablet 1  . ranitidine (ZANTAC) 150 MG tablet Take 150 mg by mouth daily as needed for heartburn.    . vitamin B-12 (CYANOCOBALAMIN) 100 MCG tablet Take 100 mcg by mouth daily.     No current facility-administered medications for this visit.    Facility-Administered Medications Ordered in Other Visits  Medication Dose Route Frequency Provider Last Rate Last Dose  . heparin lock flush 100 unit/mL  500 Units Intracatheter Once PRN [name], MD      . sodium chloride flush (NS) 0.9 % injection 10 mL  10 mL Intracatheter Once PRN [name], MD        PHYSICAL EXAMINATION: ECOG PERFORMANCE STATUS: 1 - Symptomatic but completely ambulatory  Today's Vitals   03/24/18 1048  BP: 115/68  Pulse: 63  Resp: 18  Temp: 97.8 F (36.6 C)  SpO2: 100%  PainSc: 0-No pain   There is no height or weight on file to calculate BMI.  There were no vitals filed for this visit.  GENERAL: alert, no distress and comfortable SKIN: skin color, texture, turgor are normal, no rashes or significant  lesions EYES: conjunctiva are pink and non-injected, sclera clear OROPHARYNX: no exudate, no erythema; lips, buccal mucosa, and tongue normal  NECK: supple, non-tender LYMPH:  no palpable lymphadenopathy in the cervical LUNGS: clear to auscultation with normal breathing effort HEART: regular rate & rhythm and no murmurs and no lower extremity edema ABDOMEN: soft, non-tender, non-distended, normal bowel sounds Musculoskeletal: no cyanosis of digits and no clubbing  PSYCH: alert & oriented x 3, fluent speech NEURO: no focal motor/sensory deficits  LABORATORY DATA:  I have reviewed the data as listed    Component Value Date/Time   NA 132 (L) 03/17/2018 0915   NA 141 08/29/2017 1155   K 3.4 (L) 03/17/2018 0915   CL 96 (L) 03/17/2018 0915   CO2 26 03/17/2018 0915   GLUCOSE 143 (H) 03/17/2018 0915   BUN 13 03/17/2018 0915   BUN 16 08/29/2017 1155   CREATININE 0.71 03/17/2018 0915   CREATININE 0.83 09/27/2014 0001   CALCIUM 9.2 03/17/2018 0915   PROT 6.5 03/17/2018 0915   PROT 7.4 08/29/2017 1155   ALBUMIN 3.7 03/17/2018 0915   ALBUMIN 4.1 08/29/2017 1155   AST 17 03/17/2018 0915   ALT 27 03/17/2018 0915   ALKPHOS 90 03/17/2018 0915   BILITOT 0.6 03/17/2018 0915   GFRNONAA >60 03/17/2018 0915   GFRAA >60 03/17/2018 0915    No results found for: SPEP, UPEP  Lab Results  Component Value Date   WBC 2.7 (L) 03/17/2018   NEUTROABS 0.6 (L) 03/17/2018   HGB 9.8 (L) 03/17/2018   HCT 30.4 (L) 03/17/2018   MCV 84.7 03/17/2018   PLT 253 03/17/2018      Chemistry      Component Value Date/Time   NA 132 (L) 03/17/2018 0915   NA 141 08/29/2017 1155   K 3.4 (L) 03/17/2018 0915   CL 96 (L) 03/17/2018 0915   CO2 26 03/17/2018 0915   BUN 13 03/17/2018 0915   BUN 16 08/29/2017 1155   CREATININE 0.71 03/17/2018 0915   CREATININE 0.83 09/27/2014 0001      Component Value Date/Time   CALCIUM 9.2 03/17/2018 0915   ALKPHOS 90 03/17/2018 0915   AST 17 03/17/2018 0915   ALT 27  03/17/2018 0915   BILITOT 0.6 03/17/2018 0915

## 2018-03-24 NOTE — Patient Instructions (Signed)
Dehydration, Adult  Dehydration is when there is not enough fluid or water in your body. This happens when you lose more fluids than you take in. Dehydration can range from mild to very bad. It should be treated right away to keep it from getting very bad. Symptoms of mild dehydration may include:  Thirst.  Dry lips.  Slightly dry mouth.  Dry, warm skin.  Dizziness. Symptoms of moderate dehydration may include:  Very dry mouth.  Muscle cramps.  Dark pee (urine). Pee may be the color of tea.  Your body making less pee.  Your eyes making fewer tears.  Heartbeat that is uneven or faster than normal (palpitations).  Headache.  Light-headedness, especially when you stand up from sitting.  Fainting (syncope). Symptoms of very bad dehydration may include:  Changes in skin, such as: ? Cold and clammy skin. ? Blotchy (mottled) or pale skin. ? Skin that does not quickly return to normal after being lightly pinched and let go (poor skin turgor).  Changes in body fluids, such as: ? Feeling very thirsty. ? Your eyes making fewer tears. ? Not sweating when body temperature is high, such as in hot weather. ? Your body making very little pee.  Changes in vital signs, such as: ? Weak pulse. ? Pulse that is more than 100 beats a minute when you are sitting still. ? Fast breathing. ? Low blood pressure.  Other changes, such as: ? Sunken eyes. ? Cold hands and feet. ? Confusion. ? Lack of energy (lethargy). ? Trouble waking up from sleep. ? Short-term weight loss. ? Unconsciousness. Follow these instructions at home:   If told by your doctor, drink an ORS: ? Make an ORS by using instructions on the package. ? Start by drinking small amounts, about  cup (120 mL) every 5-10 minutes. ? Slowly drink more until you have had the amount that your doctor said to have.  Drink enough clear fluid to keep your pee clear or pale yellow. If you were told to drink an ORS, finish the  ORS first, then start slowly drinking clear fluids. Drink fluids such as: ? Water. Do not drink only water by itself. Doing that can make the salt (sodium) level in your body get too low (hyponatremia). ? Ice chips. ? Fruit juice that you have added water to (diluted). ? Low-calorie sports drinks.  Avoid: ? Alcohol. ? Drinks that have a lot of sugar. These include high-calorie sports drinks, fruit juice that does not have water added, and soda. ? Caffeine. ? Foods that are greasy or have a lot of fat or sugar.  Take over-the-counter and prescription medicines only as told by your doctor.  Do not take salt tablets. Doing that can make the salt level in your body get too high (hypernatremia).  Eat foods that have minerals (electrolytes). Examples include bananas, oranges, potatoes, tomatoes, and spinach.  Keep all follow-up visits as told by your doctor. This is important. Contact a doctor if:  You have belly (abdominal) pain that: ? Gets worse. ? Stays in one area (localizes).  You have a rash.  You have a stiff neck.  You get angry or annoyed more easily than normal (irritability).  You are more sleepy than normal.  You have a harder time waking up than normal.  You feel: ? Weak. ? Dizzy. ? Very thirsty.  You have peed (urinated) only a small amount of very dark pee during 6-8 hours. Get help right away if:  You have   symptoms of very bad dehydration.  You cannot drink fluids without throwing up (vomiting).  Your symptoms get worse with treatment.  You have a fever.  You have a very bad headache.  You are throwing up or having watery poop (diarrhea) and it: ? Gets worse. ? Does not go away.  You have blood or something green (bile) in your throw-up.  You have blood in your poop (stool). This may cause poop to look black and tarry.  You have not peed in 6-8 hours.  You pass out (faint).  Your heart rate when you are sitting still is more than 100 beats a  minute.  You have trouble breathing. This information is not intended to replace advice given to you by your health care provider. Make sure you discuss any questions you have with your health care provider. Document Released: 10/28/2008 Document Revised: 07/22/2015 Document Reviewed: 02/25/2015 Elsevier Interactive Patient Education  2019 [employer]  

## 2018-03-25 ENCOUNTER — Ambulatory Visit: Payer: Medicare Other | Admitting: Hematology

## 2018-03-25 ENCOUNTER — Ambulatory Visit: Payer: Medicare Other

## 2018-03-26 ENCOUNTER — Other Ambulatory Visit: Payer: Self-pay

## 2018-03-26 ENCOUNTER — Inpatient Hospital Stay: Payer: Medicare Other

## 2018-03-26 VITALS — BP 142/81 | HR 63 | Temp 98.0°F | Resp 18

## 2018-03-26 DIAGNOSIS — K047 Periapical abscess without sinus: Secondary | ICD-10-CM | POA: Diagnosis not present

## 2018-03-26 DIAGNOSIS — C8190 Hodgkin lymphoma, unspecified, unspecified site: Secondary | ICD-10-CM

## 2018-03-26 DIAGNOSIS — T451X5A Adverse effect of antineoplastic and immunosuppressive drugs, initial encounter: Secondary | ICD-10-CM | POA: Diagnosis not present

## 2018-03-26 DIAGNOSIS — D6481 Anemia due to antineoplastic chemotherapy: Secondary | ICD-10-CM | POA: Diagnosis not present

## 2018-03-26 DIAGNOSIS — Z5111 Encounter for antineoplastic chemotherapy: Secondary | ICD-10-CM | POA: Diagnosis not present

## 2018-03-26 DIAGNOSIS — C8198 Hodgkin lymphoma, unspecified, lymph nodes of multiple sites: Secondary | ICD-10-CM | POA: Diagnosis not present

## 2018-03-26 DIAGNOSIS — D701 Agranulocytosis secondary to cancer chemotherapy: Secondary | ICD-10-CM | POA: Diagnosis not present

## 2018-03-26 MED ORDER — SODIUM CHLORIDE 0.9 % IV SOLN
Freq: Once | INTRAVENOUS | Status: AC
  Administered 2018-03-26: 10:00:00 via INTRAVENOUS
  Filled 2018-03-26: qty 250

## 2018-03-26 MED ORDER — HEPARIN SOD (PORK) LOCK FLUSH 100 UNIT/ML IV SOLN
500.0000 [IU] | Freq: Once | INTRAVENOUS | Status: AC | PRN
  Administered 2018-03-26: 500 [IU]
  Filled 2018-03-26: qty 5

## 2018-03-26 MED ORDER — SODIUM CHLORIDE 0.9% FLUSH
10.0000 mL | Freq: Once | INTRAVENOUS | Status: AC | PRN
  Administered 2018-03-26: 10 mL
  Filled 2018-03-26: qty 10

## 2018-03-26 NOTE — Patient Instructions (Signed)
Dehydration, Adult  Dehydration is when there is not enough fluid or water in your body. This happens when you lose more fluids than you take in. Dehydration can range from mild to very bad. It should be treated right away to keep it from getting very bad. Symptoms of mild dehydration may include:  Thirst.  Dry lips.  Slightly dry mouth.  Dry, warm skin.  Dizziness. Symptoms of moderate dehydration may include:  Very dry mouth.  Muscle cramps.  Dark pee (urine). Pee may be the color of tea.  Your body making less pee.  Your eyes making fewer tears.  Heartbeat that is uneven or faster than normal (palpitations).  Headache.  Light-headedness, especially when you stand up from sitting.  Fainting (syncope). Symptoms of very bad dehydration may include:  Changes in skin, such as: ? Cold and clammy skin. ? Blotchy (mottled) or pale skin. ? Skin that does not quickly return to normal after being lightly pinched and let go (poor skin turgor).  Changes in body fluids, such as: ? Feeling very thirsty. ? Your eyes making fewer tears. ? Not sweating when body temperature is high, such as in hot weather. ? Your body making very little pee.  Changes in vital signs, such as: ? Weak pulse. ? Pulse that is more than 100 beats a minute when you are sitting still. ? Fast breathing. ? Low blood pressure.  Other changes, such as: ? Sunken eyes. ? Cold hands and feet. ? Confusion. ? Lack of energy (lethargy). ? Trouble waking up from sleep. ? Short-term weight loss. ? Unconsciousness. Follow these instructions at home:   If told by your doctor, drink an ORS: ? Make an ORS by using instructions on the package. ? Start by drinking small amounts, about  cup (120 mL) every 5-10 minutes. ? Slowly drink more until you have had the amount that your doctor said to have.  Drink enough clear fluid to keep your pee clear or pale yellow. If you were told to drink an ORS, finish the  ORS first, then start slowly drinking clear fluids. Drink fluids such as: ? Water. Do not drink only water by itself. Doing that can make the salt (sodium) level in your body get too low (hyponatremia). ? Ice chips. ? Fruit juice that you have added water to (diluted). ? Low-calorie sports drinks.  Avoid: ? Alcohol. ? Drinks that have a lot of sugar. These include high-calorie sports drinks, fruit juice that does not have water added, and soda. ? Caffeine. ? Foods that are greasy or have a lot of fat or sugar.  Take over-the-counter and prescription medicines only as told by your doctor.  Do not take salt tablets. Doing that can make the salt level in your body get too high (hypernatremia).  Eat foods that have minerals (electrolytes). Examples include bananas, oranges, potatoes, tomatoes, and spinach.  Keep all follow-up visits as told by your doctor. This is important. Contact a doctor if:  You have belly (abdominal) pain that: ? Gets worse. ? Stays in one area (localizes).  You have a rash.  You have a stiff neck.  You get angry or annoyed more easily than normal (irritability).  You are more sleepy than normal.  You have a harder time waking up than normal.  You feel: ? Weak. ? Dizzy. ? Very thirsty.  You have peed (urinated) only a small amount of very dark pee during 6-8 hours. Get help right away if:  You have   symptoms of very bad dehydration.  You cannot drink fluids without throwing up (vomiting).  Your symptoms get worse with treatment.  You have a fever.  You have a very bad headache.  You are throwing up or having watery poop (diarrhea) and it: ? Gets worse. ? Does not go away.  You have blood or something green (bile) in your throw-up.  You have blood in your poop (stool). This may cause poop to look black and tarry.  You have not peed in 6-8 hours.  You pass out (faint).  Your heart rate when you are sitting still is more than 100 beats a  minute.  You have trouble breathing. This information is not intended to replace advice given to you by your health care provider. Make sure you discuss any questions you have with your health care provider. Document Released: 10/28/2008 Document Revised: 07/22/2015 Document Reviewed: 02/25/2015 Elsevier Interactive Patient Education  2019 [employer]  

## 2018-03-27 ENCOUNTER — Ambulatory Visit: Payer: Medicare Other

## 2018-03-28 ENCOUNTER — Inpatient Hospital Stay: Payer: Medicare Other

## 2018-03-28 ENCOUNTER — Other Ambulatory Visit: Payer: Self-pay

## 2018-03-28 ENCOUNTER — Other Ambulatory Visit: Payer: Self-pay | Admitting: Oncology

## 2018-03-28 VITALS — BP 160/81 | HR 66 | Temp 98.1°F | Resp 18

## 2018-03-28 DIAGNOSIS — D6481 Anemia due to antineoplastic chemotherapy: Secondary | ICD-10-CM | POA: Diagnosis not present

## 2018-03-28 DIAGNOSIS — K047 Periapical abscess without sinus: Secondary | ICD-10-CM | POA: Diagnosis not present

## 2018-03-28 DIAGNOSIS — C8190 Hodgkin lymphoma, unspecified, unspecified site: Secondary | ICD-10-CM

## 2018-03-28 DIAGNOSIS — D701 Agranulocytosis secondary to cancer chemotherapy: Secondary | ICD-10-CM | POA: Diagnosis not present

## 2018-03-28 DIAGNOSIS — C8198 Hodgkin lymphoma, unspecified, lymph nodes of multiple sites: Secondary | ICD-10-CM | POA: Diagnosis not present

## 2018-03-28 DIAGNOSIS — Z5111 Encounter for antineoplastic chemotherapy: Secondary | ICD-10-CM | POA: Diagnosis not present

## 2018-03-28 DIAGNOSIS — T451X5A Adverse effect of antineoplastic and immunosuppressive drugs, initial encounter: Secondary | ICD-10-CM | POA: Diagnosis not present

## 2018-03-28 MED ORDER — SODIUM CHLORIDE 0.9 % IV SOLN
Freq: Once | INTRAVENOUS | Status: AC
  Administered 2018-03-28: 10:00:00 via INTRAVENOUS
  Filled 2018-03-28: qty 250

## 2018-03-28 MED ORDER — LORAZEPAM 0.5 MG PO TABS: 0.5000 mg | ORAL_TABLET | Freq: Four times a day (QID) | ORAL | 0 refills | Status: DC | PRN

## 2018-03-28 MED ORDER — HEPARIN SOD (PORK) LOCK FLUSH 100 UNIT/ML IV SOLN
500.0000 [IU] | Freq: Once | INTRAVENOUS | Status: AC | PRN
  Administered 2018-03-28: 500 [IU]
  Filled 2018-03-28: qty 5

## 2018-03-28 MED ORDER — SODIUM CHLORIDE 0.9% FLUSH
10.0000 mL | Freq: Once | INTRAVENOUS | Status: AC | PRN
  Administered 2018-03-28: 10 mL
  Filled 2018-03-28: qty 10

## 2018-03-28 NOTE — Patient Instructions (Signed)
Dehydration, Adult  Dehydration is a condition in which there is not enough fluid or water in the body. This happens when you lose more fluids than you take in. Important organs, such as the kidneys, brain, and heart, cannot function without a proper amount of fluids. Any loss of fluids from the body can lead to dehydration. Dehydration can range from mild to severe. This condition should be treated right away to prevent it from becoming severe. What are the causes? This condition may be caused by:  Vomiting.  Diarrhea.  Excessive sweating, such as from heat exposure or exercise.  Not drinking enough fluid, especially: ? When ill. ? While doing activity that requires a lot of energy.  Excessive urination.  Fever.  Infection.  Certain medicines, such as medicines that cause the body to lose excess fluid (diuretics).  Inability to access safe drinking water.  Reduced physical ability to get adequate water and food. What increases the risk? This condition is more likely to develop in people:  Who have a poorly controlled long-term (chronic) illness, such as diabetes, heart disease, or kidney disease.  Who are age 65 or older.  Who are disabled.  Who live in a place with high altitude.  Who play endurance sports. What are the signs or symptoms? Symptoms of mild dehydration may include:  Thirst.  Dry lips.  Slightly dry mouth.  Dry, warm skin.  Dizziness. Symptoms of moderate dehydration may include:  Very dry mouth.  Muscle cramps.  Dark urine. Urine may be the color of tea.  Decreased urine production.  Decreased tear production.  Heartbeat that is irregular or faster than normal (palpitations).  Headache.  Light-headedness, especially when you stand up from a sitting position.  Fainting (syncope). Symptoms of severe dehydration may include:  Changes in skin, such as: ? Cold and clammy skin. ? Blotchy (mottled) or pale skin. ? Skin that does  not quickly return to normal after being lightly pinched and released (poor skin turgor).  Changes in body fluids, such as: ? Extreme thirst. ? No tear production. ? Inability to sweat when body temperature is high, such as in hot weather. ? Very little urine production.  Changes in vital signs, such as: ? Weak pulse. ? Pulse that is more than 100 beats a minute when sitting still. ? Rapid breathing. ? Low blood pressure.  Other changes, such as: ? Sunken eyes. ? Cold hands and feet. ? Confusion. ? Lack of energy (lethargy). ? Difficulty waking up from sleep. ? Short-term weight loss. ? Unconsciousness. How is this diagnosed? This condition is diagnosed based on your symptoms and a physical exam. Blood and urine tests may be done to help confirm the diagnosis. How is this treated? Treatment for this condition depends on the severity. Mild or moderate dehydration can often be treated at home. Treatment should be started right away. Do not wait until dehydration becomes severe. Severe dehydration is an emergency and it needs to be treated in a hospital. Treatment for mild dehydration may include:  Drinking more fluids.  Replacing salts and minerals in your blood (electrolytes) that you may have lost. Treatment for moderate dehydration may include:  Drinking an oral rehydration solution (ORS). This is a drink that helps you replace fluids and electrolytes (rehydrate). It can be found at pharmacies and retail stores. Treatment for severe dehydration may include:  Receiving fluids through an IV tube.  Receiving an electrolyte solution through a feeding tube that is passed through your nose and   into your stomach (nasogastric tube, or NG tube).  Correcting any abnormalities in electrolytes.  Treating the underlying cause of dehydration. Follow these instructions at home:  If directed by your health care provider, drink an ORS: ? Make an ORS by following instructions on the  package. ? Start by drinking small amounts, about  cup (120 mL) every 5-10 minutes. ? Slowly increase how much you drink until you have taken the amount recommended by your health care provider.  Drink enough clear fluid to keep your urine clear or pale yellow. If you were told to drink an ORS, finish the ORS first, then start slowly drinking other clear fluids. Drink fluids such as: ? Water. Do not drink only water. Doing that can lead to having too little salt (sodium) in the body (hyponatremia). ? Ice chips. ? Fruit juice that you have added water to (diluted fruit juice). ? Low-calorie sports drinks.  Avoid: ? Alcohol. ? Drinks that contain a lot of sugar. These include high-calorie sports drinks, fruit juice that is not diluted, and soda. ? Caffeine. ? Foods that are greasy or contain a lot of fat or sugar.  Take over-the-counter and prescription medicines only as told by your health care provider.  Do not take sodium tablets. This can lead to having too much sodium in the body (hypernatremia).  Eat foods that contain a healthy balance of electrolytes, such as bananas, oranges, potatoes, tomatoes, and spinach.  Keep all follow-up visits as told by your health care provider. This is important. Contact a health care provider if:  You have abdominal pain that: ? Gets worse. ? Stays in one area (localizes).  You have a rash.  You have a stiff neck.  You are more irritable than usual.  You are sleepier or more difficult to wake up than usual.  You feel weak or dizzy.  You feel very thirsty.  You have urinated only a small amount of very dark urine over 6-8 hours. Get help right away if:  You have symptoms of severe dehydration.  You cannot drink fluids without vomiting.  Your symptoms get worse with treatment.  You have a fever.  You have a severe headache.  You have vomiting or diarrhea that: ? Gets worse. ? Does not go away.  You have blood or green matter  (bile) in your vomit.  You have blood in your stool. This may cause stool to look black and tarry.  You have not urinated in 6-8 hours.  You faint.  Your heart rate while sitting still is over 100 beats a minute.  You have trouble breathing. This information is not intended to replace advice given to you by your health care provider. Make sure you discuss any questions you have with your health care provider. Document Released: 01/01/2005 Document Revised: 07/29/2015 Document Reviewed: 02/25/2015 Elsevier Interactive Patient Education  2019 [employer]  

## 2018-03-31 ENCOUNTER — Inpatient Hospital Stay: Payer: Medicare Other

## 2018-03-31 ENCOUNTER — Encounter: Payer: Self-pay | Admitting: Hematology

## 2018-03-31 ENCOUNTER — Inpatient Hospital Stay (HOSPITAL_BASED_OUTPATIENT_CLINIC_OR_DEPARTMENT_OTHER): Payer: Medicare Other | Admitting: Hematology

## 2018-03-31 ENCOUNTER — Telehealth: Payer: Self-pay | Admitting: Hematology

## 2018-03-31 ENCOUNTER — Other Ambulatory Visit: Payer: Self-pay

## 2018-03-31 VITALS — BP 125/90 | HR 65 | Temp 98.2°F | Resp 18 | Ht 71.0 in | Wt 237.8 lb

## 2018-03-31 DIAGNOSIS — I471 Supraventricular tachycardia: Secondary | ICD-10-CM

## 2018-03-31 DIAGNOSIS — K5900 Constipation, unspecified: Secondary | ICD-10-CM

## 2018-03-31 DIAGNOSIS — Z95828 Presence of other vascular implants and grafts: Secondary | ICD-10-CM

## 2018-03-31 DIAGNOSIS — Z79899 Other long term (current) drug therapy: Secondary | ICD-10-CM

## 2018-03-31 DIAGNOSIS — F32A Depression, unspecified: Secondary | ICD-10-CM

## 2018-03-31 DIAGNOSIS — Z7982 Long term (current) use of aspirin: Secondary | ICD-10-CM

## 2018-03-31 DIAGNOSIS — C8198 Hodgkin lymphoma, unspecified, lymph nodes of multiple sites: Secondary | ICD-10-CM

## 2018-03-31 DIAGNOSIS — C8190 Hodgkin lymphoma, unspecified, unspecified site: Secondary | ICD-10-CM

## 2018-03-31 DIAGNOSIS — E871 Hypo-osmolality and hyponatremia: Secondary | ICD-10-CM

## 2018-03-31 DIAGNOSIS — F329 Major depressive disorder, single episode, unspecified: Secondary | ICD-10-CM

## 2018-03-31 DIAGNOSIS — T451X5A Adverse effect of antineoplastic and immunosuppressive drugs, initial encounter: Secondary | ICD-10-CM

## 2018-03-31 DIAGNOSIS — I700 Atherosclerosis of aorta: Secondary | ICD-10-CM

## 2018-03-31 DIAGNOSIS — D701 Agranulocytosis secondary to cancer chemotherapy: Secondary | ICD-10-CM

## 2018-03-31 DIAGNOSIS — D6481 Anemia due to antineoplastic chemotherapy: Secondary | ICD-10-CM | POA: Diagnosis not present

## 2018-03-31 DIAGNOSIS — Z5111 Encounter for antineoplastic chemotherapy: Secondary | ICD-10-CM | POA: Diagnosis not present

## 2018-03-31 DIAGNOSIS — K047 Periapical abscess without sinus: Secondary | ICD-10-CM

## 2018-03-31 DIAGNOSIS — E46 Unspecified protein-calorie malnutrition: Secondary | ICD-10-CM

## 2018-03-31 LAB — CMP (CANCER CENTER ONLY)
ALT: 26 U/L (ref 0–44)
AST: 17 U/L (ref 15–41)
Albumin: 3.8 g/dL (ref 3.5–5.0)
Alkaline Phosphatase: 93 U/L (ref 38–126)
Anion gap: 11 (ref 5–15)
BUN: 13 mg/dL (ref 8–23)
CO2: 25 mmol/L (ref 22–32)
Calcium: 9 mg/dL (ref 8.9–10.3)
Chloride: 95 mmol/L — ABNORMAL LOW (ref 98–111)
Creatinine: 0.85 mg/dL (ref 0.61–1.24)
GFR, Est AFR Am: >60 mL/min
GFR, Estimated: >60 mL/min
Glucose, Bld: 133 mg/dL — ABNORMAL HIGH (ref 70–99)
Potassium: 3.9 mmol/L (ref 3.5–5.1)
Sodium: 131 mmol/L — ABNORMAL LOW (ref 135–145)
Total Bilirubin: 0.7 mg/dL (ref 0.3–1.2)
Total Protein: 6.4 g/dL — ABNORMAL LOW (ref 6.5–8.1)

## 2018-03-31 LAB — CBC WITH DIFFERENTIAL (CANCER CENTER ONLY)
Abs Immature Granulocytes: 0.08 K/uL — ABNORMAL HIGH (ref 0.00–0.07)
Basophils Absolute: 0 K/uL (ref 0.0–0.1)
Basophils Relative: 1 %
Eosinophils Absolute: 0 K/uL (ref 0.0–0.5)
Eosinophils Relative: 1 %
HCT: 30.5 % — ABNORMAL LOW (ref 39.0–52.0)
Hemoglobin: 9.7 g/dL — ABNORMAL LOW (ref 13.0–17.0)
Immature Granulocytes: 4 %
Lymphocytes Relative: 36 %
Lymphs Abs: 0.8 K/uL (ref 0.7–4.0)
MCH: 26.9 pg (ref 26.0–34.0)
MCHC: 31.8 g/dL (ref 30.0–36.0)
MCV: 84.7 fL (ref 80.0–100.0)
Monocytes Absolute: 0.7 K/uL (ref 0.1–1.0)
Monocytes Relative: 36 %
Neutro Abs: 0.5 K/uL — ABNORMAL LOW (ref 1.7–7.7)
Neutrophils Relative %: 22 %
Platelet Count: 231 K/uL (ref 150–400)
RBC: 3.6 MIL/uL — ABNORMAL LOW (ref 4.22–5.81)
RDW: 18 % — ABNORMAL HIGH (ref 11.5–15.5)
WBC Count: 2.1 K/uL — ABNORMAL LOW (ref 4.0–10.5)
nRBC: 0 % (ref 0.0–0.2)

## 2018-03-31 LAB — MAGNESIUM: MAGNESIUM: 1.8 mg/dL (ref 1.7–2.4)

## 2018-03-31 MED ORDER — SODIUM CHLORIDE 0.9% FLUSH
10.0000 mL | INTRAVENOUS | Status: DC | PRN
  Administered 2018-03-31: 10 mL via INTRAVENOUS
  Filled 2018-03-31: qty 10

## 2018-03-31 MED ORDER — HEPARIN SOD (PORK) LOCK FLUSH 100 UNIT/ML IV SOLN
500.0000 [IU] | Freq: Once | INTRAVENOUS | Status: AC
  Administered 2018-03-31: 500 [IU] via INTRAVENOUS
  Filled 2018-03-31: qty 5

## 2018-03-31 MED ORDER — SODIUM CHLORIDE 0.9 % IV SOLN
Freq: Once | INTRAVENOUS | Status: AC
  Administered 2018-03-31: 11:00:00 via INTRAVENOUS
  Filled 2018-03-31: qty 250

## 2018-03-31 NOTE — Telephone Encounter (Signed)
Called and spoke with patient regarding updates we received on COVID-19 and being unable to bring his grand child with him to treatment on 3/17.  I explained to him that this was to protect him as well as his grandchild since the is a [place].

## 2018-03-31 NOTE — Progress Notes (Signed)
[place] OFFICE PROGRESS NOTE  Patient Care Team: Tysinger, Camelia Eng, PA-C as PCP - General (Family Medicine) Nahser, Wonda Cheng, MD as PCP - Cardiology (Cardiology) Constance Haw, MD as PCP - Electrophysiology (Cardiology)  HEME/ONC OVERVIEW: 1. Stage III classic Hodgkin lymphoma, subtype NOS due to limited specimen -Bx-proven R cervical LN involvement in 09/2017; PET in 11/2017 showed cervical, mediastinal and upper abdominal LN involement; treatment delayed due to pt traveling on a cruise after diagnosis -11/2017 - present: ABVD -12/2017: PET after 2 cycles of ABVD showed interval CR  -Chemotherapy delayed due to SVT requiring ablation in 01/2018 -Mid-01/2018 - present: AVD   2. Port placement in 11/2017   CHEMOTHERAPY REGIMEN:  11/26/2017 - 01/07/2018: ABVD x 2 cycles, CR  02/04/2018 - present: AVD, plan for 3 cycles due to patient's preference (instead of 4)  ASSESSMENT & PLAN:   Stage III classic Hodgkin lymphoma, favorable risk  -S/p 2 cycles of ABVD and 2 cycles of AVD -Labs appropriate today, proceed with C5D1 of AVD on 04/01/2018 -I have had multiple discussions with the patient regarding the recommended duration of AVD following CR post-ABVD; patient ultimately decided to receive 3 cycles of AVD instead of 4, understanding that there may be an increased risk of disease recurrence with truncated treatment duration, but the exact risk would not be quantifiable  -IV fluid support with each cycle of chemotherapy due to poor PO intake  -We we will plan to obtain PET in 3 to 4 weeks after the completion of chemotherapy to assess response -PR anti-emetics: Zofran, Compazine, dexamethasone, and Ativan  Chemotherapy-associated neutropenia -Secondary to chemotherapy -WBC 2.1k with ANC 500, stable -Patient denies any symptoms of infection -In the absence of infection, neutropenia alone would not delay treatment for Hodgkin lymphoma -We will monitor for now,  no indication for dose adjustment  Chemotherapy-associated anemia -Secondary to chemotherapy -Hgb 9.7, stable -Patient reports mild persistent fatigue, but denies any symptoms of bleeding -We will monitor for now; no indication for dose adjustment  Hyponatremia -Secondary to decreased p.o. intake -Na 131, worse since last week -Fluid support as outlined above, including additional days of IV fluids  -I counseled the patient on maintaining adequate salt intake in his diet and to increase his fluid intake   Malnutrition -Weight slightly down over the past past (~3 lbs) -He is taking 4 to 6 cans of nutritional supplements per day -I encouraged patient to adhere to recommendations from the nutritionist  Depression -Cymbalta started in 02/2018 -Overall, his mood appears to be slightly improving, but he continues to have intermittent episodes of depressed mood and crying at home -He denies any SI/HI -Continue Cymbalta for now  Dental abscess -Clinically, patient denies any persistent or worsening dental pain -He is on PRN amoxicillin, prescribed by his dentist, to control the dental abscess until he completes his chemotherapy -After he completes his chemotherapy, he may be able to proceed with dental procedure in 4 to 6 weeks  All questions were answered. The patient knows to call the clinic with any problems, questions or concerns. No barriers to learning was detected.  Return in 1 week for labs, port flush, and clinic appt prior to C5D1 of AVD.   No orders of the defined types were placed in this encounter.  All questions were answered. The patient knows to call the clinic with any problems, questions or concerns. No barriers to learning was detected.  Return in 1 week for labs and toxicity check after  C5D1 of AVD.   [name], MD 03/31/2018 11:11 AM  CHIEF COMPLAINT: "I am urinating a lot"  INTERVAL HISTORY: Bradley Novak returns to clinic for follow-up of Hodgkin lymphoma prior  to C5D1 of AVD.  The patient reports that over the past few days, he has noticed increasing frequency of urination, but denies any foul-smelling urine, hematuria, dysuria, backslash flank pain or fever.  His appetite has been relatively stable for the past few days.  He has lost approximately 3 pounds since last chemotherapy treatment.  He reports that his mood is still fluctuating somewhat, and has occasional episodes of crying and depressed mood, but denies any suicidal homicidal ideation.  He was scheduled to go on a cruise after his chemotherapy in 05/2018, but has canceled his trip after recent COVID-19 outbreak.  SUMMARY OF ONCOLOGIC HISTORY:   Hodgkin lymphoma ([place])   08/29/2017 Initial Diagnosis    Hodgkin lymphoma ([place])    10/07/2017 Imaging    CT neck w/ contrast: Enlarged nodes in the right jugular chain, posterior triangle, and supraclavicular fossa. The pattern is most concerning for lymphoma; multiple nodes are superficial and amenable to biopsy.  CT CAP w/ contrast: 1. Right supraclavicular and subcarinal adenopathy is identified. Differential considerations include lymphoproliferative disorder, metastatic adenopathy, granulomatous inflammation/infection or reactive adenopathy. Consider further evaluation with tissue sampling. 2. No acute findings, mass or adenopathy within the abdomen. 3. Lungs are clear.  No evidence for pneumonia.  4.  Aortic Atherosclerosis (ICD10-I70.0).    10/25/2017 Procedure    US-guided R cervical LN biopsy    10/25/2017 Pathology Results    (Accession: [id number]) Lymph node, needle/core biopsy, Right Cervical - CLASSIC HODGKIN LYMPHOMA - SEE COMMENT - CD30 (1%) - Unable to subtype due to limited sample specimen    11/12/2017 Cancer Staging    Staging form: Hodgkin and Non-Hodgkin Lymphoma, AJCC 8th Edition - Clinical stage from 11/12/2017: Stage II (Hodgkin lymphoma, A - Asymptomatic) - Signed by [name], MD on 11/12/2017    11/21/2017 -   Chemotherapy    The patient had DOXOrubicin (ADRIAMYCIN) chemo injection 60 mg, 25 mg/m2 = 60 mg, Intravenous,  Once, 0 of 4 cycles palonosetron (ALOXI) injection 0.25 mg, 0.25 mg, Intravenous,  Once, 0 of 4 cycles bleomycin (BLEOCIN) 24 Units in sodium chloride 0.9 % 50 mL chemo infusion, 10 Units/m2 = 24 Units, Intravenous,  Once, 0 of 4 cycles dacarbazine (DTIC) 910 mg in sodium chloride 0.9 % 250 mL chemo infusion, 375 mg/m2 = 910 mg, Intravenous,  Once, 0 of 4 cycles vinBLAStine (VELBAN) 14.6 mg in sodium chloride 0.9 % 50 mL chemo infusion, 6 mg/m2 = 14.6 mg, Intravenous, Once, 0 of 4 cycles fosaprepitant (EMEND) 150 mg, dexamethasone (DECADRON) 12 mg in sodium chloride 0.9 % 145 mL IVPB, , Intravenous,  Once, 0 of 4 cycles  for chemotherapy treatment.     11/21/2017 Imaging    PET: IMPRESSION: 1. Multiple relatively small hypermetabolic lymph nodes in the RIGHT neck, mediastinum and limited involvement of the upper abdomen. Lymph node activity is high (greater than liver, [place] 4). 2. The most intense enlarged lymph node is anterior to the sternocleidomastoid muscle on the RIGHT. There are multiple assessable lymph nodes in the RIGHT cervical chain. 3. Minimal hypermetabolic adenopathy in the upper abdomen. No pelvic or inguinal metastatic adenopathy. 4. Normal spleen and bone marrow. 5. Single focus of metabolic activity in the sigmoid colon. Recommend screening colonoscopy if patient is not current for such.    01/10/2018  Imaging    PET (after 2 cycles of ABVD) IMPRESSION: 1. Interval complete response to therapy. No abnormal areas of persistent hypermetabolic adenopathy identified. 2.  Aortic Atherosclerosis (ICD10-I70.0). 3. Multi vessel coronary artery atherosclerotic calcifications.     REVIEW OF SYSTEMS:   Constitutional: ( - ) fevers, ( - )  chills , ( - ) night sweats Eyes: ( - ) blurriness of vision, ( - ) double vision, ( - ) watery eyes Ears, nose, mouth, throat,  and face: ( - ) mucositis, ( - ) sore throat Respiratory: ( - ) cough, ( - ) dyspnea, ( - ) wheezes Cardiovascular: ( - ) palpitation, ( - ) chest discomfort, ( - ) lower extremity swelling Gastrointestinal:  ( - ) nausea, ( - ) heartburn, ( - ) change in bowel habits Skin: ( - ) abnormal skin rashes Lymphatics: ( - ) new lymphadenopathy, ( - ) easy bruising Neurological: ( - ) numbness, ( - ) tingling, ( - ) new weaknesses Behavioral/Psych: ( - ) mood change, ( - ) new changes  All other systems were reviewed with the patient and are negative.  I have reviewed the past medical history, past surgical history, social history and family history with the patient and they are unchanged from previous note.  ALLERGIES:  has No Known Allergies.  MEDICATIONS:  Current Outpatient Medications  Medication Sig Dispense Refill  . amoxicillin (AMOXIL) 500 MG capsule Take 500 mg by mouth 2 (two) times daily.    Marland Kitchen aspirin EC 81 MG tablet Take 1 tablet (81 mg total) by mouth daily. 90 tablet 3  . atorvastatin (LIPITOR) 20 MG tablet TAKE 1 TABLET BY MOUTH DAILY (Patient taking differently: Take 20 mg by mouth daily. ) 90 tablet 2  . Cholecalciferol (VITAMIN D PO) Take 1 capsule by mouth at bedtime.    Marland Kitchen dexamethasone (DECADRON) 4 MG tablet Take 2 tablets by mouth once a day on the day after chemotherapy and then take 2 tablets two times a day for 2 days. Take with food. 30 tablet 1  . diclofenac (VOLTAREN) 75 MG EC tablet Take 75 mg by mouth daily.    Marland Kitchen diltiazem (CARDIZEM CD) 120 MG 24 hr capsule Take 1 capsule (120 mg total) by mouth daily. 90 capsule 1  . diltiazem (CARDIZEM) 30 MG tablet Take 30 mg by mouth daily as needed (RAPID HEART RATE).    . DULoxetine (CYMBALTA) 30 MG capsule Take 1 capsule (30 mg total) by mouth daily for 7 days, THEN 2 capsules (60 mg total) daily for 30 days. 67 capsule 5  . HYDROcodone-acetaminophen (NORCO) 7.5-325 MG tablet Take 1 tablet by mouth every 6 (six) hours as needed  for moderate pain. 12 tablet 0  . ibuprofen (ADVIL,MOTRIN) 200 MG tablet Take 400 mg by mouth daily as needed for moderate pain.    Marland Kitchen LORazepam (ATIVAN) 0.5 MG tablet Take 1 tablet (0.5 mg total) by mouth every 6 (six) hours as needed (Nausea or vomiting). 30 tablet 0  . metoCLOPramide (REGLAN) 10 MG tablet Take 1 tablet (10 mg total) by mouth 3 (three) times daily before meals for 14 days. 42 tablet 0  . metoprolol succinate (TOPROL XL) 100 MG 24 hr tablet Take 1 tablet (100 mg total) by mouth 2 (two) times daily. Take with or immediately following a meal. 180 tablet 3  . nitroGLYCERIN (NITROSTAT) 0.3 MG SL tablet Place 1 tablet (0.3 mg total) under the tongue every 5 (five) minutes as  needed for chest pain. 30 tablet 0  . potassium chloride SA (K-DUR,KLOR-CON) 20 MEQ tablet Take 1 tablet (20 mEq total) by mouth 2 (two) times daily. 180 tablet 0  . prochlorperazine (COMPAZINE) 10 MG tablet Take 1 tablet (10 mg total) by mouth every 6 (six) hours as needed (Nausea or vomiting). 30 tablet 1  . ranitidine (ZANTAC) 150 MG tablet Take 150 mg by mouth daily as needed for heartburn.    . vitamin B-12 (CYANOCOBALAMIN) 100 MCG tablet Take 100 mcg by mouth daily.     No current facility-administered medications for this visit.     PHYSICAL EXAMINATION: ECOG PERFORMANCE STATUS: 1 - Symptomatic but completely ambulatory  Today's Vitals   03/31/18 1104  BP: 125/90  Pulse: 65  Resp: 18  Temp: 98.2 F (36.8 C)  TempSrc: Oral  SpO2: 99%  Weight: 237 lb 12 oz (107.8 kg)  Height: 5\' 11"  (1.803 m)   Body mass index is 33.16 kg/m.  Filed Weights   03/31/18 1104  Weight: 237 lb 12 oz (107.8 kg)    GENERAL: alert, no distress and comfortable SKIN: skin color, texture, turgor are normal, no rashes or significant lesions EYES: conjunctiva are pink and non-injected, sclera clear OROPHARYNX: no exudate, no erythema; lips, buccal mucosa, and tongue normal  NECK: supple, non-tender LYMPH:  no palpable  lymphadenopathy in the cervical LUNGS: clear to auscultation with normal breathing effort HEART: regular rate & rhythm and no murmurs and no lower extremity edema ABDOMEN: soft, non-tender, non-distended, normal bowel sounds Musculoskeletal: no cyanosis of digits and no clubbing  PSYCH: alert & oriented x 3, fluent speech NEURO: no focal motor/sensory deficits  LABORATORY DATA:  I have reviewed the data as listed    Component Value Date/Time   NA 134 (L) 03/24/2018 1235   NA 141 08/29/2017 1155   K 3.6 03/24/2018 1235   CL 99 03/24/2018 1235   CO2 28 03/24/2018 1235   GLUCOSE 160 (H) 03/24/2018 1235   BUN 23 03/24/2018 1235   BUN 16 08/29/2017 1155   CREATININE 0.68 03/24/2018 1235   CREATININE 0.83 09/27/2014 0001   CALCIUM 8.1 (L) 03/24/2018 1235   PROT 5.2 (L) 03/24/2018 1235   PROT 7.4 08/29/2017 1155   ALBUMIN 3.2 (L) 03/24/2018 1235   ALBUMIN 4.1 08/29/2017 1155   AST 21 03/24/2018 1235   ALT 48 (H) 03/24/2018 1235   ALKPHOS 54 03/24/2018 1235   BILITOT 0.5 03/24/2018 1235   GFRNONAA >60 03/24/2018 1235   GFRAA >60 03/24/2018 1235    No results found for: SPEP, UPEP  Lab Results  Component Value Date   WBC 2.1 (L) 03/31/2018   NEUTROABS PENDING 03/31/2018   HGB 9.7 (L) 03/31/2018   HCT 30.5 (L) 03/31/2018   MCV 84.7 03/31/2018   PLT 231 03/31/2018      Chemistry      Component Value Date/Time   NA 134 (L) 03/24/2018 1235   NA 141 08/29/2017 1155   K 3.6 03/24/2018 1235   CL 99 03/24/2018 1235   CO2 28 03/24/2018 1235   BUN 23 03/24/2018 1235   BUN 16 08/29/2017 1155   CREATININE 0.68 03/24/2018 1235   CREATININE 0.83 09/27/2014 0001      Component Value Date/Time   CALCIUM 8.1 (L) 03/24/2018 1235   ALKPHOS 54 03/24/2018 1235   AST 21 03/24/2018 1235   ALT 48 (H) 03/24/2018 1235   BILITOT 0.5 03/24/2018 1235

## 2018-03-31 NOTE — Patient Instructions (Signed)
Implanted Port Insertion, Care After  This sheet gives you information about how to care for yourself after your procedure. Your health care provider may also give you more specific instructions. If you have problems or questions, contact your health care provider.  What can I expect after the procedure?  After the procedure, it is common to have:  · Discomfort at the port insertion site.  · Bruising on the skin over the port. This should improve over 3-4 days.  Follow these instructions at home:  Port care  · After your port is placed, you will get a manufacturer's information card. The card has information about your port. Keep this card with you at all times.  · Take care of the port as told by your health care provider. Ask your health care provider if you or a family member can get training for taking care of the port at home. A home health care nurse may also take care of the port.  · Make sure to remember what type of port you have.  Incision care         · Follow instructions from your health care provider about how to take care of your port insertion site. Make sure you:  ? Wash your hands with soap and water before and after you change your bandage (dressing). If soap and water are not available, use hand sanitizer.  ? Change your dressing as told by your health care provider.  ? Leave stitches (sutures), skin glue, or adhesive strips in place. These skin closures may need to stay in place for 2 weeks or longer. If adhesive strip edges start to loosen and curl up, you may trim the loose edges. Do not remove adhesive strips completely unless your health care provider tells you to do that.  · Check your port insertion site every day for signs of infection. Check for:  ? Redness, swelling, or pain.  ? Fluid or blood.  ? Warmth.  ? Pus or a bad smell.  Activity  · Return to your normal activities as told by your health care provider. Ask your health care provider what activities are safe for you.  · Do not  lift anything that is heavier than 10 lb (4.5 kg), or the limit that you are told, until your health care provider says that it is safe.  General instructions  · Take over-the-counter and prescription medicines only as told by your health care provider.  · Do not take baths, swim, or use a hot tub until your health care provider approves. Ask your health care provider if you may take showers. You may only be allowed to take sponge baths.  · Do not drive for 24 hours if you were given a sedative during your procedure.  · Wear a medical alert bracelet in case of an emergency. This will tell any health care providers that you have a port.  · Keep all follow-up visits as told by your health care provider. This is important.  Contact a health care provider if:  · You cannot flush your port with saline as directed, or you cannot draw blood from the port.  · You have a fever or chills.  · You have redness, swelling, or pain around your port insertion site.  · You have fluid or blood coming from your port insertion site.  · Your port insertion site feels warm to the touch.  · You have pus or a bad smell coming from the port   insertion site.  Get help right away if:  · You have chest pain or shortness of breath.  · You have bleeding from your port that you cannot control.  Summary  · Take care of the port as told by your health care provider. Keep the manufacturer's information card with you at all times.  · Change your dressing as told by your health care provider.  · Contact a health care provider if you have a fever or chills or if you have redness, swelling, or pain around your port insertion site.  · Keep all follow-up visits as told by your health care provider.  This information is not intended to replace advice given to you by your health care provider. Make sure you discuss any questions you have with your health care provider.  Document Released: 10/22/2012 Document Revised: 07/30/2017 Document Reviewed:  07/30/2017  Elsevier Interactive Patient Education © 2019 [employer]

## 2018-03-31 NOTE — Patient Instructions (Signed)
Dehydration, Adult  Dehydration is when there is not enough fluid or water in your body. This happens when you lose more fluids than you take in. Dehydration can range from mild to very bad. It should be treated right away to keep it from getting very bad. Symptoms of mild dehydration may include:  Thirst.  Dry lips.  Slightly dry mouth.  Dry, warm skin.  Dizziness. Symptoms of moderate dehydration may include:  Very dry mouth.  Muscle cramps.  Dark pee (urine). Pee may be the color of tea.  Your body making less pee.  Your eyes making fewer tears.  Heartbeat that is uneven or faster than normal (palpitations).  Headache.  Light-headedness, especially when you stand up from sitting.  Fainting (syncope). Symptoms of very bad dehydration may include:  Changes in skin, such as: ? Cold and clammy skin. ? Blotchy (mottled) or pale skin. ? Skin that does not quickly return to normal after being lightly pinched and let go (poor skin turgor).  Changes in body fluids, such as: ? Feeling very thirsty. ? Your eyes making fewer tears. ? Not sweating when body temperature is high, such as in hot weather. ? Your body making very little pee.  Changes in vital signs, such as: ? Weak pulse. ? Pulse that is more than 100 beats a minute when you are sitting still. ? Fast breathing. ? Low blood pressure.  Other changes, such as: ? Sunken eyes. ? Cold hands and feet. ? Confusion. ? Lack of energy (lethargy). ? Trouble waking up from sleep. ? Short-term weight loss. ? Unconsciousness. Follow these instructions at home:   If told by your doctor, drink an ORS: ? Make an ORS by using instructions on the package. ? Start by drinking small amounts, about  cup (120 mL) every 5-10 minutes. ? Slowly drink more until you have had the amount that your doctor said to have.  Drink enough clear fluid to keep your pee clear or pale yellow. If you were told to drink an ORS, finish the  ORS first, then start slowly drinking clear fluids. Drink fluids such as: ? Water. Do not drink only water by itself. Doing that can make the salt (sodium) level in your body get too low (hyponatremia). ? Ice chips. ? Fruit juice that you have added water to (diluted). ? Low-calorie sports drinks.  Avoid: ? Alcohol. ? Drinks that have a lot of sugar. These include high-calorie sports drinks, fruit juice that does not have water added, and soda. ? Caffeine. ? Foods that are greasy or have a lot of fat or sugar.  Take over-the-counter and prescription medicines only as told by your doctor.  Do not take salt tablets. Doing that can make the salt level in your body get too high (hypernatremia).  Eat foods that have minerals (electrolytes). Examples include bananas, oranges, potatoes, tomatoes, and spinach.  Keep all follow-up visits as told by your doctor. This is important. Contact a doctor if:  You have belly (abdominal) pain that: ? Gets worse. ? Stays in one area (localizes).  You have a rash.  You have a stiff neck.  You get angry or annoyed more easily than normal (irritability).  You are more sleepy than normal.  You have a harder time waking up than normal.  You feel: ? Weak. ? Dizzy. ? Very thirsty.  You have peed (urinated) only a small amount of very dark pee during 6-8 hours. Get help right away if:  You have   symptoms of very bad dehydration.  You cannot drink fluids without throwing up (vomiting).  Your symptoms get worse with treatment.  You have a fever.  You have a very bad headache.  You are throwing up or having watery poop (diarrhea) and it: ? Gets worse. ? Does not go away.  You have blood or something green (bile) in your throw-up.  You have blood in your poop (stool). This may cause poop to look black and tarry.  You have not peed in 6-8 hours.  You pass out (faint).  Your heart rate when you are sitting still is more than 100 beats a  minute.  You have trouble breathing. This information is not intended to replace advice given to you by your health care provider. Make sure you discuss any questions you have with your health care provider. Document Released: 10/28/2008 Document Revised: 07/22/2015 Document Reviewed: 02/25/2015 Elsevier Interactive Patient Education  2019 [employer]  

## 2018-04-01 ENCOUNTER — Inpatient Hospital Stay: Payer: Medicare Other

## 2018-04-01 VITALS — BP 118/74 | HR 65 | Temp 98.1°F | Resp 18

## 2018-04-01 DIAGNOSIS — K047 Periapical abscess without sinus: Secondary | ICD-10-CM | POA: Diagnosis not present

## 2018-04-01 DIAGNOSIS — Z5111 Encounter for antineoplastic chemotherapy: Secondary | ICD-10-CM | POA: Diagnosis not present

## 2018-04-01 DIAGNOSIS — D701 Agranulocytosis secondary to cancer chemotherapy: Secondary | ICD-10-CM | POA: Diagnosis not present

## 2018-04-01 DIAGNOSIS — C8190 Hodgkin lymphoma, unspecified, unspecified site: Secondary | ICD-10-CM

## 2018-04-01 DIAGNOSIS — D6481 Anemia due to antineoplastic chemotherapy: Secondary | ICD-10-CM | POA: Diagnosis not present

## 2018-04-01 DIAGNOSIS — C8198 Hodgkin lymphoma, unspecified, lymph nodes of multiple sites: Secondary | ICD-10-CM | POA: Diagnosis not present

## 2018-04-01 DIAGNOSIS — T451X5A Adverse effect of antineoplastic and immunosuppressive drugs, initial encounter: Secondary | ICD-10-CM | POA: Diagnosis not present

## 2018-04-01 MED ORDER — VINBLASTINE SULFATE CHEMO INJECTION 1 MG/ML
6.1700 mg/m2 | Freq: Once | INTRAVENOUS | Status: AC
  Administered 2018-04-01: 15 mg via INTRAVENOUS
  Filled 2018-04-01: qty 15

## 2018-04-01 MED ORDER — DOXORUBICIN HCL CHEMO IV INJECTION 2 MG/ML
25.0000 mg/m2 | Freq: Once | INTRAVENOUS | Status: AC
  Administered 2018-04-01: 60 mg via INTRAVENOUS
  Filled 2018-04-01: qty 30

## 2018-04-01 MED ORDER — SODIUM CHLORIDE 0.9 % IV SOLN
Freq: Once | INTRAVENOUS | Status: AC
  Administered 2018-04-01: 09:00:00 via INTRAVENOUS
  Filled 2018-04-01: qty 5

## 2018-04-01 MED ORDER — PALONOSETRON HCL INJECTION 0.25 MG/5ML
0.2500 mg | Freq: Once | INTRAVENOUS | Status: AC
  Administered 2018-04-01: 0.25 mg via INTRAVENOUS

## 2018-04-01 MED ORDER — PALONOSETRON HCL INJECTION 0.25 MG/5ML
INTRAVENOUS | Status: AC
  Filled 2018-04-01: qty 5

## 2018-04-01 MED ORDER — SODIUM CHLORIDE 0.9 % IV SOLN
370.0000 mg/m2 | Freq: Once | INTRAVENOUS | Status: AC
  Administered 2018-04-01: 900 mg via INTRAVENOUS
  Filled 2018-04-01: qty 90

## 2018-04-01 MED ORDER — HEPARIN SOD (PORK) LOCK FLUSH 100 UNIT/ML IV SOLN
500.0000 [IU] | Freq: Once | INTRAVENOUS | Status: AC | PRN
  Administered 2018-04-01: 500 [IU]
  Filled 2018-04-01: qty 5

## 2018-04-01 MED ORDER — SODIUM CHLORIDE 0.9 % IV SOLN
Freq: Once | INTRAVENOUS | Status: AC
  Administered 2018-04-01: 08:00:00 via INTRAVENOUS
  Filled 2018-04-01: qty 250

## 2018-04-01 MED ORDER — SODIUM CHLORIDE 0.9% FLUSH
10.0000 mL | INTRAVENOUS | Status: DC | PRN
  Administered 2018-04-01: 10 mL
  Filled 2018-04-01: qty 10

## 2018-04-01 NOTE — Patient Instructions (Signed)
Dacarbazine, DTIC injection What is this medicine? DACARBAZINE (da KAR ba zeen) is a chemotherapy drug. This medicine is used to treat skin cancer. It is also used with other medicines to treat Hodgkin's disease. This medicine may be used for other purposes; ask your health care provider or pharmacist if you have questions. COMMON BRAND NAME(S): DTIC-Dome What should I tell my health care provider before I take this medicine? They need to know if you have any of these conditions: -infection (especially virus infection such as chickenpox, cold sores, or herpes) -kidney disease -liver disease -low blood counts like low platelets, red blood cells, white blood cells -recent radiation therapy -an unusual or allergic reaction to dacarbazine, other chemotherapy agents, other medicines, foods, dyes, or preservatives -pregnant or trying to get pregnant -breast-feeding How should I use this medicine? This drug is given as an injection or infusion into a vein. It is administered in a hospital or clinic by a specially trained health care professional. Talk to your pediatrician regarding the use of this medicine in children. While this drug may be prescribed for selected conditions, precautions do apply. Overdosage: If you think you have taken too much of this medicine contact a poison control center or emergency room at once. NOTE: This medicine is only for you. Do not share this medicine with others. What if I miss a dose? It is important not to miss your dose. Call your doctor or health care professional if you are unable to keep an appointment. What may interact with this medicine? -medicines to increase blood counts like filgrastim, pegfilgrastim, sargramostim -vaccines This list may not describe all possible interactions. Give your health care provider a list of all the medicines, herbs, non-prescription drugs, or dietary supplements you use. Also tell them if you smoke, drink alcohol, or use  illegal drugs. Some items may interact with your medicine. What should I watch for while using this medicine? Your condition will be monitored carefully while you are receiving this medicine. You will need important blood work done while you are taking this medicine. This drug may make you feel generally unwell. This is not uncommon, as chemotherapy can affect healthy cells as well as cancer cells. Report any side effects. Continue your course of treatment even though you feel ill unless your doctor tells you to stop. Call your doctor or health care professional for advice if you get a fever, chills or sore throat, or other symptoms of a cold or flu. Do not treat yourself. This drug decreases your body's ability to fight infections. Try to avoid being around people who are sick. This medicine may increase your risk to bruise or bleed. Call your doctor or health care professional if you notice any unusual bleeding. Talk to your doctor about your risk of cancer. You may be more at risk for certain types of cancers if you take this medicine. Do not become pregnant while taking this medicine. Women should inform their doctor if they wish to become pregnant or think they might be pregnant. There is a potential for serious side effects to an unborn child. Talk to your health care professional or pharmacist for more information. Do not breast-feed an infant while taking this medicine. What side effects may I notice from receiving this medicine? Side effects that you should report to your doctor or health care professional as soon as possible: -allergic reactions like skin rash, itching or hives, swelling of the face, lips, or tongue -low blood counts - this medicine may  decrease the number of white blood cells, red blood cells and platelets. You may be at increased risk for infections and bleeding. -signs of infection - fever or chills, cough, sore throat, pain or difficulty passing urine -signs of decreased  platelets or bleeding - bruising, pinpoint red spots on the skin, black, tarry stools, blood in the urine -signs of decreased red blood cells - unusually weak or tired, fainting spells, lightheadedness -breathing problems -muscle pains -pain at site where injected -trouble passing urine or change in the amount of urine -vomiting -yellowing of the eyes or skin Side effects that usually do not require medical attention (report to your doctor or health care professional if they continue or are bothersome): -diarrhea -hair loss -loss of appetite -nausea -skin more sensitive to sun or ultraviolet light -stomach upset This list may not describe all possible side effects. Call your doctor for medical advice about side effects. You may report side effects to FDA at 1-800-FDA-1088. Where should I keep my medicine? This drug is given in a hospital or clinic and will not be stored at home. NOTE: This sheet is a summary. It may not cover all possible information. If you have questions about this medicine, talk to your doctor, pharmacist, or health care provider.  2019 [employer] Standard (2015-03-04 15:17:39) Vinblastine injection What is this medicine? VINBLASTINE (vin BLAS teen) is a chemotherapy drug. It slows the growth of cancer cells. This medicine is used to treat many types of cancer like breast cancer, testicular cancer, Hodgkin's disease, non-Hodgkin's lymphoma, and sarcoma. This medicine may be used for other purposes; ask your health care provider or pharmacist if you have questions. COMMON BRAND NAME(S): Velban What should I tell my health care provider before I take this medicine? They need to know if you have any of these conditions: -blood disorders -dental disease -gout -infection (especially a virus infection such as chickenpox, cold sores, or herpes) -liver disease -lung disease -nervous system disease -recent or ongoing radiation therapy -an unusual or allergic reaction  to vinblastine, other chemotherapy agents, other medicines, foods, dyes, or preservatives -pregnant or trying to get pregnant -breast-feeding How should I use this medicine? This drug is given as an infusion into a vein. It is administered in a hospital or clinic by a specially trained health care professional. If you have pain, swelling, burning or any unusual feeling around the site of your injection, tell your health care professional right away. Talk to your pediatrician regarding the use of this medicine in children. While this drug may be prescribed for selected conditions, precautions do apply. Overdosage: If you think you have taken too much of this medicine contact a poison control center or emergency room at once. NOTE: This medicine is only for you. Do not share this medicine with others. What if I miss a dose? It is important not to miss your dose. Call your doctor or health care professional if you are unable to keep an appointment. What may interact with this medicine? Do not take this medicine with any of the following medications: -erythromycin -itraconazole -mibefradil -voriconazole This medicine may also interact with the following medications: -cyclosporine -fluconazole -ketoconazole -medicines for seizures like phenytoin -medicines to increase blood counts like filgrastim, pegfilgrastim, sargramostim -vaccines -verapamil Talk to your doctor or health care professional before taking any of these medicines: -acetaminophen -aspirin -ibuprofen -ketoprofen -naproxen This list may not describe all possible interactions. Give your health care provider a list of all the medicines, herbs, non-prescription drugs, or dietary  supplements you use. Also tell them if you smoke, drink alcohol, or use illegal drugs. Some items may interact with your medicine. What should I watch for while using this medicine? Your condition will be monitored carefully while you are receiving this  medicine. You will need important blood work done while you are taking this medicine. This drug may make you feel generally unwell. This is not uncommon, as chemotherapy can affect healthy cells as well as cancer cells. Report any side effects. Continue your course of treatment even though you feel ill unless your doctor tells you to stop. In some cases, you may be given additional medicines to help with side effects. Follow all directions for their use. Call your doctor or health care professional for advice if you get a fever, chills or sore throat, or other symptoms of a cold or flu. Do not treat yourself. This drug decreases your body's ability to fight infections. Try to avoid being around people who are sick. This medicine may increase your risk to bruise or bleed. Call your doctor or health care professional if you notice any unusual bleeding. Be careful brushing and flossing your teeth or using a toothpick because you may get an infection or bleed more easily. If you have any dental work done, tell your dentist you are receiving this medicine. Avoid taking products that contain aspirin, acetaminophen, ibuprofen, naproxen, or ketoprofen unless instructed by your doctor. These medicines may hide a fever. Do not become pregnant while taking this medicine. Women should inform their doctor if they wish to become pregnant or think they might be pregnant. There is a potential for serious side effects to an unborn child. Talk to your health care professional or pharmacist for more information. Do not breast-feed an infant while taking this medicine. Men may have a lower sperm count while taking this medicine. Talk to your doctor if you plan to father a child. What side effects may I notice from receiving this medicine? Side effects that you should report to your doctor or health care professional as soon as possible: -allergic reactions like skin rash, itching or hives, swelling of the face, lips, or  tongue -low blood counts - This drug may decrease the number of white blood cells, red blood cells and platelets. You may be at increased risk for infections and bleeding. -signs of infection - fever or chills, cough, sore throat, pain or difficulty passing urine -signs of decreased platelets or bleeding - bruising, pinpoint red spots on the skin, black, tarry stools, nosebleeds -signs of decreased red blood cells - unusually weak or tired, fainting spells, lightheadedness -breathing problems -changes in hearing -change in the amount of urine -chest pain -high blood pressure -mouth sores -nausea and vomiting -pain, swelling, redness or irritation at the injection site -pain, tingling, numbness in the hands or feet -problems with balance, dizziness -seizures Side effects that usually do not require medical attention (report to your doctor or health care professional if they continue or are bothersome): -constipation -hair loss -jaw pain -loss of appetite -sensitivity to light -stomach pain -tumor pain This list may not describe all possible side effects. Call your doctor for medical advice about side effects. You may report side effects to FDA at 1-800-FDA-1088. Where should I keep my medicine? This drug is given in a hospital or clinic and will not be stored at home. NOTE: This sheet is a summary. It may not cover all possible information. If you have questions about this medicine, talk to  your doctor, pharmacist, or health care provider.  2019 [employer]/Gold Standard (2007-09-29 17:15:59) Doxorubicin injection What is this medicine? DOXORUBICIN (dox oh ROO bi sin) is a chemotherapy drug. It is used to treat many kinds of cancer like leukemia, lymphoma, neuroblastoma, sarcoma, and Wilms' tumor. It is also used to treat bladder cancer, breast cancer, lung cancer, ovarian cancer, stomach cancer, and thyroid cancer. This medicine may be used for other purposes; ask your health care  provider or pharmacist if you have questions. COMMON BRAND NAME(S): Adriamycin, Adriamycin PFS, Adriamycin RDF, Rubex What should I tell my health care provider before I take this medicine? They need to know if you have any of these conditions: -heart disease -history of low blood counts caused by a medicine -liver disease -recent or ongoing radiation therapy -an unusual or allergic reaction to doxorubicin, other chemotherapy agents, other medicines, foods, dyes, or preservatives -pregnant or trying to get pregnant -breast-feeding How should I use this medicine? This drug is given as an infusion into a vein. It is administered in a hospital or clinic by a specially trained health care professional. If you have pain, swelling, burning or any unusual feeling around the site of your injection, tell your health care professional right away. Talk to your pediatrician regarding the use of this medicine in children. Special care may be needed. Overdosage: If you think you have taken too much of this medicine contact a poison control center or emergency room at once. NOTE: This medicine is only for you. Do not share this medicine with others. What if I miss a dose? It is important not to miss your dose. Call your doctor or health care professional if you are unable to keep an appointment. What may interact with this medicine? This medicine may interact with the following medications: -6-mercaptopurine -paclitaxel -phenytoin -St. John's Wort -trastuzumab -verapamil This list may not describe all possible interactions. Give your health care provider a list of all the medicines, herbs, non-prescription drugs, or dietary supplements you use. Also tell them if you smoke, drink alcohol, or use illegal drugs. Some items may interact with your medicine. What should I watch for while using this medicine? This drug may make you feel generally unwell. This is not uncommon, as chemotherapy can affect healthy  cells as well as cancer cells. Report any side effects. Continue your course of treatment even though you feel ill unless your doctor tells you to stop. There is a maximum amount of this medicine you should receive throughout your life. The amount depends on the medical condition being treated and your overall health. Your doctor will watch how much of this medicine you receive in your lifetime. Tell your doctor if you have taken this medicine before. You may need blood work done while you are taking this medicine. Your urine may turn red for a few days after your dose. This is not blood. If your urine is dark or brown, call your doctor. In some cases, you may be given additional medicines to help with side effects. Follow all directions for their use. Call your doctor or health care professional for advice if you get a fever, chills or sore throat, or other symptoms of a cold or flu. Do not treat yourself. This drug decreases your body's ability to fight infections. Try to avoid being around people who are sick. This medicine may increase your risk to bruise or bleed. Call your doctor or health care professional if you notice any unusual bleeding. Talk to your  doctor about your risk of cancer. You may be more at risk for certain types of cancers if you take this medicine. Do not become pregnant while taking this medicine or for 6 months after stopping it. Women should inform their doctor if they wish to become pregnant or think they might be pregnant. Men should not father a child while taking this medicine and for 6 months after stopping it. There is a potential for serious side effects to an unborn child. Talk to your health care professional or pharmacist for more information. Do not breast-feed an infant while taking this medicine. This medicine has caused ovarian failure in some women and reduced sperm counts in some men This medicine may interfere with the ability to have a child. Talk with your  doctor or health care professional if you are concerned about your fertility. This medicine may cause a decrease in Co-Enzyme Q-10. You should make sure that you get enough Co-Enzyme Q-10 while you are taking this medicine. Discuss the foods you eat and the vitamins you take with your health care professional. What side effects may I notice from receiving this medicine? Side effects that you should report to your doctor or health care professional as soon as possible: -allergic reactions like skin rash, itching or hives, swelling of the face, lips, or tongue -breathing problems -chest pain -fast or irregular heartbeat -low blood counts - this medicine may decrease the number of white blood cells, red blood cells and platelets. You may be at increased risk for infections and bleeding. -pain, redness, or irritation at site where injected -signs of infection - fever or chills, cough, sore throat, pain or difficulty passing urine -signs of decreased platelets or bleeding - bruising, pinpoint red spots on the skin, black, tarry stools, blood in the urine -swelling of the ankles, feet, hands -tiredness -weakness Side effects that usually do not require medical attention (report to your doctor or health care professional if they continue or are bothersome): -diarrhea -hair loss -mouth sores -nail discoloration or damage -nausea -red colored urine -vomiting This list may not describe all possible side effects. Call your doctor for medical advice about side effects. You may report side effects to FDA at 1-800-FDA-1088. Where should I keep my medicine? This drug is given in a hospital or clinic and will not be stored at home. NOTE: This sheet is a summary. It may not cover all possible information. If you have questions about this medicine, talk to your doctor, pharmacist, or health care provider.  2019 [employer] Standard (2016-08-15 11:01:26)

## 2018-04-04 ENCOUNTER — Other Ambulatory Visit: Payer: Self-pay

## 2018-04-04 ENCOUNTER — Inpatient Hospital Stay: Payer: Medicare Other

## 2018-04-04 ENCOUNTER — Ambulatory Visit: Payer: Medicare Other

## 2018-04-04 VITALS — BP 164/85 | HR 59 | Temp 97.7°F | Resp 17

## 2018-04-04 DIAGNOSIS — D701 Agranulocytosis secondary to cancer chemotherapy: Secondary | ICD-10-CM | POA: Diagnosis not present

## 2018-04-04 DIAGNOSIS — T451X5A Adverse effect of antineoplastic and immunosuppressive drugs, initial encounter: Secondary | ICD-10-CM | POA: Diagnosis not present

## 2018-04-04 DIAGNOSIS — Z5111 Encounter for antineoplastic chemotherapy: Secondary | ICD-10-CM | POA: Diagnosis not present

## 2018-04-04 DIAGNOSIS — D6481 Anemia due to antineoplastic chemotherapy: Secondary | ICD-10-CM | POA: Diagnosis not present

## 2018-04-04 DIAGNOSIS — C8198 Hodgkin lymphoma, unspecified, lymph nodes of multiple sites: Secondary | ICD-10-CM | POA: Diagnosis not present

## 2018-04-04 DIAGNOSIS — K047 Periapical abscess without sinus: Secondary | ICD-10-CM | POA: Diagnosis not present

## 2018-04-04 DIAGNOSIS — C8190 Hodgkin lymphoma, unspecified, unspecified site: Secondary | ICD-10-CM

## 2018-04-04 MED ORDER — SODIUM CHLORIDE 0.9 % IV SOLN
Freq: Once | INTRAVENOUS | Status: AC
  Administered 2018-04-04: 11:00:00 via INTRAVENOUS
  Filled 2018-04-04: qty 250

## 2018-04-07 ENCOUNTER — Inpatient Hospital Stay: Payer: Medicare Other

## 2018-04-07 ENCOUNTER — Inpatient Hospital Stay: Payer: Medicare Other | Admitting: Hematology

## 2018-04-08 ENCOUNTER — Telehealth: Payer: Self-pay | Admitting: Hematology

## 2018-04-08 NOTE — Telephone Encounter (Signed)
Called and spoke with patient's wife she stated that he is having some issues and would like to talk with Dr Maylon Peppers

## 2018-04-09 ENCOUNTER — Other Ambulatory Visit: Payer: Self-pay

## 2018-04-09 ENCOUNTER — Inpatient Hospital Stay: Payer: Medicare Other

## 2018-04-09 VITALS — BP 134/75 | HR 68 | Temp 98.1°F | Resp 19

## 2018-04-09 DIAGNOSIS — D701 Agranulocytosis secondary to cancer chemotherapy: Secondary | ICD-10-CM | POA: Diagnosis not present

## 2018-04-09 DIAGNOSIS — Z5111 Encounter for antineoplastic chemotherapy: Secondary | ICD-10-CM | POA: Diagnosis not present

## 2018-04-09 DIAGNOSIS — C8190 Hodgkin lymphoma, unspecified, unspecified site: Secondary | ICD-10-CM

## 2018-04-09 DIAGNOSIS — K047 Periapical abscess without sinus: Secondary | ICD-10-CM | POA: Diagnosis not present

## 2018-04-09 DIAGNOSIS — C8198 Hodgkin lymphoma, unspecified, lymph nodes of multiple sites: Secondary | ICD-10-CM | POA: Diagnosis not present

## 2018-04-09 DIAGNOSIS — T451X5A Adverse effect of antineoplastic and immunosuppressive drugs, initial encounter: Secondary | ICD-10-CM | POA: Diagnosis not present

## 2018-04-09 DIAGNOSIS — D6481 Anemia due to antineoplastic chemotherapy: Secondary | ICD-10-CM | POA: Diagnosis not present

## 2018-04-09 MED ORDER — HEPARIN SOD (PORK) LOCK FLUSH 100 UNIT/ML IV SOLN
500.0000 [IU] | Freq: Once | INTRAVENOUS | Status: AC | PRN
  Administered 2018-04-09: 500 [IU]
  Filled 2018-04-09: qty 5

## 2018-04-09 MED ORDER — SODIUM CHLORIDE 0.9% FLUSH
10.0000 mL | Freq: Once | INTRAVENOUS | Status: AC | PRN
  Administered 2018-04-09: 10 mL
  Filled 2018-04-09: qty 10

## 2018-04-09 MED ORDER — SODIUM CHLORIDE 0.9 % IV SOLN
Freq: Once | INTRAVENOUS | Status: AC
  Administered 2018-04-09: 10:00:00 via INTRAVENOUS
  Filled 2018-04-09: qty 250

## 2018-04-09 NOTE — Patient Instructions (Signed)
Dehydration, Adult  Dehydration is when there is not enough fluid or water in your body. This happens when you lose more fluids than you take in. Dehydration can range from mild to very bad. It should be treated right away to keep it from getting very bad. Symptoms of mild dehydration may include:  Thirst.  Dry lips.  Slightly dry mouth.  Dry, warm skin.  Dizziness. Symptoms of moderate dehydration may include:  Very dry mouth.  Muscle cramps.  Dark pee (urine). Pee may be the color of tea.  Your body making less pee.  Your eyes making fewer tears.  Heartbeat that is uneven or faster than normal (palpitations).  Headache.  Light-headedness, especially when you stand up from sitting.  Fainting (syncope). Symptoms of very bad dehydration may include:  Changes in skin, such as: ? Cold and clammy skin. ? Blotchy (mottled) or pale skin. ? Skin that does not quickly return to normal after being lightly pinched and let go (poor skin turgor).  Changes in body fluids, such as: ? Feeling very thirsty. ? Your eyes making fewer tears. ? Not sweating when body temperature is high, such as in hot weather. ? Your body making very little pee.  Changes in vital signs, such as: ? Weak pulse. ? Pulse that is more than 100 beats a minute when you are sitting still. ? Fast breathing. ? Low blood pressure.  Other changes, such as: ? Sunken eyes. ? Cold hands and feet. ? Confusion. ? Lack of energy (lethargy). ? Trouble waking up from sleep. ? Short-term weight loss. ? Unconsciousness. Follow these instructions at home:   If told by your doctor, drink an ORS: ? Make an ORS by using instructions on the package. ? Start by drinking small amounts, about  cup (120 mL) every 5-10 minutes. ? Slowly drink more until you have had the amount that your doctor said to have.  Drink enough clear fluid to keep your pee clear or pale yellow. If you were told to drink an ORS, finish the  ORS first, then start slowly drinking clear fluids. Drink fluids such as: ? Water. Do not drink only water by itself. Doing that can make the salt (sodium) level in your body get too low (hyponatremia). ? Ice chips. ? Fruit juice that you have added water to (diluted). ? Low-calorie sports drinks.  Avoid: ? Alcohol. ? Drinks that have a lot of sugar. These include high-calorie sports drinks, fruit juice that does not have water added, and soda. ? Caffeine. ? Foods that are greasy or have a lot of fat or sugar.  Take over-the-counter and prescription medicines only as told by your doctor.  Do not take salt tablets. Doing that can make the salt level in your body get too high (hypernatremia).  Eat foods that have minerals (electrolytes). Examples include bananas, oranges, potatoes, tomatoes, and spinach.  Keep all follow-up visits as told by your doctor. This is important. Contact a doctor if:  You have belly (abdominal) pain that: ? Gets worse. ? Stays in one area (localizes).  You have a rash.  You have a stiff neck.  You get angry or annoyed more easily than normal (irritability).  You are more sleepy than normal.  You have a harder time waking up than normal.  You feel: ? Weak. ? Dizzy. ? Very thirsty.  You have peed (urinated) only a small amount of very dark pee during 6-8 hours. Get help right away if:  You have   symptoms of very bad dehydration.  You cannot drink fluids without throwing up (vomiting).  Your symptoms get worse with treatment.  You have a fever.  You have a very bad headache.  You are throwing up or having watery poop (diarrhea) and it: ? Gets worse. ? Does not go away.  You have blood or something green (bile) in your throw-up.  You have blood in your poop (stool). This may cause poop to look black and tarry.  You have not peed in 6-8 hours.  You pass out (faint).  Your heart rate when you are sitting still is more than 100 beats a  minute.  You have trouble breathing. This information is not intended to replace advice given to you by your health care provider. Make sure you discuss any questions you have with your health care provider. Document Released: 10/28/2008 Document Revised: 07/22/2015 Document Reviewed: 02/25/2015 Elsevier Interactive Patient Education  2019 [employer]  

## 2018-04-11 ENCOUNTER — Ambulatory Visit: Payer: Medicare Other

## 2018-04-11 ENCOUNTER — Inpatient Hospital Stay: Payer: Medicare Other

## 2018-04-14 ENCOUNTER — Inpatient Hospital Stay: Payer: Medicare Other

## 2018-04-14 ENCOUNTER — Inpatient Hospital Stay (HOSPITAL_BASED_OUTPATIENT_CLINIC_OR_DEPARTMENT_OTHER): Payer: Medicare Other | Admitting: Hematology

## 2018-04-14 ENCOUNTER — Other Ambulatory Visit: Payer: Self-pay

## 2018-04-14 ENCOUNTER — Encounter: Payer: Self-pay | Admitting: Hematology

## 2018-04-14 VITALS — BP 151/87 | HR 70 | Temp 97.9°F | Resp 20 | Wt 237.0 lb

## 2018-04-14 DIAGNOSIS — C8198 Hodgkin lymphoma, unspecified, lymph nodes of multiple sites: Secondary | ICD-10-CM | POA: Diagnosis not present

## 2018-04-14 DIAGNOSIS — I471 Supraventricular tachycardia: Secondary | ICD-10-CM | POA: Diagnosis not present

## 2018-04-14 DIAGNOSIS — Z5111 Encounter for antineoplastic chemotherapy: Secondary | ICD-10-CM | POA: Diagnosis not present

## 2018-04-14 DIAGNOSIS — T451X5A Adverse effect of antineoplastic and immunosuppressive drugs, initial encounter: Secondary | ICD-10-CM

## 2018-04-14 DIAGNOSIS — F32A Depression, unspecified: Secondary | ICD-10-CM

## 2018-04-14 DIAGNOSIS — C8190 Hodgkin lymphoma, unspecified, unspecified site: Secondary | ICD-10-CM

## 2018-04-14 DIAGNOSIS — F329 Major depressive disorder, single episode, unspecified: Secondary | ICD-10-CM

## 2018-04-14 DIAGNOSIS — K047 Periapical abscess without sinus: Secondary | ICD-10-CM

## 2018-04-14 DIAGNOSIS — I700 Atherosclerosis of aorta: Secondary | ICD-10-CM | POA: Diagnosis not present

## 2018-04-14 DIAGNOSIS — D701 Agranulocytosis secondary to cancer chemotherapy: Secondary | ICD-10-CM | POA: Diagnosis not present

## 2018-04-14 DIAGNOSIS — E871 Hypo-osmolality and hyponatremia: Secondary | ICD-10-CM

## 2018-04-14 DIAGNOSIS — E46 Unspecified protein-calorie malnutrition: Secondary | ICD-10-CM

## 2018-04-14 DIAGNOSIS — D6481 Anemia due to antineoplastic chemotherapy: Secondary | ICD-10-CM | POA: Diagnosis not present

## 2018-04-14 DIAGNOSIS — Z79899 Other long term (current) drug therapy: Secondary | ICD-10-CM

## 2018-04-14 DIAGNOSIS — K5900 Constipation, unspecified: Secondary | ICD-10-CM | POA: Diagnosis not present

## 2018-04-14 DIAGNOSIS — Z7982 Long term (current) use of aspirin: Secondary | ICD-10-CM

## 2018-04-14 LAB — CBC WITH DIFFERENTIAL (CANCER CENTER ONLY)
Abs Immature Granulocytes: 0.17 10*3/uL — ABNORMAL HIGH (ref 0.00–0.07)
BASOS ABS: 0 10*3/uL (ref 0.0–0.1)
Basophils Relative: 1 %
Eosinophils Absolute: 0 10*3/uL (ref 0.0–0.5)
Eosinophils Relative: 1 %
HCT: 30 % — ABNORMAL LOW (ref 39.0–52.0)
Hemoglobin: 9.3 g/dL — ABNORMAL LOW (ref 13.0–17.0)
Immature Granulocytes: 7 %
Lymphocytes Relative: 28 %
Lymphs Abs: 0.7 10*3/uL (ref 0.7–4.0)
MCH: 26.1 pg (ref 26.0–34.0)
MCHC: 31 g/dL (ref 30.0–36.0)
MCV: 84 fL (ref 80.0–100.0)
Monocytes Absolute: 0.8 10*3/uL (ref 0.1–1.0)
Monocytes Relative: 32 %
Neutro Abs: 0.8 10*3/uL — ABNORMAL LOW (ref 1.7–7.7)
Neutrophils Relative %: 31 %
Platelet Count: 300 10*3/uL (ref 150–400)
RBC: 3.57 MIL/uL — ABNORMAL LOW (ref 4.22–5.81)
RDW: 18 % — ABNORMAL HIGH (ref 11.5–15.5)
WBC Count: 2.6 10*3/uL — ABNORMAL LOW (ref 4.0–10.5)
nRBC: 0.8 % — ABNORMAL HIGH (ref 0.0–0.2)

## 2018-04-14 LAB — CMP (CANCER CENTER ONLY)
ALT: 27 U/L (ref 0–44)
AST: 17 U/L (ref 15–41)
Albumin: 3.6 g/dL (ref 3.5–5.0)
Alkaline Phosphatase: 92 U/L (ref 38–126)
Anion gap: 11 (ref 5–15)
BUN: 12 mg/dL (ref 8–23)
CO2: 26 mmol/L (ref 22–32)
Calcium: 9 mg/dL (ref 8.9–10.3)
Chloride: 95 mmol/L — ABNORMAL LOW (ref 98–111)
Creatinine: 0.83 mg/dL (ref 0.61–1.24)
GFR, Est AFR Am: >60 mL/min (ref >60)
GFR, Estimated: >60 mL/min (ref >60)
Glucose, Bld: 140 mg/dL — ABNORMAL HIGH (ref 70–99)
Potassium: 4.3 mmol/L (ref 3.5–5.1)
Sodium: 132 mmol/L — ABNORMAL LOW (ref 135–145)
Total Bilirubin: 0.7 mg/dL (ref 0.3–1.2)
Total Protein: 6.3 g/dL — ABNORMAL LOW (ref 6.5–8.1)

## 2018-04-14 LAB — MAGNESIUM: Magnesium: 1.7 mg/dL (ref 1.7–2.4)

## 2018-04-14 NOTE — Progress Notes (Signed)
[place] OFFICE PROGRESS NOTE  Patient Care Team: Tysinger, Camelia Eng, PA-C as PCP - General (Family Medicine) Nahser, Wonda Cheng, MD as PCP - Cardiology (Cardiology) Constance Haw, MD as PCP - Electrophysiology (Cardiology)  HEME/ONC OVERVIEW: 1. Stage III classic Hodgkin lymphoma, subtype NOS due to limited specimen -Bx-proven R cervical LN involvement in 09/2017; PET in 11/2017 showed cervical, mediastinal and upper abdominal LN involement; treatment delayed due to pt traveling on a cruise after diagnosis -11/2017 - present: ABVD -12/2017: PET after 2 cycles of ABVD showed interval CR  -Chemotherapy delayed due to SVT requiring ablation in 01/2018 -01/2018 - 03/2018: AVD x two and a half cycles; patient declined further treatment due to poor tolerance   2. Port placement in 11/2017   CHEMOTHERAPY REGIMEN:  11/26/2017 - 01/07/2018: ABVD x 2 cycles, CR  02/04/2018 - 04/01/2018: AVD x two and a half cycles; patient declined further treatment due to poor tolerance   ASSESSMENT & PLAN:   Stage III classic Hodgkin lymphoma, favorable risk  -S/p two and a half cycles of AVD; C5D14 AVD scheduled on 04/15/2018 -Patient discussed with his wife at length regarding his ability to tolerate further chemotherapy; after lengthy discussion, he elected to forego any additional treatment due to poor tolerance, including decreased PO intake, fatigue and depression; furthermore, he was very concerned about the ongoing COVID-19 outbreak, his risk of contracting the infection and possibly developing severe complications -I have had multiple discussion with the patient regarding the risk of increased disease recurrence with truncated treatment regimen; I reinforced the concept today, and patient patient expressed understanding -I have ordered PET in the end of 04/2018 assessed disease response -PRN anti-emetics: Zofran, Compazine, and Ativan  Chemotherapy-associated  leukopenia -Secondary to chemotherapy -WBC 2.6k with ANC 800, stable -Patient denies any symptoms of infection -We will monitor for now  Chemotherapy-associated anemia -Secondary to chemotherapy -Hgb 9.3, stable -Patient denies any symptom of bleeding -We will monitor for now  Hyponatremia -Secondary to poor p.o. intake -Na 132, stable  -Patient had initially been set up for supportive IV fluid hydration, but elected to cancel his infusions due to his concern about being exposed to COVID-19 -I counseled the patient on the importance of maintaining adequate hydration, and instructed him to call us back if he is unable to maintain adequate intake  Malnutrition -Secondary to chemotherapy -Weight overall stable since last visit -I encouraged patient to continue nutritional supplements  Depression -Secondary to chemotherapy -Cymbalta started in 02/2018 -Patient reports that his mood is overall unchanged, and denies any SI/HI -I discussed above, patient has chosen to stop his treatment due to poor tolerance -If his mood does not improve, we can consider refer him to psychiatry for further management  Dental abscess -Patient currently denies any recurrent or worsening dental pain -On PRN amoxicillin, prescribed by his dentist -Once his immune system recovers in the next few weeks, he may return to his dentist for any extraction as needed  Orders Placed This Encounter  Procedures  . NM PET Image Restag (PS) Skull Base To Thigh    Standing Status:   Future    Standing Expiration Date:   04/14/2019    Order Specific Question:   ** REASON FOR EXAM (FREE TEXT)    Answer:   Hodgkin lymphoma post-chemo, assess response    Order Specific Question:   If indicated for the ordered procedure, I authorize the administration of a radiopharmaceutical per Radiology protocol    Answer:  Yes    Order Specific Question:   Preferred imaging location?    Answer:   [facility]    Order  Specific Question:   Radiology Contrast Protocol - do NOT remove file path    Answer:   [email or web address]  . CBC with Differential ([place] Only)    Standing Status:   Future    Standing Expiration Date:   05/19/2019  . CMP ([place] only)    Standing Status:   Future    Standing Expiration Date:   05/19/2019  . Lactate dehydrogenase    Standing Status:   Future    Standing Expiration Date:   05/19/2019  . Magnesium    Standing Status:   Future    Standing Expiration Date:   05/19/2019  . CBC with Differential ([facility] Only)    Standing Status:   Future    Standing Expiration Date:   05/19/2019  . CMP ([facility] only)    Standing Status:   Future    Standing Expiration Date:   05/19/2019  . Lactate dehydrogenase    Standing Status:   Future    Standing Expiration Date:   05/19/2019    All questions were answered. The patient knows to call the clinic with any problems, questions or concerns. No barriers to learning was detected.  Return in ~4 weeks for labs, port flush, PET results and clinic appointment.  [name], MD 04/14/2018 10:26 AM  CHIEF COMPLAINT: "I am done with treatment"  INTERVAL HISTORY: Bradley Novak returns to clinic for follow-up of Hodgkin lymphoma on AVD.  He was initially scheduled for C5D14 of AVD on 3/31 2020.  However, he has been discussing with his wife regarding his poor tolerance of chemotherapy since resuming treatment in 01/2018, including persistent fatigue, depression, and malnutrition.  He feels that his mood has been down and very irritable, and does not like how it is impacting his relationship with his family.  Furthermore, he is very concerned about the ongoing COVID-19 outbreak, as well as he is risk of contracting the infection and possibly developing severe complications.  He is also frustrated that his family can come to visit him or that his wife can accompany him during treatment due to the travel and clinical  restrictions since the outbreak, respectively.  He had a very lengthy discussion with his wife over the past weekend, and ultimately decided to forego any additional treatment at this time.  He denies any SI/HI.  SUMMARY OF ONCOLOGIC HISTORY:   Hodgkin lymphoma ([place])   08/29/2017 Initial Diagnosis    Hodgkin lymphoma ([place])    10/07/2017 Imaging    CT neck w/ contrast: Enlarged nodes in the right jugular chain, posterior triangle, and supraclavicular fossa. The pattern is most concerning for lymphoma; multiple nodes are superficial and amenable to biopsy.  CT CAP w/ contrast: 1. Right supraclavicular and subcarinal adenopathy is identified. Differential considerations include lymphoproliferative disorder, metastatic adenopathy, granulomatous inflammation/infection or reactive adenopathy. Consider further evaluation with tissue sampling. 2. No acute findings, mass or adenopathy within the abdomen. 3. Lungs are clear.  No evidence for pneumonia.  4.  Aortic Atherosclerosis (ICD10-I70.0).    10/25/2017 Procedure    US-guided R cervical LN biopsy    10/25/2017 Pathology Results    (Accession: [id number]) Lymph node, needle/core biopsy, Right Cervical - CLASSIC HODGKIN LYMPHOMA - SEE COMMENT - CD30 (1%) - Unable to subtype due to limited sample specimen    11/12/2017 Cancer Staging  Staging form: Hodgkin and Non-Hodgkin Lymphoma, AJCC 8th Edition - Clinical stage from 11/12/2017: Stage II (Hodgkin lymphoma, A - Asymptomatic) - Signed by [name], MD on 11/12/2017    11/21/2017 -  Chemotherapy    The patient had DOXOrubicin (ADRIAMYCIN) chemo injection 60 mg, 25 mg/m2 = 60 mg, Intravenous,  Once, 0 of 4 cycles palonosetron (ALOXI) injection 0.25 mg, 0.25 mg, Intravenous,  Once, 0 of 4 cycles bleomycin (BLEOCIN) 24 Units in sodium chloride 0.9 % 50 mL chemo infusion, 10 Units/m2 = 24 Units, Intravenous,  Once, 0 of 4 cycles dacarbazine (DTIC) 910 mg in sodium chloride 0.9 % 250 mL chemo  infusion, 375 mg/m2 = 910 mg, Intravenous,  Once, 0 of 4 cycles vinBLAStine (VELBAN) 14.6 mg in sodium chloride 0.9 % 50 mL chemo infusion, 6 mg/m2 = 14.6 mg, Intravenous, Once, 0 of 4 cycles fosaprepitant (EMEND) 150 mg, dexamethasone (DECADRON) 12 mg in sodium chloride 0.9 % 145 mL IVPB, , Intravenous,  Once, 0 of 4 cycles  for chemotherapy treatment.     11/21/2017 Imaging    PET: IMPRESSION: 1. Multiple relatively small hypermetabolic lymph nodes in the RIGHT neck, mediastinum and limited involvement of the upper abdomen. Lymph node activity is high (greater than liver, [place] 4). 2. The most intense enlarged lymph node is anterior to the sternocleidomastoid muscle on the RIGHT. There are multiple assessable lymph nodes in the RIGHT cervical chain. 3. Minimal hypermetabolic adenopathy in the upper abdomen. No pelvic or inguinal metastatic adenopathy. 4. Normal spleen and bone marrow. 5. Single focus of metabolic activity in the sigmoid colon. Recommend screening colonoscopy if patient is not current for such.    01/10/2018 Imaging    PET (after 2 cycles of ABVD) IMPRESSION: 1. Interval complete response to therapy. No abnormal areas of persistent hypermetabolic adenopathy identified. 2.  Aortic Atherosclerosis (ICD10-I70.0). 3. Multi vessel coronary artery atherosclerotic calcifications.     REVIEW OF SYSTEMS:   Constitutional: ( - ) fevers, ( - )  chills , ( - ) night sweats Eyes: ( - ) blurriness of vision, ( - ) double vision, ( - ) watery eyes Ears, nose, mouth, throat, and face: ( - ) mucositis, ( - ) sore throat Respiratory: ( - ) cough, ( - ) dyspnea, ( - ) wheezes Cardiovascular: ( - ) palpitation, ( - ) chest discomfort, ( - ) lower extremity swelling Gastrointestinal:  ( - ) nausea, ( - ) heartburn, ( - ) change in bowel habits Skin: ( - ) abnormal skin rashes Lymphatics: ( - ) new lymphadenopathy, ( - ) easy bruising Neurological: ( - ) numbness, ( - ) tingling, ( - )  new weaknesses Behavioral/Psych: ( - ) mood change, ( - ) new changes  All other systems were reviewed with the patient and are negative.  I have reviewed the past medical history, past surgical history, social history and family history with the patient and they are unchanged from previous note.  ALLERGIES:  has No Known Allergies.  MEDICATIONS:  Current Outpatient Medications  Medication Sig Dispense Refill  . amoxicillin (AMOXIL) 500 MG capsule Take 500 mg by mouth 2 (two) times daily.    Marland Kitchen aspirin EC 81 MG tablet Take 1 tablet (81 mg total) by mouth daily. 90 tablet 3  . atorvastatin (LIPITOR) 20 MG tablet TAKE 1 TABLET BY MOUTH DAILY (Patient taking differently: Take 20 mg by mouth daily. ) 90 tablet 2  . Cholecalciferol (VITAMIN D PO) Take 1 capsule  by mouth at bedtime.    . diclofenac (VOLTAREN) 75 MG EC tablet Take 75 mg by mouth daily.    Marland Kitchen diltiazem (CARDIZEM CD) 120 MG 24 hr capsule Take 1 capsule (120 mg total) by mouth daily. 90 capsule 1  . diltiazem (CARDIZEM) 30 MG tablet Take 30 mg by mouth daily as needed (RAPID HEART RATE).    Marland Kitchen HYDROcodone-acetaminophen (NORCO) 7.5-325 MG tablet Take 1 tablet by mouth every 6 (six) hours as needed for moderate pain. 12 tablet 0  . ibuprofen (ADVIL,MOTRIN) 200 MG tablet Take 400 mg by mouth daily as needed for moderate pain.    Marland Kitchen LORazepam (ATIVAN) 0.5 MG tablet Take 1 tablet (0.5 mg total) by mouth every 6 (six) hours as needed (Nausea or vomiting). 30 tablet 0  . metoCLOPramide (REGLAN) 10 MG tablet Take 1 tablet (10 mg total) by mouth 3 (three) times daily before meals for 14 days. 42 tablet 0  . metoprolol succinate (TOPROL XL) 100 MG 24 hr tablet Take 1 tablet (100 mg total) by mouth 2 (two) times daily. Take with or immediately following a meal. 180 tablet 3  . nitroGLYCERIN (NITROSTAT) 0.3 MG SL tablet Place 1 tablet (0.3 mg total) under the tongue every 5 (five) minutes as needed for chest pain. 30 tablet 0  . potassium chloride SA  (K-DUR,KLOR-CON) 20 MEQ tablet Take 1 tablet (20 mEq total) by mouth 2 (two) times daily. 180 tablet 0  . prochlorperazine (COMPAZINE) 10 MG tablet Take 1 tablet (10 mg total) by mouth every 6 (six) hours as needed (Nausea or vomiting). 30 tablet 1  . ranitidine (ZANTAC) 150 MG tablet Take 150 mg by mouth daily as needed for heartburn.    . vitamin B-12 (CYANOCOBALAMIN) 100 MCG tablet Take 100 mcg by mouth daily.    . DULoxetine (CYMBALTA) 30 MG capsule Take 1 capsule (30 mg total) by mouth daily for 7 days, THEN 2 capsules (60 mg total) daily for 30 days. 67 capsule 5   No current facility-administered medications for this visit.     PHYSICAL EXAMINATION: ECOG PERFORMANCE STATUS: 1 - Symptomatic but completely ambulatory  Today's Vitals   04/14/18 0940  BP: (!) 151/87  Pulse: 70  Resp: 20  Temp: 97.9 F (36.6 C)  TempSrc: Oral  SpO2: 100%  Weight: 237 lb (107.5 kg)  PainSc: 0-No pain   Body mass index is 33.05 kg/m.  Filed Weights   04/14/18 0940  Weight: 237 lb (107.5 kg)    GENERAL: alert, no distress and comfortable SKIN: skin color, texture, turgor are normal, no rashes or significant lesions EYES: conjunctiva are pink and non-injected, sclera clear OROPHARYNX: no exudate, no erythema; lips, buccal mucosa, and tongue normal  NECK: supple, non-tender LYMPH:  no palpable lymphadenopathy in the cervical LUNGS: clear to auscultation with normal breathing effort HEART: regular rate & rhythm and no murmurs and no lower extremity edema ABDOMEN: soft, non-tender, non-distended, normal bowel sounds Musculoskeletal: no cyanosis of digits and no clubbing  PSYCH: alert & oriented x 3, fluent speech NEURO: no focal motor/sensory deficits  LABORATORY DATA:  I have reviewed the data as listed    Component Value Date/Time   NA 131 (L) 03/31/2018 1040   NA 141 08/29/2017 1155   K 3.9 03/31/2018 1040   CL 95 (L) 03/31/2018 1040   CO2 25 03/31/2018 1040   GLUCOSE 133 (H)  03/31/2018 1040   BUN 13 03/31/2018 1040   BUN 16 08/29/2017 1155  CREATININE 0.85 03/31/2018 1040   CREATININE 0.83 09/27/2014 0001   CALCIUM 9.0 03/31/2018 1040   PROT 6.4 (L) 03/31/2018 1040   PROT 7.4 08/29/2017 1155   ALBUMIN 3.8 03/31/2018 1040   ALBUMIN 4.1 08/29/2017 1155   AST 17 03/31/2018 1040   ALT 26 03/31/2018 1040   ALKPHOS 93 03/31/2018 1040   BILITOT 0.7 03/31/2018 1040   GFRNONAA >60 03/31/2018 1040   GFRAA >60 03/31/2018 1040    No results found for: SPEP, UPEP  Lab Results  Component Value Date   WBC 2.1 (L) 03/31/2018   NEUTROABS 0.5 (L) 03/31/2018   HGB 9.7 (L) 03/31/2018   HCT 30.5 (L) 03/31/2018   MCV 84.7 03/31/2018   PLT 231 03/31/2018      Chemistry      Component Value Date/Time   NA 131 (L) 03/31/2018 1040   NA 141 08/29/2017 1155   K 3.9 03/31/2018 1040   CL 95 (L) 03/31/2018 1040   CO2 25 03/31/2018 1040   BUN 13 03/31/2018 1040   BUN 16 08/29/2017 1155   CREATININE 0.85 03/31/2018 1040   CREATININE 0.83 09/27/2014 0001      Component Value Date/Time   CALCIUM 9.0 03/31/2018 1040   ALKPHOS 93 03/31/2018 1040   AST 17 03/31/2018 1040   ALT 26 03/31/2018 1040   BILITOT 0.7 03/31/2018 1040

## 2018-04-15 ENCOUNTER — Inpatient Hospital Stay: Payer: Medicare Other

## 2018-04-21 ENCOUNTER — Other Ambulatory Visit: Payer: Medicare Other

## 2018-04-21 ENCOUNTER — Ambulatory Visit: Payer: Medicare Other | Admitting: Hematology

## 2018-04-21 ENCOUNTER — Ambulatory Visit: Payer: Medicare Other

## 2018-04-23 ENCOUNTER — Ambulatory Visit: Payer: Medicare Other

## 2018-04-25 ENCOUNTER — Ambulatory Visit: Payer: Medicare Other

## 2018-05-02 ENCOUNTER — Encounter: Payer: Self-pay | Admitting: *Deleted

## 2018-05-14 ENCOUNTER — Other Ambulatory Visit: Payer: Self-pay

## 2018-05-14 DIAGNOSIS — E876 Hypokalemia: Secondary | ICD-10-CM

## 2018-05-14 MED ORDER — POTASSIUM CHLORIDE CRYS ER 20 MEQ PO TBCR: 20.0000 meq | EXTENDED_RELEASE_TABLET | Freq: Two times a day (BID) | ORAL | 0 refills | Status: DC

## 2018-05-15 ENCOUNTER — Ambulatory Visit (HOSPITAL_COMMUNITY): Payer: Medicare Other

## 2018-05-16 ENCOUNTER — Other Ambulatory Visit: Payer: Self-pay

## 2018-05-16 ENCOUNTER — Ambulatory Visit (HOSPITAL_COMMUNITY)
Admission: RE | Admit: 2018-05-16 | Discharge: 2018-05-16 | Disposition: A | Discharge status: Home / Self Care | Payer: Medicare Other | Source: Ambulatory Visit | Attending: Hematology | Admitting: Hematology

## 2018-05-16 DIAGNOSIS — C8190 Hodgkin lymphoma, unspecified, unspecified site: Secondary | ICD-10-CM | POA: Diagnosis not present

## 2018-05-16 MED ORDER — FLUDEOXYGLUCOSE F - 18 (FDG) INJECTION
11.8000 | Freq: Once | INTRAVENOUS | Status: AC
  Administered 2018-05-16: 11.8 via INTRAVENOUS

## 2018-05-19 ENCOUNTER — Inpatient Hospital Stay: Payer: Medicare Other | Attending: Hematology

## 2018-05-19 ENCOUNTER — Other Ambulatory Visit: Payer: Self-pay | Admitting: Hematology

## 2018-05-19 ENCOUNTER — Telehealth: Payer: Self-pay | Admitting: Hematology

## 2018-05-19 ENCOUNTER — Inpatient Hospital Stay: Payer: Medicare Other

## 2018-05-19 ENCOUNTER — Inpatient Hospital Stay (HOSPITAL_BASED_OUTPATIENT_CLINIC_OR_DEPARTMENT_OTHER): Payer: Medicare Other | Admitting: Hematology

## 2018-05-19 ENCOUNTER — Encounter: Payer: Self-pay | Admitting: Hematology

## 2018-05-19 ENCOUNTER — Other Ambulatory Visit: Payer: Self-pay

## 2018-05-19 VITALS — BP 145/91 | HR 69 | Resp 20 | Ht 71.0 in | Wt 248.0 lb

## 2018-05-19 DIAGNOSIS — K047 Periapical abscess without sinus: Secondary | ICD-10-CM

## 2018-05-19 DIAGNOSIS — Z7982 Long term (current) use of aspirin: Secondary | ICD-10-CM | POA: Insufficient documentation

## 2018-05-19 DIAGNOSIS — R933 Abnormal findings on diagnostic imaging of other parts of digestive tract: Secondary | ICD-10-CM | POA: Diagnosis not present

## 2018-05-19 DIAGNOSIS — F329 Major depressive disorder, single episode, unspecified: Secondary | ICD-10-CM | POA: Diagnosis not present

## 2018-05-19 DIAGNOSIS — I2510 Atherosclerotic heart disease of native coronary artery without angina pectoris: Secondary | ICD-10-CM | POA: Diagnosis not present

## 2018-05-19 DIAGNOSIS — R7401 Elevation of levels of liver transaminase levels: Secondary | ICD-10-CM

## 2018-05-19 DIAGNOSIS — N419 Inflammatory disease of prostate, unspecified: Secondary | ICD-10-CM

## 2018-05-19 DIAGNOSIS — R740 Nonspecific elevation of levels of transaminase and lactic acid dehydrogenase [LDH]: Secondary | ICD-10-CM | POA: Diagnosis not present

## 2018-05-19 DIAGNOSIS — Z79899 Other long term (current) drug therapy: Secondary | ICD-10-CM | POA: Insufficient documentation

## 2018-05-19 DIAGNOSIS — R350 Frequency of micturition: Secondary | ICD-10-CM | POA: Diagnosis not present

## 2018-05-19 DIAGNOSIS — T451X5A Adverse effect of antineoplastic and immunosuppressive drugs, initial encounter: Secondary | ICD-10-CM | POA: Insufficient documentation

## 2018-05-19 DIAGNOSIS — C8190 Hodgkin lymphoma, unspecified, unspecified site: Secondary | ICD-10-CM

## 2018-05-19 DIAGNOSIS — Z9221 Personal history of antineoplastic chemotherapy: Secondary | ICD-10-CM | POA: Diagnosis not present

## 2018-05-19 DIAGNOSIS — F32A Depression, unspecified: Secondary | ICD-10-CM

## 2018-05-19 DIAGNOSIS — R948 Abnormal results of function studies of other organs and systems: Secondary | ICD-10-CM

## 2018-05-19 DIAGNOSIS — I700 Atherosclerosis of aorta: Secondary | ICD-10-CM | POA: Insufficient documentation

## 2018-05-19 DIAGNOSIS — D6481 Anemia due to antineoplastic chemotherapy: Secondary | ICD-10-CM | POA: Insufficient documentation

## 2018-05-19 LAB — LACTATE DEHYDROGENASE: LDH: 190 U/L (ref 98–192)

## 2018-05-19 LAB — CMP (CANCER CENTER ONLY)
ALT: 71 U/L — ABNORMAL HIGH (ref 0–44)
AST: 43 U/L — ABNORMAL HIGH (ref 15–41)
Albumin: 3.6 g/dL (ref 3.5–5.0)
Alkaline Phosphatase: 145 U/L — ABNORMAL HIGH (ref 38–126)
Anion gap: 9 (ref 5–15)
BUN: 12 mg/dL (ref 8–23)
CO2: 27 mmol/L (ref 22–32)
Calcium: 9.7 mg/dL (ref 8.9–10.3)
Chloride: 101 mmol/L (ref 98–111)
Creatinine: 0.87 mg/dL (ref 0.61–1.24)
GFR, Est AFR Am: >60 mL/min (ref >60)
GFR, Estimated: >60 mL/min (ref >60)
Glucose, Bld: 116 mg/dL — ABNORMAL HIGH (ref 70–99)
Potassium: 3.8 mmol/L (ref 3.5–5.1)
Sodium: 137 mmol/L (ref 135–145)
Total Bilirubin: 0.4 mg/dL (ref 0.3–1.2)
Total Protein: 7.1 g/dL (ref 6.5–8.1)

## 2018-05-19 LAB — URINALYSIS, COMPLETE (UACMP) WITH MICROSCOPIC
Bilirubin Urine: NEGATIVE
Glucose, UA: NEGATIVE mg/dL
Hgb urine dipstick: NEGATIVE
Ketones, ur: NEGATIVE mg/dL
Nitrite: NEGATIVE
Protein, ur: NEGATIVE mg/dL
Specific Gravity, Urine: 1.025 (ref 1.005–1.030)
pH: 6 (ref 5.0–8.0)

## 2018-05-19 LAB — CBC WITH DIFFERENTIAL (CANCER CENTER ONLY)
Abs Immature Granulocytes: 0.09 10*3/uL — ABNORMAL HIGH (ref 0.00–0.07)
Basophils Absolute: 0.1 10*3/uL (ref 0.0–0.1)
Basophils Relative: 1 %
Eosinophils Absolute: 0.3 10*3/uL (ref 0.0–0.5)
Eosinophils Relative: 3 %
HCT: 36.4 % — ABNORMAL LOW (ref 39.0–52.0)
Hemoglobin: 11.3 g/dL — ABNORMAL LOW (ref 13.0–17.0)
Immature Granulocytes: 1 %
Lymphocytes Relative: 27 %
Lymphs Abs: 2.6 10*3/uL (ref 0.7–4.0)
MCH: 27.1 pg (ref 26.0–34.0)
MCHC: 31 g/dL (ref 30.0–36.0)
MCV: 87.3 fL (ref 80.0–100.0)
Monocytes Absolute: 0.7 10*3/uL (ref 0.1–1.0)
Monocytes Relative: 7 %
Neutro Abs: 5.8 10*3/uL (ref 1.7–7.7)
Neutrophils Relative %: 61 %
Platelet Count: 243 10*3/uL (ref 150–400)
RBC: 4.17 MIL/uL — ABNORMAL LOW (ref 4.22–5.81)
RDW: 16.5 % — ABNORMAL HIGH (ref 11.5–15.5)
WBC Count: 9.7 10*3/uL (ref 4.0–10.5)
nRBC: 0 % (ref 0.0–0.2)

## 2018-05-19 LAB — GLUCOSE, CAPILLARY: Glucose-Capillary: 121 mg/dL — ABNORMAL HIGH (ref 70–99)

## 2018-05-19 MED ORDER — CIPROFLOXACIN HCL 500 MG PO TABS: 500.0000 mg | ORAL_TABLET | Freq: Two times a day (BID) | ORAL | 0 refills | Status: AC

## 2018-05-19 NOTE — Patient Instructions (Signed)

## 2018-05-19 NOTE — Telephone Encounter (Signed)
Appointments scheduled patient notified per 5/4 los

## 2018-05-19 NOTE — Progress Notes (Signed)
[place] OFFICE PROGRESS NOTE  Patient Care Team: Tysinger, Camelia Eng, PA-C as PCP - General (Family Medicine) Nahser, Wonda Cheng, MD as PCP - Cardiology (Cardiology) Constance Haw, MD as PCP - Electrophysiology (Cardiology)  HEME/ONC OVERVIEW: 1. Stage III classic Hodgkin lymphoma, subtype NOS due to limited specimen -Bx-proven R cervical LN involvement in 09/2017; PET in 11/2017 showed cervical, mediastinal and upper abdominal LN involement; treatment delayed due to pt traveling on a cruise after diagnosis -11/2017 - 01/2018: ABVD x 2 cycles  12/2017: PET after 2 cycles of ABVD showed interval CR  -Chemotherapy delayed due to SVT requiring ablation in 01/2018 -01/2018 - 03/2018: AVD x two and a half cycles; patient declined further treatment due to poor tolerance   05/2018: end-of treatment PET showed CR (0.8cm R cervical LN, Deauville 2; no marrow lesion)  2. Port placement in 11/2017   CHEMOTHERAPY REGIMEN:  11/26/2017 - 01/07/2018: ABVD x 2 cycles, CR  02/04/2018 - 04/01/2018: AVD x two and a half cycles; patient declined further treatment due to poor tolerance   ASSESSMENT & PLAN:   Stage III classic Hodgkin lymphoma, favorable risk  -I independently reviewed the radiologic images of recent PET, and agree with the findings documented -In summary, posttreatment PET in 05/2018 showed complete remission.  There were some scattered right cervical lymph nodes with normal size and minimal metabolic activity (Deauville 2), but no other suspicious lesion to suggest residual HL, and this is consistent with CR. There was an area of new focal FDG avidity (SUV 32.8) in the sigmoid colon as well as some new, prominent activity in the prostate gland (SUV 12.2), but given that they are new and not in areas typically seen with HL, they are likely unrelated to the HL.  -I discussed the imaging results in detail with the patient -I have ordered port removal, given the CR   -Based on CR, follow-up will be as below:  Labs and exam q3-87months x 1-2 years, then q6-16months until Year 3, and then annually  CT nec, chest, and AP at 6, 12 and 24 months following completion of therapy   Annual influenza vaccine  -Next scans due in mid-09/2018    Abnormal sigmoid colon uptake -Given that the FDG avidity was new and very high, this is suggestive of inflammatory etiology -Clinically, patient denies any abdominal pain, nausea, vomiting, diarrhea, hematochezia or melena -I have referred the patient to GI for further evaluation  Abnormal prostate uptake -PET showed new FDG avidity in the prostate gland, mostly in the peripheral zone, with some stranding around the prostate gland, suggesting prostatitis -Clinically, he reports increased urinary frequency for several weeks, but denies any fever, hematuria, or dysuria  -I have ordered UA and urine culture today -Given the suspicious PET findings, I have prescribed ciprofloxacin 500mg  BID x 14 days -I have also referred the patient to urology for further evaluation and management   Chemotherapy-associated anemia -Secondary to recent chemotherapy -Hgb 11.3, improving -Patient denies any symptom of bleeding -We will monitor for now  Transaminitis -LFT's mildly elevated today, new -Etiology unclear, possibly due to recent chemotherapy -PET scan showed no liver abnormality -We will monitor it for now   Dental abscess  -No symptoms of recurrent dental pain or swelling -Dental procedure on hold due to COVID-19 outbreak -Continue follow-up with dentistry when his dentist office resumes operation  Depression -His mood has improved significantly since stopping treatment  -Continue Cymbalta and continue follow-up with PCP  Orders Placed This Encounter  Procedures  . IR REMOVAL TUN ACCESS W/ PORT W/O FL MOD SED    Standing Status:   Future    Standing Expiration Date:   07/19/2019    Order Specific Question:    Reason for exam:    Answer:   Port removal post-chemo treatment    Order Specific Question:   Preferred Imaging Location?    Answer:   [facility]  . CBC with Differential ([place] Only)    Standing Status:   Future    Standing Expiration Date:   06/23/2019  . CMP ([place] only)    Standing Status:   Future    Standing Expiration Date:   06/23/2019  . Lactate dehydrogenase    Standing Status:   Future    Standing Expiration Date:   06/23/2019  . Ambulatory referral to Gastroenterology    Referral Priority:   Routine    Referral Type:   Consultation    Referral Reason:   Specialty Services Required    Referred to Provider:   Lavena Bullion, DO    Number of Visits Requested:   1  . Ambulatory referral to Urology    Referral Priority:   Routine    Referral Type:   Consultation    Referral Reason:   Specialty Services Required    Requested Specialty:   Urology    Number of Visits Requested:   1    All questions were answered. The patient knows to call the clinic with any problems, questions or concerns. No barriers to learning was detected.  A total of more than 40 minutes were spent face-to-face with the patient during this encounter and over half of that time was spent on counseling and coordination of care as outlined above.   Return in 2 months for labs, port flush (if port has not yet been removed) and clinic appt.   Bradley Men, MD 05/19/2018 10:45 AM  CHIEF COMPLAINT: "I am doing much better"  INTERVAL HISTORY: Bradley Novak returns to clinic for follow-up of Hodgkin lymphoma after recently completing chemotherapy in 03/2018.  Patient reports that since completing chemotherapy, he is energy level has improved significantly, and he is able to resume some extracurricular activities.  His appetite is improving, and he is gaining some weight.  He reports that he has had some intermittent increased urinary frequency for several weeks, but he denies any fever, flank  pain, hematuria, or dysuria.  He also denies any abdominal pain, nausea, vomiting, diarrhea, hematochezia, or melena.  SUMMARY OF ONCOLOGIC HISTORY:   Hodgkin lymphoma ([place])   08/29/2017 Initial Diagnosis    Hodgkin lymphoma ([place])    10/07/2017 Imaging    CT neck w/ contrast: Enlarged nodes in the right jugular chain, posterior triangle, and supraclavicular fossa. The pattern is most concerning for lymphoma; multiple nodes are superficial and amenable to biopsy.  CT CAP w/ contrast: 1. Right supraclavicular and subcarinal adenopathy is identified. Differential considerations include lymphoproliferative disorder, metastatic adenopathy, granulomatous inflammation/infection or reactive adenopathy. Consider further evaluation with tissue sampling. 2. No acute findings, mass or adenopathy within the abdomen. 3. Lungs are clear.  No evidence for pneumonia.  4.  Aortic Atherosclerosis (ICD10-I70.0).    10/25/2017 Procedure    US-guided R cervical LN biopsy    10/25/2017 Pathology Results    (Accession: [id number]) Lymph node, needle/core biopsy, Right Cervical - CLASSIC HODGKIN LYMPHOMA - SEE COMMENT - CD30 (1%) - Unable to subtype due to  limited sample specimen    11/12/2017 Cancer Staging    Staging form: Hodgkin and Non-Hodgkin Lymphoma, AJCC 8th Edition - Clinical stage from 11/12/2017: Stage II (Hodgkin lymphoma, A - Asymptomatic) - Signed by Bradley Men, MD on 11/12/2017    11/21/2017 -  Chemotherapy    The patient had DOXOrubicin (ADRIAMYCIN) chemo injection 60 mg, 25 mg/m2 = 60 mg, Intravenous,  Once, 0 of 4 cycles palonosetron (ALOXI) injection 0.25 mg, 0.25 mg, Intravenous,  Once, 0 of 4 cycles bleomycin (BLEOCIN) 24 Units in sodium chloride 0.9 % 50 mL chemo infusion, 10 Units/m2 = 24 Units, Intravenous,  Once, 0 of 4 cycles dacarbazine (DTIC) 910 mg in sodium chloride 0.9 % 250 mL chemo infusion, 375 mg/m2 = 910 mg, Intravenous,  Once, 0 of 4 cycles vinBLAStine (VELBAN) 14.6 mg  in sodium chloride 0.9 % 50 mL chemo infusion, 6 mg/m2 = 14.6 mg, Intravenous, Once, 0 of 4 cycles fosaprepitant (EMEND) 150 mg, dexamethasone (DECADRON) 12 mg in sodium chloride 0.9 % 145 mL IVPB, , Intravenous,  Once, 0 of 4 cycles  for chemotherapy treatment.     11/21/2017 Imaging    PET: IMPRESSION: 1. Multiple relatively small hypermetabolic lymph nodes in the RIGHT neck, mediastinum and limited involvement of the upper abdomen. Lymph node activity is high (greater than liver, Deauville 4). 2. The most intense enlarged lymph node is anterior to the sternocleidomastoid muscle on the RIGHT. There are multiple assessable lymph nodes in the RIGHT cervical chain. 3. Minimal hypermetabolic adenopathy in the upper abdomen. No pelvic or inguinal metastatic adenopathy. 4. Normal spleen and bone marrow. 5. Single focus of metabolic activity in the sigmoid colon. Recommend screening colonoscopy if patient is not current for such.    01/10/2018 Imaging    PET (after 2 cycles of ABVD) IMPRESSION: 1. Interval complete response to therapy. No abnormal areas of persistent hypermetabolic adenopathy identified. 2.  Aortic Atherosclerosis (ICD10-I70.0). 3. Multi vessel coronary artery atherosclerotic calcifications.    05/16/2018 Imaging    Post-treatment PET: IMPRESSION: 1. Small right cervical lymph nodes have only very low-grade activity, about half that of blood pool, and are small in size. Technically these are Deauville 2 as the lymph nodes have not completely resolved. 2. New focal hyperintensity in the sigmoid colon, maximum SUV 32.8 (Deauville 5). This was not present on 01/10/2018 and so presumably would represent physiologic activity, but the focal nature is unusual and a polyp is difficult to totally exclude. Correlate with colon screening history in determining whether further colon workup is warranted. 3. Prominent activity (Deauville 5) in the prostate gland, especially the  peripheral zone, which is new and is associated with new periprostatic stranding. Prostatitis is favored given this constellation of findings.     REVIEW OF SYSTEMS:   Constitutional: ( - ) fevers, ( - )  chills , ( - ) night sweats Eyes: ( - ) blurriness of vision, ( - ) double vision, ( - ) watery eyes Ears, nose, mouth, throat, and face: ( - ) mucositis, ( - ) sore throat Respiratory: ( - ) cough, ( - ) dyspnea, ( - ) wheezes Cardiovascular: ( - ) palpitation, ( - ) chest discomfort, ( - ) lower extremity swelling Gastrointestinal:  ( - ) nausea, ( - ) heartburn, ( - ) change in bowel habits Skin: ( - ) abnormal skin rashes Lymphatics: ( - ) new lymphadenopathy, ( - ) easy bruising Neurological: ( - ) numbness, ( - ) tingling, ( - )  new weaknesses Behavioral/Psych: ( - ) mood change, ( - ) new changes  All other systems were reviewed with the patient and are negative.  I have reviewed the past medical history, past surgical history, social history and family history with the patient and they are unchanged from previous note.  ALLERGIES:  has No Known Allergies.  MEDICATIONS:  Current Outpatient Medications  Medication Sig Dispense Refill  . amoxicillin (AMOXIL) 500 MG capsule Take 500 mg by mouth 2 (two) times daily.    Marland Kitchen aspirin EC 81 MG tablet Take 1 tablet (81 mg total) by mouth daily. 90 tablet 3  . atorvastatin (LIPITOR) 20 MG tablet TAKE 1 TABLET BY MOUTH DAILY (Patient taking differently: Take 20 mg by mouth daily. ) 90 tablet 2  . Cholecalciferol (VITAMIN D PO) Take 1 capsule by mouth at bedtime.    . ciprofloxacin (CIPRO) 500 MG tablet Take 1 tablet (500 mg total) by mouth 2 (two) times daily for 14 days. 28 tablet 0  . diclofenac (VOLTAREN) 75 MG EC tablet Take 75 mg by mouth daily.    Marland Kitchen diltiazem (CARDIZEM CD) 120 MG 24 hr capsule Take 1 capsule (120 mg total) by mouth daily. 90 capsule 1  . diltiazem (CARDIZEM) 30 MG tablet Take 30 mg by mouth daily as needed (RAPID  HEART RATE).    . DULoxetine (CYMBALTA) 30 MG capsule Take 1 capsule (30 mg total) by mouth daily for 7 days, THEN 2 capsules (60 mg total) daily for 30 days. 67 capsule 5  . HYDROcodone-acetaminophen (NORCO) 7.5-325 MG tablet Take 1 tablet by mouth every 6 (six) hours as needed for moderate pain. 12 tablet 0  . ibuprofen (ADVIL,MOTRIN) 200 MG tablet Take 400 mg by mouth daily as needed for moderate pain.    Marland Kitchen LORazepam (ATIVAN) 0.5 MG tablet Take 1 tablet (0.5 mg total) by mouth every 6 (six) hours as needed (Nausea or vomiting). 30 tablet 0  . metoCLOPramide (REGLAN) 10 MG tablet Take 1 tablet (10 mg total) by mouth 3 (three) times daily before meals for 14 days. 42 tablet 0  . metoprolol succinate (TOPROL XL) 100 MG 24 hr tablet Take 1 tablet (100 mg total) by mouth 2 (two) times daily. Take with or immediately following a meal. 180 tablet 3  . nitroGLYCERIN (NITROSTAT) 0.3 MG SL tablet Place 1 tablet (0.3 mg total) under the tongue every 5 (five) minutes as needed for chest pain. 30 tablet 0  . potassium chloride SA (K-DUR) 20 MEQ tablet Take 1 tablet (20 mEq total) by mouth 2 (two) times daily. 180 tablet 0  . prochlorperazine (COMPAZINE) 10 MG tablet Take 1 tablet (10 mg total) by mouth every 6 (six) hours as needed (Nausea or vomiting). 30 tablet 1  . ranitidine (ZANTAC) 150 MG tablet Take 150 mg by mouth daily as needed for heartburn.    . vitamin B-12 (CYANOCOBALAMIN) 100 MCG tablet Take 100 mcg by mouth daily.     No current facility-administered medications for this visit.     PHYSICAL EXAMINATION: ECOG PERFORMANCE STATUS: 1 - Symptomatic but completely ambulatory  There were no vitals filed for this visit. There is no height or weight on file to calculate BMI.  There were no vitals filed for this visit.  GENERAL: alert, no distress and comfortable SKIN: skin color, texture, turgor are normal, no rashes or significant lesions EYES: conjunctiva are pink and non-injected, sclera  clear OROPHARYNX: no exudate, no erythema; lips, buccal mucosa, and tongue  normal  NECK: supple, non-tender LYMPH:  no palpable lymphadenopathy in the cervical LUNGS: clear to auscultation with normal breathing effort HEART: regular rate & rhythm and no murmurs and no lower extremity edema ABDOMEN: soft, non-tender, non-distended, normal bowel sounds Musculoskeletal: no cyanosis of digits and no clubbing  PSYCH: alert & oriented x 3, fluent speech NEURO: no focal motor/sensory deficits  LABORATORY DATA:  I have reviewed the data as listed    Component Value Date/Time   NA 137 05/19/2018 0950   NA 141 08/29/2017 1155   K 3.8 05/19/2018 0950   CL 101 05/19/2018 0950   CO2 27 05/19/2018 0950   GLUCOSE 116 (H) 05/19/2018 0950   BUN 12 05/19/2018 0950   BUN 16 08/29/2017 1155   CREATININE 0.87 05/19/2018 0950   CREATININE 0.83 09/27/2014 0001   CALCIUM 9.7 05/19/2018 0950   PROT 7.1 05/19/2018 0950   PROT 7.4 08/29/2017 1155   ALBUMIN 3.6 05/19/2018 0950   ALBUMIN 4.1 08/29/2017 1155   AST 43 (H) 05/19/2018 0950   ALT 71 (H) 05/19/2018 0950   ALKPHOS 145 (H) 05/19/2018 0950   BILITOT 0.4 05/19/2018 0950   GFRNONAA >60 05/19/2018 0950   GFRAA >60 05/19/2018 0950    No results found for: SPEP, UPEP  Lab Results  Component Value Date   WBC 9.7 05/19/2018   NEUTROABS 5.8 05/19/2018   HGB 11.3 (L) 05/19/2018   HCT 36.4 (L) 05/19/2018   MCV 87.3 05/19/2018   PLT 243 05/19/2018      Chemistry      Component Value Date/Time   NA 137 05/19/2018 0950   NA 141 08/29/2017 1155   K 3.8 05/19/2018 0950   CL 101 05/19/2018 0950   CO2 27 05/19/2018 0950   BUN 12 05/19/2018 0950   BUN 16 08/29/2017 1155   CREATININE 0.87 05/19/2018 0950   CREATININE 0.83 09/27/2014 0001      Component Value Date/Time   CALCIUM 9.7 05/19/2018 0950   ALKPHOS 145 (H) 05/19/2018 0950   AST 43 (H) 05/19/2018 0950   ALT 71 (H) 05/19/2018 0950   BILITOT 0.4 05/19/2018 0950        RADIOGRAPHIC STUDIES: I have personally reviewed the radiological images as listed below and agreed with the findings in the report. Nm Pet Image Restag (ps) Skull Base To Thigh  Addendum Date: 05/19/2018   ADDENDUM REPORT: 05/19/2018 10:26 ADDENDUM: The original report was by Dr. Van Clines. The following addendum is by Dr. Van Clines: These results were discussed by telephone on 05/19/2018 at 10:23 am to Dr. Tish Novak. We discussed the small remaining right cervical lymph nodes, which are currently well below blood pool in activity, and are currently within normal size limits. This may well reflect complete response to therapy; a Deauville 2 level of nodal activity is often seen in normal patients. Electronically Signed   By: Van Clines M.D.   On: 05/19/2018 10:26   Result Date: 05/19/2018 CLINICAL DATA:  Subsequent treatment strategy for Hodgkin lymphoma. EXAM: NUCLEAR MEDICINE PET SKULL BASE TO THIGH TECHNIQUE: 11.8 mCi F-18 FDG was injected intravenously. Full-ring PET imaging was performed from the skull base to thigh after the radiotracer. CT data was obtained and used for attenuation correction and anatomic localization. Fasting blood glucose: 121 mg/dl COMPARISON:  Multiple exams, including PET-CT from 01/10/2018 FINDINGS: Mediastinal blood pool activity: SUV max 2.9 Background hepatic activity: SUV max 4.4 NECK: Small level II, level III, and level V lymph nodes are  present with only very low-grade activity. A right level V lymph node measuring 0.8 cm in short axis on image 39/4 has maximum SUV of 1.5 (Deauville 2). Incidental CT findings: Unremarkable CHEST: No significant abnormal hypermetabolic activity in this region. Incidental CT findings: Left Port-A-Cath tip: Cavoatrial junction. Atherosclerotic calcification of the coronary arteries and aortic arch. ABDOMEN/PELVIS: New focal hyperintensity in the sigmoid colon, maximum SUV 32.8, this was not appreciable on the prior  exam. New notable hypermetabolic activity in the prostate gland eccentric to the right, mostly in the peripheral zone, maximum SUV 12.2. There is new stranding around the prostate gland, seminal vesicles, and extending into the perirectal space. Incidental CT findings: No splenomegaly or adenopathy. Aortoiliac atherosclerotic vascular disease. SKELETON: No significant abnormal hypermetabolic activity in this region. Incidental CT findings: none IMPRESSION: 1. Small right cervical lymph nodes have only very low-grade activity, about half that of blood pool, and are small in size. Technically these are Deauville 2 as the lymph nodes have not completely resolved. 2. New focal hyperintensity in the sigmoid colon, maximum SUV 32.8 (Deauville 5). This was not present on 01/10/2018 and so presumably would represent physiologic activity, but the focal nature is unusual and a polyp is difficult to totally exclude. Correlate with colon screening history in determining whether further colon workup is warranted. 3. Prominent activity (Deauville 5) in the prostate gland, especially the peripheral zone, which is new and is associated with new periprostatic stranding. Prostatitis is favored given this constellation of findings. Electronically Signed: By: Van Clines M.D. On: 05/16/2018 15:06

## 2018-05-20 ENCOUNTER — Telehealth: Payer: Self-pay | Admitting: *Deleted

## 2018-05-20 LAB — URINE CULTURE: Culture: NO GROWTH

## 2018-05-20 NOTE — Telephone Encounter (Signed)
-----   Message from [name], MD sent at 05/20/2018 11:18 AM EDT ----- Can we let Mr. Mellinger that his urine culture was negative, but his UA did show some WBC's, so he should take the abx as prescribed, and we have placed referral for urology? Thanks.  Marina  ----- Message ----- From: Buel Ream, Lab [facility] [place] Sent: 05/19/2018  10:12 AM EDT To: [name], MD

## 2018-05-22 ENCOUNTER — Other Ambulatory Visit: Payer: Self-pay

## 2018-05-22 ENCOUNTER — Encounter: Payer: Self-pay | Admitting: Gastroenterology

## 2018-05-22 ENCOUNTER — Telehealth (INDEPENDENT_AMBULATORY_CARE_PROVIDER_SITE_OTHER): Payer: Medicare Other | Admitting: Gastroenterology

## 2018-05-22 VITALS — Ht 71.0 in | Wt 250.0 lb

## 2018-05-22 DIAGNOSIS — C8190 Hodgkin lymphoma, unspecified, unspecified site: Secondary | ICD-10-CM

## 2018-05-22 DIAGNOSIS — R948 Abnormal results of function studies of other organs and systems: Secondary | ICD-10-CM | POA: Diagnosis not present

## 2018-05-22 NOTE — Progress Notes (Signed)
Chief Complaint: abn PET  Referring Provider:  [name], MD      ASSESSMENT AND PLAN;   #1. Abn PET in 05/2018 with new focal FDG avidity (SUV 32.8) in the sigmoid colon. R/O polyp/carcinoma. Likely inflammatory.  #2. Hodgkin lymphoma s/p ABVD, still recovering from chemo, H/O SVT s/p ablation.  Plan: - Proceed with colonoscopy in early June with clenpiq at [facility]. (wants to wait until COVID 19 scare is over). Discussed risks & benefits. (Risks including rare perforation req laparotomy, bleeding after biopsies/polypectomy req blood transfusion, rare chance of missing neoplasms, risks of anesthesia/sedation). Benefits outweigh the risks.  All the questions were answered. Consent forms given for review.    HPI:    Bradley Novak is a 68 y.o. male  With abn PET showing increased activity in the sigmoid colon.  Hodgkin's lymphoma is in remission.  Advised colonoscopy.  He still recovering from side effects of chemotherapy.  He would like to wait for some more time.  No nausea, vomiting, heartburn, regurgitation, odynophagia or dysphagia.  No significant diarrhea or constipation.  There is no melena or hematochezia. No unintentional weight loss.  No abdominal pain.  1/5 Brothers had colonic polyps.  No family history of colon cancer.  Still recovering from chemo.  Last chemo was week of 04/01/2018.  Hemoglobin getting better.  Had abnormal LFTs but with negative PET scan for any liver lesions. Past Medical History:  Diagnosis Date   GERD (gastroesophageal reflux disease)    History of chemotherapy    7 months per patient   History of hiatal hernia    Hodgkin's lymphoma ([place]) 09/2017   Hyperlipidemia    Hypertension 2006   Obesity    Pneumonia 09/2017   SVT (supraventricular tachycardia) ([place])    Wears glasses     Past Surgical History:  Procedure Laterality Date   CARDIAC CATHETERIZATION  1980s   IR IMAGING GUIDED PORT INSERTION  11/20/2017   SVT ABLATION   01/29/2018   SVT ABLATION N/A 01/29/2018   Procedure: SVT ABLATION;  Surgeon: Constance Haw, MD;  Location: [place] [facility];  Service: Cardiovascular;  Laterality: N/A;    Family History  Problem Relation Age of Onset   Hypertension Father    Hypertension Brother    Hypertension Brother    Hypertension Brother    Hypertension Brother    Colon cancer Neg Hx    Esophageal cancer Neg Hx     Social History   Tobacco Use   Smoking status: Former Smoker    Packs/day: 2.00    Years: 5.00    Pack years: 10.00    Types: Cigarettes    Last attempt to quit: 1974    Years since quitting: 46.3   Smokeless tobacco: Never Used  Substance Use Topics   Alcohol use: No   Drug use: Never    Current Outpatient Medications  Medication Sig Dispense Refill   aspirin EC 81 MG tablet Take 1 tablet (81 mg total) by mouth daily. 90 tablet 3   atorvastatin (LIPITOR) 20 MG tablet TAKE 1 TABLET BY MOUTH DAILY (Patient taking differently: Take 20 mg by mouth daily. ) 90 tablet 2   Cholecalciferol (VITAMIN D PO) Take 1 capsule by mouth at bedtime.     ciprofloxacin (CIPRO) 500 MG tablet Take 1 tablet (500 mg total) by mouth 2 (two) times daily for 14 days. 28 tablet 0   diltiazem (CARDIZEM CD) 120 MG 24 hr capsule Take 1  capsule (120 mg total) by mouth daily. 90 capsule 1   DULoxetine (CYMBALTA) 30 MG capsule Take 1 capsule (30 mg total) by mouth daily for 7 days, THEN 2 capsules (60 mg total) daily for 30 days. (Patient taking differently: THEN 2 capsules (60 mg total) daily for 30 days.) 67 capsule 5   esomeprazole (NEXIUM) 20 MG capsule Take 20 mg by mouth 2 (two) times daily.     metoprolol succinate (TOPROL XL) 100 MG 24 hr tablet Take 1 tablet (100 mg total) by mouth 2 (two) times daily. Take with or immediately following a meal. 180 tablet 3   potassium chloride SA (K-DUR) 20 MEQ tablet Take 1 tablet (20 mEq total) by mouth 2 (two) times daily. 180 tablet 0   vitamin  B-12 (CYANOCOBALAMIN) 100 MCG tablet Take 100 mcg by mouth daily.     HYDROcodone-acetaminophen (NORCO) 7.5-325 MG tablet Take 1 tablet by mouth every 6 (six) hours as needed for moderate pain. (Patient not taking: Reported on 05/22/2018) 12 tablet 0   LORazepam (ATIVAN) 0.5 MG tablet Take 1 tablet (0.5 mg total) by mouth every 6 (six) hours as needed (Nausea or vomiting). (Patient not taking: Reported on 05/22/2018) 30 tablet 0   nitroGLYCERIN (NITROSTAT) 0.3 MG SL tablet Place 1 tablet (0.3 mg total) under the tongue every 5 (five) minutes as needed for chest pain. (Patient not taking: Reported on 05/22/2018) 30 tablet 0   No current facility-administered medications for this visit.     No Known Allergies  Review of Systems:  Constitutional: Denies fever, chills, diaphoresis, appetite change and fatigue.  HEENT: Denies photophobia, eye pain, redness, hearing loss, ear pain, congestion, sore throat, rhinorrhea, sneezing, mouth sores, neck pain, neck stiffness and tinnitus.   Respiratory: Denies SOB, DOE, cough, chest tightness,  and wheezing.   Cardiovascular: Denies chest pain, palpitations and leg swelling.  Genitourinary: Denies dysuria, urgency, frequency, hematuria, flank pain and difficulty urinating.  Musculoskeletal: Denies myalgias, back pain, joint swelling, arthralgias and gait problem.  Skin: No rash.  Neurological: Denies dizziness, seizures, syncope, weakness, light-headedness, numbness and headaches.  Hematological: Denies adenopathy. Easy bruising, personal or family bleeding history  Psychiatric/Behavioral: No anxiety or depression     Physical Exam:    Ht 5\' 11"  (1.803 m)    Wt 250 lb (113.4 kg)    BMI 34.87 kg/m  Filed Weights   05/22/18 0922  Weight: 250 lb (113.4 kg)   Not examined since it was a tele-visit  Data Reviewed: I have personally reviewed following labs and imaging studies  CBC: CBC Latest Ref Rng & Units 05/19/2018 04/14/2018 03/31/2018  WBC 4.0 - 10.5  K/uL 9.7 2.6(L) 2.1(L)  Hemoglobin 13.0 - 17.0 g/dL 11.3(L) 9.3(L) 9.7(L)  Hematocrit 39.0 - 52.0 % 36.4(L) 30.0(L) 30.5(L)  Platelets 150 - 400 K/uL 243 300 231    CMP: CMP Latest Ref Rng & Units 05/19/2018 04/14/2018 03/31/2018  Glucose 70 - 99 mg/dL 116(H) 140(H) 133(H)  BUN 8 - 23 mg/dL 12 12 13   Creatinine 0.61 - 1.24 mg/dL 0.87 0.83 0.85  Sodium 135 - 145 mmol/L 137 132(L) 131(L)  Potassium 3.5 - 5.1 mmol/L 3.8 4.3 3.9  Chloride 98 - 111 mmol/L 101 95(L) 95(L)  CO2 22 - 32 mmol/L 27 26 25   Calcium 8.9 - 10.3 mg/dL 9.7 9.0 9.0  Total Protein 6.5 - 8.1 g/dL 7.1 6.3(L) 6.4(L)  Total Bilirubin 0.3 - 1.2 mg/dL 0.4 0.7 0.7  Alkaline Phos 38 - 126 U/L 145(H)  92 93  AST 15 - 41 U/L 43(H) 17 17  ALT 0 - 44 U/L 71(H) 27 26    GFR: Estimated Creatinine Clearance: 106.9 mL/min (by C-G formula based on SCr of 0.87 mg/dL). Liver Function Tests: Recent Labs  Lab 05/19/18 0950  AST 43*  ALT 71*  ALKPHOS 145*  BILITOT 0.4  PROT 7.1  ALBUMIN 3.6    Recent Results (from the past 240 hour(s))  Culture, Urine     Status: None   Collection Time: 05/19/18 10:36 AM  Result Value Ref Range Status   Specimen Description   Final    URINE, CLEAN CATCH Performed at [facility] at [facility], [address] [place], [place] [address]    Special Requests   Final    URINE, CLEAN CATCH Performed at [facility] at [facility], [address], [place], [place] [address]    Culture   Final    NO GROWTH Performed at [facility], [place] [address] [place], [place] [address]    Report Status 05/20/2018 FINAL  Final      Radiology Studies: Nm Pet Image Restag (ps) Skull Base To Thigh  Addendum Date: 05/19/2018   ADDENDUM REPORT: 05/19/2018 10:26 ADDENDUM: The original report was by Dr. Van Clines. The following addendum is by Dr. Van Clines: These results were discussed by telephone on 05/19/2018 at 10:23 am to Dr. [name]. We discussed the small remaining right cervical lymph nodes, which are currently well below blood pool in activity, and are currently within normal size limits. This may well reflect complete response to therapy; a Deauville 2 level of nodal activity is often seen in normal patients. Electronically Signed   By: Van Clines M.D.   On: 05/19/2018 10:26   Result Date: 05/19/2018 CLINICAL DATA:  Subsequent treatment strategy for Hodgkin lymphoma. EXAM: NUCLEAR MEDICINE PET SKULL BASE TO THIGH TECHNIQUE: 11.8 mCi F-18 FDG was injected intravenously. Full-ring PET imaging was performed from the skull base to thigh after the radiotracer. CT data was obtained and used for attenuation correction and anatomic localization. Fasting blood glucose: 121 mg/dl COMPARISON:  Multiple exams, including PET-CT from 01/10/2018 FINDINGS: Mediastinal blood pool activity: SUV max 2.9 Background hepatic activity: SUV max 4.4 NECK: Small level II, level III, and level V lymph nodes are present with only very low-grade activity. A right level V lymph node measuring 0.8 cm in short axis on image 39/4 has maximum SUV of 1.5 (Deauville 2). Incidental CT findings: Unremarkable CHEST: No significant abnormal hypermetabolic activity in this region. Incidental CT findings: Left Port-A-Cath tip: Cavoatrial junction. Atherosclerotic calcification of the coronary arteries and aortic arch. ABDOMEN/PELVIS: New focal hyperintensity in the sigmoid colon, maximum SUV 32.8, this was not appreciable on the prior exam. New notable hypermetabolic activity in the prostate gland eccentric to the right, mostly in the peripheral zone, maximum SUV 12.2. There is new stranding around the prostate gland, seminal vesicles, and extending into the perirectal space. Incidental CT findings: No splenomegaly or adenopathy. Aortoiliac atherosclerotic vascular disease. SKELETON: No significant abnormal hypermetabolic activity in this region. Incidental CT  findings: none IMPRESSION: 1. Small right cervical lymph nodes have only very low-grade activity, about half that of blood pool, and are small in size. Technically these are Deauville 2 as the lymph nodes have not completely resolved. 2. New focal hyperintensity in the sigmoid colon, maximum SUV 32.8 (Deauville 5). This was not  present on 01/10/2018 and so presumably would represent physiologic activity, but the focal nature is unusual and a polyp is difficult to totally exclude. Correlate with colon screening history in determining whether further colon workup is warranted. 3. Prominent activity (Deauville 5) in the prostate gland, especially the peripheral zone, which is new and is associated with new periprostatic stranding. Prostatitis is favored given this constellation of findings. Electronically Signed: By: Van Clines M.D. On: 05/16/2018 15:06   This service was provided via telemedicine.  The patient was located at home.  The provider was located in office.  The patient did consent to this telephone visit and is aware of possible charges through their insurance for this visit.  The patient was referred by Dr. Maylon Peppers.    Time spent on call and coordination of care, review PET: 30 min    Carmell Austria, MD 05/22/2018, 10:59 AM  Cc: [name], MD

## 2018-05-22 NOTE — Patient Instructions (Signed)
To help prevent the possible spread of infection to our patients, communities, and staff; we will be implementing the following measures:  As of now we are not allowing any visitors/family members to accompany you to any upcoming appointments with [facility]. If you have any concerns about this please contact our office to discuss prior to the appointment.   We can't schedule your colonoscopy due to Covid-19 restrictions we can't schedule your colonoscopy. Please call us towards the end of the month to see about schedule for June.

## 2018-05-30 ENCOUNTER — Other Ambulatory Visit: Payer: Self-pay | Admitting: Student

## 2018-05-30 ENCOUNTER — Telehealth: Payer: Self-pay | Admitting: *Deleted

## 2018-05-30 NOTE — Telephone Encounter (Signed)

## 2018-06-02 ENCOUNTER — Ambulatory Visit (HOSPITAL_COMMUNITY)
Admission: RE | Admit: 2018-06-02 | Discharge: 2018-06-02 | Disposition: A | Discharge status: Home / Self Care | Payer: Medicare Other | Source: Ambulatory Visit | Attending: Hematology | Admitting: Hematology

## 2018-06-02 ENCOUNTER — Encounter (HOSPITAL_COMMUNITY): Payer: Self-pay

## 2018-06-02 ENCOUNTER — Other Ambulatory Visit: Payer: Self-pay

## 2018-06-02 DIAGNOSIS — I10 Essential (primary) hypertension: Secondary | ICD-10-CM | POA: Insufficient documentation

## 2018-06-02 DIAGNOSIS — Z08 Encounter for follow-up examination after completed treatment for malignant neoplasm: Secondary | ICD-10-CM | POA: Diagnosis not present

## 2018-06-02 DIAGNOSIS — Z452 Encounter for adjustment and management of vascular access device: Secondary | ICD-10-CM | POA: Diagnosis not present

## 2018-06-02 DIAGNOSIS — Z7982 Long term (current) use of aspirin: Secondary | ICD-10-CM | POA: Diagnosis not present

## 2018-06-02 DIAGNOSIS — C8191 Hodgkin lymphoma, unspecified, lymph nodes of head, face, and neck: Secondary | ICD-10-CM | POA: Diagnosis not present

## 2018-06-02 DIAGNOSIS — E785 Hyperlipidemia, unspecified: Secondary | ICD-10-CM | POA: Diagnosis not present

## 2018-06-02 DIAGNOSIS — C8199 Hodgkin lymphoma, unspecified, extranodal and solid organ sites: Secondary | ICD-10-CM | POA: Diagnosis not present

## 2018-06-02 DIAGNOSIS — E669 Obesity, unspecified: Secondary | ICD-10-CM | POA: Diagnosis not present

## 2018-06-02 DIAGNOSIS — C8190 Hodgkin lymphoma, unspecified, unspecified site: Secondary | ICD-10-CM | POA: Diagnosis not present

## 2018-06-02 DIAGNOSIS — K219 Gastro-esophageal reflux disease without esophagitis: Secondary | ICD-10-CM | POA: Diagnosis not present

## 2018-06-02 DIAGNOSIS — Z79899 Other long term (current) drug therapy: Secondary | ICD-10-CM | POA: Diagnosis not present

## 2018-06-02 DIAGNOSIS — Z9221 Personal history of antineoplastic chemotherapy: Secondary | ICD-10-CM | POA: Insufficient documentation

## 2018-06-02 HISTORY — PX: IR REMOVAL TUN ACCESS W/ PORT W/O FL MOD SED: IMG2290

## 2018-06-02 LAB — PROTIME-INR
INR: 1.1 (ref 0.8–1.2)
Prothrombin Time: 13.7 seconds (ref 11.4–15.2)

## 2018-06-02 LAB — CBC
HCT: 37.3 % — ABNORMAL LOW (ref 39.0–52.0)
Hemoglobin: 11.4 g/dL — ABNORMAL LOW (ref 13.0–17.0)
MCH: 27 pg (ref 26.0–34.0)
MCHC: 30.6 g/dL (ref 30.0–36.0)
MCV: 88.4 fL (ref 80.0–100.0)
Platelets: 245 10*3/uL (ref 150–400)
RBC: 4.22 MIL/uL (ref 4.22–5.81)
RDW: 15.8 % — ABNORMAL HIGH (ref 11.5–15.5)
WBC: 6.3 10*3/uL (ref 4.0–10.5)
nRBC: 0 % (ref 0.0–0.2)

## 2018-06-02 LAB — APTT: aPTT: 33 seconds (ref 24–36)

## 2018-06-02 MED ORDER — FENTANYL CITRATE (PF) 100 MCG/2ML IJ SOLN
INTRAMUSCULAR | Status: AC
  Filled 2018-06-02: qty 2

## 2018-06-02 MED ORDER — CEFAZOLIN SODIUM-DEXTROSE 2-4 GM/100ML-% IV SOLN
2.0000 g | INTRAVENOUS | Status: AC
  Administered 2018-06-02: 2 g via INTRAVENOUS

## 2018-06-02 MED ORDER — FENTANYL CITRATE (PF) 100 MCG/2ML IJ SOLN
INTRAMUSCULAR | Status: AC | PRN
  Administered 2018-06-02 (×2): 50 ug via INTRAVENOUS

## 2018-06-02 MED ORDER — LIDOCAINE-EPINEPHRINE (PF) 1 %-1:200000 IJ SOLN
INTRAMUSCULAR | Status: AC | PRN
  Administered 2018-06-02: 10 mL

## 2018-06-02 MED ORDER — MIDAZOLAM HCL 2 MG/2ML IJ SOLN
INTRAMUSCULAR | Status: AC | PRN
  Administered 2018-06-02 (×2): 1 mg via INTRAVENOUS

## 2018-06-02 MED ORDER — SODIUM CHLORIDE 0.9 % IV SOLN
INTRAVENOUS | Status: DC
  Administered 2018-06-02: 13:00:00 via INTRAVENOUS

## 2018-06-02 MED ORDER — LIDOCAINE-EPINEPHRINE 1 %-1:100000 IJ SOLN
INTRAMUSCULAR | Status: AC
  Filled 2018-06-02: qty 1

## 2018-06-02 MED ORDER — MIDAZOLAM HCL 2 MG/2ML IJ SOLN
INTRAMUSCULAR | Status: AC
  Filled 2018-06-02: qty 4

## 2018-06-02 MED ORDER — CEFAZOLIN SODIUM-DEXTROSE 2-4 GM/100ML-% IV SOLN
INTRAVENOUS | Status: AC
  Administered 2018-06-02: 2 g via INTRAVENOUS
  Filled 2018-06-02: qty 100

## 2018-06-02 NOTE — Discharge Instructions (Addendum)
Moderate Conscious Sedation, Adult, Care After These instructions provide you with information about caring for yourself after your procedure. Your health care provider may also give you more specific instructions. Your treatment has been planned according to current medical practices, but problems sometimes occur. Call your health care provider if you have any problems or questions after your procedure. What can I expect after the procedure? After your procedure, it is common:  To feel sleepy for several hours.  To feel clumsy and have poor balance for several hours.  To have poor judgment for several hours.  To vomit if you eat too soon. Follow these instructions at home: For at least 24 hours after the procedure:   Do not: ? Participate in activities where you could fall or become injured. ? Drive. ? Use heavy machinery. ? Drink alcohol. ? Take sleeping pills or medicines that cause drowsiness. ? Make important decisions or sign legal documents. ? Take care of children on your own.  Rest. Eating and drinking  Follow the diet recommended by your health care provider.  If you vomit: ? Drink water, juice, or soup when you can drink without vomiting. ? Make sure you have little or no nausea before eating solid foods. General instructions  Have a responsible adult stay with you until you are awake and alert.  Take over-the-counter and prescription medicines only as told by your health care provider.  If you smoke, do not smoke without supervision.  Keep all follow-up visits as told by your health care provider. This is important. Contact a health care provider if:  You keep feeling nauseous or you keep vomiting.  You feel light-headed.  You develop a rash.  You have a fever. Get help right away if:  You have trouble breathing. This information is not intended to replace advice given to you by your health care provider. Make sure you discuss any questions you have  with your health care provider. Document Released: 10/22/2012 Document Revised: 06/06/2015 Document Reviewed: 04/23/2015 Elsevier Interactive Patient Education  2019 [place] Removal, Care After This sheet gives you information about how to care for yourself after your procedure. Your health care provider may also give you more specific instructions. If you have problems or questions, contact your health care provider. What can I expect after the procedure? After the procedure, it is common to have:  Soreness or pain near your incision.  Some swelling or bruising near your incision. Follow these instructions at home: Medicines  Take over-the-counter and prescription medicines only as told by your health care provider.  If you were prescribed an antibiotic medicine, take it as told by your health care provider. Do not stop taking the antibiotic even if you start to feel better. Bathing  Do not take baths, swim, or use a hot tub until your health care provider approves. Ask your health care provider if you can take showers. You may only be allowed to take sponge baths.  You may shower tomorrow. Incision care   Follow instructions from your health care provider about how to take care of your incision. Make sure you: ? Wash your hands with soap and water before you change your bandage (dressing). If soap and water are not available, use hand sanitizer. ? Change your dressing as told by your health care provider. ? Keep your dressing dry.  You may remove your dressing tomorrow. ? Leave stitches (sutures), skin glue, or adhesive strips in place. These skin closures  may need to stay in place for 2 weeks or longer. If adhesive strip edges start to loosen and curl up, you may trim the loose edges. Do not remove adhesive strips completely unless your health care provider tells you to do that.  Check your incision area every day for signs of infection. Check for: ? More  redness, swelling, or pain. ? More fluid or blood. ? Warmth. ? Pus or a bad smell. Driving   Do not drive for 24 hours if you were given a medicine to help you relax (sedative) during your procedure.  If you did not receive a sedative, ask your health care provider when it is safe to drive. Activity  Return to your normal activities as told by your health care provider. Ask your health care provider what activities are safe for you.  Do not lift anything that is heavier than 10 lb (4.5 kg), or the limit that you are told, until your health care provider says that it is safe.  Do not do activities that involve lifting your arms over your head. General instructions  Do not use any products that contain nicotine or tobacco, such as cigarettes and e-cigarettes. These can delay healing. If you need help quitting, ask your health care provider.  Keep all follow-up visits as told by your health care provider. This is important. Contact a health care provider if:  You have more redness, swelling, or pain around your incision.  You have more fluid or blood coming from your incision.  Your incision feels warm to the touch.  You have pus or a bad smell coming from your incision.  You have pain that is not relieved by your pain medicine. Get help right away if you have:  A fever or chills.  Chest pain.  Difficulty breathing. Summary  After the procedure, it is common to have pain, soreness, swelling, or bruising near your incision.  If you were prescribed an antibiotic medicine, take it as told by your health care provider. Do not stop taking the antibiotic even if you start to feel better.  Do not drive for 24 hours if you were given a sedative during your procedure.  Return to your normal activities as told by your health care provider. Ask your health care provider what activities are safe for you. This information is not intended to replace advice given to you by your health  care provider. Make sure you discuss any questions you have with your health care provider. Document Released: 12/13/2014 Document Revised: 02/14/2017 Document Reviewed: 02/14/2017 Elsevier Interactive Patient Education  2019 [employer].

## 2018-06-02 NOTE — Procedures (Signed)
  Procedure: Removal L IJ port catheter   EBL:   minimal Complications:  none immediate  See full dictation in [employer].  Dillard Cannon MD Main # [phone] Pager  [phone]

## 2018-06-02 NOTE — H&P (Signed)
Referring Physician(s): Zhao,Yan  Supervising Physician: Arne Cleveland  Patient Status:  [facility]  Chief Complaint: "I'm getting my port out"   Subjective: Patient familiar to IR service from right cervical lymph node biopsy on 10/25/2017 and Port-A-Cath placement on 11/20/2017.  He has a history of Hodgkin's lymphoma and is status post treatment.  He is currently in remission.  He presents again today for Port-A-Cath removal.  Denies fever, headache, chest pain, dyspnea, cough, abdominal/back pain, nausea, vomiting or bleeding.  Past Medical History:  Diagnosis Date  . GERD (gastroesophageal reflux disease)   . History of chemotherapy    7 months per patient  . History of hiatal hernia   . Hodgkin's lymphoma ([place]) 09/2017  . Hyperlipidemia   . Hypertension 2006  . Obesity   . Pneumonia 09/2017  . SVT (supraventricular tachycardia) ([place])   . Wears glasses    Past Surgical History:  Procedure Laterality Date  . CARDIAC CATHETERIZATION  1980s  . IR IMAGING GUIDED PORT INSERTION  11/20/2017  . SVT ABLATION  01/29/2018  . SVT ABLATION N/A 01/29/2018   Procedure: SVT ABLATION;  Surgeon: Constance Haw, MD;  Location: [place] [facility];  Service: Cardiovascular;  Laterality: N/A;       Allergies: Patient has no known allergies.  Medications: Prior to Admission medications   Medication Sig Start Date End Date Taking? Authorizing Provider  aspirin EC 81 MG tablet Take 1 tablet (81 mg total) by mouth daily. 08/30/17   Tysinger, Camelia Eng, PA-C  atorvastatin (LIPITOR) 20 MG tablet TAKE 1 TABLET BY MOUTH DAILY Patient taking differently: Take 20 mg by mouth daily.  09/02/17   Tysinger, Camelia Eng, PA-C  Cholecalciferol (VITAMIN D PO) Take 1 capsule by mouth at bedtime.    [provider]  ciprofloxacin (CIPRO) 500 MG tablet Take 1 tablet (500 mg total) by mouth 2 (two) times daily for 14 days. 05/19/18 06/02/18  [name], MD  diltiazem (CARDIZEM CD) 120 MG 24 hr  capsule Take 1 capsule (120 mg total) by mouth daily. 03/10/18 06/08/18  Camnitz, Ocie Doyne, MD  DULoxetine (CYMBALTA) 30 MG capsule Take 1 capsule (30 mg total) by mouth daily for 7 days, THEN 2 capsules (60 mg total) daily for 30 days. Patient taking differently: THEN 2 capsules (60 mg total) daily for 30 days. 02/28/18 05/22/18  [name], MD  esomeprazole (NEXIUM) 20 MG capsule Take 20 mg by mouth 2 (two) times daily.    [provider]  HYDROcodone-acetaminophen (NORCO) 7.5-325 MG tablet Take 1 tablet by mouth every 6 (six) hours as needed for moderate pain. Patient not taking: Reported on 05/22/2018 02/28/18   Tysinger, Camelia Eng, PA-C  LORazepam (ATIVAN) 0.5 MG tablet Take 1 tablet (0.5 mg total) by mouth every 6 (six) hours as needed (Nausea or vomiting). Patient not taking: Reported on 05/22/2018 03/28/18   [name], MD  metoprolol succinate (TOPROL XL) 100 MG 24 hr tablet Take 1 tablet (100 mg total) by mouth 2 (two) times daily. Take with or immediately following a meal. 01/31/18 01/31/19  Baldwin Jamaica, PA-C  nitroGLYCERIN (NITROSTAT) 0.3 MG SL tablet Place 1 tablet (0.3 mg total) under the tongue every 5 (five) minutes as needed for chest pain. Patient not taking: Reported on 05/22/2018 02/14/18 02/14/19  Tysinger, Camelia Eng, PA-C  potassium chloride SA (K-DUR) 20 MEQ tablet Take 1 tablet (20 mEq total) by mouth 2 (two) times daily. 05/14/18 08/12/18  [name], MD  vitamin B-12 (CYANOCOBALAMIN) 100 MCG tablet Take 100 mcg by mouth daily.    [provider]     Vital Signs: Blood pressure 164/100, heart rate 61, temp 98.1, respirations 18, O2 sats 99% room air   Physical Exam awake, alert.  Chest clear to auscultation bilaterally.  Clean, intact left chest wall Port-A-Cath.  Heart with regular rate and rhythm.  Abdomen soft, positive bowel sounds, nontender.  Extremities with full range of motion.  Imaging: No results found.  Labs:  CBC: Recent Labs    03/24/18 1235  03/31/18 1040 04/14/18 0922 05/19/18 0950  WBC 3.0* 2.1* 2.6* 9.7  HGB 8.7* 9.7* 9.3* 11.3*  HCT 26.9* 30.5* 30.0* 36.4*  PLT 167 231 300 243    COAGS: Recent Labs    10/25/17 1226 11/20/17 1334  INR 1.10 1.08    BMP: Recent Labs    03/24/18 1235 03/31/18 1040 04/14/18 0922 05/19/18 0950  NA 134* 131* 132* 137  K 3.6 3.9 4.3 3.8  CL 99 95* 95* 101  CO2 28 25 26 27   GLUCOSE 160* 133* 140* 116*  BUN 23 13 12 12   CALCIUM 8.1* 9.0 9.0 9.7  CREATININE 0.68 0.85 0.83 0.87  GFRNONAA >60 >60 >60 >60  GFRAA >60 >60 >60 >60    LIVER FUNCTION TESTS: Recent Labs    03/24/18 1235 03/31/18 1040 04/14/18 0922 05/19/18 0950  BILITOT 0.5 0.7 0.7 0.4  AST 21 17 17  43*  ALT 48* 26 27 71*  ALKPHOS 54 93 92 145*  PROT 5.2* 6.4* 6.3* 7.1  ALBUMIN 3.2* 3.8 3.6 3.6    Assessment and Plan: Patient with history of Hodgkin's lymphoma 2019, status post treatment and currently in remission; presents today for Port-A-Cath removal.  Details/risks of procedure, including but not limited to, internal bleeding, infection, injury to adjacent structures discussed with patient with his understanding and consent.   LABS PENDING  Electronically Signed: D. Rowe Robert, PA-C 06/02/2018, 12:41 PM   I spent a total of 20 minutes at the the patient's bedside AND on the patient's hospital floor or unit, greater than 50% of which was counseling/coordinating care for Port-A-Cath removal

## 2018-06-05 ENCOUNTER — Telehealth (INDEPENDENT_AMBULATORY_CARE_PROVIDER_SITE_OTHER): Payer: Medicare Other | Admitting: Cardiology

## 2018-06-05 ENCOUNTER — Other Ambulatory Visit: Payer: Self-pay

## 2018-06-05 DIAGNOSIS — I471 Supraventricular tachycardia: Secondary | ICD-10-CM

## 2018-06-05 NOTE — Progress Notes (Signed)
Electrophysiology TeleHealth Note   Due to national recommendations of social distancing due to COVID 19, an audio/video telehealth visit is felt to be most appropriate for this patient at this time.  See Epic message for the patient's consent to telehealth for [facility]   Date:  06/05/2018   ID:  Bradley Novak, DOB 1950/05/29, MRN [id number]  Location: patient's home  Provider location: [address] [place]  Evaluation Performed: Follow-up visit  PCP:  Bradley Hurl, PA-C  Cardiologist:  Bradley Moores, MD  Electrophysiologist:  Dr Bradley Novak  Chief Complaint:  SVT  History of Present Illness:    Bradley Novak is a 68 y.o. male who presents via audio/video conferencing for a telehealth visit today.  Since last being seen in our clinic, the patient reports doing very well.  Today, he denies symptoms of palpitations, chest pain, shortness of breath,  lower extremity edema, dizziness, presyncope, or syncope.  The patient is otherwise without complaint today.  The patient denies symptoms of fevers, chills, cough, or new SOB worrisome for COVID 19.  He has a history significant for Hodgkin's lymphoma, hypertension, and ORT.  He underwent catheter ablation 02/08/2018.  Unfortunately he came back the next day with recurrent SVT.  He had an accessory pathway at CS 5 6.    Today, denies symptoms of palpitations, chest pain, shortness of breath, orthopnea, PND, lower extremity edema, claudication, dizziness, presyncope, syncope, bleeding, or neurologic sequela. The patient is tolerating medications without difficulties.  He has fortunately finished his chemotherapy.  He finished this approximately 6 weeks ago.  Since that time, he has been getting stronger.  He is able to do most of his daily activities.  He has not noted any further episodes of tachycardia.  Past Medical History:  Diagnosis Date  . GERD (gastroesophageal reflux disease)   . History of  chemotherapy    7 months per patient  . History of hiatal hernia   . Hodgkin's lymphoma ([place]) 09/2017  . Hyperlipidemia   . Hypertension 2006  . Obesity   . Pneumonia 09/2017  . SVT (supraventricular tachycardia) ([place])   . Wears glasses     Past Surgical History:  Procedure Laterality Date  . CARDIAC CATHETERIZATION  1980s  . IR IMAGING GUIDED PORT INSERTION  11/20/2017  . IR REMOVAL TUN ACCESS W/ PORT W/O FL MOD SED  06/02/2018  . SVT ABLATION  01/29/2018  . SVT ABLATION N/A 01/29/2018   Procedure: SVT ABLATION;  Surgeon: Bradley Haw, MD;  Location: [facility];  Service: Cardiovascular;  Laterality: N/A;    Current Outpatient Medications  Medication Sig Dispense Refill  . aspirin EC 81 MG tablet Take 1 tablet (81 mg total) by mouth daily. 90 tablet 3  . atorvastatin (LIPITOR) 20 MG tablet TAKE 1 TABLET BY MOUTH DAILY (Patient taking differently: Take 20 mg by mouth daily. ) 90 tablet 2  . Cholecalciferol (VITAMIN D PO) Take 1 capsule by mouth at bedtime.    Marland Kitchen diltiazem (CARDIZEM CD) 120 MG 24 hr capsule Take 1 capsule (120 mg total) by mouth daily. 90 capsule 1  . esomeprazole (NEXIUM) 20 MG capsule Take 20 mg by mouth 2 (two) times daily.    Marland Kitchen HYDROcodone-acetaminophen (NORCO) 7.5-325 MG tablet Take 1 tablet by mouth every 6 (six) hours as needed for moderate pain. 12 tablet 0  . LORazepam (ATIVAN) 0.5 MG tablet Take 1 tablet (0.5 mg total) by mouth every 6 (  six) hours as needed (Nausea or vomiting). 30 tablet 0  . metoprolol succinate (TOPROL XL) 100 MG 24 hr tablet Take 1 tablet (100 mg total) by mouth 2 (two) times daily. Take with or immediately following a meal. 180 tablet 3  . nitroGLYCERIN (NITROSTAT) 0.3 MG SL tablet Place 1 tablet (0.3 mg total) under the tongue every 5 (five) minutes as needed for chest pain. 30 tablet 0  . potassium chloride SA (K-DUR) 20 MEQ tablet Take 1 tablet (20 mEq total) by mouth 2 (two) times daily. 180 tablet 0  . vitamin B-12  (CYANOCOBALAMIN) 100 MCG tablet Take 100 mcg by mouth daily.    . DULoxetine (CYMBALTA) 30 MG capsule Take 1 capsule (30 mg total) by mouth daily for 7 days, THEN 2 capsules (60 mg total) daily for 30 days. (Patient taking differently: THEN 2 capsules (60 mg total) daily for 30 days.) 67 capsule 5   No current facility-administered medications for this visit.     Allergies:   Patient has no known allergies.   Social History:  The patient  reports that he quit smoking about 46 years ago. His smoking use included cigarettes. He has a 10.00 pack-year smoking history. He has never used smokeless tobacco. He reports that he does not drink alcohol or use drugs.   Family History:  The patient's  family history includes Hypertension in his brother, brother, brother, brother, and father.   ROS:  Please see the history of present illness.   All other systems are personally reviewed and negative.    Exam:    Vital Signs:  BP 135/84   Pulse (!) 58   SpO2 98%   Well appearing, alert and conversant, regular work of breathing,  good skin color Eyes- anicteric, neuro- grossly intact, skin- no apparent rash or lesions or cyanosis, mouth- oral mucosa is pink   Labs/Other Tests and Data Reviewed:    Recent Labs: 01/21/2018: TSH 1.149 04/14/2018: Magnesium 1.7 05/19/2018: ALT 71; BUN 12; Creatinine 0.87; Potassium 3.8; Sodium 137 06/02/2018: Hemoglobin 11.4; Platelets 245   Wt Readings from Last 3 Encounters:  05/22/18 250 lb (113.4 kg)  05/19/18 248 lb (112.5 kg)  04/14/18 237 lb (107.5 kg)     Other studies personally reviewed: Additional studies/ records that were reviewed today include: ECG 03/10/2018 personally reviewed Review of the above records today demonstrates: Sinus rhythm, incomplete right bundle branch block    ASSESSMENT & PLAN:    1.  ORT: Status post ablation 01/29/2018.  Pathway was at CS 5 6.  He is currently on both diltiazem and metoprolol.  He has had no further issues with  SVT.  Unfortunately he is having some erectile dysfunction.  It is unclear to me whether this is coming from his medications or his recent chemotherapy.  I Bradley Novak see him back in 3 months for further discussions of this.   COVID 19 screen The patient denies symptoms of COVID 19 at this time.  The importance of social distancing was discussed today.  Follow-up: 3 months  Current medicines are reviewed at length with the patient today.   The patient does not have concerns regarding his medicines.  The following changes were made today:  none  Labs/ tests ordered today include:  No orders of the defined types were placed in this encounter.    Patient Risk:  after full review of this patients clinical status, I feel that they are at moderate risk at this time.  Today, I  have spent 11 minutes with the patient with telehealth technology discussing SVT, Hodgkin's lymphoma.    Signed, Tore Carreker Meredith Leeds, MD  06/05/2018 10:49 AM     [facility] [address] [place] [address] [phone] (office) [phone] (fax)

## 2018-07-01 DIAGNOSIS — M170 Bilateral primary osteoarthritis of knee: Secondary | ICD-10-CM | POA: Diagnosis not present

## 2018-07-21 NOTE — Progress Notes (Signed)
Bradley Novak OFFICE PROGRESS NOTE  Patient Care Team: Tysinger, Camelia Eng, PA-C as PCP - General (Family Medicine) Nahser, Wonda Cheng, MD as PCP - Cardiology (Cardiology) Constance Haw, MD as PCP - Electrophysiology (Cardiology)  HEME/ONC OVERVIEW: 1. Stage III classic Hodgkin lymphoma, subtype NOS due to limited specimen; currently NED  -Bx-proven R cervical LN involvement in 09/2017; PET in 11/2017 showed cervical, mediastinal and upper abdominal LN involement; treatment delayed due to pt traveling on a cruise after diagnosis -11/2017 - 01/2018: ABVD x 2 cycles  12/2017: PET after 2 cycles of ABVD showed interval CR  -Chemotherapy delayed due to SVT requiring ablation in 01/2018 -01/2018 - 03/2018: AVD x two and a half cycles; patient declined further treatment due to poor tolerance  -05/2018: end-of treatment PET showed CR (0.8cm R cervical LN, Deauville 2; no marrow lesion)  2. Port placement in 11/2017   CHEMOTHERAPY REGIMEN:  11/26/2017 - 01/07/2018: ABVD x 2 cycles, CR  02/04/2018 - 04/01/2018: AVD x two and a half cycles; patient declined further treatment due to poor tolerance   ASSESSMENT & PLAN:   Stage III classic Hodgkin lymphoma, favorable risk  -Clinically, patient is doing well without any constitutional symptoms -Based on CR, follow-up will be as below:  Labs and exam q3-56months x 1-2 years, then q6-81months until Year 3, and then annually  CT neck, chest, and AP at 6, 12 and 24 months following completion of therapy   Annual influenza vaccine  -I have ordered CT neck, chest and AP for mid-09/2018  Abnormal sigmoid colon uptake -Patient was evaluated by Dr. Lyndel Safe of GI via telemedicine in 05/2018 but no procedure has been scheduled yet due to [place] -He denies any abdominal pain, nausea, vomiting, diarrhea, hematochezia or melena -I encouraged the patient to reach out to Dr. Steve Rattler office to follow up on the timing of colonoscopy   Abnormal  prostate uptake -Incidentally noted on post-PET, treated with abx for possible prostatitis; PET prior to starting treatment did not suggest any prostate abnormality, making malignancy less likely  -Urology referral placed in 05/2018 but no appt scheduled yet -We will reach out to the urology office and see when his appt can be scheduled    History of hypokalemia -Likely due to electrolyte wasting from previous chemotherapy -Currently on KCl 62mEq BID -K 3.8 today -I have reduced his KCl to 38mEq daily and encouraged to follow up with his PCP for further management   Orders Placed This Encounter  Procedures  . CT SOFT TISSUE NECK W CONTRAST    Standing Status:   Future    Standing Expiration Date:   07/22/2019    Order Specific Question:   If indicated for the ordered procedure, I authorize the administration of contrast media per Radiology protocol    Answer:   Yes    Order Specific Question:   Preferred imaging location?    Answer:   [facility] Specific Question:   Radiology Contrast Protocol - do NOT remove file path    Answer:   \\[email or web address]  . CT CHEST W CONTRAST    Standing Status:   Future    Standing Expiration Date:   07/22/2019    Order Specific Question:   If indicated for the ordered procedure, I authorize the administration of contrast media per Radiology protocol    Answer:   Yes    Order Specific Question:   Preferred imaging location?    Answer:  [facility]    Order Specific Question:   Radiology Contrast Protocol - do NOT remove file path    Answer:   \\[email or web address]  . CT ABDOMEN PELVIS W CONTRAST    Standing Status:   Future    Standing Expiration Date:   07/22/2019    Order Specific Question:   If indicated for the ordered procedure, I authorize the administration of contrast media per Radiology protocol    Answer:   Yes    Order Specific Question:   Preferred imaging location?     Answer:   [facility] Specific Question:   Is Oral Contrast requested for this exam?    Answer:   Yes, Per Radiology protocol    Order Specific Question:   Radiology Contrast Protocol - do NOT remove file path    Answer:   \\[email or web address]  . CBC with Differential ([place] Only)    Standing Status:   Future    Standing Expiration Date:   08/26/2019  . CMP ([facility] only)    Standing Status:   Future    Standing Expiration Date:   08/26/2019  . Lactate dehydrogenase    Standing Status:   Future    Standing Expiration Date:   08/26/2019    All questions were answered. The patient knows to call the clinic with any problems, questions or concerns. No barriers to learning was detected.  Return in 2 months for labs, imaging results and clinic appt.   [name], MD 07/22/2018 11:17 AM  CHIEF COMPLAINT: "I am doing much better"  INTERVAL HISTORY: Bradley Novak returns to clinic for follow-up of Hodgkin lymphoma s/p ABVD.  Patient reports that he is gaining weight and his appetite has returned to normal.  His mood has also returned back to normal, and he had stopped taking the Cymbalta several weeks ago.  He denies any constitutional symptoms.  He finished a course of antibiotics for dysuria after the last clinic visit, and he is urinary symptoms have resolved.  He spoke with Dr. grouped of gastroenterology, who recommended deferring colonoscopy for 1 to 2 months to allow time for recovery from recent chemotherapy.  He has not yet received a phone call from urology regarding his clinic appointment.  He denies any other complaint today.  SUMMARY OF ONCOLOGIC HISTORY: Oncology History  Hodgkin lymphoma ([place])  08/29/2017 Initial Diagnosis   Hodgkin lymphoma ([place])   10/07/2017 Imaging   CT neck w/ contrast: Enlarged nodes in the right jugular chain, posterior triangle, and supraclavicular fossa. The pattern is most concerning for lymphoma;  multiple nodes are superficial and amenable to biopsy.  CT CAP w/ contrast: 1. Right supraclavicular and subcarinal adenopathy is identified. Differential considerations include lymphoproliferative disorder, metastatic adenopathy, granulomatous inflammation/infection or reactive adenopathy. Consider further evaluation with tissue sampling. 2. No acute findings, mass or adenopathy within the abdomen. 3. Lungs are clear.  No evidence for pneumonia.  4.  Aortic Atherosclerosis (ICD10-I70.0).   10/25/2017 Procedure   US-guided R cervical LN biopsy   10/25/2017 Pathology Results   (Accession: [id number]) Lymph node, needle/core biopsy, Right Cervical - CLASSIC HODGKIN LYMPHOMA - SEE COMMENT - CD30 (1%) - Unable to subtype due to limited sample specimen   11/12/2017 Cancer Staging   Staging form: Hodgkin and Non-Hodgkin Lymphoma, AJCC 8th Edition - Clinical stage from 11/12/2017: Stage II (Hodgkin lymphoma, A - Asymptomatic) - Signed by [name], MD on 11/12/2017   11/21/2017 -  Chemotherapy  The patient had DOXOrubicin (ADRIAMYCIN) chemo injection 60 mg, 25 mg/m2 = 60 mg, Intravenous,  Once, 0 of 4 cycles palonosetron (ALOXI) injection 0.25 mg, 0.25 mg, Intravenous,  Once, 0 of 4 cycles bleomycin (BLEOCIN) 24 Units in sodium chloride 0.9 % 50 mL chemo infusion, 10 Units/m2 = 24 Units, Intravenous,  Once, 0 of 4 cycles dacarbazine (DTIC) 910 mg in sodium chloride 0.9 % 250 mL chemo infusion, 375 mg/m2 = 910 mg, Intravenous,  Once, 0 of 4 cycles vinBLAStine (VELBAN) 14.6 mg in sodium chloride 0.9 % 50 mL chemo infusion, 6 mg/m2 = 14.6 mg, Intravenous, Once, 0 of 4 cycles fosaprepitant (EMEND) 150 mg, dexamethasone (DECADRON) 12 mg in sodium chloride 0.9 % 145 mL IVPB, , Intravenous,  Once, 0 of 4 cycles  for chemotherapy treatment.    11/21/2017 Imaging   PET: IMPRESSION: 1. Multiple relatively small hypermetabolic lymph nodes in the RIGHT neck, mediastinum and limited involvement of the  upper abdomen. Lymph node activity is high (greater than liver, Deauville 4). 2. The most intense enlarged lymph node is anterior to the sternocleidomastoid muscle on the RIGHT. There are multiple assessable lymph nodes in the RIGHT cervical chain. 3. Minimal hypermetabolic adenopathy in the upper abdomen. No pelvic or inguinal metastatic adenopathy. 4. Normal spleen and bone marrow. 5. Single focus of metabolic activity in the sigmoid colon. Recommend screening colonoscopy if patient is not current for such.   01/10/2018 Imaging   PET (after 2 cycles of ABVD) IMPRESSION: 1. Interval complete response to therapy. No abnormal areas of persistent hypermetabolic adenopathy identified. 2.  Aortic Atherosclerosis (ICD10-I70.0). 3. Multi vessel coronary artery atherosclerotic calcifications.   05/16/2018 Imaging   Post-treatment PET: IMPRESSION: 1. Small right cervical lymph nodes have only very low-grade activity, about half that of blood pool, and are small in size. Technically these are Deauville 2 as the lymph nodes have not completely resolved. 2. New focal hyperintensity in the sigmoid colon, maximum SUV 32.8 (Deauville 5). This was not present on 01/10/2018 and so presumably would represent physiologic activity, but the focal nature is unusual and a polyp is difficult to totally exclude. Correlate with colon screening history in determining whether further colon workup is warranted. 3. Prominent activity (Deauville 5) in the prostate gland, especially the peripheral zone, which is new and is associated with new periprostatic stranding. Prostatitis is favored given this constellation of findings.     REVIEW OF SYSTEMS:   Constitutional: ( - ) fevers, ( - )  chills , ( - ) night sweats Eyes: ( - ) blurriness of vision, ( - ) double vision, ( - ) watery eyes Ears, nose, mouth, throat, and face: ( - ) mucositis, ( - ) sore throat Respiratory: ( - ) cough, ( - ) dyspnea, ( - )  wheezes Cardiovascular: ( - ) palpitation, ( - ) chest discomfort, ( - ) lower extremity swelling Gastrointestinal:  ( - ) nausea, ( - ) heartburn, ( - ) change in bowel habits Skin: ( - ) abnormal skin rashes Lymphatics: ( - ) new lymphadenopathy, ( - ) easy bruising Neurological: ( - ) numbness, ( - ) tingling, ( - ) new weaknesses Behavioral/Psych: ( - ) mood change, ( - ) new changes  All other systems were reviewed with the patient and are negative.  I have reviewed the past medical history, past surgical history, social history and family history with the patient and they are unchanged from previous note.  ALLERGIES:  has No  Known Allergies.  MEDICATIONS:  Current Outpatient Medications  Medication Sig Dispense Refill  . aspirin EC 81 MG tablet Take 1 tablet (81 mg total) by mouth daily. 90 tablet 3  . atorvastatin (LIPITOR) 20 MG tablet TAKE 1 TABLET BY MOUTH DAILY (Patient taking differently: Take 20 mg by mouth daily. ) 90 tablet 2  . Cholecalciferol (VITAMIN D PO) Take 1 capsule by mouth at bedtime.    Marland Kitchen esomeprazole (NEXIUM) 20 MG capsule Take 20 mg by mouth 2 (two) times daily.    . metoprolol succinate (TOPROL XL) 100 MG 24 hr tablet Take 1 tablet (100 mg total) by mouth 2 (two) times daily. Take with or immediately following a meal. 180 tablet 3  . vitamin B-12 (CYANOCOBALAMIN) 100 MCG tablet Take 100 mcg by mouth daily.    Marland Kitchen diltiazem (CARDIZEM CD) 120 MG 24 hr capsule Take 1 capsule (120 mg total) by mouth daily. 90 capsule 1  . nitroGLYCERIN (NITROSTAT) 0.3 MG SL tablet Place 1 tablet (0.3 mg total) under the tongue every 5 (five) minutes as needed for chest pain. (Patient not taking: Reported on 07/22/2018) 30 tablet 0  . potassium chloride SA (K-DUR) 20 MEQ tablet Take 1 tablet (20 mEq total) by mouth daily. 90 tablet 1   No current facility-administered medications for this visit.     PHYSICAL EXAMINATION: ECOG PERFORMANCE STATUS: 1 - Symptomatic but completely  ambulatory  Today's Vitals   07/22/18 1055  BP: (!) 132/93  Pulse: (!) 58  Resp: 17  SpO2: 100%   There is no height or weight on file to calculate BMI.  There were no vitals filed for this visit.  GENERAL: alert, no distress and comfortable SKIN: skin color, texture, turgor are normal, no rashes or significant lesions EYES: conjunctiva are pink and non-injected, sclera clear NECK: supple, non-tender LYMPH:  no palpable lymphadenopathy in the cervical LUNGS: clear to auscultation with normal breathing effort HEART: regular rate & rhythm and no murmurs and no lower extremity edema ABDOMEN: soft, non-tender, non-distended, normal bowel sounds Musculoskeletal: no cyanosis of digits and no clubbing  PSYCH: alert & oriented x 3, fluent speech NEURO: no focal motor/sensory deficits  LABORATORY DATA:  I have reviewed the data as listed    Component Value Date/Time   NA 137 07/22/2018 1038   NA 141 08/29/2017 1155   K 3.8 07/22/2018 1038   CL 103 07/22/2018 1038   CO2 24 07/22/2018 1038   GLUCOSE 126 (H) 07/22/2018 1038   BUN 18 07/22/2018 1038   BUN 16 08/29/2017 1155   CREATININE 0.84 07/22/2018 1038   CREATININE 0.83 09/27/2014 0001   CALCIUM 8.9 07/22/2018 1038   PROT 6.8 07/22/2018 1038   PROT 7.4 08/29/2017 1155   ALBUMIN 4.1 07/22/2018 1038   ALBUMIN 4.1 08/29/2017 1155   AST 15 07/22/2018 1038   ALT 15 07/22/2018 1038   ALKPHOS 69 07/22/2018 1038   BILITOT 0.5 07/22/2018 1038   GFRNONAA >60 07/22/2018 1038   GFRAA >60 07/22/2018 1038    No results found for: SPEP, UPEP  Lab Results  Component Value Date   WBC 7.2 07/22/2018   NEUTROABS 4.8 07/22/2018   HGB 13.5 07/22/2018   HCT 41.9 07/22/2018   MCV 85.2 07/22/2018   PLT 205 07/22/2018      Chemistry      Component Value Date/Time   NA 137 07/22/2018 1038   NA 141 08/29/2017 1155   K 3.8 07/22/2018 1038   CL 103  07/22/2018 1038   CO2 24 07/22/2018 1038   BUN 18 07/22/2018 1038   BUN 16  08/29/2017 1155   CREATININE 0.84 07/22/2018 1038   CREATININE 0.83 09/27/2014 0001      Component Value Date/Time   CALCIUM 8.9 07/22/2018 1038   ALKPHOS 69 07/22/2018 1038   AST 15 07/22/2018 1038   ALT 15 07/22/2018 1038   BILITOT 0.5 07/22/2018 1038

## 2018-07-22 ENCOUNTER — Encounter: Payer: Self-pay | Admitting: Hematology

## 2018-07-22 ENCOUNTER — Telehealth: Payer: Self-pay | Admitting: Hematology

## 2018-07-22 ENCOUNTER — Other Ambulatory Visit: Payer: Self-pay

## 2018-07-22 ENCOUNTER — Inpatient Hospital Stay (HOSPITAL_BASED_OUTPATIENT_CLINIC_OR_DEPARTMENT_OTHER): Payer: Medicare Other | Admitting: Hematology

## 2018-07-22 ENCOUNTER — Inpatient Hospital Stay: Payer: Medicare Other

## 2018-07-22 ENCOUNTER — Inpatient Hospital Stay: Payer: Medicare Other | Attending: Hematology

## 2018-07-22 VITALS — BP 132/93 | HR 58 | Resp 17

## 2018-07-22 DIAGNOSIS — R9349 Abnormal radiologic findings on diagnostic imaging of other urinary organs: Secondary | ICD-10-CM | POA: Insufficient documentation

## 2018-07-22 DIAGNOSIS — C8190 Hodgkin lymphoma, unspecified, unspecified site: Secondary | ICD-10-CM

## 2018-07-22 DIAGNOSIS — I700 Atherosclerosis of aorta: Secondary | ICD-10-CM | POA: Insufficient documentation

## 2018-07-22 DIAGNOSIS — Z7982 Long term (current) use of aspirin: Secondary | ICD-10-CM | POA: Insufficient documentation

## 2018-07-22 DIAGNOSIS — R635 Abnormal weight gain: Secondary | ICD-10-CM

## 2018-07-22 DIAGNOSIS — Z79899 Other long term (current) drug therapy: Secondary | ICD-10-CM | POA: Insufficient documentation

## 2018-07-22 DIAGNOSIS — E876 Hypokalemia: Secondary | ICD-10-CM | POA: Insufficient documentation

## 2018-07-22 DIAGNOSIS — R933 Abnormal findings on diagnostic imaging of other parts of digestive tract: Secondary | ICD-10-CM | POA: Insufficient documentation

## 2018-07-22 DIAGNOSIS — R948 Abnormal results of function studies of other organs and systems: Secondary | ICD-10-CM

## 2018-07-22 LAB — CBC WITH DIFFERENTIAL (CANCER CENTER ONLY)
Abs Immature Granulocytes: 0.01 10*3/uL (ref 0.00–0.07)
Basophils Absolute: 0 10*3/uL (ref 0.0–0.1)
Basophils Relative: 1 %
Eosinophils Absolute: 0.1 10*3/uL (ref 0.0–0.5)
Eosinophils Relative: 2 %
HCT: 41.9 % (ref 39.0–52.0)
Hemoglobin: 13.5 g/dL (ref 13.0–17.0)
Immature Granulocytes: 0 %
Lymphocytes Relative: 25 %
Lymphs Abs: 1.8 10*3/uL (ref 0.7–4.0)
MCH: 27.4 pg (ref 26.0–34.0)
MCHC: 32.2 g/dL (ref 30.0–36.0)
MCV: 85.2 fL (ref 80.0–100.0)
Monocytes Absolute: 0.5 10*3/uL (ref 0.1–1.0)
Monocytes Relative: 7 %
Neutro Abs: 4.8 10*3/uL (ref 1.7–7.7)
Neutrophils Relative %: 65 %
Platelet Count: 205 10*3/uL (ref 150–400)
RBC: 4.92 MIL/uL (ref 4.22–5.81)
RDW: 14.3 % (ref 11.5–15.5)
WBC Count: 7.2 10*3/uL (ref 4.0–10.5)
nRBC: 0 % (ref 0.0–0.2)

## 2018-07-22 LAB — CMP (CANCER CENTER ONLY)
ALT: 15 U/L (ref 0–44)
AST: 15 U/L (ref 15–41)
Albumin: 4.1 g/dL (ref 3.5–5.0)
Alkaline Phosphatase: 69 U/L (ref 38–126)
Anion gap: 10 (ref 5–15)
BUN: 18 mg/dL (ref 8–23)
CO2: 24 mmol/L (ref 22–32)
Calcium: 8.9 mg/dL (ref 8.9–10.3)
Chloride: 103 mmol/L (ref 98–111)
Creatinine: 0.84 mg/dL (ref 0.61–1.24)
GFR, Est AFR Am: >60 mL/min (ref >60)
GFR, Estimated: >60 mL/min (ref >60)
Glucose, Bld: 126 mg/dL — ABNORMAL HIGH (ref 70–99)
Potassium: 3.8 mmol/L (ref 3.5–5.1)
Sodium: 137 mmol/L (ref 135–145)
Total Bilirubin: 0.5 mg/dL (ref 0.3–1.2)
Total Protein: 6.8 g/dL (ref 6.5–8.1)

## 2018-07-22 LAB — LACTATE DEHYDROGENASE: LDH: 127 U/L (ref 98–192)

## 2018-07-22 MED ORDER — POTASSIUM CHLORIDE CRYS ER 20 MEQ PO TBCR: 20.0000 meq | EXTENDED_RELEASE_TABLET | Freq: Every day | ORAL | 1 refills | Status: DC

## 2018-07-22 NOTE — Telephone Encounter (Signed)
Called and spoke with patient regarding appointments added per 7/7 los

## 2018-08-18 ENCOUNTER — Encounter: Payer: Self-pay | Admitting: Hematology

## 2018-08-18 ENCOUNTER — Telehealth: Payer: Self-pay | Admitting: *Deleted

## 2018-08-18 NOTE — Telephone Encounter (Signed)
[facility] s/w Maudie Mercury who advised they have the referral but are missing demographics. refaxed referral information to [phone]

## 2018-08-19 DIAGNOSIS — M170 Bilateral primary osteoarthritis of knee: Secondary | ICD-10-CM | POA: Diagnosis not present

## 2018-08-26 DIAGNOSIS — M170 Bilateral primary osteoarthritis of knee: Secondary | ICD-10-CM | POA: Diagnosis not present

## 2018-09-05 ENCOUNTER — Other Ambulatory Visit: Payer: Self-pay | Admitting: Nurse Practitioner

## 2018-09-05 MED ORDER — DILTIAZEM HCL ER COATED BEADS 120 MG PO CP24: 120.0000 mg | ORAL_CAPSULE | Freq: Every day | ORAL | 2 refills | Status: DC

## 2018-09-09 ENCOUNTER — Ambulatory Visit: Payer: Medicare Other | Admitting: Cardiology

## 2018-09-09 DIAGNOSIS — M170 Bilateral primary osteoarthritis of knee: Secondary | ICD-10-CM | POA: Diagnosis not present

## 2018-09-24 ENCOUNTER — Encounter: Payer: Self-pay | Admitting: Cardiology

## 2018-09-24 ENCOUNTER — Other Ambulatory Visit: Payer: Self-pay

## 2018-09-24 ENCOUNTER — Ambulatory Visit (INDEPENDENT_AMBULATORY_CARE_PROVIDER_SITE_OTHER): Payer: Medicare Other | Admitting: Cardiology

## 2018-09-24 VITALS — BP 150/92 | HR 57 | Ht 71.0 in | Wt 270.2 lb

## 2018-09-24 DIAGNOSIS — I471 Supraventricular tachycardia: Secondary | ICD-10-CM

## 2018-09-24 DIAGNOSIS — Z23 Encounter for immunization: Secondary | ICD-10-CM | POA: Diagnosis not present

## 2018-09-24 MED ORDER — LOSARTAN POTASSIUM 50 MG PO TABS: 50.0000 mg | ORAL_TABLET | Freq: Every day | ORAL | 6 refills | Status: DC

## 2018-09-24 MED ORDER — CARVEDILOL 25 MG PO TABS: 25.0000 mg | ORAL_TABLET | Freq: Two times a day (BID) | ORAL | 6 refills | Status: DC

## 2018-09-24 NOTE — Progress Notes (Signed)
Electrophysiology Office Note   Date:  09/24/2018   ID:  Bradley Novak, Bradley Novak [birth year], MRN [id number]  PCP:  Carlena Hurl, PA-C  Cardiologist:  Nahser Primary Electrophysiologist:  Nori Poland Meredith Leeds, MD    No chief complaint on file.    History of Present Illness: Bradley Novak is a 68 y.o. male who is being seen today for the evaluation of SVT at the request of Nahser. Presenting today for electrophysiology evaluation.  He has a history of Hodgkin's lymphoma, hypertension, and ORT.  He underwent catheter ablation 01/29/2018 with successful ablation of an accessory pathway at CS 5 6.  Unfortunately he came back the next day with recurrent SVT.  Today, denies symptoms of palpitations, chest pain, shortness of breath, orthopnea, PND, lower extremity edema, claudication, dizziness, presyncope, syncope, bleeding, or neurologic sequela. The patient is tolerating medications without difficulties.     Past Medical History:  Diagnosis Date  . GERD (gastroesophageal reflux disease)   . History of chemotherapy    7 months per patient  . History of hiatal hernia   . Hodgkin's lymphoma ([place]) 09/2017  . Hyperlipidemia   . Hypertension 2006  . Obesity   . Pneumonia 09/2017  . SVT (supraventricular tachycardia) ([place])   . Wears glasses    Past Surgical History:  Procedure Laterality Date  . CARDIAC CATHETERIZATION  1980s  . IR IMAGING GUIDED PORT INSERTION  11/20/2017  . IR REMOVAL TUN ACCESS W/ PORT W/O FL MOD SED  06/02/2018  . SVT ABLATION  01/29/2018  . SVT ABLATION N/A 01/29/2018   Procedure: SVT ABLATION;  Surgeon: Constance Haw, MD;  Location: [facility];  Service: Cardiovascular;  Laterality: N/A;     Current Outpatient Medications  Medication Sig Dispense Refill  . aspirin EC 81 MG tablet Take 1 tablet (81 mg total) by mouth daily. 90 tablet 3  . atorvastatin (LIPITOR) 20 MG tablet TAKE 1 TABLET BY MOUTH DAILY (Patient taking differently: Take 20  mg by mouth daily. ) 90 tablet 2  . Cholecalciferol (VITAMIN D PO) Take 1 capsule by mouth at bedtime.    Marland Kitchen diltiazem (CARDIZEM CD) 120 MG 24 hr capsule Take 1 capsule (120 mg total) by mouth daily. 30 capsule 2  . esomeprazole (NEXIUM) 20 MG capsule Take 20 mg by mouth 2 (two) times daily.    . metoprolol succinate (TOPROL XL) 100 MG 24 hr tablet Take 1 tablet (100 mg total) by mouth 2 (two) times daily. Take with or immediately following a meal. 180 tablet 3  . nitroGLYCERIN (NITROSTAT) 0.3 MG SL tablet Place 1 tablet (0.3 mg total) under the tongue every 5 (five) minutes as needed for chest pain. (Patient not taking: Reported on 07/22/2018) 30 tablet 0  . potassium chloride SA (K-DUR) 20 MEQ tablet Take 1 tablet (20 mEq total) by mouth daily. 90 tablet 1  . vitamin B-12 (CYANOCOBALAMIN) 100 MCG tablet Take 100 mcg by mouth daily.     No current facility-administered medications for this visit.     Allergies:   Patient has no known allergies.   Social History:  The patient  reports that he quit smoking about 46 years ago. His smoking use included cigarettes. He has a 10.00 pack-year smoking history. He has never used smokeless tobacco. He reports that he does not drink alcohol or use drugs.   Family History:  The patient's family history includes Hypertension in his brother, brother, brother, brother, and father.  ROS:  Please see the history of present illness.   Otherwise, review of systems is positive for none.   All other systems are reviewed and negative.   PHYSICAL EXAM: VS:  There were no vitals taken for this visit. , BMI There is no height or weight on file to calculate BMI. GEN: Well nourished, well developed, in no acute distress  HEENT: normal  Neck: no JVD, carotid bruits, or masses Cardiac: RRR; no murmurs, rubs, or gallops,no edema  Respiratory:  clear to auscultation bilaterally, normal work of breathing GI: soft, nontender, nondistended, + BS MS: no deformity or  atrophy  Skin: warm and dry Neuro:  Strength and sensation are intact Psych: euthymic mood, full affect  EKG:  EKG is ordered today. Personal review of the ekg ordered shows sinus rhythm, rate 57, incomplete right bundle branch block  Recent Labs: 01/21/2018: TSH 1.149 04/14/2018: Magnesium 1.7 07/22/2018: ALT 15; BUN 18; Creatinine 0.84; Hemoglobin 13.5; Platelet Count 205; Potassium 3.8; Sodium 137    Lipid Panel     Component Value Date/Time   CHOL 131 08/29/2017 1155   TRIG 85 08/29/2017 1155   HDL 44 08/29/2017 1155   CHOLHDL 3.0 08/29/2017 1155   CHOLHDL 5.1 (H) 09/27/2014 0001   VLDL 37 (H) 09/27/2014 0001   LDLCALC 70 08/29/2017 1155     Wt Readings from Last 3 Encounters:  05/22/18 250 lb (113.4 kg)  05/19/18 248 lb (112.5 kg)  04/14/18 237 lb (107.5 kg)      Other studies Reviewed: Additional studies/ records that were reviewed today include: TTE 11/15/17  Review of the above records today demonstrates:  - Left ventricle: The cavity size was normal. Systolic function was   normal. The estimated ejection fraction was in the range of 55%   to 60%. Wall motion was normal; there were no regional wall   motion abnormalities. Left ventricular diastolic function   parameters were normal.   ASSESSMENT AND PLAN:  1.  ORT: This post ablation 01/29/2018 with recurrences of likely the same tachycardia.  He has had no further episodes of tachycardia since his chemotherapy for lymphoma has been completed.  We [name] thus plan to stop his metoprolol and diltiazem and start him on carvedilol 25 mg.    2.  Hypertension: Blood pressure is elevated today.  We [name] start losartan.     Current medicines are reviewed at length with the patient today.   The patient does not have concerns regarding his medicines.  The following changes were made today: Stop metoprolol, diltiazem.  Start losartan, carvedilol  Labs/ tests ordered today include:  No orders of the defined types were  placed in this encounter.    Disposition:   FU with [name] 6 months  Signed, [name], MD  09/24/2018 10:49 AM     [facility] [address] [place] [address] [phone] (office) [phone] (fax)

## 2018-09-24 NOTE — Patient Instructions (Addendum)
Medication Instructions:  Your physician has recommended you make the following change in your medication:  1. STOP Metoprolol 2. STOP Diltiazem 3. START Carvedilol (Coreg) 25 mg twice a day 4. START Losartan 50 mg once daily  * If you need a refill on your cardiac medications before your next appointment, please call your pharmacy.   Labwork: None ordered  Testing/Procedures: None ordered  Follow-Up: Your physician wants you to follow-up in: 6 months with Dr. Curt Bears.  You will receive a reminder letter in the mail two months in advance. If you don't receive a letter, please call our office to schedule the follow-up appointment.  Thank you for choosing [facility]!!   Trinidad Curet, RN [phone]  Any Other Special Instructions Will Be Listed Below (If Applicable).  Carvedilol tablets What is this medicine? CARVEDILOL (KAR ve dil ol) is a beta-blocker. Beta-blockers reduce the workload on the heart and help it to beat more regularly. This medicine is used to treat high blood pressure and heart failure. This medicine may be used for other purposes; ask your health care provider or pharmacist if you have questions. COMMON BRAND NAME(S): Coreg What should I tell my health care provider before I take this medicine? They need to know if you have any of these conditions:  circulation problems  diabetes  history of heart attack or heart disease  liver disease  lung or breathing disease, like asthma or emphysema  pheochromocytoma  slow or irregular heartbeat  thyroid disease  an unusual or allergic reaction to carvedilol, other beta-blockers, medicines, foods, dyes, or preservatives  pregnant or trying to get pregnant  breast-feeding How should I use this medicine? Take this medicine by mouth with a glass of water. Follow the directions on the prescription label. It is best to take the tablets with food. Take your doses at regular intervals. Do not take your  medicine more often than directed. Do not stop taking except on the advice of your doctor or health care professional. Talk to your pediatrician regarding the use of this medicine in children. Special care may be needed. Overdosage: If you think you have taken too much of this medicine contact a poison control center or emergency room at once. NOTE: This medicine is only for you. Do not share this medicine with others. What if I miss a dose? If you miss a dose, take it as soon as you can. If it is almost time for your next dose, take only that dose. Do not take double or extra doses. What may interact with this medicine? This medicine may interact with the following medications:  certain medicines for blood pressure, heart disease, irregular heart beat  certain medicines for depression, like fluoxetine or paroxetine  certain medicines for diabetes, like glipizide or glyburide  cimetidine  clonidine  cyclosporine  digoxin  MAOIs like Carbex, Eldepryl, Marplan, Nardil, and Parnate  reserpine  rifampin This list may not describe all possible interactions. Give your health care provider a list of all the medicines, herbs, non-prescription drugs, or dietary supplements you use. Also tell them if you smoke, drink alcohol, or use illegal drugs. Some items may interact with your medicine. What should I watch for while using this medicine? Check your heart rate and blood pressure regularly while you are taking this medicine. Ask your doctor or health care professional what your heart rate and blood pressure should be, and when you should contact him or her. Do not stop taking this medicine suddenly. This  could lead to serious heart-related effects. Contact your doctor or health care professional if you have difficulty breathing while taking this drug. Check your weight daily. Ask your doctor or health care professional when you should notify him/her of any weight gain. You may get drowsy or  dizzy. Do not drive, use machinery, or do anything that requires mental alertness until you know how this medicine affects you. To reduce the risk of dizzy or fainting spells, do not sit or stand up quickly. Alcohol can make you more drowsy, and increase flushing and rapid heartbeats. Avoid alcoholic drinks. This medicine may increase blood sugar. Ask your healthcare provider if changes in diet or medicines are needed if you have diabetes. If you are going to have surgery, tell your doctor or health care professional that you are taking this medicine. What side effects may I notice from receiving this medicine? Side effects that you should report to your doctor or health care professional as soon as possible:  allergic reactions like skin rash, itching or hives, swelling of the face, lips, or tongue  breathing problems  dark urine  irregular heartbeat   signs and symptoms of high blood sugar such as being more thirsty or hungry or having to urinate more than normal. You may also feel very tired or have blurry vision.  swollen legs or ankles  vomiting  yellowing of the eyes or skin Side effects that usually do not require medical attention (report to your doctor or health care professional if they continue or are bothersome):  change in sex drive or performance  diarrhea  dry eyes (especially if wearing contact lenses)  dry, itching skin  headache  nausea  unusually tired This list may not describe all possible side effects. Call your doctor for medical advice about side effects. You may report side effects to FDA at 1-800-FDA-1088. Where should I keep my medicine? Keep out of the reach of children. Store at room temperature below 30 degrees C (86 degrees F). Protect from moisture. Keep container tightly closed. Throw away any unused medicine after the expiration date. NOTE: This sheet is a summary. It may not cover all possible information. If you have questions about this  medicine, talk to your doctor, pharmacist, or health care provider.  2020 Elsevier/Gold Standard (2017-10-23 09:13:57)   Losartan tablets What is this medicine? LOSARTAN (loe SAR tan) is used to treat high blood pressure and to reduce the risk of stroke in certain patients. This drug also slows the progression of kidney disease in patients with diabetes. This medicine may be used for other purposes; ask your health care provider or pharmacist if you have questions. COMMON BRAND NAME(S): Cozaar What should I tell my health care provider before I take this medicine? They need to know if you have any of these conditions:  heart failure  kidney or liver disease  an unusual or allergic reaction to losartan, other medicines, foods, dyes, or preservatives  pregnant or trying to get pregnant  breast-feeding How should I use this medicine? Take this medicine by mouth with a glass of water. Follow the directions on the prescription label. This medicine can be taken with or without food. Take your doses at regular intervals. Do not take your medicine more often than directed. Talk to your pediatrician regarding the use of this medicine in children. Special care may be needed. Overdosage: If you think you have taken too much of this medicine contact a poison control center or emergency room at  once. NOTE: This medicine is only for you. Do not share this medicine with others. What if I miss a dose? If you miss a dose, take it as soon as you can. If it is almost time for your next dose, take only that dose. Do not take double or extra doses. What may interact with this medicine?  blood pressure medicines  diuretics, especially triamterene, spironolactone, or amiloride  fluconazole  NSAIDs, medicines for pain and inflammation, like ibuprofen or naproxen  potassium salts or potassium supplements  rifampin This list may not describe all possible interactions. Give your health care provider a  list of all the medicines, herbs, non-prescription drugs, or dietary supplements you use. Also tell them if you smoke, drink alcohol, or use illegal drugs. Some items may interact with your medicine. What should I watch for while using this medicine? Visit your doctor or health care professional for regular checks on your progress. Check your blood pressure as directed. Ask your doctor or health care professional what your blood pressure should be and when you should contact him or her. Call your doctor or health care professional if you notice an irregular or fast heart beat. Women should inform their doctor if they wish to become pregnant or think they might be pregnant. There is a potential for serious side effects to an unborn child, particularly in the second or third trimester. Talk to your health care professional or pharmacist for more information. You may get drowsy or dizzy. Do not drive, use machinery, or do anything that needs mental alertness until you know how this drug affects you. Do not stand or sit up quickly, especially if you are an older patient. This reduces the risk of dizzy or fainting spells. Alcohol can make you more drowsy and dizzy. Avoid alcoholic drinks. Avoid salt substitutes unless you are told otherwise by your doctor or health care professional. Do not treat yourself for coughs, colds, or pain while you are taking this medicine without asking your doctor or health care professional for advice. Some ingredients may increase your blood pressure. What side effects may I notice from receiving this medicine? Side effects that you should report to your doctor or health care professional as soon as possible:  confusion, dizziness, light headedness or fainting spells  decreased amount of urine passed  difficulty breathing or swallowing, hoarseness, or tightening of the throat  fast or irregular heart beat, palpitations, or chest pain  skin rash, itching  swelling of your  face, lips, tongue, hands, or feet Side effects that usually do not require medical attention (report to your doctor or health care professional if they continue or are bothersome):  cough  decreased sexual function or desire  headache  nasal congestion or stuffiness  nausea or stomach pain  sore or cramping muscles This list may not describe all possible side effects. Call your doctor for medical advice about side effects. You may report side effects to FDA at 1-800-FDA-1088. Where should I keep my medicine? Keep out of the reach of children. Store at room temperature between 15 and 30 degrees C (59 and 86 degrees F). Protect from light. Keep container tightly closed. Throw away any unused medicine after the expiration date. NOTE: This sheet is a summary. It may not cover all possible information. If you have questions about this medicine, talk to your doctor, pharmacist, or health care provider.  2020 Elsevier/Gold Standard (2007-03-14 16:42:18)

## 2018-09-25 ENCOUNTER — Other Ambulatory Visit: Payer: Medicare Other

## 2018-09-25 ENCOUNTER — Encounter: Payer: Self-pay | Admitting: Hematology

## 2018-09-25 ENCOUNTER — Other Ambulatory Visit: Payer: Self-pay | Admitting: Hematology

## 2018-09-25 ENCOUNTER — Telehealth: Payer: Self-pay | Admitting: *Deleted

## 2018-09-25 ENCOUNTER — Telehealth: Payer: Self-pay | Admitting: Hematology

## 2018-09-25 ENCOUNTER — Ambulatory Visit (HOSPITAL_COMMUNITY): Admission: RE | Admit: 2018-09-25 | Payer: Medicare Other | Source: Ambulatory Visit

## 2018-09-25 ENCOUNTER — Ambulatory Visit (HOSPITAL_BASED_OUTPATIENT_CLINIC_OR_DEPARTMENT_OTHER)
Admission: RE | Admit: 2018-09-25 | Discharge: 2018-09-25 | Disposition: A | Discharge status: Home / Self Care | Payer: Medicare Other | Source: Ambulatory Visit | Attending: Hematology | Admitting: Hematology

## 2018-09-25 ENCOUNTER — Inpatient Hospital Stay (HOSPITAL_BASED_OUTPATIENT_CLINIC_OR_DEPARTMENT_OTHER): Payer: Medicare Other | Admitting: Hematology

## 2018-09-25 ENCOUNTER — Encounter (HOSPITAL_BASED_OUTPATIENT_CLINIC_OR_DEPARTMENT_OTHER): Payer: Self-pay

## 2018-09-25 ENCOUNTER — Other Ambulatory Visit: Payer: Self-pay

## 2018-09-25 ENCOUNTER — Inpatient Hospital Stay: Payer: Medicare Other | Attending: Hematology

## 2018-09-25 VITALS — BP 159/78 | HR 65 | Temp 97.9°F | Resp 19 | Ht 71.0 in | Wt 271.4 lb

## 2018-09-25 DIAGNOSIS — C8190 Hodgkin lymphoma, unspecified, unspecified site: Secondary | ICD-10-CM

## 2018-09-25 DIAGNOSIS — I2510 Atherosclerotic heart disease of native coronary artery without angina pectoris: Secondary | ICD-10-CM | POA: Diagnosis not present

## 2018-09-25 DIAGNOSIS — Z719 Counseling, unspecified: Secondary | ICD-10-CM

## 2018-09-25 DIAGNOSIS — Z9221 Personal history of antineoplastic chemotherapy: Secondary | ICD-10-CM | POA: Diagnosis not present

## 2018-09-25 DIAGNOSIS — Z7982 Long term (current) use of aspirin: Secondary | ICD-10-CM | POA: Diagnosis not present

## 2018-09-25 DIAGNOSIS — Z8572 Personal history of non-Hodgkin lymphomas: Secondary | ICD-10-CM | POA: Diagnosis not present

## 2018-09-25 DIAGNOSIS — Z79899 Other long term (current) drug therapy: Secondary | ICD-10-CM | POA: Diagnosis not present

## 2018-09-25 DIAGNOSIS — R948 Abnormal results of function studies of other organs and systems: Secondary | ICD-10-CM | POA: Diagnosis not present

## 2018-09-25 DIAGNOSIS — N419 Inflammatory disease of prostate, unspecified: Secondary | ICD-10-CM | POA: Insufficient documentation

## 2018-09-25 DIAGNOSIS — R9389 Abnormal findings on diagnostic imaging of other specified body structures: Secondary | ICD-10-CM | POA: Diagnosis not present

## 2018-09-25 DIAGNOSIS — R5383 Other fatigue: Secondary | ICD-10-CM

## 2018-09-25 DIAGNOSIS — I700 Atherosclerosis of aorta: Secondary | ICD-10-CM | POA: Insufficient documentation

## 2018-09-25 LAB — CMP (CANCER CENTER ONLY)
ALT: 18 U/L (ref 0–44)
AST: 19 U/L (ref 15–41)
Albumin: 4.1 g/dL (ref 3.5–5.0)
Alkaline Phosphatase: 67 U/L (ref 38–126)
Anion gap: 9 (ref 5–15)
BUN: 18 mg/dL (ref 8–23)
CO2: 24 mmol/L (ref 22–32)
Calcium: 8.9 mg/dL (ref 8.9–10.3)
Chloride: 105 mmol/L (ref 98–111)
Creatinine: 0.9 mg/dL (ref 0.61–1.24)
GFR, Est AFR Am: >60 mL/min (ref >60)
GFR, Estimated: >60 mL/min (ref >60)
Glucose, Bld: 116 mg/dL — ABNORMAL HIGH (ref 70–99)
Potassium: 4.1 mmol/L (ref 3.5–5.1)
Sodium: 138 mmol/L (ref 135–145)
Total Bilirubin: 0.7 mg/dL (ref 0.3–1.2)
Total Protein: 6.6 g/dL (ref 6.5–8.1)

## 2018-09-25 LAB — CBC WITH DIFFERENTIAL (CANCER CENTER ONLY)
Abs Immature Granulocytes: 0.03 10*3/uL (ref 0.00–0.07)
Basophils Absolute: 0 10*3/uL (ref 0.0–0.1)
Basophils Relative: 1 %
Eosinophils Absolute: 0.1 10*3/uL (ref 0.0–0.5)
Eosinophils Relative: 1 %
HCT: 40.2 % (ref 39.0–52.0)
Hemoglobin: 13.4 g/dL (ref 13.0–17.0)
Immature Granulocytes: 0 %
Lymphocytes Relative: 9 %
Lymphs Abs: 0.8 10*3/uL (ref 0.7–4.0)
MCH: 28.8 pg (ref 26.0–34.0)
MCHC: 33.3 g/dL (ref 30.0–36.0)
MCV: 86.5 fL (ref 80.0–100.0)
Monocytes Absolute: 0.5 10*3/uL (ref 0.1–1.0)
Monocytes Relative: 6 %
Neutro Abs: 7.2 10*3/uL (ref 1.7–7.7)
Neutrophils Relative %: 83 %
Platelet Count: 196 10*3/uL (ref 150–400)
RBC: 4.65 MIL/uL (ref 4.22–5.81)
RDW: 14.7 % (ref 11.5–15.5)
WBC Count: 8.7 10*3/uL (ref 4.0–10.5)
nRBC: 0 % (ref 0.0–0.2)

## 2018-09-25 LAB — LACTATE DEHYDROGENASE: LDH: 138 U/L (ref 98–192)

## 2018-09-25 LAB — TSH: TSH: 3.202 u[IU]/mL (ref 0.320–4.118)

## 2018-09-25 MED ORDER — IOHEXOL 300 MG/ML  SOLN
100.0000 mL | Freq: Once | INTRAMUSCULAR | Status: AC | PRN
  Administered 2018-09-25: 100 mL via INTRAVENOUS

## 2018-09-25 NOTE — Telephone Encounter (Signed)
-----   Message from [name], MD sent at 09/25/2018  1:38 PM EDT ----- Can you let Mr. Smyser know that his TSH is normal?Thanks.  [place] ----- Message ----- From: Buel Ream, [facility] [place] Sent: 09/25/2018   8:47 AM EDT To: [name], MD

## 2018-09-25 NOTE — Telephone Encounter (Signed)
As noted below by Dr. Maylon Peppers, I informed patient that his TSH is normal. He verbalized understanding.

## 2018-09-25 NOTE — Progress Notes (Signed)
[name] OFFICE PROGRESS NOTE  Patient Care Team: Tysinger, Camelia Eng, PA-C as PCP - General (Family Medicine) Nahser, Wonda Cheng, MD as PCP - Cardiology (Cardiology) Constance Haw, MD as PCP - Electrophysiology (Cardiology) HEME/ONC OVERVIEW: 1. Stage III classic Hodgkin lymphoma, subtype NOS due to limited specimen; currently NED  -Bx-proven R cervical LN involvement in 09/2017; PET in 11/2017 showed cervical, mediastinal and upper abdominal LN involement; treatment delayed due to pt traveling on a cruise after diagnosis -11/2017 - 01/2018: ABVD x 2 cycles  12/2017: PET after 2 cycles of ABVD showed interval CR  -Chemotherapy delayed due to SVT requiring ablation in 01/2018 -01/2018 - 03/2018: AVD x two and a half cycles; patient declined further treatment due to poor tolerance  -05/2018: end-of treatment PET showed CR (0.8cm R cervical LN, Deauville 2; no marrow lesion)  2. Port placement in 11/2017   CHEMOTHERAPY REGIMEN:  11/26/2017 - 01/07/2018: ABVD x 2 cycles, CR  02/04/2018 - 04/01/2018: AVD x two and a half cycles; patient declined further treatment due to poor tolerance   ASSESSMENT & PLAN:   Stage III classic Hodgkin lymphoma, favorable risk  -Clinically, patient is doing well without any constitutional symptoms -I independently reviewed the radiologic images of CT neck, chest, and abdomen/pelvis, and agree with findings as documented -In summary, CT did not demonstrate any definite recurrent lymphoma.  There were several subcentimeter left axillary LN's (largest measuring 67mm), which are not pathologic by size.  In addition, review of the previous PET showed that the disease was confined to the right cervical LN's as well as questionable mediastinal and hilar LN's, but there was no axillary LN involvement. -Therefore, in the absence of any clinically suspicious symptoms, I discussed with the patient that the subcentimeter left axillary LN's are most  likely benign; furthermore, the small size of LN's are below PET detection, and may be missed on PET scan -I counseled the patient on any concerning symptoms, including persistent fever, night sweats, unexplained weight loss, or enlarging LN's, for which he is instructed to contact us anytime -Based on CR, follow-up will be as below:  Labs and exam q3-58months x 1-2 years, then q6-51months until Year 3, and then annually  CT neck, chest, and AP at 6, 12 and 24 months following completion of therapy   Annual influenza vaccine  -I have ordered CT neck, chest and AP for mid-09/2018  Abnormal sigmoid colon uptake -Followed by Dr. Lyndel Safe of GI; colonoscopy deferred due to [name] and patient's wife recently having surgery -No suspicious GI symptoms  -I encouraged the patient to contact Dr. Steve Rattler office to set up colonoscopy as soon as able   Abnormal prostate uptake -Incidentally noted on post-PET, treated with abx for possible prostatitis  -Urology referral placed in 05/2018, but pt was finally contacted by urology in early 09/2018   Health maintenance -Patient recently received his influenza vaccine, and I encouraged him to be up-to-date with his age-appropriate vaccinations, including pneumonia and shingles  Orders Placed This Encounter  Procedures  . CBC with Differential (Cancer Center Only)    Standing Status:   Future    Standing Expiration Date:   10/30/2019  . CMP ([facility] only)    Standing Status:   Future    Standing Expiration Date:   10/30/2019  . Lactate dehydrogenase    Standing Status:   Future    Standing Expiration Date:   10/30/2019  . Lactate dehydrogenase    Standing Status:  Future    Standing Expiration Date:   10/30/2019    All questions were answered. The patient knows to call the clinic with any problems, questions or concerns. No barriers to learning was detected.  A total of more than 25 minutes were spent face-to-face with the patient during this  encounter and over half of that time was spent on counseling and coordination of care as outlined above.   Return in 3 months for labs and clinic follow-up.  [name], MD 09/25/2018 12:13 PM  CHIEF COMPLAINT: "I've gained some weight"  INTERVAL HISTORY: Bradley Novak returns clinic for follow-up of history of Hodgkin lymphoma.  Patient reports that he has gained a few pounds since completing chemotherapy and has not exercised as much due to [place].  He still has intermittent fatigue, but overall it is improving.  He denies any constitutional symptoms.  He does have periodic neck exams and does not feel any enlarged lymph nodes.  He got his influenza vaccine last week, and was wondering if he should receive his pneumonia and shingles vaccines.  He denies any other complaint today.  SUMMARY OF ONCOLOGIC HISTORY: Oncology History  Hodgkin lymphoma ([place])  08/29/2017 Initial Diagnosis   Hodgkin lymphoma ([place])   10/07/2017 Imaging   CT neck w/ contrast: Enlarged nodes in the right jugular chain, posterior triangle, and supraclavicular fossa. The pattern is most concerning for lymphoma; multiple nodes are superficial and amenable to biopsy.  CT CAP w/ contrast: 1. Right supraclavicular and subcarinal adenopathy is identified. Differential considerations include lymphoproliferative disorder, metastatic adenopathy, granulomatous inflammation/infection or reactive adenopathy. Consider further evaluation with tissue sampling. 2. No acute findings, mass or adenopathy within the abdomen. 3. Lungs are clear.  No evidence for pneumonia.  4.  Aortic Atherosclerosis (ICD10-I70.0).   10/25/2017 Procedure   US-guided R cervical LN biopsy   10/25/2017 Pathology Results   (Accession: [id number]) Lymph node, needle/core biopsy, Right Cervical - CLASSIC HODGKIN LYMPHOMA - SEE COMMENT - CD30 (1%) - Unable to subtype due to limited sample specimen   11/12/2017 Cancer Staging   Staging form: Hodgkin and  Non-Hodgkin Lymphoma, AJCC 8th Edition - Clinical stage from 11/12/2017: Stage II (Hodgkin lymphoma, A - Asymptomatic) - Signed by [name], MD on 11/12/2017   11/21/2017 -  Chemotherapy   The patient had DOXOrubicin (ADRIAMYCIN) chemo injection 60 mg, 25 mg/m2 = 60 mg, Intravenous,  Once, 0 of 4 cycles palonosetron (ALOXI) injection 0.25 mg, 0.25 mg, Intravenous,  Once, 0 of 4 cycles bleomycin (BLEOCIN) 24 Units in sodium chloride 0.9 % 50 mL chemo infusion, 10 Units/m2 = 24 Units, Intravenous,  Once, 0 of 4 cycles dacarbazine (DTIC) 910 mg in sodium chloride 0.9 % 250 mL chemo infusion, 375 mg/m2 = 910 mg, Intravenous,  Once, 0 of 4 cycles vinBLAStine (VELBAN) 14.6 mg in sodium chloride 0.9 % 50 mL chemo infusion, 6 mg/m2 = 14.6 mg, Intravenous, Once, 0 of 4 cycles fosaprepitant (EMEND) 150 mg, dexamethasone (DECADRON) 12 mg in sodium chloride 0.9 % 145 mL IVPB, , Intravenous,  Once, 0 of 4 cycles  for chemotherapy treatment.    11/21/2017 Imaging   PET: IMPRESSION: 1. Multiple relatively small hypermetabolic lymph nodes in the RIGHT neck, mediastinum and limited involvement of the upper abdomen. Lymph node activity is high (greater than liver, Deauville 4). 2. The most intense enlarged lymph node is anterior to the sternocleidomastoid muscle on the RIGHT. There are multiple assessable lymph nodes in the RIGHT cervical chain. 3. Minimal hypermetabolic adenopathy  in the upper abdomen. No pelvic or inguinal metastatic adenopathy. 4. Normal spleen and bone marrow. 5. Single focus of metabolic activity in the sigmoid colon. Recommend screening colonoscopy if patient is not current for such.   01/10/2018 Imaging   PET (after 2 cycles of ABVD) IMPRESSION: 1. Interval complete response to therapy. No abnormal areas of persistent hypermetabolic adenopathy identified. 2.  Aortic Atherosclerosis (ICD10-I70.0). 3. Multi vessel coronary artery atherosclerotic calcifications.   05/16/2018 Imaging    Post-treatment PET: IMPRESSION: 1. Small right cervical lymph nodes have only very low-grade activity, about half that of blood pool, and are small in size. Technically these are Deauville 2 as the lymph nodes have not completely resolved. 2. New focal hyperintensity in the sigmoid colon, maximum SUV 32.8 (Deauville 5). This was not present on 01/10/2018 and so presumably would represent physiologic activity, but the focal nature is unusual and a polyp is difficult to totally exclude. Correlate with colon screening history in determining whether further colon workup is warranted. 3. Prominent activity (Deauville 5) in the prostate gland, especially the peripheral zone, which is new and is associated with new periprostatic stranding. Prostatitis is favored given this constellation of findings.     REVIEW OF SYSTEMS:   Constitutional: ( - ) fevers, ( - )  chills , ( - ) night sweats Eyes: ( - ) blurriness of vision, ( - ) double vision, ( - ) watery eyes Ears, nose, mouth, throat, and face: ( - ) mucositis, ( - ) sore throat Respiratory: ( - ) cough, ( - ) dyspnea, ( - ) wheezes Cardiovascular: ( - ) palpitation, ( - ) chest discomfort, ( - ) lower extremity swelling Gastrointestinal:  ( - ) nausea, ( - ) heartburn, ( - ) change in bowel habits Skin: ( - ) abnormal skin rashes Lymphatics: ( - ) new lymphadenopathy, ( - ) easy bruising Neurological: ( - ) numbness, ( - ) tingling, ( - ) new weaknesses Behavioral/Psych: ( - ) mood change, ( - ) new changes  All other systems were reviewed with the patient and are negative.  I have reviewed the past medical history, past surgical history, social history and family history with the patient and they are unchanged from previous note.  ALLERGIES:  has No Known Allergies.  MEDICATIONS:  Current Outpatient Medications  Medication Sig Dispense Refill  . aspirin EC 81 MG tablet Take 1 tablet (81 mg total) by mouth daily. 90 tablet 3  .  atorvastatin (LIPITOR) 20 MG tablet TAKE 1 TABLET BY MOUTH DAILY 90 tablet 2  . carvedilol (COREG) 25 MG tablet Take 1 tablet (25 mg total) by mouth 2 (two) times daily. 60 tablet 6  . Cholecalciferol (VITAMIN D PO) Take 1 capsule by mouth at bedtime.    Marland Kitchen esomeprazole (NEXIUM) 20 MG capsule Take 20 mg by mouth 2 (two) times daily.    Marland Kitchen losartan (COZAAR) 50 MG tablet Take 1 tablet (50 mg total) by mouth daily. 30 tablet 6  . nitroGLYCERIN (NITROSTAT) 0.3 MG SL tablet Place 1 tablet (0.3 mg total) under the tongue every 5 (five) minutes as needed for chest pain. 30 tablet 0  . potassium chloride SA (K-DUR) 20 MEQ tablet Take 1 tablet (20 mEq total) by mouth daily. 90 tablet 1  . vitamin B-12 (CYANOCOBALAMIN) 100 MCG tablet Take 100 mcg by mouth daily.     No current facility-administered medications for this visit.     PHYSICAL EXAMINATION: ECOG PERFORMANCE STATUS:  1 - Symptomatic but completely ambulatory  Today's Vitals   09/25/18 1114  BP: (!) 159/78  Pulse: 65  Resp: 19  Temp: 97.9 F (36.6 C)  TempSrc: Temporal  SpO2: 100%  Weight: 271 lb 6.4 oz (123.1 kg)  Height: 5\' 11"  (1.803 m)  PainSc: 0-No pain   Body mass index is 37.85 kg/m.  Filed Weights   09/25/18 1114  Weight: 271 lb 6.4 oz (123.1 kg)    GENERAL: alert, no distress and comfortable SKIN: skin color, texture, turgor are normal, no rashes or significant lesions EYES: conjunctiva are pink and non-injected, sclera clear OROPHARYNX: no exudate, no erythema; lips, buccal mucosa, and tongue normal  NECK: supple, non-tender LYMPH:  no palpable lymphadenopathy in the cervical or axillary bilaterally  LUNGS: clear to auscultation with normal breathing effort HEART: regular rate & rhythm and no murmurs and no lower extremity edema ABDOMEN: soft, non-tender, non-distended, normal bowel sounds Musculoskeletal: no cyanosis of digits and no clubbing  PSYCH: alert & oriented x 3, fluent speech NEURO: no focal  motor/sensory deficits  LABORATORY DATA:  I have reviewed the data as listed    Component Value Date/Time   NA 138 09/25/2018 0834   NA 141 08/29/2017 1155   K 4.1 09/25/2018 0834   CL 105 09/25/2018 0834   CO2 24 09/25/2018 0834   GLUCOSE 116 (H) 09/25/2018 0834   BUN 18 09/25/2018 0834   BUN 16 08/29/2017 1155   CREATININE 0.90 09/25/2018 0834   CREATININE 0.83 09/27/2014 0001   CALCIUM 8.9 09/25/2018 0834   PROT 6.6 09/25/2018 0834   PROT 7.4 08/29/2017 1155   ALBUMIN 4.1 09/25/2018 0834   ALBUMIN 4.1 08/29/2017 1155   AST 19 09/25/2018 0834   ALT 18 09/25/2018 0834   ALKPHOS 67 09/25/2018 0834   BILITOT 0.7 09/25/2018 0834   GFRNONAA >60 09/25/2018 0834   GFRAA >60 09/25/2018 0834    No results found for: SPEP, UPEP  Lab Results  Component Value Date   WBC 8.7 09/25/2018   NEUTROABS 7.2 09/25/2018   HGB 13.4 09/25/2018   HCT 40.2 09/25/2018   MCV 86.5 09/25/2018   PLT 196 09/25/2018      Chemistry      Component Value Date/Time   NA 138 09/25/2018 0834   NA 141 08/29/2017 1155   K 4.1 09/25/2018 0834   CL 105 09/25/2018 0834   CO2 24 09/25/2018 0834   BUN 18 09/25/2018 0834   BUN 16 08/29/2017 1155   CREATININE 0.90 09/25/2018 0834   CREATININE 0.83 09/27/2014 0001      Component Value Date/Time   CALCIUM 8.9 09/25/2018 0834   ALKPHOS 67 09/25/2018 0834   AST 19 09/25/2018 0834   ALT 18 09/25/2018 0834   BILITOT 0.7 09/25/2018 0834       RADIOGRAPHIC STUDIES: I have personally reviewed the radiological images as listed below and agreed with the findings in the report. Ct Chest W Contrast  Result Date: 09/25/2018 CLINICAL DATA:  Hodgkin lymphoma.  Status post therapy. EXAM: CT CHEST, ABDOMEN, AND PELVIS WITH CONTRAST TECHNIQUE: Multidetector CT imaging of the chest, abdomen and pelvis was performed following the standard protocol during bolus administration of intravenous contrast. CONTRAST:  128mL OMNIPAQUE IOHEXOL 300 MG/ML  SOLN COMPARISON:   01/10/2018 most recent CTs of 10/07/2017. FINDINGS: CT CHEST FINDINGS Cardiovascular: Aortic atherosclerosis. Normal heart size, without pericardial effusion. Multivessel coronary artery atherosclerosis. No central pulmonary embolism, on this non-dedicated study. Mediastinum/Nodes: Multiple small left axillary  nodes. None are pathologic by size criteria. The largest measures 9 mm on 09/02 and is felt to have enlarged compared to the prior PET. No mediastinal or hilar adenopathy. Lungs/Pleura: No pleural fluid.  Clear lungs. Musculoskeletal: No acute osseous abnormality. Sixth right rib sclerotic lesion is likely a bone island and is similar. CT ABDOMEN PELVIS FINDINGS Hepatobiliary: Normal liver. Normal gallbladder, without biliary ductal dilatation. Pancreas: Normal, without mass or ductal dilatation. Spleen: Normal in size, without focal abnormality. Adrenals/Urinary Tract: Normal adrenal glands. Normal kidneys, without hydronephrosis. Normal urinary bladder. Stomach/Bowel: Normal stomach, without wall thickening. Normal colon and terminal ileum. Normal small bowel. Vascular/Lymphatic: Aortic and branch vessel atherosclerosis. No abdominopelvic adenopathy. Reproductive: Mild prostatomegaly. Other: No significant free fluid. Tiny bilateral fat containing inguinal hernias. Musculoskeletal: No acute osseous abnormality. IMPRESSION: 1. No evidence of adenopathy to suggest recurrent lymphoma. 2. Left axillary nodes are not pathologic by size criteria. These may have enlarged minimally compared to the prior PET. Recommend attention on follow-up. 3. Coronary artery atherosclerosis. Aortic Atherosclerosis (ICD10-I70.0). Electronically Signed   By: Abigail Miyamoto M.D.   On: 09/25/2018 11:03   Ct Abdomen Pelvis W Contrast  Result Date: 09/25/2018 CLINICAL DATA:  Hodgkin lymphoma.  Status post therapy. EXAM: CT CHEST, ABDOMEN, AND PELVIS WITH CONTRAST TECHNIQUE: Multidetector CT imaging of the chest, abdomen and pelvis was  performed following the standard protocol during bolus administration of intravenous contrast. CONTRAST:  132mL OMNIPAQUE IOHEXOL 300 MG/ML  SOLN COMPARISON:  01/10/2018 most recent CTs of 10/07/2017. FINDINGS: CT CHEST FINDINGS Cardiovascular: Aortic atherosclerosis. Normal heart size, without pericardial effusion. Multivessel coronary artery atherosclerosis. No central pulmonary embolism, on this non-dedicated study. Mediastinum/Nodes: Multiple small left axillary nodes. None are pathologic by size criteria. The largest measures 9 mm on 09/02 and is felt to have enlarged compared to the prior PET. No mediastinal or hilar adenopathy. Lungs/Pleura: No pleural fluid.  Clear lungs. Musculoskeletal: No acute osseous abnormality. Sixth right rib sclerotic lesion is likely a bone island and is similar. CT ABDOMEN PELVIS FINDINGS Hepatobiliary: Normal liver. Normal gallbladder, without biliary ductal dilatation. Pancreas: Normal, without mass or ductal dilatation. Spleen: Normal in size, without focal abnormality. Adrenals/Urinary Tract: Normal adrenal glands. Normal kidneys, without hydronephrosis. Normal urinary bladder. Stomach/Bowel: Normal stomach, without wall thickening. Normal colon and terminal ileum. Normal small bowel. Vascular/Lymphatic: Aortic and branch vessel atherosclerosis. No abdominopelvic adenopathy. Reproductive: Mild prostatomegaly. Other: No significant free fluid. Tiny bilateral fat containing inguinal hernias. Musculoskeletal: No acute osseous abnormality. IMPRESSION: 1. No evidence of adenopathy to suggest recurrent lymphoma. 2. Left axillary nodes are not pathologic by size criteria. These may have enlarged minimally compared to the prior PET. Recommend attention on follow-up. 3. Coronary artery atherosclerosis. Aortic Atherosclerosis (ICD10-I70.0). Electronically Signed   By: Abigail Miyamoto M.D.   On: 09/25/2018 11:03

## 2018-09-25 NOTE — Telephone Encounter (Signed)
Called and advised patient of next appointment and it was also on My Chart as well. Per 9/10 los

## 2018-09-25 NOTE — Telephone Encounter (Signed)
-----   Message from [name], MD sent at 09/25/2018  1:38 PM EDT ----- Can you let Mr. Villari know that his TSH is normal?Thanks.  [place] ----- Message ----- From: Buel Ream, Lab In [place] Sent: 09/25/2018   8:47 AM EDT To: [name], MD

## 2018-09-26 ENCOUNTER — Telehealth: Payer: Self-pay | Admitting: *Deleted

## 2018-09-26 NOTE — Telephone Encounter (Signed)
As noted below by Dr. Maylon Peppers, I informed patient of his CT neck results. Also, there is a questionable dental infection and you should follow up with your dentist. He verbalized understanding.

## 2018-09-26 NOTE — Telephone Encounter (Signed)
-----   Message from [name], MD sent at 09/26/2018  8:48 AM EDT ----- Delrae Sawyers, Can you let Mr. Jeanphilippe know that his CT neck was fine, but there is still questionable dental infection that he should follow up with his dentist and make sure there is no infection?Thanks.  [place]  ----- Message ----- From: Interface, Rad Results In Sent: 09/25/2018   9:17 PM EDT To: [name], MD

## 2018-09-26 NOTE — Telephone Encounter (Signed)
-----   Message from [name], MD sent at 09/26/2018  8:48 AM EDT ----- Delrae Sawyers, Can you let Bradley Novak know that his CT neck was fine, but there is still questionable dental infection that he should follow up with his dentist and make sure there is no infection?Thanks.  [place]  ----- Message ----- From: Interface, Rad Results In Sent: 09/25/2018   9:17 PM EDT To: [name], MD

## 2018-09-30 ENCOUNTER — Ambulatory Visit: Payer: Medicare Other | Admitting: Hematology

## 2018-09-30 ENCOUNTER — Other Ambulatory Visit: Payer: Medicare Other

## 2018-10-17 ENCOUNTER — Other Ambulatory Visit: Payer: Self-pay | Admitting: *Deleted

## 2018-10-17 MED ORDER — CARVEDILOL 25 MG PO TABS: 25.0000 mg | ORAL_TABLET | Freq: Two times a day (BID) | ORAL | 3 refills | Status: DC

## 2018-10-17 MED ORDER — LOSARTAN POTASSIUM 50 MG PO TABS: 50.0000 mg | ORAL_TABLET | Freq: Every day | ORAL | 3 refills | Status: DC

## 2018-10-22 DIAGNOSIS — Z23 Encounter for immunization: Secondary | ICD-10-CM | POA: Diagnosis not present

## 2018-11-07 ENCOUNTER — Telehealth: Payer: Self-pay | Admitting: Gastroenterology

## 2018-11-07 NOTE — Telephone Encounter (Signed)
Please review previous message and advise 

## 2018-11-07 NOTE — Telephone Encounter (Signed)
Pt is looking to schedule a colonoscopy but he finished cancer treatment in April. Pls advise if pt needs OV or can be direct book?

## 2018-11-11 NOTE — Telephone Encounter (Signed)
Please advise if the patient needs to see you again prior to being scheduled for his colonoscopy?

## 2018-11-11 NOTE — Telephone Encounter (Signed)
Pt called again about scheduling a colon.

## 2018-11-11 NOTE — Telephone Encounter (Signed)
Message has been sent to Dr. Lyndel Safe for response-please advise

## 2018-11-11 NOTE — Telephone Encounter (Signed)
Should be fine for colonoscopy  Must get it okay-ed from Dr. Maylon Peppers (oncology)  Thx  RG

## 2018-11-12 NOTE — Telephone Encounter (Signed)
Dr. Maylon Peppers,  will you please review this patient's chart and advise if he is cleared for Dr. Lyndel Safe to perform a colonoscopy. The patient is requesting to be scheduled, however, Dr. Lyndel Safe would like your "okay" prior to the patient being scheduled Thank you Lesly Rubenstein, BSN/RN

## 2018-11-13 NOTE — Telephone Encounter (Signed)
Okay to perform colonoscopy from Hodgkin lymphoma standpoint. Patient is in complete remission and has completed all of his chemotherapy.  Thanks.  Dr. Maylon Peppers

## 2018-11-13 NOTE — Telephone Encounter (Signed)
Called and spoke with patient-patient informed of clearance from Dr. Maylon Peppers and of Dr. Lyndel Safe recommending to proceed with colonoscopy-patient was informed he would be COVID screened-patient reports he wants to "hold off until all of this [place] down and they come up with a vaccine"-patient reported he would call the office back in a month or two to schedule his procedure-Patient advised to call back to the office at [phone] should questions/concerns arise;  Patient verbalized understanding of information/instructions;

## 2018-11-16 NOTE — Telephone Encounter (Signed)
Please proceed with colonoscopy with MiraLAX preparation   Thx  RG

## 2018-11-17 NOTE — Telephone Encounter (Signed)
Please see previous message/documentation concerning this patient-patient has requested to hold off on procedure at this time; patient has been advised to call back to the office to schedule procedure;

## 2018-12-23 DIAGNOSIS — M170 Bilateral primary osteoarthritis of knee: Secondary | ICD-10-CM | POA: Diagnosis not present

## 2018-12-23 DIAGNOSIS — M1712 Unilateral primary osteoarthritis, left knee: Secondary | ICD-10-CM | POA: Diagnosis not present

## 2018-12-23 DIAGNOSIS — M1711 Unilateral primary osteoarthritis, right knee: Secondary | ICD-10-CM | POA: Diagnosis not present

## 2018-12-25 ENCOUNTER — Other Ambulatory Visit: Payer: Medicare Other

## 2018-12-25 ENCOUNTER — Ambulatory Visit: Payer: Medicare Other | Admitting: Hematology

## 2019-01-19 ENCOUNTER — Encounter: Payer: Self-pay | Admitting: Hematology

## 2019-01-19 ENCOUNTER — Telehealth: Payer: Self-pay | Admitting: Hematology

## 2019-01-19 NOTE — Telephone Encounter (Signed)
I called and spoke with patient regarding appointments added per 1/4 pat advise message.

## 2019-01-20 ENCOUNTER — Other Ambulatory Visit: Payer: Self-pay

## 2019-01-20 ENCOUNTER — Telehealth: Payer: Self-pay | Admitting: Hematology

## 2019-01-20 ENCOUNTER — Inpatient Hospital Stay (HOSPITAL_BASED_OUTPATIENT_CLINIC_OR_DEPARTMENT_OTHER): Payer: Medicare Other | Admitting: Hematology

## 2019-01-20 ENCOUNTER — Inpatient Hospital Stay: Payer: Medicare Other | Attending: Hematology

## 2019-01-20 ENCOUNTER — Telehealth: Payer: Self-pay | Admitting: *Deleted

## 2019-01-20 ENCOUNTER — Encounter: Payer: Self-pay | Admitting: Hematology

## 2019-01-20 VITALS — BP 163/86 | HR 68 | Temp 97.3°F | Resp 20 | Ht 71.0 in | Wt 272.4 lb

## 2019-01-20 DIAGNOSIS — I700 Atherosclerosis of aorta: Secondary | ICD-10-CM | POA: Insufficient documentation

## 2019-01-20 DIAGNOSIS — R933 Abnormal findings on diagnostic imaging of other parts of digestive tract: Secondary | ICD-10-CM | POA: Diagnosis not present

## 2019-01-20 DIAGNOSIS — Z7982 Long term (current) use of aspirin: Secondary | ICD-10-CM | POA: Insufficient documentation

## 2019-01-20 DIAGNOSIS — Z8572 Personal history of non-Hodgkin lymphomas: Secondary | ICD-10-CM | POA: Diagnosis not present

## 2019-01-20 DIAGNOSIS — C8190 Hodgkin lymphoma, unspecified, unspecified site: Secondary | ICD-10-CM

## 2019-01-20 DIAGNOSIS — D72829 Elevated white blood cell count, unspecified: Secondary | ICD-10-CM | POA: Insufficient documentation

## 2019-01-20 DIAGNOSIS — R944 Abnormal results of kidney function studies: Secondary | ICD-10-CM | POA: Diagnosis not present

## 2019-01-20 DIAGNOSIS — R948 Abnormal results of function studies of other organs and systems: Secondary | ICD-10-CM

## 2019-01-20 DIAGNOSIS — C8110 Nodular sclerosis classical Hodgkin lymphoma, unspecified site: Secondary | ICD-10-CM

## 2019-01-20 DIAGNOSIS — Z719 Counseling, unspecified: Secondary | ICD-10-CM | POA: Diagnosis not present

## 2019-01-20 DIAGNOSIS — Z79899 Other long term (current) drug therapy: Secondary | ICD-10-CM | POA: Diagnosis not present

## 2019-01-20 DIAGNOSIS — Z9221 Personal history of antineoplastic chemotherapy: Secondary | ICD-10-CM | POA: Diagnosis not present

## 2019-01-20 LAB — CBC WITH DIFFERENTIAL (CANCER CENTER ONLY)
Abs Immature Granulocytes: 0.03 10*3/uL (ref 0.00–0.07)
Basophils Absolute: 0.1 10*3/uL (ref 0.0–0.1)
Basophils Relative: 1 %
Eosinophils Absolute: 0.2 10*3/uL (ref 0.0–0.5)
Eosinophils Relative: 2 %
HCT: 41.8 % (ref 39.0–52.0)
Hemoglobin: 13.7 g/dL (ref 13.0–17.0)
Immature Granulocytes: 0 %
Lymphocytes Relative: 18 %
Lymphs Abs: 1.9 10*3/uL (ref 0.7–4.0)
MCH: 29.7 pg (ref 26.0–34.0)
MCHC: 32.8 g/dL (ref 30.0–36.0)
MCV: 90.7 fL (ref 80.0–100.0)
Monocytes Absolute: 0.7 10*3/uL (ref 0.1–1.0)
Monocytes Relative: 6 %
Neutro Abs: 8 10*3/uL — ABNORMAL HIGH (ref 1.7–7.7)
Neutrophils Relative %: 73 %
Platelet Count: 227 10*3/uL (ref 150–400)
RBC: 4.61 MIL/uL (ref 4.22–5.81)
RDW: 13.4 % (ref 11.5–15.5)
WBC Count: 10.9 10*3/uL — ABNORMAL HIGH (ref 4.0–10.5)
nRBC: 0 % (ref 0.0–0.2)

## 2019-01-20 LAB — CMP (CANCER CENTER ONLY)
ALT: 18 U/L (ref 0–44)
AST: 14 U/L — ABNORMAL LOW (ref 15–41)
Albumin: 4.2 g/dL (ref 3.5–5.0)
Alkaline Phosphatase: 59 U/L (ref 38–126)
Anion gap: 6 (ref 5–15)
BUN: 29 mg/dL — ABNORMAL HIGH (ref 8–23)
CO2: 30 mmol/L (ref 22–32)
Calcium: 9.7 mg/dL (ref 8.9–10.3)
Chloride: 106 mmol/L (ref 98–111)
Creatinine: 1.3 mg/dL — ABNORMAL HIGH (ref 0.61–1.24)
GFR, Est AFR Am: >60 mL/min (ref >60)
GFR, Estimated: 56 mL/min — ABNORMAL LOW (ref >60)
Glucose, Bld: 104 mg/dL — ABNORMAL HIGH (ref 70–99)
Potassium: 4.6 mmol/L (ref 3.5–5.1)
Sodium: 142 mmol/L (ref 135–145)
Total Bilirubin: 0.6 mg/dL (ref 0.3–1.2)
Total Protein: 7 g/dL (ref 6.5–8.1)

## 2019-01-20 NOTE — Progress Notes (Signed)
[place] OFFICE PROGRESS NOTE  Patient Care Team: Tysinger, Camelia Eng, PA-C as PCP - General (Family Medicine) Nahser, Wonda Cheng, MD as PCP - Cardiology (Cardiology) Constance Haw, MD as PCP - Electrophysiology (Cardiology)  HEME/ONC OVERVIEW: 1. Stage III classic Hodgkin lymphoma, subtype NOS due to limited specimen; currently NED  -Bx-proven R cervical LN involvement in 09/2017; PET in 11/2017 showed cervical, mediastinal and upper abdominal LN involement; treatment delayed due to pt traveling on a cruise after diagnosis -11/2017 - 01/2018: ABVD x 2 cycles  12/2017: PET after 2 cycles of ABVD showed interval CR  -Chemotherapy delayed due to SVT requiring ablation in 01/2018 -01/2018 - 03/2018: AVD x two and a half cycles; patient declined further treatment due to poor tolerance  -05/2018: end-of treatment PET showed CR (0.8cm R cervical LN, Deauville 2; no marrow lesion) -On surveillance   2. Port placement in 11/2017   CHEMOTHERAPY REGIMEN:  11/26/2017 - 01/07/2018: ABVD x 2 cycles, CR  02/04/2018 - 04/01/2018: AVD x two and a half cycles; patient declined further treatment due to poor tolerance   On surveillance   ASSESSMENT & PLAN:   Stage III classic Hodgkin lymphoma, subtype NOS; favorable risk  -Clinically, patient is doing well without any constitutional symptoms; no evidence of lymphadenopathy on exam -I have ordered CT neck, chest, abdomen/pelvis in 03/2019 (53-month surveillance scan) -I counseled the patient on any concerning symptoms, including persistent fever, night sweats, unexplained weight loss, or enlarging LN's, for which he is instructed to contact us anytime -Based on CR, follow-up will be as below:  Labs and exam q3-20months x 1-2 years, then q6-35months until Year 3, and then annually  CT neck, chest, and AP at 6, 12 and 24 months following completion of therapy   Annual influenza vaccine   Leukocytosis -WBC 10.9k today, slightly  elevated -Clinically, patient denies any symptoms of infection -He has a history of dental abscess, but has not had any significant symptoms; he was referred to [facility] by his dentist, but he has not yet set up an appointment -I encouraged patient to follow-up with [facility], as recommended by his dentist  Elevated creatinine -Cr 1.3 today, slightly elevated; new -Possibly due to dehydration vs BP medications -I encouraged the patient to maintain adequate hydration, and follow up with PCP for further management   Abnormal sigmoid colon uptake -Noted incidentally on PET in 05/2018 -No GI symptoms -Colonoscopy not yet scheduled due to elective procedures on hold in the pandemic -I encouraged the patient to schedule his colonoscopy as able; continue follow-up with GI   Health counseling -We discussed at length regarding some of the benefits and potential risks of COVID vaccine -As the patient has increased risk of developing complications from Covid, I encouraged patient to get his Covid vaccine when able -Patient expressed understanding, and agreed with the plan  Orders Placed This Encounter  Procedures  . CT Soft Tissue Neck W Contrast    Standing Status:   Future    Standing Expiration Date:   01/20/2020    Order Specific Question:   If indicated for the ordered procedure, I authorize the administration of contrast media per Radiology protocol    Answer:   Yes    Order Specific Question:   Preferred imaging location?    Answer:   [facility] Specific Question:   Radiology Contrast Protocol - do NOT remove file path    Answer:   \\[email or web address]  .  CT chest w/ contrast    Standing Status:   Future    Standing Expiration Date:   01/20/2020    Order Specific Question:   If indicated for the ordered procedure, I authorize the administration of contrast media per Radiology protocol    Answer:   Yes    Order Specific Question:   Preferred imaging  location?    Answer:   [facility] Specific Question:   Radiology Contrast Protocol - do NOT remove file path    Answer:   \\[email or web address]  . CT AP w/ contrast    Standing Status:   Future    Standing Expiration Date:   01/20/2020    Order Specific Question:   If indicated for the ordered procedure, I authorize the administration of contrast media per Radiology protocol    Answer:   Yes    Order Specific Question:   Preferred imaging location?    Answer:   [facility] Specific Question:   Is Oral Contrast requested for this exam?    Answer:   Yes, Per Radiology protocol    Order Specific Question:   Radiology Contrast Protocol - do NOT remove file path    Answer:   \\[email or web address]   All questions were answered. The patient knows to call the clinic with any problems, questions or concerns. No barriers to learning was detected.  Return in 2 months for labs, imaging results and clinic appt.   [name], MD 01/20/2019 2:41 PM  CHIEF COMPLAINT: "I am still easily tired"  INTERVAL HISTORY: Bradley Novak returns clinic for follow-up of Hodgkin lymphoma on surveillance.  Patient reports that his energy level has continued to improve since completing his treatment, but he still is easily tired out, especially after walking in the grocery store or do some chores at home.  He checks his lymph nodes in his neck daily, and has not noticed any abnormal findings.  He denies any constitutional symptoms.  He was referred to [facility] by his dentist due to questionable dental abscess, but he has not yet set up a follow-up appointment.  He was also referred to gastroenterology for colonoscopy after PET showed an incidental sigmoid colon uptake, but colonoscopy has been on hold due to the pandemic.  He denies any other complaint today.  REVIEW OF SYSTEMS:   Constitutional: ( - ) fevers, ( - )  chills , ( - ) night sweats Eyes:  ( - ) blurriness of vision, ( - ) double vision, ( - ) watery eyes Ears, nose, mouth, throat, and face: ( - ) mucositis, ( - ) sore throat Respiratory: ( - ) cough, ( - ) dyspnea, ( - ) wheezes Cardiovascular: ( - ) palpitation, ( - ) chest discomfort, ( - ) lower extremity swelling Gastrointestinal:  ( - ) nausea, ( - ) heartburn, ( - ) change in bowel habits Skin: ( - ) abnormal skin rashes Lymphatics: ( - ) new lymphadenopathy, ( - ) easy bruising Neurological: ( - ) numbness, ( - ) tingling, ( - ) new weaknesses Behavioral/Psych: ( - ) mood change, ( - ) new changes  All other systems were reviewed with the patient and are negative.  SUMMARY OF ONCOLOGIC HISTORY: Oncology History  Hodgkin lymphoma ([place])  08/29/2017 Initial Diagnosis   Hodgkin lymphoma ([place])   10/07/2017 Imaging   CT neck w/ contrast: Enlarged nodes in the right jugular chain, posterior triangle, and supraclavicular  fossa. The pattern is most concerning for lymphoma; multiple nodes are superficial and amenable to biopsy.  CT CAP w/ contrast: 1. Right supraclavicular and subcarinal adenopathy is identified. Differential considerations include lymphoproliferative disorder, metastatic adenopathy, granulomatous inflammation/infection or reactive adenopathy. Consider further evaluation with tissue sampling. 2. No acute findings, mass or adenopathy within the abdomen. 3. Lungs are clear.  No evidence for pneumonia.  4.  Aortic Atherosclerosis (ICD10-I70.0).   10/25/2017 Procedure   US-guided R cervical LN biopsy   10/25/2017 Pathology Results   (Accession: [id number]) Lymph node, needle/core biopsy, Right Cervical - CLASSIC HODGKIN LYMPHOMA - SEE COMMENT - CD30 (1%) - Unable to subtype due to limited sample specimen   11/12/2017 Cancer Staging   Staging form: Hodgkin and Non-Hodgkin Lymphoma, AJCC 8th Edition - Clinical stage from 11/12/2017: Stage II (Hodgkin lymphoma, A - Asymptomatic) - Signed by [name], MD  on 11/12/2017   11/21/2017 -  Chemotherapy   The patient had DOXOrubicin (ADRIAMYCIN) chemo injection 60 mg, 25 mg/m2 = 60 mg, Intravenous,  Once, 0 of 4 cycles palonosetron (ALOXI) injection 0.25 mg, 0.25 mg, Intravenous,  Once, 0 of 4 cycles bleomycin (BLEOCIN) 24 Units in sodium chloride 0.9 % 50 mL chemo infusion, 10 Units/m2 = 24 Units, Intravenous,  Once, 0 of 4 cycles dacarbazine (DTIC) 910 mg in sodium chloride 0.9 % 250 mL chemo infusion, 375 mg/m2 = 910 mg, Intravenous,  Once, 0 of 4 cycles vinBLAStine (VELBAN) 14.6 mg in sodium chloride 0.9 % 50 mL chemo infusion, 6 mg/m2 = 14.6 mg, Intravenous, Once, 0 of 4 cycles fosaprepitant (EMEND) 150 mg, dexamethasone (DECADRON) 12 mg in sodium chloride 0.9 % 145 mL IVPB, , Intravenous,  Once, 0 of 4 cycles  for chemotherapy treatment.    11/21/2017 Imaging   PET: IMPRESSION: 1. Multiple relatively small hypermetabolic lymph nodes in the RIGHT neck, mediastinum and limited involvement of the upper abdomen. Lymph node activity is high (greater than liver, Deauville 4). 2. The most intense enlarged lymph node is anterior to the sternocleidomastoid muscle on the RIGHT. There are multiple assessable lymph nodes in the RIGHT cervical chain. 3. Minimal hypermetabolic adenopathy in the upper abdomen. No pelvic or inguinal metastatic adenopathy. 4. Normal spleen and bone marrow. 5. Single focus of metabolic activity in the sigmoid colon. Recommend screening colonoscopy if patient is not current for such.   01/10/2018 Imaging   PET (after 2 cycles of ABVD) IMPRESSION: 1. Interval complete response to therapy. No abnormal areas of persistent hypermetabolic adenopathy identified. 2.  Aortic Atherosclerosis (ICD10-I70.0). 3. Multi vessel coronary artery atherosclerotic calcifications.   05/16/2018 Imaging   Post-treatment PET: IMPRESSION: 1. Small right cervical lymph nodes have only very low-grade activity, about half that of blood pool, and are  small in size. Technically these are Deauville 2 as the lymph nodes have not completely resolved. 2. New focal hyperintensity in the sigmoid colon, maximum SUV 32.8 (Deauville 5). This was not present on 01/10/2018 and so presumably would represent physiologic activity, but the focal nature is unusual and a polyp is difficult to totally exclude. Correlate with colon screening history in determining whether further colon workup is warranted. 3. Prominent activity (Deauville 5) in the prostate gland, especially the peripheral zone, which is new and is associated with new periprostatic stranding. Prostatitis is favored given this constellation of findings.     I have reviewed the past medical history, past surgical history, social history and family history with the patient and they are unchanged from previous  note.  ALLERGIES:  has No Known Allergies.  MEDICATIONS:  Current Outpatient Medications  Medication Sig Dispense Refill  . aspirin EC 81 MG tablet Take 1 tablet (81 mg total) by mouth daily. 90 tablet 3  . atorvastatin (LIPITOR) 20 MG tablet TAKE 1 TABLET BY MOUTH DAILY 90 tablet 2  . carvedilol (COREG) 25 MG tablet Take 1 tablet (25 mg total) by mouth 2 (two) times daily. 180 tablet 3  . Cholecalciferol (VITAMIN D PO) Take 1 capsule by mouth at bedtime.    Marland Kitchen esomeprazole (NEXIUM) 20 MG capsule Take 20 mg by mouth 2 (two) times daily.    Marland Kitchen losartan (COZAAR) 50 MG tablet Take 1 tablet (50 mg total) by mouth daily. 90 tablet 3  . nitroGLYCERIN (NITROSTAT) 0.3 MG SL tablet Place 1 tablet (0.3 mg total) under the tongue every 5 (five) minutes as needed for chest pain. 30 tablet 0  . potassium chloride SA (K-DUR) 20 MEQ tablet Take 1 tablet (20 mEq total) by mouth daily. 90 tablet 1  . vitamin B-12 (CYANOCOBALAMIN) 100 MCG tablet Take 100 mcg by mouth daily.     No current facility-administered medications for this visit.    PHYSICAL EXAMINATION: ECOG PERFORMANCE STATUS: 1 -  Symptomatic but completely ambulatory  Today's Vitals   01/20/19 1426  BP: (!) 163/86  Pulse: 68  Resp: 20  Temp: (!) 97.3 F (36.3 C)  TempSrc: Temporal  SpO2: 98%  Weight: 272 lb 6.4 oz (123.6 kg)  Height: 5\' 11"  (1.803 m)  PainSc: 0-No pain   Body mass index is 37.99 kg/m.  Filed Weights   01/20/19 1426  Weight: 272 lb 6.4 oz (123.6 kg)    GENERAL: alert, no distress and comfortable SKIN: skin color, texture, turgor are normal, no rashes or significant lesions EYES: conjunctiva are pink and non-injected, sclera clear OROPHARYNX: no exudate, no erythema; lips, buccal mucosa, and tongue normal  NECK: supple, non-tender LYMPH:  no palpable lymphadenopathy in the cervical or axillary  LUNGS: clear to auscultation with normal breathing effort HEART: regular rate & rhythm and no murmurs and no lower extremity edema ABDOMEN: soft, non-tender, non-distended, normal bowel sounds Musculoskeletal: no cyanosis of digits and no clubbing  PSYCH: alert & oriented x 3, fluent speech  LABORATORY DATA:  I have reviewed the data as listed    Component Value Date/Time   NA 138 09/25/2018 0834   NA 141 08/29/2017 1155   K 4.1 09/25/2018 0834   CL 105 09/25/2018 0834   CO2 24 09/25/2018 0834   GLUCOSE 116 (H) 09/25/2018 0834   BUN 18 09/25/2018 0834   BUN 16 08/29/2017 1155   CREATININE 0.90 09/25/2018 0834   CREATININE 0.83 09/27/2014 0001   CALCIUM 8.9 09/25/2018 0834   PROT 6.6 09/25/2018 0834   PROT 7.4 08/29/2017 1155   ALBUMIN 4.1 09/25/2018 0834   ALBUMIN 4.1 08/29/2017 1155   AST 19 09/25/2018 0834   ALT 18 09/25/2018 0834   ALKPHOS 67 09/25/2018 0834   BILITOT 0.7 09/25/2018 0834   GFRNONAA >60 09/25/2018 0834   GFRAA >60 09/25/2018 0834    No results found for: SPEP, UPEP  Lab Results  Component Value Date   WBC 10.9 (H) 01/20/2019   NEUTROABS 8.0 (H) 01/20/2019   HGB 13.7 01/20/2019   HCT 41.8 01/20/2019   MCV 90.7 01/20/2019   PLT 227 01/20/2019       Chemistry      Component Value Date/Time   NA 138  09/25/2018 0834   NA 141 08/29/2017 1155   K 4.1 09/25/2018 0834   CL 105 09/25/2018 0834   CO2 24 09/25/2018 0834   BUN 18 09/25/2018 0834   BUN 16 08/29/2017 1155   CREATININE 0.90 09/25/2018 0834   CREATININE 0.83 09/27/2014 0001      Component Value Date/Time   CALCIUM 8.9 09/25/2018 0834   ALKPHOS 67 09/25/2018 0834   AST 19 09/25/2018 0834   ALT 18 09/25/2018 0834   BILITOT 0.7 09/25/2018 0834       RADIOGRAPHIC STUDIES: I have personally reviewed the radiological images as listed below and agreed with the findings in the report. No results found.

## 2019-01-20 NOTE — Telephone Encounter (Signed)
-----   Message from [name], MD sent at 01/20/2019  2:57 PM EST ----- Bradley Novak,  Can you let Mr. Khouri know that his creatinine is mildly elevated, possibly due to dehydration? He should drink plenty of water, and follow up with his PCP to have renal function monitored in ~2 weeks? He should also avoid NSAIDs.   Thanks.  [place]  ----- Message ----- From: Buel Ream, Lab In [place] Sent: 01/20/2019   2:26 PM EST To: [name], MD

## 2019-01-20 NOTE — Telephone Encounter (Signed)
Notified pt of MD instructions, pt verbalized understanding.

## 2019-01-20 NOTE — Telephone Encounter (Signed)
Appointments scheduled / contrast provided for CT Scan to be done in 03/2019/ per 1/5 los

## 2019-01-21 LAB — LACTATE DEHYDROGENASE: LDH: 155 U/L (ref 98–192)

## 2019-02-12 DIAGNOSIS — C44329 Squamous cell carcinoma of skin of other parts of face: Secondary | ICD-10-CM | POA: Diagnosis not present

## 2019-02-20 ENCOUNTER — Other Ambulatory Visit: Payer: Self-pay

## 2019-02-20 ENCOUNTER — Ambulatory Visit (INDEPENDENT_AMBULATORY_CARE_PROVIDER_SITE_OTHER): Payer: Medicare Other | Admitting: Medical

## 2019-02-20 ENCOUNTER — Encounter: Payer: Self-pay | Admitting: Medical

## 2019-02-20 VITALS — BP 130/82 | HR 64 | Temp 98.7°F | Ht 71.0 in | Wt 275.4 lb

## 2019-02-20 DIAGNOSIS — L989 Disorder of the skin and subcutaneous tissue, unspecified: Secondary | ICD-10-CM | POA: Insufficient documentation

## 2019-02-20 DIAGNOSIS — I10 Essential (primary) hypertension: Secondary | ICD-10-CM | POA: Diagnosis not present

## 2019-02-20 DIAGNOSIS — Z125 Encounter for screening for malignant neoplasm of prostate: Secondary | ICD-10-CM | POA: Diagnosis not present

## 2019-02-20 DIAGNOSIS — C8190 Hodgkin lymphoma, unspecified, unspecified site: Secondary | ICD-10-CM

## 2019-02-20 DIAGNOSIS — R7989 Other specified abnormal findings of blood chemistry: Secondary | ICD-10-CM | POA: Insufficient documentation

## 2019-02-20 DIAGNOSIS — R3589 Other polyuria: Secondary | ICD-10-CM | POA: Insufficient documentation

## 2019-02-20 DIAGNOSIS — R358 Other polyuria: Secondary | ICD-10-CM

## 2019-02-20 DIAGNOSIS — R5383 Other fatigue: Secondary | ICD-10-CM | POA: Diagnosis not present

## 2019-02-20 DIAGNOSIS — I25119 Atherosclerotic heart disease of native coronary artery with unspecified angina pectoris: Secondary | ICD-10-CM | POA: Diagnosis not present

## 2019-02-20 NOTE — Progress Notes (Signed)
ONCOLOgy, advised PCP f/u renal check  Lipid bmet  PSA   Subjective:  Bradley Novak is a 69 y.o. male who presents for Chief Complaint  Patient presents with  . Follow-up     Here today in follow-up.  His oncologist recently advised him to follow-up here in regards to abnormal creatinine.  He is nonfasting today.  Lately he has been doing relatively well, but does report some fatigue and gets out of breath or out of energy relatively easily.  He has discussed his fatigue with some of his specialists and they currently attribute his fatigue to having went through all the therapy for lymphoma and trying to gradually regain his strength..  He had discussed this with cardiology but he has not had a stress test although he has had other heart testing.  He denies any chest pain.  He notes that he is not yet back to exercising, does not feel the energy for this.  He is trying to eat healthy but he does note gaining weight through all the cancer therapy and Covid restrictions.  He has no new complaints. No other aggravating or relieving factors.    No other c/o.  The following portions of the patient's history were reviewed and updated as appropriate: allergies, current medications, past family history, past medical history, past social history, past surgical history and problem list.  Past Medical History:  Diagnosis Date  . GERD (gastroesophageal reflux disease)   . History of chemotherapy    7 months per patient  . History of hiatal hernia   . Hodgkin's lymphoma ([place]) 09/2017  . Hyperlipidemia   . Hypertension 2006  . Obesity   . Pneumonia 09/2017  . SVT (supraventricular tachycardia) ([place])   . Wears glasses    Current Outpatient Medications on File Prior to Visit  Medication Sig Dispense Refill  . aspirin EC 81 MG tablet Take 1 tablet (81 mg total) by mouth daily. 90 tablet 3  . atorvastatin (LIPITOR) 20 MG tablet TAKE 1 TABLET BY MOUTH DAILY 90 tablet 2  . carvedilol  (COREG) 25 MG tablet Take 1 tablet (25 mg total) by mouth 2 (two) times daily. 180 tablet 3  . Cholecalciferol (VITAMIN D PO) Take 1 capsule by mouth at bedtime.    Marland Kitchen esomeprazole (NEXIUM) 20 MG capsule Take 20 mg by mouth 2 (two) times daily.    Marland Kitchen losartan (COZAAR) 50 MG tablet Take 1 tablet (50 mg total) by mouth daily. 90 tablet 3  . vitamin B-12 (CYANOCOBALAMIN) 100 MCG tablet Take 100 mcg by mouth daily.    . nitroGLYCERIN (NITROSTAT) 0.3 MG SL tablet Place 1 tablet (0.3 mg total) under the tongue every 5 (five) minutes as needed for chest pain. 30 tablet 0  . potassium chloride SA (K-DUR) 20 MEQ tablet Take 1 tablet (20 mEq total) by mouth daily. 90 tablet 1   No current facility-administered medications on file prior to visit.     ROS Otherwise as in subjective above  Objective: BP 130/82   Pulse 64   Temp 98.7 F (37.1 C)   Ht 5\' 11"  (1.803 m)   Wt 275 lb 6.4 oz (124.9 kg)   SpO2 95%   BMI 38.41 kg/m   General appearance: alert, no distress, well developed, well nourished Skin: Right preauricular area with a 1.5 cm round mass slightly raised, pinkish with a central red scab from recent biopsy Oral cavity: MMM, no lesions Neck: supple, no lymphadenopathy, no thyromegaly, no masses  Heart: RRR, normal S1, S2, no murmurs Lungs: CTA bilaterally, no wheezes, rhonchi, or rales Abdomen: +bs, soft, non tender, non distended, no masses, no hepatomegaly, no splenomegaly Pulses: 2+ radial pulses, 2+ pedal pulses, normal cap refill Ext: no edema    Assessment: Encounter Diagnoses  Name Primary?  . Elevated serum creatinine Yes  . Polyuria   . Essential hypertension   . Hodgkin lymphoma, unspecified Hodgkin lymphoma type, unspecified body region ([place])   . Coronary artery disease with angina pectoris, unspecified vessel or lesion type, unspecified whether native or transplanted heart ([place])   . Skin lesion   . Fatigue, unspecified type      Plan: I reviewed his recent  oncology note, recent labs in the chart.  Recheck labs today regarding kidney function.  Discussed importance of hydration.  He notes some polyuria so we will check a few labs as below.  Is also past due on PSA testing.  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  This was just recently biopsied  We discussed his fatigue.  I recommend he talk to cardiology about possible stress test given CT findings of coronary artery disease.  He is not anemic and his recent thyroid lab was normal.  Continue current medication.  Brad was seen today for follow-up.  Diagnoses and all orders for this visit:  Elevated serum creatinine -     Renal Function Panel -     Lipid panel  Polyuria -     Renal Function Panel -     PSA  Essential hypertension -     Lipid panel  Hodgkin lymphoma, unspecified Hodgkin lymphoma type, unspecified body region [facility])  Coronary artery disease with angina pectoris, unspecified vessel or lesion type, unspecified whether native or transplanted heart ([facility]) -     Lipid panel  Skin lesion  Fatigue, unspecified type    Follow up: Pending labs

## 2019-02-21 LAB — RENAL FUNCTION PANEL
Albumin: 4.3 g/dL (ref 3.8–4.8)
BUN/Creatinine Ratio: 18 (ref 10–24)
BUN: 16 mg/dL (ref 8–27)
CO2: 23 mmol/L (ref 20–29)
Calcium: 9.5 mg/dL (ref 8.6–10.2)
Chloride: 104 mmol/L (ref 96–106)
Creatinine, Ser: 0.9 mg/dL (ref 0.76–1.27)
GFR calc Af Amer: 102 mL/min/{1.73_m2} (ref >59)
GFR calc non Af Amer: 88 mL/min/{1.73_m2} (ref >59)
Glucose: 96 mg/dL (ref 65–99)
Phosphorus: 3.3 mg/dL (ref 2.8–4.1)
Potassium: 4.1 mmol/L (ref 3.5–5.2)
Sodium: 140 mmol/L (ref 134–144)

## 2019-02-21 LAB — LIPID PANEL
Chol/HDL Ratio: 3.2 ratio (ref 0.0–5.0)
Cholesterol, Total: 146 mg/dL (ref 100–199)
HDL: 46 mg/dL (ref >39)
LDL Chol Calc (NIH): 64 mg/dL (ref 0–99)
Triglycerides: 221 mg/dL — ABNORMAL HIGH (ref 0–149)
VLDL Cholesterol Cal: 36 mg/dL (ref 5–40)

## 2019-02-21 LAB — PSA: Prostate Specific Ag, Serum: 2.9 ng/mL (ref 0.0–4.0)

## 2019-02-23 ENCOUNTER — Other Ambulatory Visit: Payer: Self-pay | Admitting: Medical

## 2019-02-23 DIAGNOSIS — E876 Hypokalemia: Secondary | ICD-10-CM

## 2019-02-23 MED ORDER — ATORVASTATIN CALCIUM 20 MG PO TABS: ORAL_TABLET | ORAL | 3 refills | Status: DC

## 2019-02-23 MED ORDER — ASPIRIN EC 81 MG PO TBEC: 81.0000 mg | DELAYED_RELEASE_TABLET | Freq: Every day | ORAL | 3 refills | Status: DC

## 2019-02-23 MED ORDER — POTASSIUM CHLORIDE CRYS ER 20 MEQ PO TBCR: 20.0000 meq | EXTENDED_RELEASE_TABLET | Freq: Every day | ORAL | 1 refills | Status: DC

## 2019-02-24 DIAGNOSIS — C44329 Squamous cell carcinoma of skin of other parts of face: Secondary | ICD-10-CM | POA: Diagnosis not present

## 2019-03-30 ENCOUNTER — Other Ambulatory Visit: Payer: Self-pay

## 2019-03-30 ENCOUNTER — Inpatient Hospital Stay: Payer: Medicare Other | Attending: Hematology

## 2019-03-30 ENCOUNTER — Ambulatory Visit (HOSPITAL_BASED_OUTPATIENT_CLINIC_OR_DEPARTMENT_OTHER)
Admission: RE | Admit: 2019-03-30 | Discharge: 2019-03-30 | Disposition: A | Discharge status: Home / Self Care | Payer: Medicare Other | Source: Ambulatory Visit | Attending: Hematology | Admitting: Hematology

## 2019-03-30 DIAGNOSIS — C8110 Nodular sclerosis classical Hodgkin lymphoma, unspecified site: Secondary | ICD-10-CM

## 2019-03-30 DIAGNOSIS — Z9221 Personal history of antineoplastic chemotherapy: Secondary | ICD-10-CM | POA: Diagnosis not present

## 2019-03-30 DIAGNOSIS — R0602 Shortness of breath: Secondary | ICD-10-CM | POA: Insufficient documentation

## 2019-03-30 DIAGNOSIS — R61 Generalized hyperhidrosis: Secondary | ICD-10-CM | POA: Diagnosis not present

## 2019-03-30 DIAGNOSIS — M4802 Spinal stenosis, cervical region: Secondary | ICD-10-CM | POA: Diagnosis not present

## 2019-03-30 DIAGNOSIS — Z7982 Long term (current) use of aspirin: Secondary | ICD-10-CM | POA: Diagnosis not present

## 2019-03-30 DIAGNOSIS — C8190 Hodgkin lymphoma, unspecified, unspecified site: Secondary | ICD-10-CM | POA: Diagnosis not present

## 2019-03-30 DIAGNOSIS — Z79899 Other long term (current) drug therapy: Secondary | ICD-10-CM | POA: Insufficient documentation

## 2019-03-30 DIAGNOSIS — I700 Atherosclerosis of aorta: Secondary | ICD-10-CM | POA: Insufficient documentation

## 2019-03-30 DIAGNOSIS — R634 Abnormal weight loss: Secondary | ICD-10-CM | POA: Insufficient documentation

## 2019-03-30 DIAGNOSIS — R5383 Other fatigue: Secondary | ICD-10-CM | POA: Insufficient documentation

## 2019-03-30 DIAGNOSIS — Z8572 Personal history of non-Hodgkin lymphomas: Secondary | ICD-10-CM | POA: Diagnosis not present

## 2019-03-30 DIAGNOSIS — R509 Fever, unspecified: Secondary | ICD-10-CM | POA: Insufficient documentation

## 2019-03-30 DIAGNOSIS — C969 Malignant neoplasm of lymphoid, hematopoietic and related tissue, unspecified: Secondary | ICD-10-CM | POA: Diagnosis not present

## 2019-03-30 LAB — CMP (CANCER CENTER ONLY)
ALT: 16 U/L (ref 0–44)
AST: 15 U/L (ref 15–41)
Albumin: 4.2 g/dL (ref 3.5–5.0)
Alkaline Phosphatase: 50 U/L (ref 38–126)
Anion gap: 7 (ref 5–15)
BUN: 23 mg/dL (ref 8–23)
CO2: 28 mmol/L (ref 22–32)
Calcium: 9.6 mg/dL (ref 8.9–10.3)
Chloride: 104 mmol/L (ref 98–111)
Creatinine: 1.06 mg/dL (ref 0.61–1.24)
GFR, Est AFR Am: >60 mL/min (ref >60)
GFR, Estimated: >60 mL/min (ref >60)
Glucose, Bld: 124 mg/dL — ABNORMAL HIGH (ref 70–99)
Potassium: 4.2 mmol/L (ref 3.5–5.1)
Sodium: 139 mmol/L (ref 135–145)
Total Bilirubin: 0.7 mg/dL (ref 0.3–1.2)
Total Protein: 6.7 g/dL (ref 6.5–8.1)

## 2019-03-30 LAB — CBC WITH DIFFERENTIAL (CANCER CENTER ONLY)
Abs Immature Granulocytes: 0.02 10*3/uL (ref 0.00–0.07)
Basophils Absolute: 0 10*3/uL (ref 0.0–0.1)
Basophils Relative: 1 %
Eosinophils Absolute: 0.1 10*3/uL (ref 0.0–0.5)
Eosinophils Relative: 2 %
HCT: 39.8 % (ref 39.0–52.0)
Hemoglobin: 13.4 g/dL (ref 13.0–17.0)
Immature Granulocytes: 0 %
Lymphocytes Relative: 23 %
Lymphs Abs: 1.7 10*3/uL (ref 0.7–4.0)
MCH: 30.3 pg (ref 26.0–34.0)
MCHC: 33.7 g/dL (ref 30.0–36.0)
MCV: 90 fL (ref 80.0–100.0)
Monocytes Absolute: 0.5 10*3/uL (ref 0.1–1.0)
Monocytes Relative: 7 %
Neutro Abs: 5.2 10*3/uL (ref 1.7–7.7)
Neutrophils Relative %: 67 %
Platelet Count: 204 10*3/uL (ref 150–400)
RBC: 4.42 MIL/uL (ref 4.22–5.81)
RDW: 12.8 % (ref 11.5–15.5)
WBC Count: 7.7 10*3/uL (ref 4.0–10.5)
nRBC: 0 % (ref 0.0–0.2)

## 2019-03-30 LAB — LACTATE DEHYDROGENASE: LDH: 134 U/L (ref 98–192)

## 2019-03-30 MED ORDER — IOHEXOL 300 MG/ML  SOLN
100.0000 mL | Freq: Once | INTRAMUSCULAR | Status: AC | PRN
  Administered 2019-03-30: 100 mL via INTRAVENOUS

## 2019-04-02 ENCOUNTER — Inpatient Hospital Stay (HOSPITAL_BASED_OUTPATIENT_CLINIC_OR_DEPARTMENT_OTHER): Payer: Medicare Other | Admitting: Hematology

## 2019-04-02 ENCOUNTER — Other Ambulatory Visit: Payer: Self-pay

## 2019-04-02 ENCOUNTER — Encounter: Payer: Self-pay | Admitting: Hematology

## 2019-04-02 VITALS — BP 132/85 | HR 62 | Temp 96.4°F | Resp 19 | Ht 71.0 in | Wt 273.0 lb

## 2019-04-02 DIAGNOSIS — R933 Abnormal findings on diagnostic imaging of other parts of digestive tract: Secondary | ICD-10-CM

## 2019-04-02 DIAGNOSIS — C8190 Hodgkin lymphoma, unspecified, unspecified site: Secondary | ICD-10-CM

## 2019-04-02 DIAGNOSIS — R509 Fever, unspecified: Secondary | ICD-10-CM | POA: Diagnosis not present

## 2019-04-02 DIAGNOSIS — R0602 Shortness of breath: Secondary | ICD-10-CM | POA: Diagnosis not present

## 2019-04-02 DIAGNOSIS — R61 Generalized hyperhidrosis: Secondary | ICD-10-CM | POA: Diagnosis not present

## 2019-04-02 DIAGNOSIS — Z8572 Personal history of non-Hodgkin lymphomas: Secondary | ICD-10-CM | POA: Diagnosis not present

## 2019-04-02 DIAGNOSIS — R5383 Other fatigue: Secondary | ICD-10-CM | POA: Diagnosis not present

## 2019-04-02 DIAGNOSIS — R634 Abnormal weight loss: Secondary | ICD-10-CM | POA: Diagnosis not present

## 2019-04-02 NOTE — Progress Notes (Signed)
[facility] OFFICE PROGRESS NOTE  Patient Care Team: Tysinger, Camelia Eng, PA-C as PCP - General (Family Medicine) Nahser, Wonda Cheng, MD as PCP - Cardiology (Cardiology) Constance Haw, MD as PCP - Electrophysiology (Cardiology)  HEME/ONC OVERVIEW: 1. Stage III classic Hodgkin lymphoma, subtype NOS due to limited specimen; currently NED  -Late 2019:   FDG-avid cervical, mediastinal and upper abdominal LN involement  R cervical LN bx, Hodgkin lymphoma, insufficient tissue for subtype  -11/2017 - 03/2018: ABVD x 5 and a half cycles   CR after 2 cycles of ABVD; chemotherapy delayed due to SVT requiring ablation in 01/2018  Treatment discontinued early due to patient declining further treatment  CR on end-of-treatment PET  -On surveillance   03/2019: NED on CT (1 year post-treatment)  2. Port placement in 11/2017   CHEMOTHERAPY REGIMEN:  11/26/2017 - 04/01/2018: AVD x 5 and a half cycle; patient declined further treatment  On surveillance   ASSESSMENT & PLAN:   Stage III classic Hodgkin lymphoma, subtype NOS; favorable risk  -Clinically, patient is doing well without any constitutional symptoms; no evidence of lymphadenopathy on exam -I independently reviewed the radiologic measures of recent CT neck, chest, and abdomen/pelvis, and agree with findings documented.  In summary, CT showed no evidence of recurrent disease. -I reviewed imaging results in detail with the patient -I counseled the patient on any concerning symptoms, including persistent fever, night sweats, unexplained weight loss, or enlarging LN's, for which Bradley Novak is instructed to contact us anytime -Based on CR, follow-up will be as below:  Labs and exam q3-72months x 1-2 years, then q6-78months until Year 3, and then annually  CT neck, chest, and AP at 24 months following completion of therapy (03/2020)  Annual influenza vaccine   Abnormal sigmoid colon uptake -Noted incidentally on PET in  05/2018 -No GI symptoms -Colonoscopy not yet scheduled due to elective procedures on hold in the pandemic -I encouraged the patient to reach out to gastroenterology to set up colonoscopy when able  Orders Placed This Encounter  Procedures  . CBC with Differential (Cancer Center Only)    Standing Status:   Future    Standing Expiration Date:   05/06/2020  . CMP ([facility] only)    Standing Status:   Future    Standing Expiration Date:   05/06/2020  . Lactate dehydrogenase    Standing Status:   Future    Standing Expiration Date:   05/06/2020   The total time spent in the encounter was 35 minutes, including face-to-face time with the patient, review of various tests results, order additional studies/medications, documentation, and coordination of care plan.   All questions were answered. The patient knows to call the clinic with any problems, questions or concerns. No barriers to learning was detected.  [name], MD 3/18/202111:01 AM  CHIEF COMPLAINT: "I am doing fine"  INTERVAL HISTORY: Bradley Novak returns clinic for follow-up of Stage III Hodgkin lymphoma on surveillance.  Patient reports that Bradley Novak still has periodic fatigue and mild exertional dyspnea, but overall his symptoms are stable.  Bradley Novak denies any constitutional symptoms, lymphadenopathy, chest pain, palpitation, or syncope.  Bradley Novak had his Covid vaccines.  Bradley Novak denies any other complaint today.  REVIEW OF SYSTEMS:   Constitutional: ( - ) fevers, ( - )  chills , ( - ) night sweats Eyes: ( - ) blurriness of vision, ( - ) double vision, ( - ) watery eyes Ears, nose, mouth, throat, and face: ( - )  mucositis, ( - ) sore throat Respiratory: ( - ) cough, ( - ) dyspnea, ( - ) wheezes Cardiovascular: ( - ) palpitation, ( - ) chest discomfort, ( - ) lower extremity swelling Gastrointestinal:  ( - ) nausea, ( - ) heartburn, ( - ) change in bowel habits Skin: ( - ) abnormal skin rashes Lymphatics: ( - ) new lymphadenopathy, ( - ) easy  bruising Neurological: ( - ) numbness, ( - ) tingling, ( - ) new weaknesses Behavioral/Psych: ( - ) mood change, ( - ) new changes  All other systems were reviewed with the patient and are negative.  SUMMARY OF ONCOLOGIC HISTORY: Oncology History  Hodgkin lymphoma ([place])  08/29/2017 Initial Diagnosis   Hodgkin lymphoma ([place])   10/07/2017 Imaging   CT neck w/ contrast: Enlarged nodes in the right jugular chain, posterior triangle, and supraclavicular fossa. The pattern is most concerning for lymphoma; multiple nodes are superficial and amenable to biopsy.  CT CAP w/ contrast: 1. Right supraclavicular and subcarinal adenopathy is identified. Differential considerations include lymphoproliferative disorder, metastatic adenopathy, granulomatous inflammation/infection or reactive adenopathy. Consider further evaluation with tissue sampling. 2. No acute findings, mass or adenopathy within the abdomen. 3. Lungs are clear.  No evidence for pneumonia.  4.  Aortic Atherosclerosis (ICD10-I70.0).   10/25/2017 Procedure   US-guided R cervical LN biopsy   10/25/2017 Pathology Results   (Accession: [id number]) Lymph node, needle/core biopsy, Right Cervical - CLASSIC HODGKIN LYMPHOMA - SEE COMMENT - CD30 (1%) - Unable to subtype due to limited sample specimen   11/12/2017 Cancer Staging   Staging form: Hodgkin and Non-Hodgkin Lymphoma, AJCC 8th Edition - Clinical stage from 11/12/2017: Stage II (Hodgkin lymphoma, A - Asymptomatic) - Signed by [name], MD on 11/12/2017   11/21/2017 -  Chemotherapy   The patient had DOXOrubicin (ADRIAMYCIN) chemo injection 60 mg, 25 mg/m2 = 60 mg, Intravenous,  Once, 0 of 4 cycles palonosetron (ALOXI) injection 0.25 mg, 0.25 mg, Intravenous,  Once, 0 of 4 cycles bleomycin (BLEOCIN) 24 Units in sodium chloride 0.9 % 50 mL chemo infusion, 10 Units/m2 = 24 Units, Intravenous,  Once, 0 of 4 cycles dacarbazine (DTIC) 910 mg in sodium chloride 0.9 % 250 mL chemo  infusion, 375 mg/m2 = 910 mg, Intravenous,  Once, 0 of 4 cycles vinBLAStine (VELBAN) 14.6 mg in sodium chloride 0.9 % 50 mL chemo infusion, 6 mg/m2 = 14.6 mg, Intravenous, Once, 0 of 4 cycles fosaprepitant (EMEND) 150 mg, dexamethasone (DECADRON) 12 mg in sodium chloride 0.9 % 145 mL IVPB, , Intravenous,  Once, 0 of 4 cycles  for chemotherapy treatment.    11/21/2017 Imaging   PET: IMPRESSION: 1. Multiple relatively small hypermetabolic lymph nodes in the RIGHT neck, mediastinum and limited involvement of the upper abdomen. Lymph node activity is high (greater than liver, Deauville 4). 2. The most intense enlarged lymph node is anterior to the sternocleidomastoid muscle on the RIGHT. There are multiple assessable lymph nodes in the RIGHT cervical chain. 3. Minimal hypermetabolic adenopathy in the upper abdomen. No pelvic or inguinal metastatic adenopathy. 4. Normal spleen and bone marrow. 5. Single focus of metabolic activity in the sigmoid colon. Recommend screening colonoscopy if patient is not current for such.   01/10/2018 Imaging   PET (after 2 cycles of ABVD) IMPRESSION: 1. Interval complete response to therapy. No abnormal areas of persistent hypermetabolic adenopathy identified. 2.  Aortic Atherosclerosis (ICD10-I70.0). 3. Multi vessel coronary artery atherosclerotic calcifications.   05/16/2018 Imaging   Post-treatment PET:  IMPRESSION: 1. Small right cervical lymph nodes have only very low-grade activity, about half that of blood pool, and are small in size. Technically these are Deauville 2 as the lymph nodes have not completely resolved. 2. New focal hyperintensity in the sigmoid colon, maximum SUV 32.8 (Deauville 5). This was not present on 01/10/2018 and so presumably would represent physiologic activity, but the focal nature is unusual and a polyp is difficult to totally exclude. Correlate with colon screening history in determining whether further colon workup is  warranted. 3. Prominent activity (Deauville 5) in the prostate gland, especially the peripheral zone, which is new and is associated with new periprostatic stranding. Prostatitis is favored given this constellation of findings.     I have reviewed the past medical history, past surgical history, social history and family history with the patient and they are unchanged from previous note.  ALLERGIES:  has No Known Allergies.  MEDICATIONS:  Current Outpatient Medications  Medication Sig Dispense Refill  . aspirin EC 81 MG tablet Take 1 tablet (81 mg total) by mouth daily. 90 tablet 3  . atorvastatin (LIPITOR) 20 MG tablet TAKE 1 TABLET BY MOUTH DAILY 90 tablet 3  . carvedilol (COREG) 25 MG tablet Take 1 tablet (25 mg total) by mouth 2 (two) times daily. 180 tablet 3  . Cholecalciferol (VITAMIN D PO) Take 1 capsule by mouth at bedtime.    Marland Kitchen esomeprazole (NEXIUM) 20 MG capsule Take 20 mg by mouth 2 (two) times daily.    Marland Kitchen losartan (COZAAR) 50 MG tablet Take 1 tablet (50 mg total) by mouth daily. 90 tablet 3  . potassium chloride SA (KLOR-CON) 20 MEQ tablet Take 1 tablet (20 mEq total) by mouth daily. 90 tablet 1  . vitamin B-12 (CYANOCOBALAMIN) 100 MCG tablet Take 100 mcg by mouth daily.    . nitroGLYCERIN (NITROSTAT) 0.3 MG SL tablet Place 1 tablet (0.3 mg total) under the tongue every 5 (five) minutes as needed for chest pain. (Patient not taking: Reported on 04/02/2019) 30 tablet 0   No current facility-administered medications for this visit.    PHYSICAL EXAMINATION: ECOG PERFORMANCE STATUS: 1 - Symptomatic but completely ambulatory  Today's Vitals   04/02/19 1039  BP: 132/85  Pulse: 62  Resp: 19  Temp: (!) 96.4 F (35.8 C)  TempSrc: Temporal  SpO2: 98%  Weight: 273 lb (123.8 kg)  Height: 5\' 11"  (1.803 m)  PainSc: 0-No pain   Body mass index is 38.08 kg/m.  Filed Weights   04/02/19 1039  Weight: 273 lb (123.8 kg)    GENERAL: alert, no distress and comfortable SKIN:  skin color, texture, turgor are normal, no rashes or significant lesions EYES: conjunctiva are pink and non-injected, sclera clear OROPHARYNX: no exudate, no erythema; lips, buccal mucosa, and tongue normal  NECK: supple, non-tender LYMPH:  no palpable lymphadenopathy in the cervical LUNGS: clear to auscultation with normal breathing effort HEART: regular rate & rhythm and no murmurs and no lower extremity edema ABDOMEN: soft, non-tender, non-distended, normal bowel sounds Musculoskeletal: no cyanosis of digits and no clubbing  PSYCH: alert & oriented x 3, fluent speech  LABORATORY DATA:  I have reviewed the data as listed    Component Value Date/Time   NA 139 03/30/2019 1051   NA 140 02/20/2019 1534   K 4.2 03/30/2019 1051   CL 104 03/30/2019 1051   CO2 28 03/30/2019 1051   GLUCOSE 124 (H) 03/30/2019 1051   BUN 23 03/30/2019 1051   BUN 16  02/20/2019 1534   CREATININE 1.06 03/30/2019 1051   CREATININE 0.83 09/27/2014 0001   CALCIUM 9.6 03/30/2019 1051   PROT 6.7 03/30/2019 1051   PROT 7.4 08/29/2017 1155   ALBUMIN 4.2 03/30/2019 1051   ALBUMIN 4.3 02/20/2019 1534   AST 15 03/30/2019 1051   ALT 16 03/30/2019 1051   ALKPHOS 50 03/30/2019 1051   BILITOT 0.7 03/30/2019 1051   GFRNONAA >60 03/30/2019 1051   GFRAA >60 03/30/2019 1051    No results found for: SPEP, UPEP  Lab Results  Component Value Date   WBC 7.7 03/30/2019   NEUTROABS 5.2 03/30/2019   HGB 13.4 03/30/2019   HCT 39.8 03/30/2019   MCV 90.0 03/30/2019   PLT 204 03/30/2019      Chemistry      Component Value Date/Time   NA 139 03/30/2019 1051   NA 140 02/20/2019 1534   K 4.2 03/30/2019 1051   CL 104 03/30/2019 1051   CO2 28 03/30/2019 1051   BUN 23 03/30/2019 1051   BUN 16 02/20/2019 1534   CREATININE 1.06 03/30/2019 1051   CREATININE 0.83 09/27/2014 0001      Component Value Date/Time   CALCIUM 9.6 03/30/2019 1051   ALKPHOS 50 03/30/2019 1051   AST 15 03/30/2019 1051   ALT 16 03/30/2019 1051    BILITOT 0.7 03/30/2019 1051       RADIOGRAPHIC STUDIES: I have personally reviewed the radiological images as listed below and agreed with the findings in the report. CT Soft Tissue Neck W Contrast  Result Date: 03/30/2019 CLINICAL DATA:  Hematologic malignancy, surveillance. EXAM: CT NECK WITH CONTRAST TECHNIQUE: Multidetector CT imaging of the neck was performed using the standard protocol following the bolus administration of intravenous contrast. CONTRAST:  110mL OMNIPAQUE IOHEXOL 300 MG/ML  SOLN COMPARISON:  CT of the neck September 25, 2018 FINDINGS: Pharynx and larynx: Small band between the vocal cords, unchanged since 2019 may represent a synechia. Correlation with direct exam suggested. Tonsilloliths seen bilaterally. Salivary glands: No inflammation, mass, or stone. Thyroid: Normal. Lymph nodes: There has been further interval decrease in size of the right-sided lymph nodes, mainly located in the posterior triangle with persistent increased number. No lymphadenopathy on the left side. Vascular: Calcified plaques are seen in the bilateral carotid bifurcations without hemodynamically significant stenosis. Small calcified plaques are also seen in the aortic arch, in the intracranial segment of the bilateral vertebral arteries and the bilateral carotid siphons. Limited intracranial: Negative. Visualized orbits: Negative. Mastoids and visualized paranasal sinuses: Clear. Skeleton: Degenerative changes of the cervical spine are noted with multilevel neural foraminal narrowing, more pronounced on the left side at C3-4, where there is severe stenosis. Upper chest: Negative. Please see separate detailed report of the chest. Other: None. IMPRESSION: 1. Further interval decrease in size of the right-sided lymph nodes, mainly located in the posterior triangle. No new adenopathy. 2. Small band between the vocal cords, unchanged since 2019 may represent a synechia. Correlation with direct exam suggested. 3.  Degenerative changes of the cervical spine with multilevel neural foraminal narrowing, more pronounced on the left side at C3-4. 4. Please see separate detailed report of the chest. Aortic Atherosclerosis (ICD10-I70.0). Electronically Signed   By: Pedro Earls M.D.   On: 03/30/2019 12:46   CT chest w/ contrast  Result Date: 03/30/2019 CLINICAL DATA:  Hodgkin lymphoma status post therapy. Restaging. EXAM: CT CHEST, ABDOMEN, AND PELVIS WITH CONTRAST TECHNIQUE: Multidetector CT imaging of the chest, abdomen and pelvis was  performed following the standard protocol during bolus administration of intravenous contrast. CONTRAST:  171mL OMNIPAQUE IOHEXOL 300 MG/ML  SOLN COMPARISON:  09/25/2018 CT chest, abdomen and pelvis. 05/16/2018 PET-CT. FINDINGS: CT CHEST FINDINGS Cardiovascular: Normal heart size. No significant pericardial effusion/thickening. Three-vessel coronary atherosclerosis. Atherosclerotic nonaneurysmal thoracic aorta. Normal caliber pulmonary arteries. No central pulmonary emboli. Mediastinum/Nodes: No discrete thyroid nodules. Unremarkable esophagus. No pathologically enlarged axillary, mediastinal or hilar lymph nodes. Lungs/Pleura: No pneumothorax. No pleural effusion. No acute consolidative airspace disease, lung masses or significant pulmonary nodules. Musculoskeletal: No aggressive appearing focal osseous lesions. Mild thoracic spondylosis. CT ABDOMEN PELVIS FINDINGS Hepatobiliary: Normal liver with no liver mass. Normal gallbladder with no radiopaque cholelithiasis. No biliary ductal dilatation. Pancreas: Normal, with no mass or duct dilation. Spleen: Normal size. No mass. Adrenals/Urinary Tract: Normal adrenals. Normal kidneys with no hydronephrosis and no renal mass. Normal bladder. Stomach/Bowel: Normal non-distended stomach. Normal caliber small bowel with no small bowel wall thickening. Normal appendix. Oral contrast transits to the rectum. Normal large bowel with no  diverticulosis, large bowel wall thickening or pericolonic fat stranding. Vascular/Lymphatic: Atherosclerotic nonaneurysmal abdominal aorta. Patent portal, splenic, hepatic and renal veins. No pathologically enlarged lymph nodes in the abdomen or pelvis. Reproductive: Top-normal size prostate. Other: No pneumoperitoneum, ascites or focal fluid collection. Musculoskeletal: No aggressive appearing focal osseous lesions. Mild lumbar spondylosis. IMPRESSION: 1. No lymphadenopathy or other evidence of recurrent lymphoma in the chest, abdomen or pelvis. 2. Three-vessel coronary atherosclerosis. 3. Aortic Atherosclerosis (ICD10-I70.0). Electronically Signed   By: Ilona Sorrel M.D.   On: 03/30/2019 14:03   CT AP w/ contrast  Result Date: 03/30/2019 CLINICAL DATA:  Hodgkin lymphoma status post therapy. Restaging. EXAM: CT CHEST, ABDOMEN, AND PELVIS WITH CONTRAST TECHNIQUE: Multidetector CT imaging of the chest, abdomen and pelvis was performed following the standard protocol during bolus administration of intravenous contrast. CONTRAST:  167mL OMNIPAQUE IOHEXOL 300 MG/ML  SOLN COMPARISON:  09/25/2018 CT chest, abdomen and pelvis. 05/16/2018 PET-CT. FINDINGS: CT CHEST FINDINGS Cardiovascular: Normal heart size. No significant pericardial effusion/thickening. Three-vessel coronary atherosclerosis. Atherosclerotic nonaneurysmal thoracic aorta. Normal caliber pulmonary arteries. No central pulmonary emboli. Mediastinum/Nodes: No discrete thyroid nodules. Unremarkable esophagus. No pathologically enlarged axillary, mediastinal or hilar lymph nodes. Lungs/Pleura: No pneumothorax. No pleural effusion. No acute consolidative airspace disease, lung masses or significant pulmonary nodules. Musculoskeletal: No aggressive appearing focal osseous lesions. Mild thoracic spondylosis. CT ABDOMEN PELVIS FINDINGS Hepatobiliary: Normal liver with no liver mass. Normal gallbladder with no radiopaque cholelithiasis. No biliary ductal  dilatation. Pancreas: Normal, with no mass or duct dilation. Spleen: Normal size. No mass. Adrenals/Urinary Tract: Normal adrenals. Normal kidneys with no hydronephrosis and no renal mass. Normal bladder. Stomach/Bowel: Normal non-distended stomach. Normal caliber small bowel with no small bowel wall thickening. Normal appendix. Oral contrast transits to the rectum. Normal large bowel with no diverticulosis, large bowel wall thickening or pericolonic fat stranding. Vascular/Lymphatic: Atherosclerotic nonaneurysmal abdominal aorta. Patent portal, splenic, hepatic and renal veins. No pathologically enlarged lymph nodes in the abdomen or pelvis. Reproductive: Top-normal size prostate. Other: No pneumoperitoneum, ascites or focal fluid collection. Musculoskeletal: No aggressive appearing focal osseous lesions. Mild lumbar spondylosis. IMPRESSION: 1. No lymphadenopathy or other evidence of recurrent lymphoma in the chest, abdomen or pelvis. 2. Three-vessel coronary atherosclerosis. 3. Aortic Atherosclerosis (ICD10-I70.0). Electronically Signed   By: Ilona Sorrel M.D.   On: 03/30/2019 14:03

## 2019-04-03 ENCOUNTER — Inpatient Hospital Stay: Payer: Medicare Other | Admitting: Hematology

## 2019-04-06 ENCOUNTER — Other Ambulatory Visit: Payer: Medicare Other

## 2019-04-07 ENCOUNTER — Encounter: Payer: Self-pay | Admitting: Medical

## 2019-04-14 ENCOUNTER — Other Ambulatory Visit: Payer: Self-pay

## 2019-04-14 ENCOUNTER — Encounter: Payer: Self-pay | Admitting: Cardiology

## 2019-04-14 ENCOUNTER — Ambulatory Visit (INDEPENDENT_AMBULATORY_CARE_PROVIDER_SITE_OTHER): Payer: Medicare Other | Admitting: Cardiology

## 2019-04-14 VITALS — BP 140/72 | HR 56 | Ht 71.0 in | Wt 275.0 lb

## 2019-04-14 DIAGNOSIS — I25119 Atherosclerotic heart disease of native coronary artery with unspecified angina pectoris: Secondary | ICD-10-CM | POA: Diagnosis not present

## 2019-04-14 DIAGNOSIS — I471 Supraventricular tachycardia: Secondary | ICD-10-CM

## 2019-04-14 MED ORDER — CARVEDILOL 12.5 MG PO TABS: 12.5000 mg | ORAL_TABLET | Freq: Two times a day (BID) | ORAL | 2 refills | Status: DC

## 2019-04-14 NOTE — Progress Notes (Signed)
Electrophysiology Office Note   Date:  04/14/2019   ID:  Bradley, Novak 09-Sep-1950, MRN [id number]  PCP:  Carlena Hurl, PA-C  Cardiologist:  Nahser Primary Electrophysiologist:  Lilton Pare Meredith Leeds, MD    No chief complaint on file.    History of Present Illness: Bradley Novak is a 69 y.o. male who is being seen today for the evaluation of SVT at the request of Nahser. Presenting today for electrophysiology evaluation.  He has a history of Hodgkin's lymphoma, hypertension, and ORT.  He underwent catheter ablation 01/29/2018 with successful ablation of an accessory pathway at [facility].  Unfortunately he came back the next day with recurrent SVT.  Today, denies symptoms of palpitations, chest pain, shortness of breath, orthopnea, PND, lower extremity edema, claudication, dizziness, presyncope, syncope, bleeding, or neurologic sequela. The patient is tolerating medications without difficulties.  He continues to do well.  He is unfortunately had further episodes of SVT.  He is also having some weakness and fatigue.  His episodes of SVT occur once or twice a week, though he can go a few weeks without having episodes.  No exacerbating or alleviating factors.  He gets a headache and episodes last for a few minutes.  The longest episode lasted 30 minutes.  His heart rates during his episodes are between 130 and 140.   Past Medical History:  Diagnosis Date  . GERD (gastroesophageal reflux disease)   . History of chemotherapy    7 months per patient  . History of hiatal hernia   . Hodgkin's lymphoma ([place]) 09/2017  . Hyperlipidemia   . Hypertension 2006  . Obesity   . Pneumonia 09/2017  . SVT (supraventricular tachycardia) ([place])   . Wears glasses    Past Surgical History:  Procedure Laterality Date  . CARDIAC CATHETERIZATION  1980s  . IR IMAGING GUIDED PORT INSERTION  11/20/2017  . IR REMOVAL TUN ACCESS W/ PORT W/O FL MOD SED  06/02/2018  . SVT ABLATION  01/29/2018  . SVT  ABLATION N/A 01/29/2018   Procedure: SVT ABLATION;  Surgeon: Constance Haw, MD;  Location: [facility];  Service: Cardiovascular;  Laterality: N/A;     Current Outpatient Medications  Medication Sig Dispense Refill  . aspirin EC 81 MG tablet Take 1 tablet (81 mg total) by mouth daily. 90 tablet 3  . atorvastatin (LIPITOR) 20 MG tablet TAKE 1 TABLET BY MOUTH DAILY 90 tablet 3  . Cholecalciferol (VITAMIN D PO) Take 1 capsule by mouth at bedtime.    Marland Kitchen esomeprazole (NEXIUM) 20 MG capsule Take 20 mg by mouth 2 (two) times daily.    Marland Kitchen losartan (COZAAR) 50 MG tablet Take 1 tablet (50 mg total) by mouth daily. 90 tablet 3  . nitroGLYCERIN (NITROSTAT) 0.3 MG SL tablet Place 1 tablet (0.3 mg total) under the tongue every 5 (five) minutes as needed for chest pain. 30 tablet 0  . vitamin B-12 (CYANOCOBALAMIN) 100 MCG tablet Take 100 mcg by mouth daily.    . carvedilol (COREG) 12.5 MG tablet Take 1 tablet (12.5 mg total) by mouth 2 (two) times daily. 180 tablet 2   No current facility-administered medications for this visit.    Allergies:   Patient has no known allergies.   Social History:  The patient  reports that he quit smoking about 47 years ago. His smoking use included cigarettes. He has a 10.00 pack-year smoking history. He has never used smokeless tobacco. He reports that he  does not drink alcohol or use drugs.   Family History:  The patient's family history includes Hypertension in his brother, brother, brother, brother, and father.   ROS:  Please see the history of present illness.   Otherwise, review of systems is positive for none.   All other systems are reviewed and negative.   PHYSICAL EXAM: VS:  BP 140/72   Pulse (!) 56   Ht 5\' 11"  (1.803 m)   Wt 275 lb (124.7 kg)   SpO2 98%   BMI 38.35 kg/m  , BMI Body mass index is 38.35 kg/m. GEN: Well nourished, well developed, in no acute distress  HEENT: normal  Neck: no JVD, carotid bruits, or masses Cardiac: RRR; no  murmurs, rubs, or gallops,no edema  Respiratory:  clear to auscultation bilaterally, normal work of breathing GI: soft, nontender, nondistended, + BS MS: no deformity or atrophy  Skin: warm and dry Neuro:  Strength and sensation are intact Psych: euthymic mood, full affect  EKG:  EKG is ordered today. Personal review of the ekg ordered shows sinus rhythm, rate 56  Recent Labs: 04/14/2018: Magnesium 1.7 09/25/2018: TSH 3.202 03/30/2019: ALT 16; BUN 23; Creatinine 1.06; Hemoglobin 13.4; Platelet Count 204; Potassium 4.2; Sodium 139    Lipid Panel     Component Value Date/Time   CHOL 146 02/20/2019 1534   TRIG 221 (H) 02/20/2019 1534   HDL 46 02/20/2019 1534   CHOLHDL 3.2 02/20/2019 1534   CHOLHDL 5.1 (H) 09/27/2014 0001   VLDL 37 (H) 09/27/2014 0001   LDLCALC 64 02/20/2019 1534     Wt Readings from Last 3 Encounters:  04/14/19 275 lb (124.7 kg)  04/02/19 273 lb (123.8 kg)  02/20/19 275 lb 6.4 oz (124.9 kg)      Other studies Reviewed: Additional studies/ records that were reviewed today include: TTE 11/15/17  Review of the above records today demonstrates:  - Left ventricle: The cavity size was normal. Systolic function was   normal. The estimated ejection fraction was in the range of 55%   to 60%. Wall motion was normal; there were no regional wall   motion abnormalities. Left ventricular diastolic function   parameters were normal.   ASSESSMENT AND PLAN:  1.  ORT: Status post ablation 01/29/2018.  He is unfortunately had continued recurrences and is now on carvedilol.  His recurrences occurred during therapy for his lymphoma.  He has started to have recurrences of his SVT.  He is also having some fatigue unfortunately.  Due to his fatigue Caedin Mogan decrease his carvedilol to 12.5 mg.  He has 30 mg tabs of diltiazem to take for SVT.  His episodes have been short-lived.  No ablation indicated at this time.  He also wishes to stop his aspirin.  2.  Hypertension: Mildly elevated  today but usually well controlled.   Current medicines are reviewed at length with the patient today.   The patient does not have concerns regarding his medicines.  The following changes were made today: Stop aspirin, decrease carvedilol  Labs/ tests ordered today include:  Orders Placed This Encounter  Procedures  . EKG 12-Lead     Disposition:   FU with Ziyan Hillmer 6 months  Signed, Gracilyn Gunia Meredith Leeds, MD  04/14/2019 8:47 AM     [facility] [address] [place] [address] [phone] (office) [phone] (fax)

## 2019-04-14 NOTE — Patient Instructions (Signed)
Medication Instructions:  Your physician has recommended you make the following change in your medication:  1. STOP Aspirin 2. DECREASE Carvedilol to 12.5 mg twice a day  *If you need a refill on your cardiac medications before your next appointment, please call your pharmacy*   Lab Work: None ordered If you have labs (blood work) drawn today and your tests are completely normal, you will receive your results only by: Marland Kitchen MyChart Message (if you have MyChart) OR . A paper copy in the mail If you have any lab test that is abnormal or we need to change your treatment, we will call you to review the results.   Testing/Procedures: None ordered   Follow-Up: At [facility], you and your health needs are our priority.  As part of our continuing mission to provide you with exceptional heart care, we have created designated Provider Care Teams.  These Care Teams include your primary Cardiologist (physician) and Advanced Practice Providers (APPs -  Physician Assistants and Nurse Practitioners) who all work together to provide you with the care you need, when you need it.  We recommend signing up for the patient portal called "MyChart".  Sign up information is provided on this After Visit Summary.  MyChart is used to connect with patients for Virtual Visits (Telemedicine).  Patients are able to view lab/test results, encounter notes, upcoming appointments, etc.  Non-urgent messages can be sent to your provider as well.   To learn more about what you can do with MyChart, go to [email or web address].    Your next appointment:   6 month(s)  The format for your next appointment:   In Person  Provider:   Allegra Lai, MD   Thank you for choosing [facility]!!   Trinidad Curet, RN [phone]    Other Instructions

## 2019-04-15 NOTE — Telephone Encounter (Signed)
Rules keep on changing on Covid We do not need to get Covid testing if you had both shots of vaccine (at least 2 weeks after the second shot)  Plan: -Proceed with colonoscopy with MiraLAX preparation at [facility].  RG  Bre, Please make sure regarding the rule above. Set him up for colonoscopy  RG

## 2019-04-15 NOTE — Telephone Encounter (Signed)
Called and spoke with patient-patient informed of MD recommendations; patient is agreeable with plan of care and has been scheduled for a pre visit on 05/11/2019 at 8:00 am; patient has also been scheduled for a colon on 05/20/2019 at 10:00 am; Patient verbalized understanding of information/instructions;  Patient was advised to call the office at [phone] if questions/concerns arise;

## 2019-04-16 ENCOUNTER — Other Ambulatory Visit: Payer: Self-pay | Admitting: Nurse Practitioner

## 2019-04-16 MED ORDER — LOSARTAN POTASSIUM 50 MG PO TABS: 50.0000 mg | ORAL_TABLET | Freq: Every day | ORAL | 3 refills | Status: DC

## 2019-04-27 IMAGING — PT NM PET TUM IMG RESTAG (PS) SKULL BASE T - THIGH
1 of 8 series · 1 of 25 positions shown · non-contrast
Comparison: 11/21/2017

CLINICAL DATA: Subsequent treatment strategy for lymphoma.

EXAM:
NUCLEAR MEDICINE PET SKULL BASE TO THIGH
TECHNIQUE: 12.5 mCi F-18 FDG was injected intravenously. Full-ring PET imaging
was performed from the skull base to thigh after the radiotracer. CT
data was obtained and used for attenuation correction and anatomic
localization.
Fasting blood glucose: 124 mg/dl

[Series 4: ct sk_thigh 5.0 b31f · axial · 5.0mm · 0.98mm/px · 1 of 244 slices shown]
[im 244/244  brain]
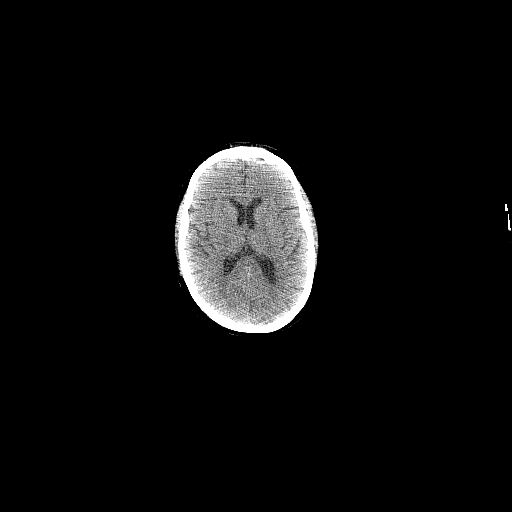

[1 of 25 positions shown; findings below may reference images not displayed]

FINDINGS: Mediastinal blood pool activity: SUV max

Liver activity: SUV max equals

NECK: Previous hypermetabolic right level 2 lymph node has an SUV
max of 2.80, [HOSPITAL] criteria 2. Previous hypermetabolic right
posterior cervical triangle lymph nodes have an SUV max of 1.59,
[HOSPITAL] criteria 2. Previously referenced hypermetabolic right
supraclavicular lymph node has resolved in the interval.

Incidental CT findings: None

CHEST: No hypermetabolic axillary, mediastinal, or hilar lymph
nodes. Index subcarinal lymph node has an SUV max of 2.51, [HOSPITAL]
criteria 2. No hypermetabolic pulmonary nodules.

Incidental CT findings: Aortic atherosclerosis. Calcifications
within the LAD, left circumflex and RCA coronary arteries noted.

ABDOMEN/PELVIS: No abnormal hypermetabolic activity within the
liver, pancreas, adrenal glands, or spleen. No hypermetabolic lymph
nodes in the abdomen or pelvis. The spleen measures 10.3 x 4.7 x
13.0 cm (volume = 330 cm^3). No abnormal uptake identified within
the spleen, which has an SUV max is equal to 3.27.

Incidental CT findings: none

SKELETON: No focal hypermetabolic activity to suggest skeletal
metastasis.

Incidental CT findings: none
IMPRESSION: 1. Interval complete response to therapy. No abnormal areas of
persistent hypermetabolic adenopathy identified.
2.  Aortic Atherosclerosis ([id number]).
3. Multi vessel coronary artery atherosclerotic calcifications.

## 2019-04-29 DIAGNOSIS — M170 Bilateral primary osteoarthritis of knee: Secondary | ICD-10-CM | POA: Diagnosis not present

## 2019-04-29 DIAGNOSIS — M1711 Unilateral primary osteoarthritis, right knee: Secondary | ICD-10-CM | POA: Diagnosis not present

## 2019-04-29 DIAGNOSIS — M1712 Unilateral primary osteoarthritis, left knee: Secondary | ICD-10-CM | POA: Diagnosis not present

## 2019-05-11 ENCOUNTER — Ambulatory Visit (AMBULATORY_SURGERY_CENTER): Payer: Self-pay | Admitting: *Deleted

## 2019-05-11 ENCOUNTER — Other Ambulatory Visit: Payer: Self-pay

## 2019-05-11 VITALS — Temp 97.0°F | Ht 71.0 in | Wt 271.2 lb

## 2019-05-11 DIAGNOSIS — Z1211 Encounter for screening for malignant neoplasm of colon: Secondary | ICD-10-CM

## 2019-05-11 NOTE — Progress Notes (Signed)
Patient denies any allergies to egg or soy products. Patient denies complications with anesthesia/sedation.  Patient denies oxygen use at home and denies diet medications. Emmi instructions for colonoscopy explained and given to patient.  Patient had both covid vaccinations, last one 03/02/19.

## 2019-05-20 ENCOUNTER — Ambulatory Visit (AMBULATORY_SURGERY_CENTER): Payer: Medicare Other | Admitting: Gastroenterology

## 2019-05-20 ENCOUNTER — Other Ambulatory Visit: Payer: Self-pay

## 2019-05-20 ENCOUNTER — Encounter: Payer: Self-pay | Admitting: Gastroenterology

## 2019-05-20 VITALS — BP 127/91 | HR 61 | Temp 97.1°F | Resp 16 | Ht 71.0 in | Wt 271.0 lb

## 2019-05-20 DIAGNOSIS — R948 Abnormal results of function studies of other organs and systems: Secondary | ICD-10-CM

## 2019-05-20 DIAGNOSIS — I2510 Atherosclerotic heart disease of native coronary artery without angina pectoris: Secondary | ICD-10-CM | POA: Diagnosis not present

## 2019-05-20 DIAGNOSIS — K5730 Diverticulosis of large intestine without perforation or abscess without bleeding: Secondary | ICD-10-CM

## 2019-05-20 DIAGNOSIS — C187 Malignant neoplasm of sigmoid colon: Secondary | ICD-10-CM | POA: Diagnosis not present

## 2019-05-20 DIAGNOSIS — D125 Benign neoplasm of sigmoid colon: Secondary | ICD-10-CM

## 2019-05-20 DIAGNOSIS — D120 Benign neoplasm of cecum: Secondary | ICD-10-CM | POA: Diagnosis not present

## 2019-05-20 DIAGNOSIS — Z1211 Encounter for screening for malignant neoplasm of colon: Secondary | ICD-10-CM

## 2019-05-20 DIAGNOSIS — K648 Other hemorrhoids: Secondary | ICD-10-CM

## 2019-05-20 DIAGNOSIS — R933 Abnormal findings on diagnostic imaging of other parts of digestive tract: Secondary | ICD-10-CM | POA: Diagnosis not present

## 2019-05-20 DIAGNOSIS — I10 Essential (primary) hypertension: Secondary | ICD-10-CM | POA: Diagnosis not present

## 2019-05-20 HISTORY — PX: COLONOSCOPY: SHX174

## 2019-05-20 LAB — HM COLONOSCOPY

## 2019-05-20 MED ORDER — SODIUM CHLORIDE 0.9 % IV SOLN: 500.0000 mL | Freq: Once | INTRAVENOUS | Status: DC

## 2019-05-20 NOTE — Progress Notes (Signed)
Report given to PACU, vss 

## 2019-05-20 NOTE — Patient Instructions (Addendum)
YOU HAD AN ENDOSCOPIC PROCEDURE TODAY AT [facility]:   Refer to the procedure report that was given to you for any specific questions about what was found during the examination.  If the procedure report does not answer your questions, please call your gastroenterologist to clarify.  If you requested that your care partner not be given the details of your procedure findings, then the procedure report has been included in a sealed envelope for you to review at your convenience later.  YOU SHOULD EXPECT: Some feelings of bloating in the abdomen. Passage of more gas than usual.  Walking can help get rid of the air that was put into your GI tract during the procedure and reduce the bloating. If you had a lower endoscopy (such as a colonoscopy or flexible sigmoidoscopy) you may notice spotting of blood in your stool or on the toilet paper. If you underwent a bowel prep for your procedure, you may not have a normal bowel movement for a few days.  Please Note:  You might notice some irritation and congestion in your nose or some drainage.  This is from the oxygen used during your procedure.  There is no need for concern and it should clear up in a day or so.  SYMPTOMS TO REPORT IMMEDIATELY:   Following lower endoscopy (colonoscopy or flexible sigmoidoscopy):  Excessive amounts of blood in the stool  Significant tenderness or worsening of abdominal pains  Swelling of the abdomen that is new, acute  Fever of 100F or higher   For urgent or emergent issues, a gastroenterologist can be reached at any hour by calling [phone]. Do not use MyChart messaging for urgent concerns.    DIET:  We do recommend a small meal at first, but then you may proceed to your regular diet.  Drink plenty of fluids but you should avoid alcoholic beverages for 24 hours.  ACTIVITY:  You should plan to take it easy for the rest of today and you should NOT DRIVE or use heavy machinery until tomorrow (because  of the sedation medicines used during the test).    FOLLOW UP: Our staff will call the number listed on your records 48-72 hours following your procedure to check on you and address any questions or concerns that you may have regarding the information given to you following your procedure. If we do not reach you, we will leave a message.  We will attempt to reach you two times.  During this call, we will ask if you have developed any symptoms of COVID 19. If you develop any symptoms (ie: fever, flu-like symptoms, shortness of breath, cough etc.) before then, please call [phone].  If you test positive for Covid 19 in the 2 weeks post procedure, please call and report this information to [place].    If any biopsies were taken you will be contacted by phone or by letter within the next 1-3 weeks.  Please call us at [phone] if you have not heard about the biopsies in 3 weeks.    SIGNATURES/CONFIDENTIALITY: You and/or your care partner have signed paperwork which will be entered into your electronic medical record.  These signatures attest to the fact that that the information above on your After Visit Summary has been reviewed and is understood.  Full responsibility of the confidentiality of this discharge information lies with you and/or your care-partner.    Handouts were given to your on polyps, diverticulosis and hemorrhoids. Please HOLD aspirin, ibuprofen, naproxen,or  other non-steroidal anti-inflammatory drugs for 5 days due to polyps that were removed from your colon today. You may resume your other current medications today. Await biopsy results. You have 2 metal clips that was applied to your colon.  These clips will eventually fall off.  You may or may not see the metal clips in your stool when they release.  Please call if any questions or concerns.

## 2019-05-20 NOTE — Progress Notes (Signed)
Clip card given to pt and asked him to care this in his wallet.  Maw  No problems noted in the recovery room. maw

## 2019-05-20 NOTE — Progress Notes (Signed)
Called to room to assist during endoscopic procedure.  Patient ID and intended procedure confirmed with present staff. Received instructions for my participation in the procedure from the performing physician.  

## 2019-05-20 NOTE — Progress Notes (Signed)
Temp check by:JB Vital check by:CW  The patient states no changes in medical or surgical history since pre-visit screening on 05/11/19.

## 2019-05-20 NOTE — Op Note (Signed)
Bradley Novak Patient Name: Bradley Novak Procedure Date: 05/20/2019 10:00 AM MRN: [id number] Endoscopist: Jackquline Denmark , MD Age: 69 Referring MD:  Date of Birth: 02-03-1950 Gender: Male Account #: [id number] Procedure:                Colonoscopy Indications:              #1. Abn PET in 05/2018 with new focal FDG avidity                            (SUV 32.8) in the sigmoid colon.                           #2. Hodgkin lymphoma s/p ABVD, still recovering                            from chemo, H/O SVT s/p ablation. Medicines:                Monitored Anesthesia Care Procedure:                Pre-Anesthesia Assessment:                           - Prior to the procedure, a History and Physical                            was performed, and patient medications and                            allergies were reviewed. The patient's tolerance of                            previous anesthesia was also reviewed. The risks                            and benefits of the procedure and the sedation                            options and risks were discussed with the patient.                            All questions were answered, and informed consent                            was obtained. Prior Anticoagulants: The patient has                            taken no previous anticoagulant or antiplatelet                            agents. ASA Grade Assessment: III - A patient with                            severe systemic disease. After reviewing the risks  and benefits, the patient was deemed in                            satisfactory condition to undergo the procedure.                           After obtaining informed consent, the colonoscope                            was passed under direct vision. Throughout the                            procedure, the patient's blood pressure, pulse, and                            oxygen saturations were monitored continuously.  The                            Colonoscope was introduced through the anus and                            advanced to the the cecum, identified by                            appendiceal orifice and ileocecal valve. The                            colonoscopy was performed without difficulty. The                            patient tolerated the procedure well. The quality                            of the bowel preparation was good. The ileocecal                            valve, appendiceal orifice, and rectum were                            photographed. Scope In: 10:02:26 AM Scope Out: 10:28:04 AM Scope Withdrawal Time: 0 hours 22 minutes 53 seconds  Total Procedure Duration: 0 hours 25 minutes 38 seconds  Findings:                 A 10 mm polyp was found in the cecum. The polyp was                            sessile. The polyp was removed with a piecemeal                            technique using a hot snare. Resection and                            retrieval were complete. To prevent bleeding  post-intervention, one hemostatic clip was                            successfully placed (MR conditional). There was no                            bleeding at the end of the procedure.                           A 12 mm polyp was found in the ileocecal valve. The                            polyp was sessile. The polyp was removed with a hot                            snare with piecemeal technique. Resection and                            retrieval were complete. To prevent bleeding                            post-intervention, one hemostatic clip was                            successfully placed (MR conditional) at the base.                            There was no bleeding at the end of the procedure.                           A 12 mm polyp was found in the mid sigmoid colon,                            25 cm from the anal verge. The polyp was                             semi-pedunculated. The polyp was removed with a hot                            snare. Resection and retrieval were complete. Area                            was tattooed with an injection of 1.5 mL of Spot                            just distal to the polypectomy site.                           A few small-mouthed diverticula were found in the                            sigmoid colon.  Non-bleeding internal hemorrhoids were found during                            retroflexion. The hemorrhoids were small.                           The exam was otherwise without abnormality on                            direct and retroflexion views. Complications:            No immediate complications. Estimated Blood Loss:     Estimated blood loss: none. Impression:               - One 10 mm polyp in the cecum, removed piecemeal                            using a hot snare. Resected and retrieved. Clip (MR                            conditional) was placed.                           - One 12 mm polyp at the ileocecal valve, removed                            with a hot snare. Resected and retrieved. Clip (MR                            conditional) was placed.                           - One 12 mm polyp in the mid sigmoid colon, removed                            with a hot snare. Resected and retrieved. Tattooed.                           - Diverticulosis in the sigmoid colon.                           - Non-bleeding internal hemorrhoids.                           - The examination was otherwise normal on direct                            and retroflexion views. Recommendation:           - Patient has a contact number available for                            emergencies. The signs and symptoms of potential                            delayed  complications were discussed with the                            patient. Return to normal activities tomorrow.                             Written discharge instructions were provided to the                            patient. Given the size and nature of the polyps,                            patient is at a higher risk of post polypectomy                            bleeding. Patient and patient's wife do understand.                           - Resume previous diet.                           - No aspirin, ibuprofen, naproxen, or other                            non-steroidal anti-inflammatory drugs for 5 days                            after polyp removal.                           - Await pathology results.                           - Repeat colonoscopy based on pathology results.                           - The findings and recommendations were discussed                            with the patient's wife [name]. Jackquline Denmark, MD 05/20/2019 10:41:29 AM This report has been signed electronically.

## 2019-05-22 ENCOUNTER — Telehealth: Payer: Self-pay

## 2019-05-22 NOTE — Telephone Encounter (Signed)
Left message on 2nd follow up call. 

## 2019-05-22 NOTE — Telephone Encounter (Signed)
1st follow up call made.  NALM 

## 2019-05-26 NOTE — Progress Notes (Signed)
I have discussed in detail with the patient and patient's wife regarding diagnosis of malignant polyp.  I also discussed with pathology.  There is no lymphovascular invasion, and it is moderately differentiated with 1 mm margin.  I have discussed with Dr. Maylon Peppers (patient's oncologist)  Not able to put him on for conference for 5/12  Plan: -Please refer for surgical opinion to colorectal surgery [facility](as per oncology's request as well) -Check CEA (just to get baseline) -Follow-up with me in 4 to 6 weeks. -Will try to put him on for GI oncology conference for next week  RG

## 2019-05-28 ENCOUNTER — Other Ambulatory Visit: Payer: Self-pay

## 2019-05-28 DIAGNOSIS — R948 Abnormal results of function studies of other organs and systems: Secondary | ICD-10-CM

## 2019-05-29 ENCOUNTER — Other Ambulatory Visit: Payer: Medicare Other

## 2019-05-29 DIAGNOSIS — R948 Abnormal results of function studies of other organs and systems: Secondary | ICD-10-CM | POA: Diagnosis not present

## 2019-05-29 DIAGNOSIS — C189 Malignant neoplasm of colon, unspecified: Secondary | ICD-10-CM | POA: Diagnosis not present

## 2019-05-30 LAB — CEA: CEA: 0.8 ng/mL

## 2019-06-02 DIAGNOSIS — C187 Malignant neoplasm of sigmoid colon: Secondary | ICD-10-CM | POA: Diagnosis not present

## 2019-06-03 ENCOUNTER — Other Ambulatory Visit: Payer: Self-pay

## 2019-06-03 ENCOUNTER — Telehealth: Payer: Self-pay | Admitting: Gastroenterology

## 2019-06-03 DIAGNOSIS — Z1211 Encounter for screening for malignant neoplasm of colon: Secondary | ICD-10-CM

## 2019-06-03 DIAGNOSIS — R948 Abnormal results of function studies of other organs and systems: Secondary | ICD-10-CM

## 2019-06-03 NOTE — Telephone Encounter (Signed)
I have called the patient and discussed further course of action.  His case was discussed in oncology conference this morning.  Dr. Johney Maine was present as well.  We have decided to follow him endoscopically for now.   Plan: -Flexible sigmoidoscopy in 3 months. -He is to call us if he starts having any new problems. -I will discuss above with Dr. Maylon Peppers as well  RG  Trish Can you please set him up for flexible sigmoidoscopy in 3 months. RG

## 2019-06-03 NOTE — Telephone Encounter (Signed)
Patient is scheduled for 08/21/19 at 10:30am. Instructions have been mailed to patient.

## 2019-06-24 ENCOUNTER — Encounter: Payer: Self-pay | Admitting: Medical

## 2019-07-14 DIAGNOSIS — M170 Bilateral primary osteoarthritis of knee: Secondary | ICD-10-CM | POA: Diagnosis not present

## 2019-07-14 DIAGNOSIS — M1712 Unilateral primary osteoarthritis, left knee: Secondary | ICD-10-CM | POA: Diagnosis not present

## 2019-07-14 DIAGNOSIS — M1711 Unilateral primary osteoarthritis, right knee: Secondary | ICD-10-CM | POA: Diagnosis not present

## 2019-08-18 DIAGNOSIS — H2513 Age-related nuclear cataract, bilateral: Secondary | ICD-10-CM | POA: Diagnosis not present

## 2019-08-21 ENCOUNTER — Other Ambulatory Visit: Payer: Self-pay

## 2019-08-21 ENCOUNTER — Ambulatory Visit (AMBULATORY_SURGERY_CENTER): Payer: Medicare Other | Admitting: Gastroenterology

## 2019-08-21 ENCOUNTER — Encounter: Payer: Self-pay | Admitting: Gastroenterology

## 2019-08-21 VITALS — BP 136/70 | HR 51 | Temp 96.2°F | Resp 11 | Ht 71.0 in | Wt 271.0 lb

## 2019-08-21 DIAGNOSIS — K5730 Diverticulosis of large intestine without perforation or abscess without bleeding: Secondary | ICD-10-CM | POA: Diagnosis not present

## 2019-08-21 DIAGNOSIS — R948 Abnormal results of function studies of other organs and systems: Secondary | ICD-10-CM

## 2019-08-21 DIAGNOSIS — Z85038 Personal history of other malignant neoplasm of large intestine: Secondary | ICD-10-CM | POA: Diagnosis not present

## 2019-08-21 DIAGNOSIS — I10 Essential (primary) hypertension: Secondary | ICD-10-CM | POA: Diagnosis not present

## 2019-08-21 HISTORY — PX: SIGMOIDOSCOPY: SUR1295

## 2019-08-21 MED ORDER — SODIUM CHLORIDE 0.9 % IV SOLN: 500.0000 mL | Freq: Once | INTRAVENOUS | Status: DC

## 2019-08-21 NOTE — Patient Instructions (Signed)
YOU HAD AN ENDOSCOPIC PROCEDURE TODAY AT THE [facility]:   Refer to the procedure report that was given to you for any specific questions about what was found during the examination.  If the procedure report does not answer your questions, please call your gastroenterologist to clarify.  If you requested that your care partner not be given the details of your procedure findings, then the procedure report has been included in a sealed envelope for you to review at your convenience later.  YOU SHOULD EXPECT: Some feelings of bloating in the abdomen. Passage of more gas than usual.  Walking can help get rid of the air that was put into your GI tract during the procedure and reduce the bloating. If you had a lower endoscopy (such as a colonoscopy or flexible sigmoidoscopy) you may notice spotting of blood in your stool or on the toilet paper. If you underwent a bowel prep for your procedure, you may not have a normal bowel movement for a few days.  Please Note:  You might notice some irritation and congestion in your nose or some drainage.  This is from the oxygen used during your procedure.  There is no need for concern and it should clear up in a day or so.  SYMPTOMS TO REPORT IMMEDIATELY:   Following lower endoscopy (colonoscopy or flexible sigmoidoscopy):  Excessive amounts of blood in the stool  Significant tenderness or worsening of abdominal pains  Swelling of the abdomen that is new, acute  Fever of 100F or higher  For urgent or emergent issues, a gastroenterologist can be reached at any hour by calling [phone]. Do not use MyChart messaging for urgent concerns.    DIET:  We do recommend a small meal at first, but then you may proceed to your regular diet.  Drink plenty of fluids but you should avoid alcoholic beverages for 24 hours.  ACTIVITY:  You should plan to take it easy for the rest of today and you should NOT DRIVE or use heavy machinery until tomorrow (because  of the sedation medicines used during the test).    FOLLOW UP: Our staff will call the number listed on your records 48-72 hours following your procedure to check on you and address any questions or concerns that you may have regarding the information given to you following your procedure. If we do not reach you, we will leave a message.  We will attempt to reach you two times.  During this call, we will ask if you have developed any symptoms of COVID 19. If you develop any symptoms (ie: fever, flu-like symptoms, shortness of breath, cough etc.) before then, please call [phone].  If you test positive for Covid 19 in the 2 weeks post procedure, please call and report this information to us.    If any biopsies were taken you will be contacted by phone or by letter within the next 1-3 weeks.  Please call us at [phone] if you have not heard about the biopsies in 3 weeks.    SIGNATURES/CONFIDENTIALITY: You and/or your care partner have signed paperwork which will be entered into your electronic medical record.  These signatures attest to the fact that that the information above on your After Visit Summary has been reviewed and is understood.  Full responsibility of the confidentiality of this discharge information lies with you and/or your care-partner. 

## 2019-08-21 NOTE — Progress Notes (Signed)
Report given to PACU, vss 

## 2019-08-21 NOTE — Progress Notes (Signed)
Pt's states no medical or surgical changes since previsit or office visit.  VS [place] 

## 2019-08-21 NOTE — Op Note (Signed)
[facility] Patient Name: Bradley Novak Procedure Date: 08/21/2019 10:36 AM MRN: [id number] Endoscopist: Jackquline Denmark , MD Age: 69 Referring MD:  Date of Birth: 1950-03-04 Gender: Male Account #: [id number] Procedure:                Flexible Sigmoidoscopy Indications:              H/O malignant sigmoid polyp s/p endoscopic                            resection. Flexible sigmoidoscopy is being                            performed to rule out any recurrence. Medicines:                Monitored Anesthesia Care Procedure:                Pre-Anesthesia Assessment:                           - Prior to the procedure, a History and Physical                            was performed, and patient medications and                            allergies were reviewed. The patient's tolerance of                            previous anesthesia was also reviewed. The risks                            and benefits of the procedure and the sedation                            options and risks were discussed with the patient.                            All questions were answered, and informed consent                            was obtained. Prior Anticoagulants: The patient has                            taken no previous anticoagulant or antiplatelet                            agents. ASA Grade Assessment: II - A patient with                            mild systemic disease. After reviewing the risks                            and benefits, the patient was deemed in  satisfactory condition to undergo the procedure.                           After obtaining informed consent, the scope was                            passed under direct vision. The Colonoscope was                            introduced through the anus and advanced to the the                            descending colon. Stopped due to presence of stool.                            The flexible sigmoidoscopy was  accomplished without                            difficulty. The patient tolerated the procedure                            well. The quality of the bowel preparation was good. Scope In: 10:44:56 AM Scope Out: 10:50:58 AM Total Procedure Duration: 0 hours 6 minutes 2 seconds  Findings:                 A tattoo was identified at 25 cm from the anal                            verge. No residual/recurrent polyp. The area was                            carefully examined. A few small-mouthed diverticula                            were found in the sigmoid colon. Retroflexed                            examination of the rectum did reveal small internal                            hemorrhoids.                           The exam was otherwise without abnormality. Complications:            No immediate complications. Estimated Blood Loss:     Estimated blood loss: none. Impression:               -No recurrence/residual polyp.                           -Mild sigmoid diverticulosis.                           -Otherwise normal sigmoidoscopy to descending colon. Recommendation:           -  Discharge patient to home.                           - Recommend colonoscopy in 1 year for surveillance. Jackquline Denmark, MD 08/21/2019 10:55:43 AM This report has been signed electronically.

## 2019-08-25 ENCOUNTER — Telehealth: Payer: Self-pay

## 2019-08-25 NOTE — Telephone Encounter (Signed)
No answer on follow up call. 

## 2019-08-25 NOTE — Telephone Encounter (Signed)
LVM

## 2019-09-29 DIAGNOSIS — M170 Bilateral primary osteoarthritis of knee: Secondary | ICD-10-CM | POA: Diagnosis not present

## 2019-09-29 DIAGNOSIS — M1711 Unilateral primary osteoarthritis, right knee: Secondary | ICD-10-CM | POA: Diagnosis not present

## 2019-09-29 DIAGNOSIS — M25562 Pain in left knee: Secondary | ICD-10-CM | POA: Diagnosis not present

## 2019-09-29 DIAGNOSIS — M1712 Unilateral primary osteoarthritis, left knee: Secondary | ICD-10-CM | POA: Diagnosis not present

## 2019-09-29 DIAGNOSIS — M25561 Pain in right knee: Secondary | ICD-10-CM | POA: Diagnosis not present

## 2019-10-02 ENCOUNTER — Inpatient Hospital Stay: Payer: Medicare Other | Admitting: Hematology & Oncology

## 2019-10-02 ENCOUNTER — Inpatient Hospital Stay: Payer: Medicare Other

## 2019-11-02 DIAGNOSIS — Z23 Encounter for immunization: Secondary | ICD-10-CM | POA: Diagnosis not present

## 2019-11-06 ENCOUNTER — Telehealth (INDEPENDENT_AMBULATORY_CARE_PROVIDER_SITE_OTHER): Payer: Medicare Other | Admitting: Medical

## 2019-11-06 ENCOUNTER — Telehealth: Payer: Self-pay | Admitting: Medical

## 2019-11-06 VITALS — Ht 71.0 in | Wt 265.0 lb

## 2019-11-06 DIAGNOSIS — R490 Dysphonia: Secondary | ICD-10-CM

## 2019-11-06 DIAGNOSIS — J029 Acute pharyngitis, unspecified: Secondary | ICD-10-CM | POA: Diagnosis not present

## 2019-11-06 DIAGNOSIS — I25119 Atherosclerotic heart disease of native coronary artery with unspecified angina pectoris: Secondary | ICD-10-CM

## 2019-11-06 DIAGNOSIS — R059 Cough, unspecified: Secondary | ICD-10-CM | POA: Diagnosis not present

## 2019-11-06 MED ORDER — HYDROCODONE-HOMATROPINE 5-1.5 MG/5ML PO SYRP: 5.0000 mL | ORAL_SOLUTION | Freq: Three times a day (TID) | ORAL | 0 refills | Status: AC | PRN

## 2019-11-06 MED ORDER — AZITHROMYCIN 250 MG PO TABS
ORAL_TABLET | ORAL | 0 refills | Status: DC
Start: 2019-11-06 — End: 2019-11-11

## 2019-11-06 NOTE — Progress Notes (Signed)
MyChart Video Visit    Virtual Visit via Video Note   This visit type was conducted due to national recommendations for restrictions regarding the COVID-19 Pandemic (e.g. social distancing) in an effort to limit this patient's exposure and mitigate transmission in our community. This patient is at least at moderate risk for complications without adequate follow up. This format is felt to be most appropriate for this patient at this time. Physical exam was limited by quality of the video and audio technology used for the visit.   Patient location: home Provider location: office  I discussed the limitations of evaluation and management by telemedicine and the availability of in person appointments. The patient expressed understanding and agreed to proceed.  Patient: Bradley Novak   DOB: 07-May-1950   69 y.o. Male  MRN: [id number] Visit Date: 11/06/2019  Today's healthcare provider: Dorothea Ogle, PA-C   Chief Complaint  Patient presents with  . Sore Throat  . Cough   Subjective    HPI  Upper respiratory symptoms He complains of sore throat.with cough and Laryngitis. Fatigue and ashiness.  Onset of symptoms was a few days ago and worsening.He is drinking plenty of fluids.  Past history is significant for no history of pneumonia or bronchitis. Patient is not a smoker.   ---------------------------------------------------------------------------------------------------  Every year he has been getting his pneumonia and his flu shot per oncologist.  On Monday of this week 5 days ago, he got his annual pneumococal 23 and flu shot. Every time he gets the flu shot he typically gets achy for several hours. That same evening he started getting chills and achy.  By the next day felt sick.  Wife has had terrible flu and cold symptoms staring a few weeks ago..  After a few weeks of not improving, she had consult with Dr. Tomi Novak recently.   Was prescribed Zpak.  Had 2 rounds of zpak and  finally resolved, back to normal now.   He had been helping her get better.    Currently he notes hoarse voice, phlegm, has terrible sore throat, some fatigue, feels like crap.  Has had some nausea the night he had the vaccine on Monday.  No nausea since.   No diarrhea.   Has felt slight wheeze with breathing.  Lots of cough.  Has some productive cough.   Using chloraseptic spray.  No hemopathysis.  No sinus pain, no runny nose, no ear pain.   No red itchy or water eyes.   no fever, no loss of taste of smell.  No vomiting.  Using tylenol cold and flu.  Has had covid shots.  Has not had a covid test this week.   Appetite and taste is fine.  Son has rapid test.  Energy ok yesterday.  Using [employer] with no improvement.  Throat is on fire.  No other aggravating or relieving factors. No other complaint.   Medications: Outpatient Medications Prior to Visit  Medication Sig  . atorvastatin (LIPITOR) 20 MG tablet TAKE 1 TABLET BY MOUTH DAILY  . CRANBERRY PO Take 1 capsule by mouth daily.  Marland Kitchen esomeprazole (NEXIUM) 20 MG capsule Take 20 mg by mouth 2 (two) times daily.  Marland Kitchen losartan (COZAAR) 50 MG tablet Take 1 tablet (50 mg total) by mouth daily.  . Multiple Vitamins-Minerals (MULTIVITAMIN ADULTS 50+ PO) Take 1 tablet by mouth daily.  . carvedilol (COREG) 12.5 MG tablet Take 1 tablet (12.5 mg total) by mouth 2 (two) times daily.  . nitroGLYCERIN (  NITROSTAT) 0.3 MG SL tablet Place 1 tablet (0.3 mg total) under the tongue every 5 (five) minutes as needed for chest pain. (Patient not taking: Reported on 08/21/2019)   No facility-administered medications prior to visit.    Review of Systems As in subjective   Objective    Ht 5\' 11"  (1.803 m)   Wt 265 lb (120.2 kg)   BMI 36.96 kg/m    Physical Exam  Gen: Somewhat of a hoarse voice, otherwise well-appearing, coughing some  otherwise not examined in person as this was virtual consult    Assessment & Plan   Encounter Diagnoses  Name Primary?    . Cough Yes  . Hoarse   . Sore throat        We discussed his symptoms and concerns.  His son works in a lab and will bring over a Covid test to rapid swab him today for Covid.  If he test positive he will call us back and let me know right away as we can offer other treatment options.  For now, begin medication as below, rest, hydrate well, continue to monitor home pulse ox to keep oxygen above 95%.  Call back if worse or not improving.   Bradley Novak was seen today for sore throat and cough.  Diagnoses and all orders for this visit:  Cough  Hoarse  Sore throat  Other orders -     HYDROcodone-homatropine (HYCODAN) 5-1.5 MG/5ML syrup; Take 5 mLs by mouth every 8 (eight) hours as needed for up to 5 days. -     azithromycin (ZITHROMAX) 250 MG tablet; 2 tablets day 1, then 1 tablet days 2-4     I discussed the assessment and treatment plan with the patient. The patient was provided an opportunity to ask questions and all were answered. The patient agreed with the plan and demonstrated an understanding of the instructions.   The patient was advised to call back or seek an in-person evaluation if the symptoms worsen or if the condition fails to improve as anticipated.  I provided 20 minutes of non-face-to-face time during this encounter.    Bradley Ogle, PA-C [place] [phone] (phone) [phone] (fax)  [place]

## 2019-11-06 NOTE — Telephone Encounter (Signed)
Work in for appt virtual/in office?

## 2019-11-06 NOTE — Telephone Encounter (Signed)
Pt is doing a virtual this morning

## 2019-11-11 ENCOUNTER — Other Ambulatory Visit: Payer: Self-pay

## 2019-11-11 ENCOUNTER — Inpatient Hospital Stay: Payer: Medicare Other | Attending: Hematology & Oncology

## 2019-11-11 ENCOUNTER — Encounter: Payer: Self-pay | Admitting: Hematology & Oncology

## 2019-11-11 ENCOUNTER — Inpatient Hospital Stay (HOSPITAL_BASED_OUTPATIENT_CLINIC_OR_DEPARTMENT_OTHER): Payer: Medicare Other | Admitting: Hematology & Oncology

## 2019-11-11 VITALS — BP 150/71 | HR 75 | Temp 98.7°F | Resp 20 | Wt 264.1 lb

## 2019-11-11 DIAGNOSIS — Z85038 Personal history of other malignant neoplasm of large intestine: Secondary | ICD-10-CM | POA: Diagnosis not present

## 2019-11-11 DIAGNOSIS — C8102 Nodular lymphocyte predominant Hodgkin lymphoma, intrathoracic lymph nodes: Secondary | ICD-10-CM

## 2019-11-11 DIAGNOSIS — Z9221 Personal history of antineoplastic chemotherapy: Secondary | ICD-10-CM | POA: Insufficient documentation

## 2019-11-11 DIAGNOSIS — Z8572 Personal history of non-Hodgkin lymphomas: Secondary | ICD-10-CM | POA: Diagnosis not present

## 2019-11-11 DIAGNOSIS — C8190 Hodgkin lymphoma, unspecified, unspecified site: Secondary | ICD-10-CM

## 2019-11-11 DIAGNOSIS — I25119 Atherosclerotic heart disease of native coronary artery with unspecified angina pectoris: Secondary | ICD-10-CM | POA: Diagnosis not present

## 2019-11-11 LAB — CMP (CANCER CENTER ONLY)
ALT: 36 U/L (ref 0–44)
AST: 25 U/L (ref 15–41)
Albumin: 4 g/dL (ref 3.5–5.0)
Alkaline Phosphatase: 88 U/L (ref 38–126)
Anion gap: 9 (ref 5–15)
BUN: 30 mg/dL — ABNORMAL HIGH (ref 8–23)
CO2: 25 mmol/L (ref 22–32)
Calcium: 9.3 mg/dL (ref 8.9–10.3)
Chloride: 103 mmol/L (ref 98–111)
Creatinine: 1.04 mg/dL (ref 0.61–1.24)
GFR, Estimated: >60 mL/min (ref >60)
Glucose, Bld: 123 mg/dL — ABNORMAL HIGH (ref 70–99)
Potassium: 3.2 mmol/L — ABNORMAL LOW (ref 3.5–5.1)
Sodium: 137 mmol/L (ref 135–145)
Total Bilirubin: 0.6 mg/dL (ref 0.3–1.2)
Total Protein: 7.2 g/dL (ref 6.5–8.1)

## 2019-11-11 LAB — CBC WITH DIFFERENTIAL (CANCER CENTER ONLY)
Abs Immature Granulocytes: 0.04 10*3/uL (ref 0.00–0.07)
Basophils Absolute: 0.1 10*3/uL (ref 0.0–0.1)
Basophils Relative: 1 %
Eosinophils Absolute: 0.2 10*3/uL (ref 0.0–0.5)
Eosinophils Relative: 2 %
HCT: 40.7 % (ref 39.0–52.0)
Hemoglobin: 13.6 g/dL (ref 13.0–17.0)
Immature Granulocytes: 0 %
Lymphocytes Relative: 24 %
Lymphs Abs: 2.4 10*3/uL (ref 0.7–4.0)
MCH: 29.8 pg (ref 26.0–34.0)
MCHC: 33.4 g/dL (ref 30.0–36.0)
MCV: 89.3 fL (ref 80.0–100.0)
Monocytes Absolute: 0.5 10*3/uL (ref 0.1–1.0)
Monocytes Relative: 5 %
Neutro Abs: 6.5 10*3/uL (ref 1.7–7.7)
Neutrophils Relative %: 68 %
Platelet Count: 221 10*3/uL (ref 150–400)
RBC: 4.56 MIL/uL (ref 4.22–5.81)
RDW: 12.7 % (ref 11.5–15.5)
WBC Count: 9.6 10*3/uL (ref 4.0–10.5)
nRBC: 0 % (ref 0.0–0.2)

## 2019-11-11 LAB — LACTATE DEHYDROGENASE: LDH: 153 U/L (ref 98–192)

## 2019-11-11 NOTE — Progress Notes (Signed)
Hematology and Oncology Follow Up Visit  Bradley Novak [id number] 1950-09-11 69 y.o. 11/11/2019   Principle Diagnosis:   Stage III Classical Hodgkin's Lymphoma  Stage I (T1N0M0) adenocarcinoma of the sigmoid colon  Current Therapy:    S/P ABVD x 51/2 cycles -- completed in 03/2018  S/P resection of tumor in 05/2018     Interim History:  Bradley Novak is back for follow-up.  This first time seeing him.  He is followed by Dr. Maylon Peppers.  He is very interesting.  He is a 69 year old gentleman.  He presented with Hodgkin's lymphoma back in 2019.  He had stage III disease.  He was treated with ABVD for almost 6 cycles.  He completed this in March 2020.  He then had a colonoscopy as a routine follow-up.  This unfortunately showed what appeared to be a malignant polyp.  This was resected.  The pathology report ([id number]) showed an invasive adenocarcinoma that just went to the submucosa.  It was felt that the tumor was removed.  There was a 1 mm margin.  A follow-up colonoscopy did not show any evidence of recurrence.  He is due for another colonoscopy in a year.    He feels well.  He has had no problems with nausea or vomiting.  He did take him a while to get over the chemotherapy for the Hodgkin's disease.  He has had no cough or shortness of breath.  He has had no nausea or vomiting.  He has had no obvious change in bowel or bladder habits.  He has had no rashes.  There is been no leg swelling.  He has gained weight.  Hopefully, he will be able to exercise and lose a little bit of weight.  Overall, I would say his performance status is ECOG 0.  Medications:  Current Outpatient Medications:  .  atorvastatin (LIPITOR) 20 MG tablet, TAKE 1 TABLET BY MOUTH DAILY, Disp: 90 tablet, Rfl: 3 .  CRANBERRY PO, Take 1 capsule by mouth daily., Disp: , Rfl:  .  esomeprazole (NEXIUM) 20 MG capsule, Take 20 mg by mouth 2 (two) times daily., Disp: , Rfl:  .  losartan (COZAAR) 50 MG tablet, Take 1  tablet (50 mg total) by mouth daily., Disp: 90 tablet, Rfl: 3 .  Multiple Vitamins-Minerals (MULTIVITAMIN ADULTS 50+ PO), Take 1 tablet by mouth daily., Disp: , Rfl:  .  carvedilol (COREG) 12.5 MG tablet, Take 1 tablet (12.5 mg total) by mouth 2 (two) times daily., Disp: 180 tablet, Rfl: 2 .  HYDROcodone-homatropine (HYCODAN) 5-1.5 MG/5ML syrup, Take 5 mLs by mouth every 8 (eight) hours as needed for up to 5 days. (Patient not taking: Reported on 11/11/2019), Disp: 120 mL, Rfl: 0 .  nitroGLYCERIN (NITROSTAT) 0.3 MG SL tablet, Place 1 tablet (0.3 mg total) under the tongue every 5 (five) minutes as needed for chest pain. (Patient not taking: Reported on 08/21/2019), Disp: 30 tablet, Rfl: 0  Allergies: No Known Allergies  Past Medical History, Surgical history, Social history, and Family History were reviewed and updated.  Review of Systems: Review of Systems  Constitutional: Negative.   HENT:  Negative.   Eyes: Negative.   Respiratory: Negative.   Cardiovascular: Negative.   Gastrointestinal: Negative.   Endocrine: Negative.   Genitourinary: Negative.    Musculoskeletal: Negative.   Skin: Negative.   Neurological: Negative.   Hematological: Negative.   Psychiatric/Behavioral: Negative.     Physical Exam:  weight is 264 lb 1.9 oz (119.8 kg). His oral  temperature is 98.7 F (37.1 C). His blood pressure is 150/71 (abnormal) and his pulse is 75. His respiration is 20 and oxygen saturation is 97%.   Wt Readings from Last 3 Encounters:  11/11/19 264 lb 1.9 oz (119.8 kg)  11/06/19 265 lb (120.2 kg)  08/21/19 271 lb (122.9 kg)    Physical Exam Vitals reviewed.  HENT:     Head: Normocephalic and atraumatic.  Eyes:     Pupils: Pupils are equal, round, and reactive to light.  Cardiovascular:     Rate and Rhythm: Normal rate and regular rhythm.     Heart sounds: Normal heart sounds.  Pulmonary:     Effort: Pulmonary effort is normal.     Breath sounds: Normal breath sounds.   Abdominal:     General: Bowel sounds are normal.     Palpations: Abdomen is soft.  Musculoskeletal:        General: No tenderness or deformity. Normal range of motion.     Cervical back: Normal range of motion.  Lymphadenopathy:     Cervical: No cervical adenopathy.  Skin:    General: Skin is warm and dry.     Findings: No erythema or rash.  Neurological:     Mental Status: He is alert and oriented to person, place, and time.  Psychiatric:        Behavior: Behavior normal.        Thought Content: Thought content normal.        Judgment: Judgment normal.      Lab Results  Component Value Date   WBC 9.6 11/11/2019   HGB 13.6 11/11/2019   HCT 40.7 11/11/2019   MCV 89.3 11/11/2019   PLT 221 11/11/2019     Chemistry      Component Value Date/Time   NA 137 11/11/2019 0959   NA 140 02/20/2019 1534   K 3.2 (L) 11/11/2019 0959   CL 103 11/11/2019 0959   CO2 25 11/11/2019 0959   BUN 30 (H) 11/11/2019 0959   BUN 16 02/20/2019 1534   CREATININE 1.04 11/11/2019 0959   CREATININE 0.83 09/27/2014 0001      Component Value Date/Time   CALCIUM 9.3 11/11/2019 0959   ALKPHOS 88 11/11/2019 0959   AST 25 11/11/2019 0959   ALT 36 11/11/2019 0959   BILITOT 0.6 11/11/2019 0959      Impression and Plan: Bradley Novak is a very nice 69 year old white male.  He has 2 separate malignancies.  Clearly, the Hodgkin's disease was a much more aggressive malignancy. He was treated with chemotherapy.  He is getting close follow-up with gastroenterology with respect to colonoscopies.  Clinically, I do not see any problems with recurrent disease.  We will plan to get him back to see [place] in another 6 months.  I think this would be reasonable for follow-up.  I do not see any indication that we have to do scans on him right now.  Volanda Napoleon, MD 10/27/202111:27 AM

## 2019-11-13 ENCOUNTER — Other Ambulatory Visit: Payer: Self-pay | Admitting: Medical

## 2019-11-13 MED ORDER — BENZONATATE 100 MG PO CAPS: 100.0000 mg | ORAL_CAPSULE | Freq: Four times a day (QID) | ORAL | 0 refills | Status: DC | PRN

## 2019-11-13 MED ORDER — AZITHROMYCIN 250 MG PO TABS: ORAL_TABLET | ORAL | 0 refills | Status: AC

## 2019-11-25 DIAGNOSIS — Z23 Encounter for immunization: Secondary | ICD-10-CM | POA: Diagnosis not present

## 2019-12-09 ENCOUNTER — Telehealth: Payer: Self-pay | Admitting: Medical

## 2019-12-09 NOTE — Telephone Encounter (Signed)
Called pt. LM to call back to schedule apt.

## 2019-12-09 NOTE — Telephone Encounter (Signed)
He sent me an email request for medicine for ED.  Please make an appointment to discuss

## 2019-12-28 ENCOUNTER — Encounter: Payer: Self-pay | Admitting: Medical

## 2019-12-28 ENCOUNTER — Ambulatory Visit (INDEPENDENT_AMBULATORY_CARE_PROVIDER_SITE_OTHER): Payer: Medicare Other | Admitting: Medical

## 2019-12-28 ENCOUNTER — Other Ambulatory Visit: Payer: Self-pay

## 2019-12-28 VITALS — BP 164/86 | HR 58 | Ht 71.0 in | Wt 277.6 lb

## 2019-12-28 DIAGNOSIS — R5383 Other fatigue: Secondary | ICD-10-CM

## 2019-12-28 DIAGNOSIS — N529 Male erectile dysfunction, unspecified: Secondary | ICD-10-CM | POA: Diagnosis not present

## 2019-12-28 DIAGNOSIS — R635 Abnormal weight gain: Secondary | ICD-10-CM | POA: Diagnosis not present

## 2019-12-28 DIAGNOSIS — Z8572 Personal history of non-Hodgkin lymphomas: Secondary | ICD-10-CM

## 2019-12-28 DIAGNOSIS — I25119 Atherosclerotic heart disease of native coronary artery with unspecified angina pectoris: Secondary | ICD-10-CM | POA: Diagnosis not present

## 2019-12-28 DIAGNOSIS — Z8579 Personal history of other malignant neoplasms of lymphoid, hematopoietic and related tissues: Secondary | ICD-10-CM | POA: Insufficient documentation

## 2019-12-28 DIAGNOSIS — Z9221 Personal history of antineoplastic chemotherapy: Secondary | ICD-10-CM

## 2019-12-28 DIAGNOSIS — R7309 Other abnormal glucose: Secondary | ICD-10-CM | POA: Diagnosis not present

## 2019-12-28 DIAGNOSIS — R001 Bradycardia, unspecified: Secondary | ICD-10-CM | POA: Diagnosis not present

## 2019-12-28 DIAGNOSIS — R0602 Shortness of breath: Secondary | ICD-10-CM

## 2019-12-28 DIAGNOSIS — R609 Edema, unspecified: Secondary | ICD-10-CM | POA: Diagnosis not present

## 2019-12-28 DIAGNOSIS — I10 Essential (primary) hypertension: Secondary | ICD-10-CM | POA: Diagnosis not present

## 2019-12-28 NOTE — Progress Notes (Signed)
Subjective: Chief Complaint  Patient presents with  . Erectile Dysfunction    Discuss energy level    Here to discuss several concerns.  Since his lymphoma diagnosis and ultimate chemotherapy, his oncologist told him that his body and vessels will never be the same and to get used to something just not being as they were in the past.  He notes after he finished chemotherapy there was a time where he was feeling improved, better energy, and starting to get erections again somewhat that he was not having during chemotherapy at all.  He is here to discuss these concerns  1 concern is fatigue and no energy.  This is been going on for a while, months.  His main concern is erectile dysfunction.  He can get maybe 70% rigidity whereas he had no erections during chemotherapy.  But things were absolutely normal before chemotherapy.  He wonders if he can take Viagra or similar medication.  He has never taken this before.  He was not particularly interested in a vacuum erection device.  He thinks his pulse rate runs too low.  He checks his blood pressure and pulse at home and typically his pulse will run in the 40s or 50s and blood pressure tends to be in the 140/80 range.  Lately he has felt like he is retaining some fluid in the lower extremities.  No shortness of breath or chest pain.  He has noticed some increased urination anytime he drinks fluids now.  No other new complaint  Past Medical History:  Diagnosis Date  . Contact lens/glasses fitting   . GERD (gastroesophageal reflux disease)   . History of chemotherapy    1 yr chemo completed 03/2018- in remission per patient  . History of hiatal hernia   . Hodgkin's lymphoma ([place]) 09/2017  . Hyperlipidemia   . Hypertension 2006  . Obesity   . Pneumonia 09/2017  . SVT (supraventricular tachycardia) ([place])   . Wears glasses    Current Outpatient Medications on File Prior to Visit  Medication Sig Dispense Refill  . atorvastatin (LIPITOR) 20 MG  tablet TAKE 1 TABLET BY MOUTH DAILY 90 tablet 3  . CRANBERRY PO Take 1 capsule by mouth daily.    Marland Kitchen esomeprazole (NEXIUM) 20 MG capsule Take 20 mg by mouth 2 (two) times daily.    Marland Kitchen losartan (COZAAR) 50 MG tablet Take 1 tablet (50 mg total) by mouth daily. 90 tablet 3  . Multiple Vitamins-Minerals (MULTIVITAMIN ADULTS 50+ PO) Take 1 tablet by mouth daily.    . carvedilol (COREG) 12.5 MG tablet Take 1 tablet (12.5 mg total) by mouth 2 (two) times daily. 180 tablet 2  . nitroGLYCERIN (NITROSTAT) 0.3 MG SL tablet Place 1 tablet (0.3 mg total) under the tongue every 5 (five) minutes as needed for chest pain. (Patient not taking: No sig reported) 30 tablet 0   No current facility-administered medications on file prior to visit.   ROS as in subjective     Objective: BP (!) 164/86   Pulse (!) 58   Ht 5\' 11"  (1.803 m)   Wt 277 lb 9.6 oz (125.9 kg)   SpO2 97%   BMI 38.72 kg/m   Wt Readings from Last 3 Encounters:  12/28/19 277 lb 9.6 oz (125.9 kg)  11/11/19 264 lb 1.9 oz (119.8 kg)  11/06/19 265 lb (120.2 kg)   BP Readings from Last 3 Encounters:  12/28/19 (!) 164/86  11/11/19 (!) 150/71  08/21/19 136/70   Gen: wd,  wn,nad heart: Bradycardia but otherwise no murmurs, normal S1-S2 Lungs clear no rales no wheezes 1+ nonpitting edema in lower extremities bilaterally Pulses 2+ UE and LE Neck without JVD, nontender, no lymphadenopathy or thyromegaly    Assessment: Encounter Diagnoses  Name Primary?  . Erectile dysfunction, unspecified erectile dysfunction type Yes  . Fatigue, unspecified type   . Edema, unspecified type   . Bradycardia   . Weight gain   . Elevated glucose   . SOB (shortness of breath)   . Essential hypertension   . Coronary artery disease with angina pectoris, unspecified vessel or lesion type, unspecified whether native or transplanted heart ([place])   . History of lymphoma   . History of chemotherapy       Plan: We discussed his several symptoms.  Labs  today as below.  His testosterone has been normal in recent years, but we will recheck today  His heart rate is running in the 40-50 range.  I will communicate with his cardiologist about his symptoms today as well as his low heart rate and possible medication changes versus safety of using something like Viagra  We discussed types of erectile dysfunction treatments.  We also discussed safety of medicines like Viagra in regards to overall health and blood pressure and heart condition.  I reviewed his echocardiogram from 2019  We also discussed the fact that he does have some likely long term effects from chemotherapy  He was not agreeable to BMP lab today which I recommended we check given the edema and weight gain    Jaecob was seen today for erectile dysfunction.  Diagnoses and all orders for this visit:  Erectile dysfunction, unspecified erectile dysfunction type -     Testosterone  Fatigue, unspecified type -     TSH -     Cancel: Brain natriuretic peptide -     Testosterone -     Cancel: Brain natriuretic peptide  Edema, unspecified type -     TSH -     Cancel: Brain natriuretic peptide -     Cancel: Brain natriuretic peptide  Bradycardia -     Cancel: Brain natriuretic peptide  Weight gain -     TSH -     Cancel: Brain natriuretic peptide -     Cancel: Brain natriuretic peptide  Elevated glucose -     Hemoglobin A1c -     Basic metabolic panel  SOB (shortness of breath) -     Cancel: Brain natriuretic peptide  Essential hypertension  Coronary artery disease with angina pectoris, unspecified vessel or lesion type, unspecified whether native or transplanted heart (H[facility])  History of lymphoma  History of chemotherapy  f/u pending labs, call back

## 2019-12-29 ENCOUNTER — Other Ambulatory Visit: Payer: Self-pay | Admitting: Medical

## 2019-12-29 LAB — BASIC METABOLIC PANEL
BUN/Creatinine Ratio: 16 (ref 10–24)
BUN: 15 mg/dL (ref 8–27)
CO2: 22 mmol/L (ref 20–29)
Calcium: 9 mg/dL (ref 8.6–10.2)
Chloride: 104 mmol/L (ref 96–106)
Creatinine, Ser: 0.92 mg/dL (ref 0.76–1.27)
GFR calc Af Amer: 99 mL/min/{1.73_m2} (ref >59)
GFR calc non Af Amer: 86 mL/min/{1.73_m2} (ref >59)
Glucose: 102 mg/dL — ABNORMAL HIGH (ref 65–99)
Potassium: 4 mmol/L (ref 3.5–5.2)
Sodium: 139 mmol/L (ref 134–144)

## 2019-12-29 LAB — TESTOSTERONE: Testosterone: 387 ng/dL (ref 264–916)

## 2019-12-29 LAB — HEMOGLOBIN A1C
Est. average glucose Bld gHb Est-mCnc: 111 mg/dL
Hgb A1c MFr Bld: 5.5 % (ref 4.8–5.6)

## 2019-12-29 LAB — TSH: TSH: 2.2 u[IU]/mL (ref 0.450–4.500)

## 2019-12-29 MED ORDER — CARVEDILOL 6.25 MG PO TABS: 6.2500 mg | ORAL_TABLET | Freq: Two times a day (BID) | ORAL | 1 refills | Status: DC

## 2019-12-29 MED ORDER — LOSARTAN POTASSIUM-HCTZ 100-25 MG PO TABS: 1.0000 | ORAL_TABLET | Freq: Every day | ORAL | 1 refills | Status: DC

## 2019-12-30 DIAGNOSIS — M170 Bilateral primary osteoarthritis of knee: Secondary | ICD-10-CM | POA: Diagnosis not present

## 2020-01-01 DIAGNOSIS — D485 Neoplasm of uncertain behavior of skin: Secondary | ICD-10-CM | POA: Diagnosis not present

## 2020-01-04 ENCOUNTER — Other Ambulatory Visit: Payer: Self-pay | Admitting: Medical

## 2020-01-10 IMAGING — CT CT ABD-PELV W/ CM
4 of 9 series · 15 of 36 positions shown, 17 images · IV contrast (omnipaque)
Comparison: 01/10/2018 most recent CTs of 10/07/2017.

CLINICAL DATA: Hodgkin lymphoma.  Status post therapy.

EXAM:
CT CHEST, ABDOMEN, AND PELVIS WITH CONTRAST
TECHNIQUE: Multidetector CT imaging of the chest, abdomen and pelvis was
performed following the standard protocol during bolus
administration of intravenous contrast.
CONTRAST:  100mL OMNIPAQUE IOHEXOL 300 MG/ML  SOLN

[Series 2: cap with 2 · axial · 0.98mm/px · z∈[-787,-292]mm · 5 of 149 slices shown, 7 images]
[im 25/149  mediastinal]
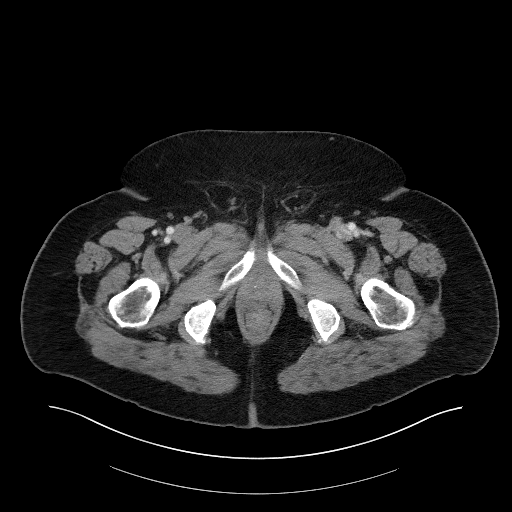
[im 25/149  lung]
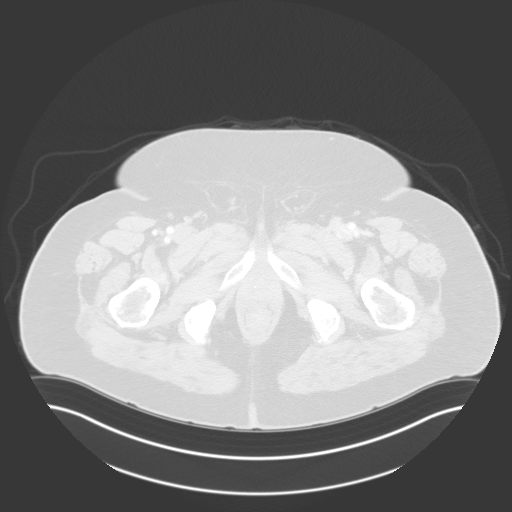
[im 50/149  lung]
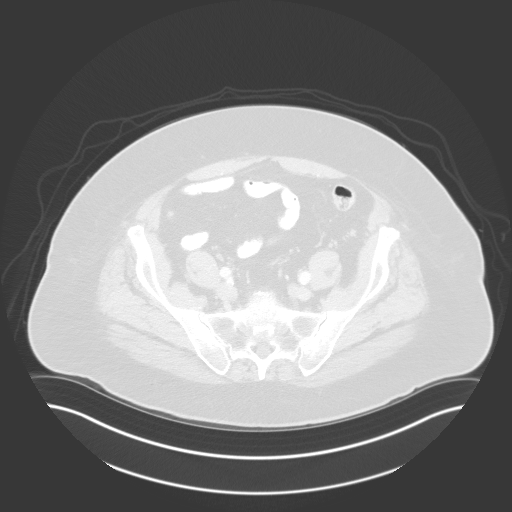
[im 75/149  lung]
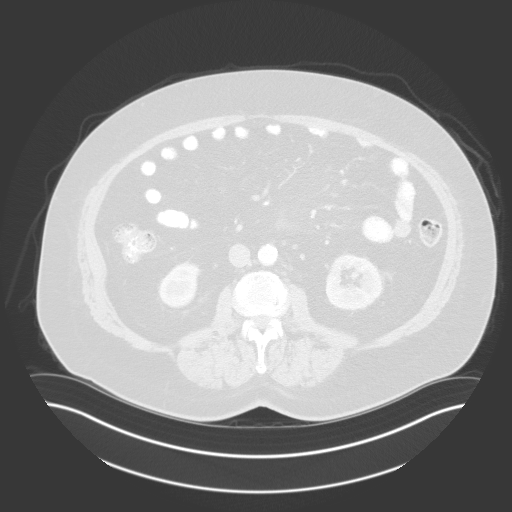
[im 99/149  lung]
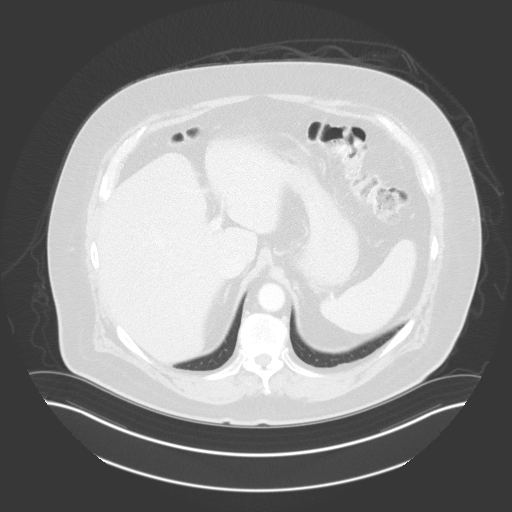
[im 124/149  mediastinal]
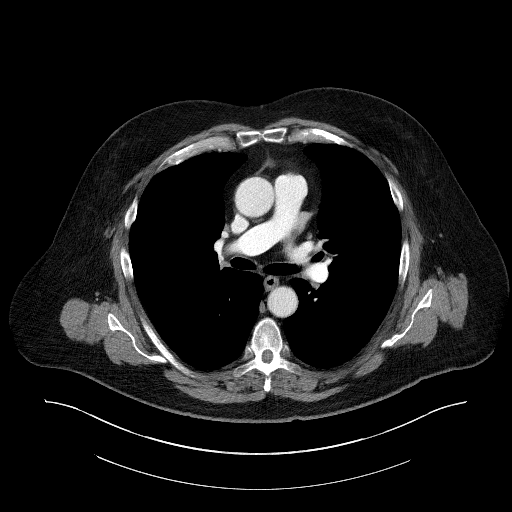
[im 124/149  lung]
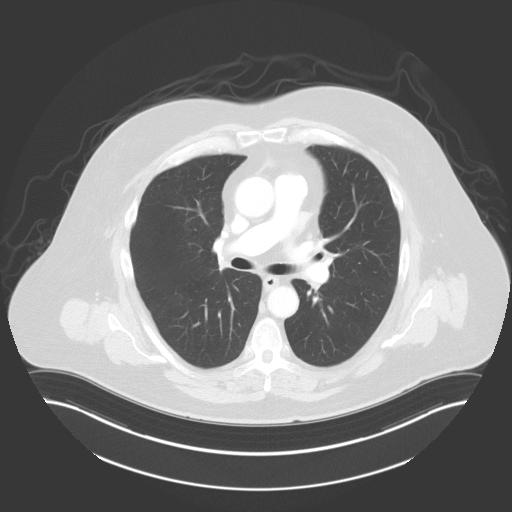

[Series 4: lung · axial · 0.98mm/px · z∈[-441,-223]mm · 5 of 165 slices shown]
[im 28/165  lung]
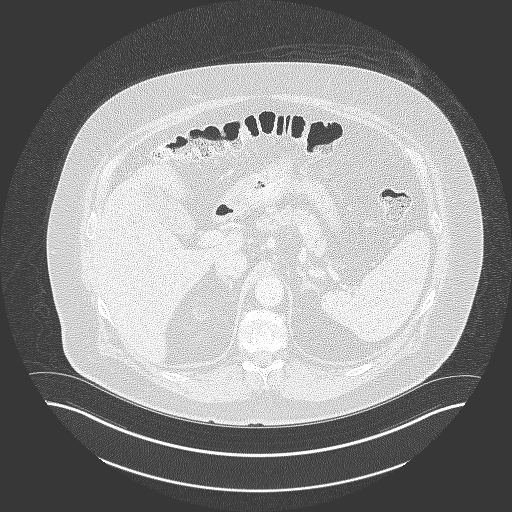
[im 55/165  lung]
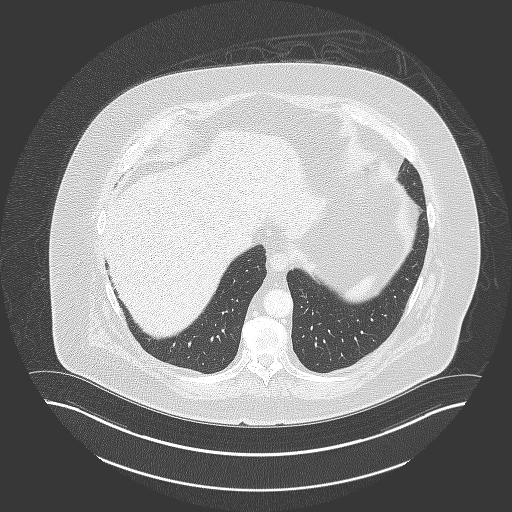
[im 83/165  lung]
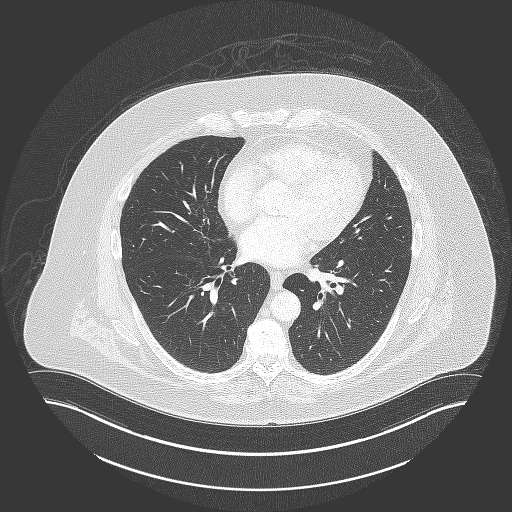
[im 110/165  lung]
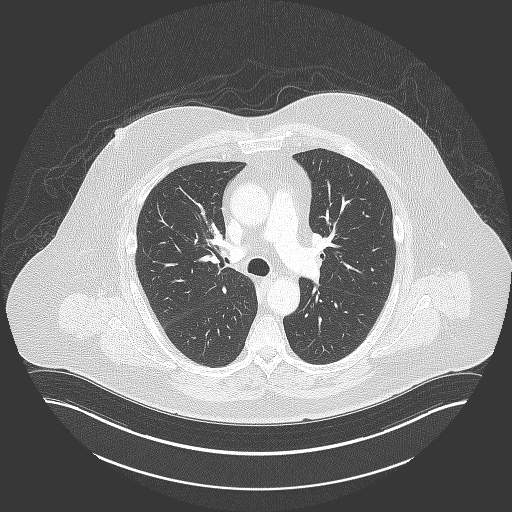
[im 137/165  lung]
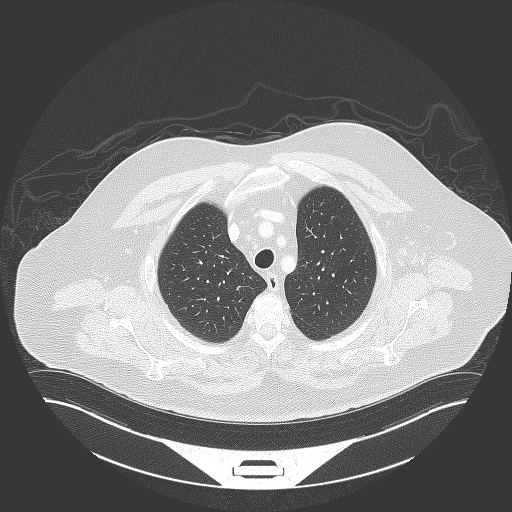

[Series 7: axial neck · axial · 0.48mm/px · z∈[-229,-55]mm · 4 of 146 slices shown]
[im 30/146  lung]
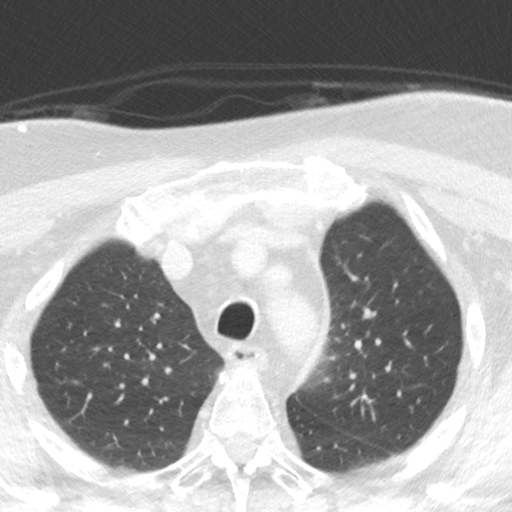
[im 59/146  lung]
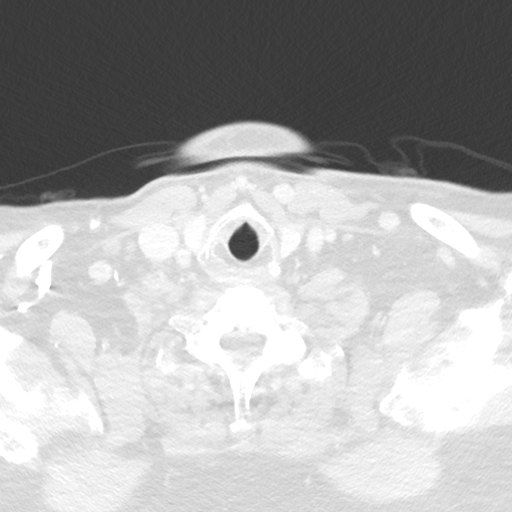
[im 88/146  lung]
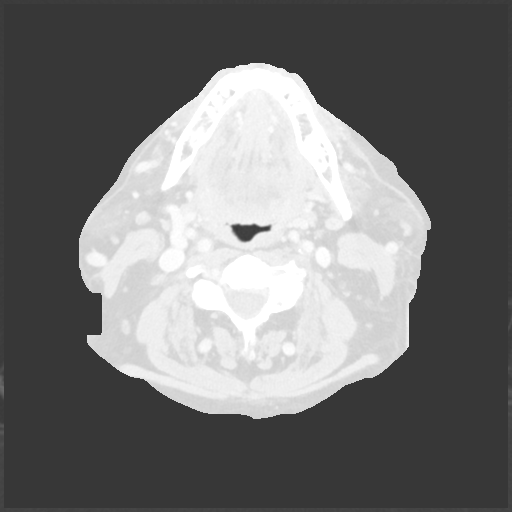
[im 117/146  lung]
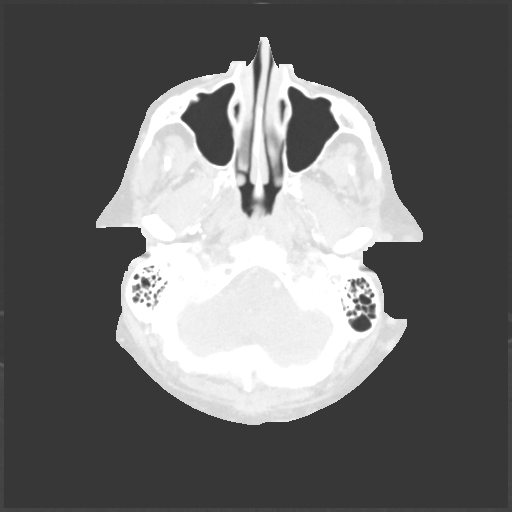

[Series 10: cor neck · coronal · 0.40mm/px · 1 of 144 slices shown]
[im 72/144  lung]
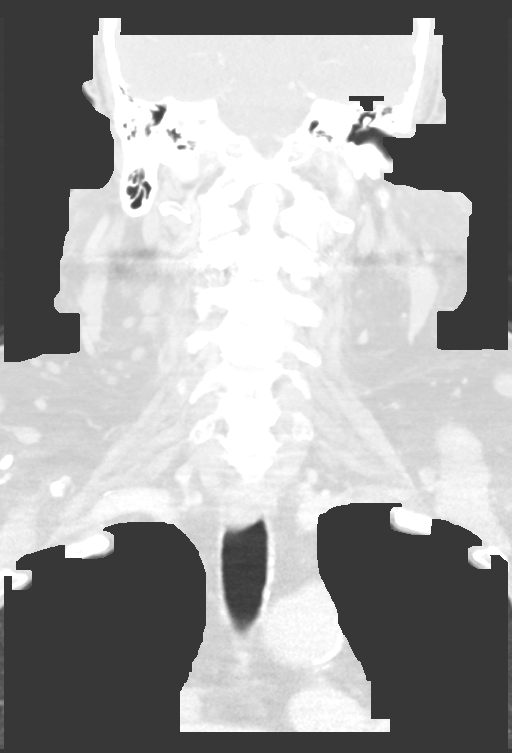

[15 of 36 positions shown; findings below may reference images not displayed]

FINDINGS: CT CHEST FINDINGS

Cardiovascular: Aortic atherosclerosis. Normal heart size, without
pericardial effusion. Multivessel coronary artery atherosclerosis.
No central pulmonary embolism, on this non-dedicated study.

Mediastinum/Nodes: Multiple small left axillary nodes. None are
pathologic by size criteria. The largest measures 9 mm on [DATE] and
is felt to have enlarged compared to the prior PET. No mediastinal
or hilar adenopathy.

Lungs/Pleura: No pleural fluid.  Clear lungs.

Musculoskeletal: No acute osseous abnormality. Sixth right rib
sclerotic lesion is likely a bone island and is similar.

CT ABDOMEN PELVIS FINDINGS

Hepatobiliary: Normal liver. Normal gallbladder, without biliary
ductal dilatation.

Pancreas: Normal, without mass or ductal dilatation.

Spleen: Normal in size, without focal abnormality.

Adrenals/Urinary Tract: Normal adrenal glands. Normal kidneys,
without hydronephrosis. Normal urinary bladder.

Stomach/Bowel: Normal stomach, without wall thickening. Normal colon
and terminal ileum. Normal small bowel.

Vascular/Lymphatic: Aortic and branch vessel atherosclerosis. No
abdominopelvic adenopathy.

Reproductive: Mild prostatomegaly.

Other: No significant free fluid. Tiny bilateral fat containing
inguinal hernias.

Musculoskeletal: No acute osseous abnormality.
IMPRESSION: 1. No evidence of adenopathy to suggest recurrent lymphoma.
2. Left axillary nodes are not pathologic by size criteria. These
may have enlarged minimally compared to the prior PET. Recommend
attention on follow-up.
3. Coronary artery atherosclerosis. Aortic Atherosclerosis
([id number]).

## 2020-01-12 ENCOUNTER — Other Ambulatory Visit: Payer: Self-pay | Admitting: Medical

## 2020-01-12 MED ORDER — SILDENAFIL CITRATE 100 MG PO TABS: 100.0000 mg | ORAL_TABLET | ORAL | 1 refills | Status: DC | PRN

## 2020-01-13 ENCOUNTER — Other Ambulatory Visit: Payer: Self-pay | Admitting: Physician Assistant

## 2020-01-13 DIAGNOSIS — R591 Generalized enlarged lymph nodes: Secondary | ICD-10-CM

## 2020-01-14 ENCOUNTER — Other Ambulatory Visit: Payer: Medicare Other

## 2020-01-18 ENCOUNTER — Other Ambulatory Visit: Payer: Self-pay

## 2020-01-18 MED ORDER — CARVEDILOL 6.25 MG PO TABS: 6.2500 mg | ORAL_TABLET | Freq: Two times a day (BID) | ORAL | 1 refills | Status: DC

## 2020-01-18 MED ORDER — LOSARTAN POTASSIUM-HCTZ 100-25 MG PO TABS: 1.0000 | ORAL_TABLET | Freq: Every day | ORAL | 1 refills | Status: DC

## 2020-01-22 DIAGNOSIS — Z03818 Encounter for observation for suspected exposure to other biological agents ruled out: Secondary | ICD-10-CM | POA: Diagnosis not present

## 2020-01-22 DIAGNOSIS — Z20822 Contact with and (suspected) exposure to covid-19: Secondary | ICD-10-CM | POA: Diagnosis not present

## 2020-03-17 DIAGNOSIS — M25562 Pain in left knee: Secondary | ICD-10-CM | POA: Diagnosis not present

## 2020-03-17 DIAGNOSIS — M25561 Pain in right knee: Secondary | ICD-10-CM | POA: Diagnosis not present

## 2020-03-22 ENCOUNTER — Other Ambulatory Visit: Payer: Self-pay

## 2020-03-22 MED ORDER — ATORVASTATIN CALCIUM 20 MG PO TABS: ORAL_TABLET | ORAL | 3 refills | Status: DC

## 2020-03-29 ENCOUNTER — Ambulatory Visit: Payer: Medicare Other | Admitting: Student

## 2020-04-03 NOTE — Progress Notes (Unsigned)
Cardiology Office Note Date:  04/06/2020  Patient ID:  Marie, Chow Nov 24, 1950, MRN [id number] PCP:  Carlena Hurl, PA-C  Cardiologist:  Dr. Acie Fredrickson Electrophysiologist: Dr. Curt Bears    Chief Complaint: *over due annual visit  History of Present Illness: Karma Hiney is a 70 y.o. male with history of Hodgkin's lymphoma, hypertension, and ORT (SVT).  He comes in today to be seen for Dr. Curt Bears, last seen by him 03/18/2019, discussed he had undergone EPS/ablation 01/29/2018  with successful ablation of an accessory pathway at CS 5 6.  Unfortunately he came back the next day with recurrent SVT. At the time of his visit was having ongoing episodes of tachycardia, fairly often, a couple a week every few weeks, longer episodes lasting about 1minutes. Seems exacerbated during treatment of his lymphoma. Also mentioned some fatigue, his coreg reduced, and planned for PRN dilt.  ASA was stopped. Not planned for repeat ablation given brief duration of his episodes.   TODAY He is still getting palpitations, much the same as when he saw Dr. Curt Bears last. In general he feels like he has a pretty good strategy with this. He has the 30mg  tabs of diltiazem, he also has some left over 120mg  and 240mg  tabs as well. When he has palpitations that persist an hour or so he will take the 60mg  and or vagal and will help. When he has a couple days in a week, he will take a 120 or a 240mg  tab daily for a couple days then settles again for a couple weeks. He does not want to pursue another ablation and as long as this is working and OK with [place], he is OK with this strategy.  A few months ago he was noting morning HR's 40's and some ankle swelling.  His PMD further reduced his coreg, added HCTZ and increased his ARB strength.  He is MOST bothered by his lack of energy and generalized fatigue. It is MUCH worse then pre-cho, but better then immediately after/during chemo, but far from what he used to  be able to do. Not overtly SOB, but fatigues easily, and is tired No CP No dizzy spells, no near syncope or syncope.  He has bad knees and is difficult to exercise with walking type activities. He reports having been checked for sleep apnea and was negative He just feels like there has been a change, and not convinced is just post chemo  Past Medical History:  Diagnosis Date  . Contact lens/glasses fitting   . GERD (gastroesophageal reflux disease)   . History of chemotherapy    1 yr chemo completed 03/2018- in remission per patient  . History of hiatal hernia   . Hodgkin's lymphoma ([place]) 09/2017  . Hyperlipidemia   . Hypertension 2006  . Obesity   . Pneumonia 09/2017  . SVT (supraventricular tachycardia) ([place])   . Wears glasses     Past Surgical History:  Procedure Laterality Date  . CARDIAC CATHETERIZATION  1980s  . IR IMAGING GUIDED PORT INSERTION  11/20/2017  . IR REMOVAL TUN ACCESS W/ PORT W/O FL MOD SED  06/02/2018  . SVT ABLATION  01/29/2018  . SVT ABLATION N/A 01/29/2018   Procedure: SVT ABLATION;  Surgeon: Constance Haw, MD;  Location: [facility];  Service: Cardiovascular;  Laterality: N/A;  . WISDOM TOOTH EXTRACTION      Current Outpatient Medications  Medication Sig Dispense Refill  . atorvastatin (LIPITOR) 20 MG tablet TAKE 1 TABLET  BY MOUTH DAILY 90 tablet 3  . carvedilol (COREG) 6.25 MG tablet Take 1 tablet (6.25 mg total) by mouth 2 (two) times daily. 180 tablet 1  . CRANBERRY PO Take 1 capsule by mouth daily.    . diclofenac (VOLTAREN) 75 MG EC tablet daily.    Marland Kitchen esomeprazole (NEXIUM) 20 MG capsule Take 20 mg by mouth 2 (two) times daily.    Marland Kitchen losartan-hydrochlorothiazide (HYZAAR) 100-25 MG tablet Take 1 tablet by mouth daily. 90 tablet 1  . Multiple Vitamins-Minerals (MULTIVITAMIN ADULTS 50+ PO) Take 1 tablet by mouth daily.    . nitroGLYCERIN (NITROSTAT) 0.3 MG SL tablet Place 1 tablet (0.3 mg total) under the tongue every 5 (five) minutes as  needed for chest pain. 30 tablet 0  . sildenafil (VIAGRA) 100 MG tablet Take 1 tablet (100 mg total) by mouth as needed for erectile dysfunction. 20 tablet 1   No current facility-administered medications for this visit.    Allergies:   Patient has no known allergies.   Social History:  The patient  reports that he quit smoking about 48 years ago. His smoking use included cigarettes. He has a 10.00 pack-year smoking history. He has never used smokeless tobacco. He reports that he does not drink alcohol and does not use drugs.   Family History:  The patient's family history includes Hypertension in his brother, brother, brother, brother, and father.  ROS:  Please see the history of present illness.    All other systems are reviewed and otherwise negative.   PHYSICAL EXAM:  VS:  BP 126/72   Pulse 66   Ht 5\' 11"  (1.803 m)   Wt 264 lb (119.7 kg)   SpO2 96%   BMI 36.82 kg/m  BMI: Body mass index is 36.82 kg/m. Well nourished, well developed, in no acute distress HEENT: normocephalic, atraumatic Neck: no JVD, carotid bruits or masses Cardiac:  RRR; no significant murmurs, no rubs, or gallops Lungs:  CTA b/l, no wheezing, rhonchi or rales Abd: soft, nontender, obese MS: no deformity or atrophy Ext: no edema Skin: warm and dry, no rash Neuro:  No gross deficits appreciated Psych: euthymic mood, full affect    EKG:  Done today and reviewed by myself shows  SR 66bpm, no changes  TTE 11/15/17  Review of the above records today demonstrates:  - Left ventricle: The cavity size was normal. Systolic function was normal. The estimated ejection fraction was in the range of 55% to 60%. Wall motion was normal; there were no regional wall motion abnormalities. Left ventricular diastolic function parameters were normal.   01/29/2018: EPS/ABlation CONCLUSIONS:  1.  SVT upon presentation.  2. The patient had orthodromic reentrant tachycardia  3. Successful radiofrequency  modification of the accessory pathway at CS 5, 6 4. No inducible arrhythmias following ablation.  5. No early apparent complications.     Recent Labs: 11/11/2019: ALT 36; Hemoglobin 13.6; Platelet Count 221 12/28/2019: BUN 15; Creatinine, Ser 0.92; Potassium 4.0; Sodium 139; TSH 2.200  No results found for requested labs within last 8760 hours.   CrCl cannot be calculated (Patient's most recent lab result is older than the maximum 21 days allowed.).   Wt Readings from Last 3 Encounters:  04/06/20 264 lb (119.7 kg)  12/28/19 277 lb 9.6 oz (125.9 kg)  11/11/19 264 lb 1.9 oz (119.8 kg)     Other studies reviewed: Additional studies/records reviewed today include: summarized above  ASSESSMENT AND PLAN:  1. SVT  ORT ablated 2020 with recurrence      He seems to have developed a working strategy Continue prn diltiazem 30mg  for acute onset of palpitations and the diltiazem CR 120mg  prn daily for increased frequency as he has been   2. HTN     Looks OK  3. Fatigue     I think mostly is deconditioning, morbid obesity, and post chemo though he is observing some slower HRs and worried about his heart Will get a one week Zio XT and update his echo  I have asked him to start to exercise in some form, stationary bike/bike pedals, low weight/mant reps type exercising  Disposition: F/u psot testing 6 weeks or so.   Current medicines are reviewed at length with the patient today.  The patient did not have any concerns regarding medicines.  Venetia Night, PA-C 04/06/2020 4:41 PM     [address] [place] [id number] [phone] (office)  [phone] (fax)

## 2020-04-06 ENCOUNTER — Other Ambulatory Visit: Payer: Self-pay

## 2020-04-06 ENCOUNTER — Ambulatory Visit (INDEPENDENT_AMBULATORY_CARE_PROVIDER_SITE_OTHER): Payer: Medicare Other

## 2020-04-06 ENCOUNTER — Encounter: Payer: Self-pay | Admitting: *Deleted

## 2020-04-06 ENCOUNTER — Ambulatory Visit (INDEPENDENT_AMBULATORY_CARE_PROVIDER_SITE_OTHER): Payer: Medicare Other | Admitting: Physician Assistant

## 2020-04-06 ENCOUNTER — Encounter: Payer: Self-pay | Admitting: Physician Assistant

## 2020-04-06 VITALS — BP 126/72 | HR 66 | Ht 71.0 in | Wt 264.0 lb

## 2020-04-06 DIAGNOSIS — R5383 Other fatigue: Secondary | ICD-10-CM

## 2020-04-06 DIAGNOSIS — R0600 Dyspnea, unspecified: Secondary | ICD-10-CM

## 2020-04-06 DIAGNOSIS — I471 Supraventricular tachycardia: Secondary | ICD-10-CM

## 2020-04-06 DIAGNOSIS — R001 Bradycardia, unspecified: Secondary | ICD-10-CM

## 2020-04-06 DIAGNOSIS — R0609 Other forms of dyspnea: Secondary | ICD-10-CM

## 2020-04-06 DIAGNOSIS — I10 Essential (primary) hypertension: Secondary | ICD-10-CM | POA: Diagnosis not present

## 2020-04-06 DIAGNOSIS — R002 Palpitations: Secondary | ICD-10-CM

## 2020-04-06 NOTE — Progress Notes (Signed)
Patient ID: Bradley Novak, male   DOB: 1950-07-01, 70 y.o.   MRN: [id number] Patient enrolled for [employer] to ship a 7 day ZIO XT long term holter monitor to her home.

## 2020-04-06 NOTE — Patient Instructions (Signed)
Medication Instructions:   Your physician recommends that you continue on your current medications as directed. Please refer to the Current Medication list given to you today.   *If you need a refill on your cardiac medications before your next appointment, please call your pharmacy*   Lab Work: [place]   If you have labs (blood work) drawn today and your tests are completely normal, you will receive your results only by: Marland Kitchen MyChart Message (if you have MyChart) OR . A paper copy in the mail If you have any lab test that is abnormal or we need to change your treatment, we will call you to review the results.   Testing/Procedures: Your physician has requested that you have an echocardiogram. Echocardiography is a painless test that uses sound waves to create images of your heart. It provides your doctor with information about the size and shape of your heart and how well your heart's chambers and valves are working. This procedure takes approximately one hour. There are no restrictions for this procedure.   Your physician has recommended that you wear an event monitor. Event monitors are medical devices that record the heart's electrical activity. Doctors most often [place] these monitors to diagnose arrhythmias. Arrhythmias are problems with the speed or rhythm of the heartbeat. The monitor is a small, portable device. You can wear one while you do your normal daily activities. This is usually used to diagnose what is causing palpitations/syncope (passing out).   Follow-Up: At [facility], you and your health needs are our priority.  As part of our continuing mission to provide you with exceptional heart care, we have created designated Provider Care Teams.  These Care Teams include your primary Cardiologist (physician) and Advanced Practice Providers (APPs -  Physician Assistants and Nurse Practitioners) who all work together to provide you with the care you need, when you need  it.  We recommend signing up for the patient portal called "MyChart".  Sign up information is provided on this After Visit Summary.  MyChart is used to connect with patients for Virtual Visits (Telemedicine).  Patients are able to view lab/test results, encounter notes, upcoming appointments, etc.  Non-urgent messages can be sent to your provider as well.   To learn more about what you can do with MyChart, go to [email or web address].    Your next appointment:   3 month(s)  The format for your next appointment:   In Person  Provider:   Tommye Standard, PA-C   Other Instructions  [place] Monitor Instructions   Your physician has requested you wear your ZIO patch monitor___7____days.   This is a single patch monitor.  [employer] supplies one patch monitor per enrollment.  Additional stickers are not available.   Please do not apply patch if you will be having a Nuclear Stress Test, Echocardiogram, Cardiac CT, MRI, or Chest Xray during the time frame you would be wearing the monitor. The patch cannot be worn during these tests.  You cannot remove and re-apply the ZIO XT patch monitor.   Your ZIO patch monitor will be sent USPS Priority mail from [facility] directly to your home address. The monitor may also be mailed to a PO BOX if home delivery is not available.   It may take 3-5 days to receive your monitor after you have been enrolled.   Once you have received you monitor, please review enclosed instructions.  Your monitor has already been registered assigning a specific monitor serial #  to you.   Applying the monitor   Shave hair from upper left chest.   Hold abrader disc by orange tab.  Rub abrader in 40 strokes over left upper chest as indicated in your monitor instructions.   Clean area with 4 enclosed alcohol pads .  Use all pads to assure are is cleaned thoroughly.  Let dry.   Apply patch as indicated in monitor instructions.  Patch will be place under  collarbone on left side of chest with arrow pointing upward.   Rub patch adhesive wings for 2 minutes.Remove white label marked "1".  Remove white label marked "2".  Rub patch adhesive wings for 2 additional minutes.   While looking in a mirror, press and release button in center of patch.  A small green light will flash 3-4 times .  This will be your only indicator the monitor has been turned on.     Do not shower for the first 24 hours.  You may shower after the first 24 hours.   Press button if you feel a symptom. You will hear a small click.  Record Date, Time and Symptom in the Patient Log Book.   When you are ready to remove patch, follow instructions on last 2 pages of Patient Log Book.  Stick patch monitor onto last page of Patient Log Book.   Place Patient Log Book in [place] box.  Use locking tab on box and tape box closed securely.  The [employer] has [employer] on it.  Please place in mailbox as soon as possible.  Your physician should have your test results approximately 7 days after the monitor has been mailed back to [facility].   Call [place] at [phone] if you have questions regarding your ZIO XT patch monitor.  Call them immediately if you see an orange light blinking on your monitor.   If your monitor falls off in less than 4 days contact our Monitor department at [phone].  If your monitor becomes loose or falls off after 4 days call [employer] at [phone] for suggestions on securing your monitor.

## 2020-04-07 MED ORDER — DILTIAZEM HCL ER 120 MG PO CP12: 120.0000 mg | ORAL_CAPSULE | ORAL | 1 refills | Status: DC | PRN

## 2020-04-07 MED ORDER — DILTIAZEM HCL 30 MG PO TABS: 30.0000 mg | ORAL_TABLET | Freq: Two times a day (BID) | ORAL | 1 refills | Status: AC | PRN

## 2020-04-07 NOTE — Addendum Note (Signed)
Addended by: Claude Manges on: 04/07/2020 08:18 AM   Modules accepted: Orders

## 2020-04-11 DIAGNOSIS — R001 Bradycardia, unspecified: Secondary | ICD-10-CM | POA: Diagnosis not present

## 2020-04-11 DIAGNOSIS — R002 Palpitations: Secondary | ICD-10-CM | POA: Diagnosis not present

## 2020-04-11 MED ORDER — DILTIAZEM HCL ER COATED BEADS 120 MG PO CP24: 120.0000 mg | ORAL_CAPSULE | Freq: Every day | ORAL | 3 refills | Status: DC | PRN

## 2020-04-11 MED ORDER — DILTIAZEM HCL ER 120 MG PO CP12: 120.0000 mg | ORAL_CAPSULE | ORAL | 3 refills | Status: DC | PRN

## 2020-04-11 NOTE — Addendum Note (Signed)
Addended by: Willeen Cass A on: 04/11/2020 11:40 AM   Modules accepted: Orders

## 2020-04-12 ENCOUNTER — Other Ambulatory Visit: Payer: Self-pay | Admitting: Medical

## 2020-04-15 ENCOUNTER — Telehealth: Payer: Self-pay | Admitting: Medical

## 2020-04-15 ENCOUNTER — Other Ambulatory Visit: Payer: Self-pay | Admitting: Medical

## 2020-04-15 MED ORDER — DILTIAZEM HCL ER COATED BEADS 120 MG PO CP24: 120.0000 mg | ORAL_CAPSULE | Freq: Every day | ORAL | 3 refills | Status: AC | PRN

## 2020-04-15 MED ORDER — TADALAFIL 20 MG PO TABS: 20.0000 mg | ORAL_TABLET | Freq: Every day | ORAL | 1 refills | Status: DC | PRN

## 2020-04-15 NOTE — Telephone Encounter (Signed)
I sent the Cialis as requested (see my chart message)  Please get him on the schedule for Medicare wellness visit

## 2020-04-15 NOTE — Telephone Encounter (Signed)
Pt informed and stated he will call back and schedule wellness appt

## 2020-04-15 NOTE — Telephone Encounter (Signed)
Spoke to pt verbally about this Medical illustrator. Aware I will resend Rx and then call to confirm receipt and cost. Pt appreciates the help on this.

## 2020-04-22 DIAGNOSIS — R002 Palpitations: Secondary | ICD-10-CM | POA: Diagnosis not present

## 2020-04-22 DIAGNOSIS — R001 Bradycardia, unspecified: Secondary | ICD-10-CM | POA: Diagnosis not present

## 2020-05-09 ENCOUNTER — Ambulatory Visit (HOSPITAL_COMMUNITY): Payer: Medicare Other | Attending: Cardiology

## 2020-05-09 ENCOUNTER — Other Ambulatory Visit: Payer: Self-pay

## 2020-05-09 DIAGNOSIS — R0600 Dyspnea, unspecified: Secondary | ICD-10-CM | POA: Insufficient documentation

## 2020-05-09 DIAGNOSIS — I10 Essential (primary) hypertension: Secondary | ICD-10-CM | POA: Diagnosis not present

## 2020-05-09 DIAGNOSIS — R0609 Other forms of dyspnea: Secondary | ICD-10-CM

## 2020-05-09 LAB — ECHOCARDIOGRAM COMPLETE
Area-P 1/2: 3.27 cm2
S' Lateral: 3.4 cm

## 2020-05-09 MED ORDER — PERFLUTREN LIPID MICROSPHERE
3.0000 mL | INTRAVENOUS | Status: AC | PRN
Start: 2020-05-09 — End: 2020-05-09
  Administered 2020-05-09: 3 mL via INTRAVENOUS

## 2020-05-11 ENCOUNTER — Other Ambulatory Visit: Payer: Self-pay

## 2020-05-11 ENCOUNTER — Inpatient Hospital Stay (HOSPITAL_BASED_OUTPATIENT_CLINIC_OR_DEPARTMENT_OTHER): Payer: Medicare Other | Admitting: Hematology & Oncology

## 2020-05-11 ENCOUNTER — Telehealth: Payer: Self-pay

## 2020-05-11 ENCOUNTER — Encounter: Payer: Self-pay | Admitting: Hematology & Oncology

## 2020-05-11 ENCOUNTER — Encounter: Payer: Self-pay | Admitting: *Deleted

## 2020-05-11 ENCOUNTER — Inpatient Hospital Stay: Payer: Medicare Other | Attending: Hematology & Oncology

## 2020-05-11 VITALS — BP 156/85 | HR 61 | Temp 98.1°F | Resp 16 | Wt 268.0 lb

## 2020-05-11 DIAGNOSIS — Z85038 Personal history of other malignant neoplasm of large intestine: Secondary | ICD-10-CM | POA: Diagnosis not present

## 2020-05-11 DIAGNOSIS — R5383 Other fatigue: Secondary | ICD-10-CM | POA: Insufficient documentation

## 2020-05-11 DIAGNOSIS — Z79899 Other long term (current) drug therapy: Secondary | ICD-10-CM | POA: Insufficient documentation

## 2020-05-11 DIAGNOSIS — C9191 Lymphoid leukemia, unspecified, in remission: Secondary | ICD-10-CM | POA: Insufficient documentation

## 2020-05-11 DIAGNOSIS — C8104 Nodular lymphocyte predominant Hodgkin lymphoma, lymph nodes of axilla and upper limb: Secondary | ICD-10-CM | POA: Diagnosis not present

## 2020-05-11 DIAGNOSIS — Z9221 Personal history of antineoplastic chemotherapy: Secondary | ICD-10-CM | POA: Diagnosis not present

## 2020-05-11 DIAGNOSIS — C8102 Nodular lymphocyte predominant Hodgkin lymphoma, intrathoracic lymph nodes: Secondary | ICD-10-CM

## 2020-05-11 LAB — CBC WITH DIFFERENTIAL (CANCER CENTER ONLY)
Abs Immature Granulocytes: 0.04 10*3/uL (ref 0.00–0.07)
Basophils Absolute: 0.1 10*3/uL (ref 0.0–0.1)
Basophils Relative: 1 %
Eosinophils Absolute: 0.1 10*3/uL (ref 0.0–0.5)
Eosinophils Relative: 1 %
HCT: 38.5 % — ABNORMAL LOW (ref 39.0–52.0)
Hemoglobin: 13.2 g/dL (ref 13.0–17.0)
Immature Granulocytes: 0 %
Lymphocytes Relative: 20 %
Lymphs Abs: 1.9 10*3/uL (ref 0.7–4.0)
MCH: 31.4 pg (ref 26.0–34.0)
MCHC: 34.3 g/dL (ref 30.0–36.0)
MCV: 91.4 fL (ref 80.0–100.0)
Monocytes Absolute: 0.6 10*3/uL (ref 0.1–1.0)
Monocytes Relative: 6 %
Neutro Abs: 7 10*3/uL (ref 1.7–7.7)
Neutrophils Relative %: 72 %
Platelet Count: 181 10*3/uL (ref 150–400)
RBC: 4.21 MIL/uL — ABNORMAL LOW (ref 4.22–5.81)
RDW: 12.9 % (ref 11.5–15.5)
WBC Count: 9.7 10*3/uL (ref 4.0–10.5)
nRBC: 0 % (ref 0.0–0.2)

## 2020-05-11 LAB — CMP (CANCER CENTER ONLY)
ALT: 22 U/L (ref 0–44)
AST: 14 U/L — ABNORMAL LOW (ref 15–41)
Albumin: 3.9 g/dL (ref 3.5–5.0)
Alkaline Phosphatase: 68 U/L (ref 38–126)
Anion gap: 7 (ref 5–15)
BUN: 25 mg/dL — ABNORMAL HIGH (ref 8–23)
CO2: 29 mmol/L (ref 22–32)
Calcium: 9.5 mg/dL (ref 8.9–10.3)
Chloride: 102 mmol/L (ref 98–111)
Creatinine: 1.23 mg/dL (ref 0.61–1.24)
GFR, Estimated: >60 mL/min (ref >60)
Glucose, Bld: 116 mg/dL — ABNORMAL HIGH (ref 70–99)
Potassium: 3.8 mmol/L (ref 3.5–5.1)
Sodium: 138 mmol/L (ref 135–145)
Total Bilirubin: 0.9 mg/dL (ref 0.3–1.2)
Total Protein: 6.9 g/dL (ref 6.5–8.1)

## 2020-05-11 NOTE — Progress Notes (Signed)
Hematology and Oncology Follow Up Visit  Bradley Novak [id number] Apr 01, 1950 70 y.o. 05/11/2020   Principle Diagnosis:   Stage III Classical Hodgkin's Lymphoma  Stage I (T1N0M0) adenocarcinoma of the sigmoid colon  Current Therapy:    S/P ABVD x 51/2 cycles -- completed in 03/2018  S/P resection of tumor in 05/2018     Interim History:  Bradley Novak is back for follow-up.  He comes in with his wife.  He is doing quite well.  They were at the beach I think over Easter.  They really enjoyed themselves down there.  He just feels tired.  He feels fatigued.  I am still not sure as to why this will be the case.  Is been 2 years since he had treatment for the Hodgkin's disease.  I know he is been evaluated close by cardiology.  He had an echocardiogram done earlier this week.  He had a very good left ventricular ejection fraction of 55-60%.  I do not see any evidence of hypothyroidism.  I would not think his testosterone level will be on the low side.  He has had no issues with cough.  There is been no issues with COVID.  Had no rashes.  He has had no leg swelling.  He has had no obvious change in bowel or bladder habits.  Overall, his performance status is ECOG 1.   Medications:  Current Outpatient Medications:  .  atorvastatin (LIPITOR) 20 MG tablet, TAKE 1 TABLET BY MOUTH DAILY, Disp: 90 tablet, Rfl: 3 .  carvedilol (COREG) 6.25 MG tablet, Take 1 tablet (6.25 mg total) by mouth 2 (two) times daily., Disp: 180 tablet, Rfl: 1 .  CRANBERRY PO, Take 1 capsule by mouth daily., Disp: , Rfl:  .  diclofenac (VOLTAREN) 75 MG EC tablet, daily., Disp: , Rfl:  .  diltiazem (CARDIZEM CD) 120 MG 24 hr capsule, Take 1 capsule (120 mg total) by mouth daily as needed., Disp: 90 capsule, Rfl: 3 .  diltiazem (CARDIZEM) 30 MG tablet, Take 1 tablet (30 mg total) by mouth 2 (two) times daily as needed (daily PRN for palpitations)., Disp: 90 tablet, Rfl: 1 .  esomeprazole (NEXIUM) 20 MG capsule, Take  20 mg by mouth 2 (two) times daily., Disp: , Rfl:  .  losartan-hydrochlorothiazide (HYZAAR) 100-25 MG tablet, Take 1 tablet by mouth daily., Disp: 90 tablet, Rfl: 1 .  Multiple Vitamins-Minerals (MULTIVITAMIN ADULTS 50+ PO), Take 1 tablet by mouth daily., Disp: , Rfl:  .  nitroGLYCERIN (NITROSTAT) 0.3 MG SL tablet, Place 1 tablet (0.3 mg total) under the tongue every 5 (five) minutes as needed for chest pain., Disp: 30 tablet, Rfl: 0 .  tadalafil (CIALIS) 20 MG tablet, Take 1 tablet (20 mg total) by mouth daily as needed for erectile dysfunction., Disp: 60 tablet, Rfl: 1  Allergies: No Known Allergies  Past Medical History, Surgical history, Social history, and Family History were reviewed and updated.  Review of Systems: Review of Systems  Constitutional: Negative.   HENT:  Negative.   Eyes: Negative.   Respiratory: Negative.   Cardiovascular: Negative.   Gastrointestinal: Negative.   Endocrine: Negative.   Genitourinary: Negative.    Musculoskeletal: Negative.   Skin: Negative.   Neurological: Negative.   Hematological: Negative.   Psychiatric/Behavioral: Negative.     Physical Exam:  weight is 268 lb (121.6 kg). His oral temperature is 98.1 F (36.7 C). His blood pressure is 156/85 (abnormal) and his pulse is 61. His respiration is 16  and oxygen saturation is 99%.   Wt Readings from Last 3 Encounters:  05/11/20 268 lb (121.6 kg)  04/06/20 264 lb (119.7 kg)  12/28/19 277 lb 9.6 oz (125.9 kg)    Physical Exam Vitals reviewed.  HENT:     Head: Normocephalic and atraumatic.  Eyes:     Pupils: Pupils are equal, round, and reactive to light.  Cardiovascular:     Rate and Rhythm: Normal rate and regular rhythm.     Heart sounds: Normal heart sounds.  Pulmonary:     Effort: Pulmonary effort is normal.     Breath sounds: Normal breath sounds.  Abdominal:     General: Bowel sounds are normal.     Palpations: Abdomen is soft.  Musculoskeletal:        General: No  tenderness or deformity. Normal range of motion.     Cervical back: Normal range of motion.  Lymphadenopathy:     Cervical: No cervical adenopathy.  Skin:    General: Skin is warm and dry.     Findings: No erythema or rash.  Neurological:     Mental Status: He is alert and oriented to person, place, and time.  Psychiatric:        Behavior: Behavior normal.        Thought Content: Thought content normal.        Judgment: Judgment normal.      Lab Results  Component Value Date   WBC 9.7 05/11/2020   HGB 13.2 05/11/2020   HCT 38.5 (L) 05/11/2020   MCV 91.4 05/11/2020   PLT 181 05/11/2020     Chemistry      Component Value Date/Time   NA 138 05/11/2020 1000   NA 139 12/28/2019 1323   K 3.8 05/11/2020 1000   CL 102 05/11/2020 1000   CO2 29 05/11/2020 1000   BUN 25 (H) 05/11/2020 1000   BUN 15 12/28/2019 1323   CREATININE 1.23 05/11/2020 1000   CREATININE 0.83 09/27/2014 0001      Component Value Date/Time   CALCIUM 9.5 05/11/2020 1000   ALKPHOS 68 05/11/2020 1000   AST 14 (L) 05/11/2020 1000   ALT 22 05/11/2020 1000   BILITOT 0.9 05/11/2020 1000      Impression and Plan: Bradley Novak is a very nice 70 year old white male.  He has 2 separate malignancies.  Clearly, the Hodgkin's disease was a much more aggressive malignancy. He was treated with chemotherapy.  Again, I am not sure exactly why he has the fatigue.  Everything looked okay with his labs.  We will follow-up with his testosterone check when we get them back.  Maybe, he is having a delayed response or delayed recovery from the chemotherapy.  He was treated quite aggressively.  I do not see any evidence of recurrence of the Hodgkin's disease.  I do not see that we have to do any scans on him.  We will still plan for follow-up in 6 months.   Bradley Napoleon, MD 4/27/202211:58 AM

## 2020-05-11 NOTE — Telephone Encounter (Signed)
appts made per 05/11/20 los and pt req to view on my chart   Bradley Novak

## 2020-06-27 ENCOUNTER — Encounter: Payer: Self-pay | Admitting: Hematology & Oncology

## 2020-06-28 ENCOUNTER — Other Ambulatory Visit: Payer: Self-pay | Admitting: Medical

## 2020-06-29 ENCOUNTER — Telehealth: Payer: Self-pay

## 2020-06-29 DIAGNOSIS — M170 Bilateral primary osteoarthritis of knee: Secondary | ICD-10-CM | POA: Diagnosis not present

## 2020-06-30 ENCOUNTER — Encounter: Payer: Self-pay | Admitting: Hematology

## 2020-06-30 ENCOUNTER — Other Ambulatory Visit: Payer: Self-pay | Admitting: Medical

## 2020-06-30 MED ORDER — POTASSIUM CHLORIDE CRYS ER 20 MEQ PO TBCR: 20.0000 meq | EXTENDED_RELEASE_TABLET | Freq: Every day | ORAL | 0 refills | Status: DC

## 2020-07-05 NOTE — Telephone Encounter (Signed)
Left pt a Vm to call the office and schedule

## 2020-07-08 ENCOUNTER — Ambulatory Visit (INDEPENDENT_AMBULATORY_CARE_PROVIDER_SITE_OTHER): Payer: Medicare Other | Admitting: Student

## 2020-07-08 ENCOUNTER — Other Ambulatory Visit: Payer: Self-pay

## 2020-07-08 ENCOUNTER — Encounter: Payer: Self-pay | Admitting: Student

## 2020-07-08 VITALS — BP 120/84 | HR 63 | Ht 71.0 in | Wt 265.2 lb

## 2020-07-08 DIAGNOSIS — I471 Supraventricular tachycardia: Secondary | ICD-10-CM | POA: Diagnosis not present

## 2020-07-08 DIAGNOSIS — R0600 Dyspnea, unspecified: Secondary | ICD-10-CM | POA: Diagnosis not present

## 2020-07-08 DIAGNOSIS — R5383 Other fatigue: Secondary | ICD-10-CM

## 2020-07-08 DIAGNOSIS — R0609 Other forms of dyspnea: Secondary | ICD-10-CM

## 2020-07-08 DIAGNOSIS — R002 Palpitations: Secondary | ICD-10-CM

## 2020-07-08 NOTE — Patient Instructions (Signed)
Medication Instructions:  Your physician recommends that you continue on your current medications as directed. Please refer to the Current Medication list given to you today.  *If you need a refill on your cardiac medications before your next appointment, please call your pharmacy*   Lab Work: None If you have labs (blood work) drawn today and your tests are completely normal, you will receive your results only by: MyChart Message (if you have MyChart) OR A paper copy in the mail If you have any lab test that is abnormal or we need to change your treatment, we will call you to review the results.   Follow-Up: At [facility], you and your health needs are our priority.  As part of our continuing mission to provide you with exceptional heart care, we have created designated Provider Care Teams.  These Care Teams include your primary Cardiologist (physician) and Advanced Practice Providers (APPs -  Physician Assistants and Nurse Practitioners) who all work together to provide you with the care you need, when you need it.   Your next appointment:   6 month(s)  The format for your next appointment:   In Person  Provider:   You may see Will Martin Camnitz, MD or one of the following Advanced Practice Providers on your designated Care Team:   Renee Ursuy, PA-C Michael "Andy" Tillery, PA-C  

## 2020-07-08 NOTE — Progress Notes (Signed)
PCP:  Carlena Hurl, PA-C Primary Cardiologist: Mertie Moores, MD Electrophysiologist: Constance Haw, MD   Bradley Novak is a 70 y.o. male seen today for Will Meredith Leeds, MD for routine electrophysiology followup.  Since last being seen in our clinic the patient reports doing overall much better from a fatigue standpoint. He attributes this to continued time away from chemo and recent addition of daily potassium supplementation. He has intermittent palpitations that he manages with as needed only diltiazem (short acting and long acting) he denies chest pain, dyspnea, PND, orthopnea, nausea, vomiting, dizziness, syncope, edema, weight gain, or early satiety.  Past Medical History:  Diagnosis Date   Contact lens/glasses fitting    GERD (gastroesophageal reflux disease)    History of chemotherapy    1 yr chemo completed 03/2018- in remission per patient   History of hiatal hernia    Hodgkin's lymphoma ([place]) 09/2017   Hyperlipidemia    Hypertension 2006   Obesity    Pneumonia 09/2017   SVT (supraventricular tachycardia) ([place])    Wears glasses    Past Surgical History:  Procedure Laterality Date   CARDIAC CATHETERIZATION  1980s   IR IMAGING GUIDED PORT INSERTION  11/20/2017   IR REMOVAL TUN ACCESS W/ PORT W/O FL MOD SED  06/02/2018   SVT ABLATION  01/29/2018   SVT ABLATION N/A 01/29/2018   Procedure: SVT ABLATION;  Surgeon: Constance Haw, MD;  Location: [facility];  Service: Cardiovascular;  Laterality: N/A;   WISDOM TOOTH EXTRACTION      Current Outpatient Medications  Medication Sig Dispense Refill   atorvastatin (LIPITOR) 20 MG tablet TAKE 1 TABLET BY MOUTH DAILY 90 tablet 3   carvedilol (COREG) 6.25 MG tablet Take 1 tablet (6.25 mg total) by mouth 2 (two) times daily. 180 tablet 1   CRANBERRY PO Take 1 capsule by mouth daily.     diclofenac (VOLTAREN) 75 MG EC tablet daily.     diltiazem (CARDIZEM CD) 120 MG 24 hr capsule Take 1 capsule (120 mg  total) by mouth daily as needed. 90 capsule 3   diltiazem (CARDIZEM) 30 MG tablet Take 1 tablet (30 mg total) by mouth 2 (two) times daily as needed (daily PRN for palpitations). 90 tablet 1   esomeprazole (NEXIUM) 20 MG capsule Take 20 mg by mouth 2 (two) times daily.     losartan-hydrochlorothiazide (HYZAAR) 100-25 MG tablet TAKE ONE TABLET BY MOUTH ONCE DAILY 90 tablet 0   nitroGLYCERIN (NITROSTAT) 0.3 MG SL tablet Place 1 tablet (0.3 mg total) under the tongue every 5 (five) minutes as needed for chest pain. 30 tablet 0   potassium chloride SA (KLOR-CON) 20 MEQ tablet Take 1 tablet (20 mEq total) by mouth daily. 90 tablet 0   tadalafil (CIALIS) 20 MG tablet Take 1 tablet (20 mg total) by mouth daily as needed for erectile dysfunction. 60 tablet 1   No current facility-administered medications for this visit.    No Known Allergies  Social History   Socioeconomic History   Marital status: Married    Spouse name: Not on file   Number of children: Not on file   Years of education: Not on file   Highest education level: Not on file  Occupational History   Not on file  Tobacco Use   Smoking status: Former    Packs/day: 2.00    Years: 5.00    Pack years: 10.00    Types: Cigarettes    Quit date:  1974    Years since quitting: 48.5   Smokeless tobacco: Never  Vaping Use   Vaping Use: Never used  Substance and Sexual Activity   Alcohol use: No   Drug use: Never   Sexual activity: Not Currently  Other Topics Concern   Not on file  Social History Narrative   Married, exercise - walks, [employer];  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, dabbles in a lot of different things, volunteers at food pantry   Social Determinants of Health   Financial Resource Strain: Not on file  Food Insecurity: Not on file  Transportation Needs: Not on file  Physical Activity: Not on file  Stress: Not on file  Social Connections: Not on file  Intimate Partner Violence: Not on file      Review of Systems: General: No chills, fever, night sweats or weight changes  Cardiovascular:  No chest pain, dyspnea on exertion, edema, orthopnea, palpitations, paroxysmal nocturnal dyspnea Dermatological: No rash, lesions or masses Respiratory: No cough, dyspnea Urologic: No hematuria, dysuria Abdominal: No nausea, vomiting, diarrhea, bright red blood per rectum, melena, or hematemesis Neurologic: No visual changes, weakness, changes in mental status All other systems reviewed and are otherwise negative except as noted above.  Physical Exam: Vitals:   07/08/20 0942  BP: 120/84  Pulse: 63  SpO2: 98%  Weight: 265 lb 3.2 oz (120.3 kg)  Height: 5\' 11"  (1.803 m)    GEN- The patient is well appearing, alert and oriented x 3 today.   HEENT: normocephalic, atraumatic; sclera clear, conjunctiva pink; hearing intact; oropharynx clear; neck supple, no JVP Lymph- no cervical lymphadenopathy Lungs- Clear to ausculation bilaterally, normal work of breathing.  No wheezes, rales, rhonchi Heart- Regular rate and rhythm, no murmurs, rubs or gallops, PMI not laterally displaced GI- soft, non-tender, non-distended, bowel sounds present, no hepatosplenomegaly Extremities- no clubbing, cyanosis, or edema; DP/PT/radial pulses 2+ bilaterally MS- no significant deformity or atrophy Skin- warm and dry, no rash or lesion Psych- euthymic mood, full affect Neuro- strength and sensation are intact  EKG is not ordered.    Assessment and Plan:  1. SVT    ORT ablated 2020 with recurrence Monitor showed symptoms associated with SVT.  He uses both short and long acting diltiazem as needed to good effect.    2. HTN Stable on current regimen today.    3. Fatigue He is currently feeling much better and overall satisfied now with energy levels.  Multifactorial. Zio and Echo unremarkable.  Echo 05/09/2020 LVEF 55-60%  Shirley Friar, [place]  07/08/20 9:49 AM

## 2020-07-11 ENCOUNTER — Ambulatory Visit: Payer: Medicare Other | Admitting: Physician Assistant

## 2020-08-25 ENCOUNTER — Other Ambulatory Visit: Payer: Self-pay | Admitting: Medical

## 2020-10-19 DIAGNOSIS — M13862 Other specified arthritis, left knee: Secondary | ICD-10-CM | POA: Diagnosis not present

## 2020-10-19 DIAGNOSIS — M13861 Other specified arthritis, right knee: Secondary | ICD-10-CM | POA: Diagnosis not present

## 2020-10-21 ENCOUNTER — Encounter: Payer: Self-pay | Admitting: Gastroenterology

## 2020-11-02 ENCOUNTER — Inpatient Hospital Stay (HOSPITAL_BASED_OUTPATIENT_CLINIC_OR_DEPARTMENT_OTHER): Payer: Medicare Other | Admitting: Hematology & Oncology

## 2020-11-02 ENCOUNTER — Other Ambulatory Visit: Payer: Self-pay

## 2020-11-02 ENCOUNTER — Encounter: Payer: Self-pay | Admitting: Hematology & Oncology

## 2020-11-02 ENCOUNTER — Inpatient Hospital Stay: Payer: Medicare Other | Attending: Hematology & Oncology

## 2020-11-02 VITALS — BP 159/76 | HR 57 | Temp 98.8°F | Resp 18 | Wt 259.0 lb

## 2020-11-02 DIAGNOSIS — Z79899 Other long term (current) drug therapy: Secondary | ICD-10-CM | POA: Diagnosis not present

## 2020-11-02 DIAGNOSIS — C8100 Nodular lymphocyte predominant Hodgkin lymphoma, unspecified site: Secondary | ICD-10-CM

## 2020-11-02 DIAGNOSIS — Z8571 Personal history of Hodgkin lymphoma: Secondary | ICD-10-CM | POA: Insufficient documentation

## 2020-11-02 DIAGNOSIS — D72829 Elevated white blood cell count, unspecified: Secondary | ICD-10-CM | POA: Diagnosis not present

## 2020-11-02 DIAGNOSIS — Z85048 Personal history of other malignant neoplasm of rectum, rectosigmoid junction, and anus: Secondary | ICD-10-CM | POA: Insufficient documentation

## 2020-11-02 DIAGNOSIS — C8104 Nodular lymphocyte predominant Hodgkin lymphoma, lymph nodes of axilla and upper limb: Secondary | ICD-10-CM

## 2020-11-02 LAB — CMP (CANCER CENTER ONLY)
ALT: 23 U/L (ref 0–44)
AST: 12 U/L — ABNORMAL LOW (ref 15–41)
Albumin: 4.2 g/dL (ref 3.5–5.0)
Alkaline Phosphatase: 70 U/L (ref 38–126)
Anion gap: 9 (ref 5–15)
BUN: 45 mg/dL — ABNORMAL HIGH (ref 8–23)
CO2: 27 mmol/L (ref 22–32)
Calcium: 9.8 mg/dL (ref 8.9–10.3)
Chloride: 101 mmol/L (ref 98–111)
Creatinine: 1.44 mg/dL — ABNORMAL HIGH (ref 0.61–1.24)
GFR, Estimated: 53 mL/min — ABNORMAL LOW (ref >60)
Glucose, Bld: 108 mg/dL — ABNORMAL HIGH (ref 70–99)
Potassium: 4.1 mmol/L (ref 3.5–5.1)
Sodium: 137 mmol/L (ref 135–145)
Total Bilirubin: 0.6 mg/dL (ref 0.3–1.2)
Total Protein: 7.4 g/dL (ref 6.5–8.1)

## 2020-11-02 LAB — LACTATE DEHYDROGENASE: LDH: 116 U/L (ref 98–192)

## 2020-11-02 NOTE — Progress Notes (Signed)
Hematology and Oncology Follow Up Visit  Bradley Novak [id number] 09/26/1950 70 y.o. 11/02/2020   Principle Diagnosis:  Stage III Classical Hodgkin's Lymphoma Stage I (T1N0M0) adenocarcinoma of the sigmoid colon  Current Therapy:   S/P ABVD x 51/2 cycles -- completed in 03/2018 S/P resection of tumor in 05/2018     Interim History:  Bradley Novak is back for follow-up.  We saw him 6 months ago.  Since then, he has been doing pretty well.  He had his 50th high school reunion recently.  He went to [employer].  He had a good time.  He had a good summer.  He and his family have been at the beach.  He does not seem to be as tired.  He says he feels pretty good.  He has had no swollen lymph nodes.  He has had no cough or shortness of breath.  There is been no issues with COVID.  He has had no change in bowel or bladder habits.  He has had no rashes.  Currently, I would have to say that his performance status is probably ECOG 1.    Medications:  Current Outpatient Medications:    atorvastatin (LIPITOR) 20 MG tablet, TAKE 1 TABLET BY MOUTH DAILY, Disp: 90 tablet, Rfl: 3   carvedilol (COREG) 6.25 MG tablet, TAKE ONE TABLET BY MOUTH TWICE DAILY, Disp: 180 tablet, Rfl: 0   CRANBERRY PO, Take 1 capsule by mouth daily., Disp: , Rfl:    diclofenac (VOLTAREN) 75 MG EC tablet, daily., Disp: , Rfl:    diltiazem (CARDIZEM CD) 120 MG 24 hr capsule, Take 1 capsule (120 mg total) by mouth daily as needed., Disp: 90 capsule, Rfl: 3   diltiazem (CARDIZEM) 30 MG tablet, Take 1 tablet (30 mg total) by mouth 2 (two) times daily as needed (daily PRN for palpitations)., Disp: 90 tablet, Rfl: 1   esomeprazole (NEXIUM) 20 MG capsule, Take 20 mg by mouth 2 (two) times daily., Disp: , Rfl:    losartan-hydrochlorothiazide (HYZAAR) 100-25 MG tablet, TAKE ONE TABLET BY MOUTH ONCE DAILY, Disp: 90 tablet, Rfl: 0   nitroGLYCERIN (NITROSTAT) 0.3 MG SL tablet, Place 1 tablet (0.3 mg total) under the  tongue every 5 (five) minutes as needed for chest pain., Disp: 30 tablet, Rfl: 0   potassium chloride SA (KLOR-CON) 20 MEQ tablet, Take 1 tablet (20 mEq total) by mouth daily., Disp: 90 tablet, Rfl: 0   tadalafil (CIALIS) 20 MG tablet, Take 1 tablet (20 mg total) by mouth daily as needed for erectile dysfunction., Disp: 60 tablet, Rfl: 1  Allergies: No Known Allergies  Past Medical History, Surgical history, Social history, and Family History were reviewed and updated.  Review of Systems: Review of Systems  Constitutional: Negative.   HENT:  Negative.    Eyes: Negative.   Respiratory: Negative.    Cardiovascular: Negative.   Gastrointestinal: Negative.   Endocrine: Negative.   Genitourinary: Negative.    Musculoskeletal: Negative.   Skin: Negative.   Neurological: Negative.   Hematological: Negative.   Psychiatric/Behavioral: Negative.     Physical Exam:  weight is 259 lb (117.5 kg). His oral temperature is 98.8 F (37.1 C). His blood pressure is 159/76 (abnormal) and his pulse is 57 (abnormal). His respiration is 18 and oxygen saturation is 99%.   Wt Readings from Last 3 Encounters:  11/02/20 259 lb (117.5 kg)  07/08/20 265 lb 3.2 oz (120.3 kg)  05/11/20 268 lb (121.6 kg)    Physical  Exam Vitals reviewed.  HENT:     Head: Normocephalic and atraumatic.  Eyes:     Pupils: Pupils are equal, round, and reactive to light.  Cardiovascular:     Rate and Rhythm: Normal rate and regular rhythm.     Heart sounds: Normal heart sounds.  Pulmonary:     Effort: Pulmonary effort is normal.     Breath sounds: Normal breath sounds.  Abdominal:     General: Bowel sounds are normal.     Palpations: Abdomen is soft.  Musculoskeletal:        General: No tenderness or deformity. Normal range of motion.     Cervical back: Normal range of motion.  Lymphadenopathy:     Cervical: No cervical adenopathy.  Skin:    General: Skin is warm and dry.     Findings: No erythema or rash.   Neurological:     Mental Status: He is alert and oriented to person, place, and time.  Psychiatric:        Behavior: Behavior normal.        Thought Content: Thought content normal.        Judgment: Judgment normal.     Lab Results  Component Value Date   WBC 17.2 (H) 11/02/2020   HGB 13.4 11/02/2020   HCT 40.4 11/02/2020   MCV 91.8 11/02/2020   PLT 233 11/02/2020     Chemistry      Component Value Date/Time   NA 137 11/02/2020 1146   NA 139 12/28/2019 1323   K 4.1 11/02/2020 1146   CL 101 11/02/2020 1146   CO2 27 11/02/2020 1146   BUN 45 (H) 11/02/2020 1146   BUN 15 12/28/2019 1323   CREATININE 1.44 (H) 11/02/2020 1146   CREATININE 0.83 09/27/2014 0001      Component Value Date/Time   CALCIUM 9.8 11/02/2020 1146   ALKPHOS 70 11/02/2020 1146   AST 12 (L) 11/02/2020 1146   ALT 23 11/02/2020 1146   BILITOT 0.6 11/02/2020 1146      Impression and Plan: Bradley Novak is a very nice 71 year old white male.  He has 2 separate malignancies.  Clearly, the Hodgkin's disease was a much more aggressive malignancy. He was treated with chemotherapy.  I am not sure why he has a leukocytosis.  I looked at his blood smear.  I do not see anything that looked suspicious.  He has had no recent steroids.  He has no obvious infection as far as I can tell.  I would like to repeat his CBC in about a month.  For right now, I will plan to see him back in 6 months.  Hopefully, the white cell count will normalize when we have checked in 1 month.    Bradley Napoleon, MD 10/19/20221:14 PM

## 2020-11-03 LAB — TSH: TSH: 3.57 u[IU]/mL (ref 0.320–4.118)

## 2020-11-03 LAB — CBC WITH DIFFERENTIAL (CANCER CENTER ONLY)
Abs Immature Granulocytes: 0.2 10*3/uL — ABNORMAL HIGH (ref 0.00–0.07)
Basophils Absolute: 0 10*3/uL (ref 0.0–0.1)
Basophils Relative: 0 %
Eosinophils Absolute: 0.2 10*3/uL (ref 0.0–0.5)
Eosinophils Relative: 1 %
HCT: 40.4 % (ref 39.0–52.0)
Hemoglobin: 13.4 g/dL (ref 13.0–17.0)
Lymphocytes Relative: 13 %
Lymphs Abs: 2.2 10*3/uL (ref 0.7–4.0)
MCH: 30.5 pg (ref 26.0–34.0)
MCHC: 33.2 g/dL (ref 30.0–36.0)
MCV: 91.8 fL (ref 80.0–100.0)
Metamyelocytes Relative: 1 %
Monocytes Absolute: 0.9 10*3/uL (ref 0.1–1.0)
Monocytes Relative: 5 %
Neutro Abs: 13.8 10*3/uL — ABNORMAL HIGH (ref 1.7–7.7)
Neutrophils Relative %: 80 %
Platelet Count: 233 10*3/uL (ref 150–400)
RBC: 4.4 MIL/uL (ref 4.22–5.81)
RDW: 13.1 % (ref 11.5–15.5)
WBC Count: 17.2 10*3/uL — ABNORMAL HIGH (ref 4.0–10.5)
nRBC: 0 % (ref 0.0–0.2)

## 2020-11-03 LAB — IGG, IGA, IGM
IgA: 253 mg/dL (ref 61–437)
IgG (Immunoglobin G), Serum: 1118 mg/dL (ref 603–1613)
IgM (Immunoglobulin M), Srm: 44 mg/dL (ref 20–172)

## 2020-11-03 LAB — TESTOSTERONE: Testosterone: 266 ng/dL (ref 264–916)

## 2020-11-08 DIAGNOSIS — Z23 Encounter for immunization: Secondary | ICD-10-CM | POA: Diagnosis not present

## 2020-11-09 ENCOUNTER — Other Ambulatory Visit: Payer: Self-pay | Admitting: Medical

## 2020-11-09 NOTE — Telephone Encounter (Signed)
I have sent a message to patient that he is overdue for an appt.

## 2020-11-10 ENCOUNTER — Other Ambulatory Visit: Payer: Medicare Other

## 2020-11-10 ENCOUNTER — Ambulatory Visit: Payer: Medicare Other | Admitting: Hematology & Oncology

## 2020-11-17 ENCOUNTER — Other Ambulatory Visit: Payer: Self-pay

## 2020-11-17 ENCOUNTER — Ambulatory Visit (INDEPENDENT_AMBULATORY_CARE_PROVIDER_SITE_OTHER): Payer: Medicare Other | Admitting: Medical

## 2020-11-17 VITALS — BP 122/80 | HR 63 | Ht 70.0 in | Wt 256.6 lb

## 2020-11-17 DIAGNOSIS — Z9221 Personal history of antineoplastic chemotherapy: Secondary | ICD-10-CM

## 2020-11-17 DIAGNOSIS — Z131 Encounter for screening for diabetes mellitus: Secondary | ICD-10-CM | POA: Diagnosis not present

## 2020-11-17 DIAGNOSIS — R972 Elevated prostate specific antigen [PSA]: Secondary | ICD-10-CM

## 2020-11-17 DIAGNOSIS — I25119 Atherosclerotic heart disease of native coronary artery with unspecified angina pectoris: Secondary | ICD-10-CM | POA: Diagnosis not present

## 2020-11-17 DIAGNOSIS — R799 Abnormal finding of blood chemistry, unspecified: Secondary | ICD-10-CM

## 2020-11-17 DIAGNOSIS — I10 Essential (primary) hypertension: Secondary | ICD-10-CM

## 2020-11-17 DIAGNOSIS — D72829 Elevated white blood cell count, unspecified: Secondary | ICD-10-CM | POA: Diagnosis not present

## 2020-11-17 DIAGNOSIS — N529 Male erectile dysfunction, unspecified: Secondary | ICD-10-CM

## 2020-11-17 DIAGNOSIS — Z8572 Personal history of non-Hodgkin lymphomas: Secondary | ICD-10-CM

## 2020-11-17 DIAGNOSIS — Z7185 Encounter for immunization safety counseling: Secondary | ICD-10-CM

## 2020-11-17 DIAGNOSIS — R7989 Other specified abnormal findings of blood chemistry: Secondary | ICD-10-CM | POA: Diagnosis not present

## 2020-11-17 DIAGNOSIS — Z8579 Personal history of other malignant neoplasms of lymphoid, hematopoietic and related tissues: Secondary | ICD-10-CM | POA: Diagnosis not present

## 2020-11-17 DIAGNOSIS — Z125 Encounter for screening for malignant neoplasm of prostate: Secondary | ICD-10-CM

## 2020-11-17 MED ORDER — TADALAFIL 20 MG PO TABS: ORAL_TABLET | ORAL | 5 refills | Status: DC

## 2020-11-17 MED ORDER — ATORVASTATIN CALCIUM 20 MG PO TABS: ORAL_TABLET | ORAL | 3 refills | Status: DC

## 2020-11-17 MED ORDER — CARVEDILOL 6.25 MG PO TABS: 6.2500 mg | ORAL_TABLET | Freq: Two times a day (BID) | ORAL | 3 refills | Status: DC

## 2020-11-17 MED ORDER — LOSARTAN POTASSIUM 100 MG PO TABS: 100.0000 mg | ORAL_TABLET | Freq: Every day | ORAL | 0 refills | Status: DC

## 2020-11-17 NOTE — Progress Notes (Signed)
Subjective:  Bradley Novak is a 70 y.o. male who presents for Chief Complaint  Patient presents with   med check    Med check - fasting , urinary issues. - having some incontinence, concerns about kidneys and white blood cells and tesosterone     Here for med check and discussion of several concerns.  Had review 2 weeks ago with oncology, cancer follow up.   Sees them q39mo.  He has questions about recent labs.  Has questions about white cells being elevated around 17,000.  He discussed this with oncologist.     Having some incontinence.   Urinary frequency, urge incontinence, since chemo, flow is reduced   He needs his medications refilled.  Before chemotherapy, testosterone had been low.   Recent Testerone low.  Currently denies significant fatigue.  He had a lot of fatigue the year after his chemotherapy but things have settled down  ED-doing fine on current medication  History of cardiac ablation.  He has been given diltiazem to use as needed by cardiology for palpitations.  He does not use this regularly  Arthritis-he was taking Diclofenac daily for knees, steroid shots q4-5 months by ortho.  However once he saw his recent kidney function he quit taking diclofenac  He is compliant with his blood pressure medication.  No chest pain, no dyspnea, no edema, no recent use of nitroglycerin.  He is aware not to use this along with his ED medication within 24 period  He is compliant with his cholesterol medication without complaint  No other aggravating or relieving factors.    No other c/o.  Past Medical History:  Diagnosis Date   Contact lens/glasses fitting    GERD (gastroesophageal reflux disease)    History of chemotherapy    1 yr chemo completed 03/2018- in remission per patient   History of hiatal hernia    Hodgkin's lymphoma ([place]) 09/2017   Hyperlipidemia    Hypertension 2006   Obesity    Pneumonia 09/2017   SVT (supraventricular tachycardia) ([facility])    Wears  glasses    Current Outpatient Medications on File Prior to Visit  Medication Sig Dispense Refill   CRANBERRY PO Take 1 capsule by mouth daily.     diltiazem (CARDIZEM CD) 120 MG 24 hr capsule Take 1 capsule (120 mg total) by mouth daily as needed. (Patient taking differently: Take 120 mg by mouth as needed.) 90 capsule 3   diltiazem (CARDIZEM) 30 MG tablet Take 1 tablet (30 mg total) by mouth 2 (two) times daily as needed (daily PRN for palpitations). 90 tablet 1   esomeprazole (NEXIUM) 20 MG capsule Take 20 mg by mouth 2 (two) times daily.     nitroGLYCERIN (NITROSTAT) 0.3 MG SL tablet Place 1 tablet (0.3 mg total) under the tongue every 5 (five) minutes as needed for chest pain. 30 tablet 0   No current facility-administered medications on file prior to visit.     The following portions of the patient's history were reviewed and updated as appropriate: allergies, current medications, past family history, past medical history, past social history, past surgical history and problem list.  ROS Otherwise as in subjective above    Objective: BP 122/80   Pulse 63   Ht 5\' 10"  (1.778 m)   Wt 256 lb 9.6 oz (116.4 kg)   BMI 36.82 kg/m   Wt Readings from Last 3 Encounters:  11/17/20 256 lb 9.6 oz (116.4 kg)  11/02/20 259 lb (117.5 kg)  07/08/20  265 lb 3.2 oz (120.3 kg)   BP Readings from Last 3 Encounters:  11/17/20 122/80  11/02/20 (!) 159/76  07/08/20 120/84    General appearance: alert, no distress, well developed, well nourished Neck: supple, no lymphadenopathy, no thyromegaly, no masses Heart: RRR, normal S1, S2, no murmurs Lungs: CTA bilaterally, no wheezes, rhonchi, or rales Abdomen: +bs, soft, non tender, non distended, no masses, no hepatomegaly, no splenomegaly Pulses: 2+ radial pulses, 2+ pedal pulses, normal cap refill Ext: no edema   Assessment: Encounter Diagnoses  Name Primary?   Leukocytosis, unspecified type Yes   Screening for diabetes mellitus     Screening for prostate cancer    Elevated prostate specific antigen (PSA)     Abnormal finding of blood chemistry, unspecified     Essential hypertension    Erectile dysfunction, unspecified erectile dysfunction type    Elevated serum creatinine    Coronary artery disease with angina pectoris, unspecified vessel or lesion type, unspecified whether native or transplanted heart ([facility]CC)    History of chemotherapy    History of lymphoma    Vaccine counseling    Low testosterone      Plan: History of lymphoma, history of cancer in remission.  Continue routine follow-up with oncology  Leukocytosis-I reviewed his recent labs.  Advised the recheck labs in 1 month for surveillance and continue recommendations from oncology  Routine PSA screen today.  His PSA marker had slight increase last time  BPH with symptoms-we discussed possible options for therapy.  He will consider and let me know.  We discussed possibly adding Flomax versus finasteride.  He liked the idea of finasteride.  Discussed risk and benefits of medication  Hypertension-continue current medication  Low testosterone --I recommend he lose weight through healthy diet and exercise and not pursue medication at this time.  We discussed risk and benefits of medication.  Erectile dysfunction-continue Cialis as needed, avoid use within 24 hours of nitroglycerin  We discussed kidney protection and elevated creatinine-he seems to be in a CKD 2 to CKD 3 range.  Advise he stop diclofenac.  We will stop the HCT portion of his losartan HCT and recheck labs in 1 month.  Stop potassium as well.  He needs to do much better with water intake  CAD-continue statin,, work on lifestyle measures and weight loss    Vaccines: Immunization History  Administered Date(s) Administered   Influenza-Unspecified 11/08/2020   PFIZER(Purple Top)SARS-COV-2 Vaccination 02/09/2019, 03/02/2019, 11/25/2019    Recommendations: Yearly flu shot Shingles  vaccine Pneumcooccal vaccines Tetanus every 10 years    Bradley Novak was seen today for med check.  Diagnoses and all orders for this visit:  Leukocytosis, unspecified type  Screening for diabetes mellitus -     Hemoglobin A1c  Screening for prostate cancer -     PSA, total and free  Elevated prostate specific antigen (PSA)  -     PSA, total and free  Abnormal finding of blood chemistry, unspecified  -     Hemoglobin A1c  Essential hypertension  Erectile dysfunction, unspecified erectile dysfunction type  Elevated serum creatinine  Coronary artery disease with angina pectoris, unspecified vessel or lesion type, unspecified whether native or transplanted heart ([place])  History of chemotherapy  History of lymphoma  Vaccine counseling  Low testosterone  Other orders -     losartan (COZAAR) 100 MG tablet; Take 1 tablet (100 mg total) by mouth daily. -     carvedilol (COREG) 6.25 MG tablet; Take 1 tablet (6.25  mg total) by mouth 2 (two) times daily. -     tadalafil (CIALIS) 20 MG tablet; TAKE ONE TABLET BY MOUTH DAILY AS NEEDED FOR ERECTILE DYSFUNCTION -     atorvastatin (LIPITOR) 20 MG tablet; TAKE 1 TABLET BY MOUTH DAILY  Spent > 45 minutes face to face with patient in discussion of symptoms, evaluation, plan and recommendations.    Follow up: pending labs

## 2020-11-18 LAB — HEMOGLOBIN A1C
Est. average glucose Bld gHb Est-mCnc: 117 mg/dL
Hgb A1c MFr Bld: 5.7 % — ABNORMAL HIGH (ref 4.8–5.6)

## 2020-11-18 LAB — PSA, TOTAL AND FREE
PSA, Free Pct: 18.2 %
PSA, Free: 0.51 ng/mL
Prostate Specific Ag, Serum: 2.8 ng/mL (ref 0.0–4.0)

## 2020-11-24 ENCOUNTER — Telehealth: Payer: Self-pay | Admitting: Internal Medicine

## 2020-11-24 NOTE — Telephone Encounter (Signed)
Called and spoke to [employer] and [place] states that pt has prenvar 10-22-2018, pneum 23 11-02-2019

## 2020-11-24 NOTE — Telephone Encounter (Signed)
Pt was notified that he did not need pneumonia.  But advised he needed tdap and shingles that he gets at pharmacy

## 2020-11-24 NOTE — Telephone Encounter (Signed)
Pt was just here the other day and looks like she is due for Pneumonia shot. Please advise which one and I will call pt and see if he can come in for pneumonia shot

## 2020-11-25 ENCOUNTER — Other Ambulatory Visit: Payer: Self-pay | Admitting: Medical

## 2020-11-25 MED ORDER — FINASTERIDE 5 MG PO TABS: 5.0000 mg | ORAL_TABLET | Freq: Every day | ORAL | 0 refills | Status: DC

## 2020-12-02 ENCOUNTER — Other Ambulatory Visit: Payer: Self-pay

## 2020-12-02 ENCOUNTER — Encounter: Payer: Self-pay | Admitting: Hematology & Oncology

## 2020-12-02 ENCOUNTER — Inpatient Hospital Stay: Payer: Medicare Other | Attending: Hematology & Oncology

## 2020-12-02 DIAGNOSIS — Z8571 Personal history of Hodgkin lymphoma: Secondary | ICD-10-CM | POA: Insufficient documentation

## 2020-12-02 DIAGNOSIS — C8100 Nodular lymphocyte predominant Hodgkin lymphoma, unspecified site: Secondary | ICD-10-CM

## 2020-12-02 LAB — CBC WITH DIFFERENTIAL (CANCER CENTER ONLY)
Abs Immature Granulocytes: 0.03 10*3/uL (ref 0.00–0.07)
Basophils Absolute: 0.1 10*3/uL (ref 0.0–0.1)
Basophils Relative: 1 %
Eosinophils Absolute: 0.1 10*3/uL (ref 0.0–0.5)
Eosinophils Relative: 1 %
HCT: 36.8 % — ABNORMAL LOW (ref 39.0–52.0)
Hemoglobin: 12.2 g/dL — ABNORMAL LOW (ref 13.0–17.0)
Immature Granulocytes: 0 %
Lymphocytes Relative: 18 %
Lymphs Abs: 1.6 10*3/uL (ref 0.7–4.0)
MCH: 30.5 pg (ref 26.0–34.0)
MCHC: 33.2 g/dL (ref 30.0–36.0)
MCV: 92 fL (ref 80.0–100.0)
Monocytes Absolute: 0.6 10*3/uL (ref 0.1–1.0)
Monocytes Relative: 7 %
Neutro Abs: 6.8 10*3/uL (ref 1.7–7.7)
Neutrophils Relative %: 73 %
Platelet Count: 203 10*3/uL (ref 150–400)
RBC: 4 MIL/uL — ABNORMAL LOW (ref 4.22–5.81)
RDW: 12.9 % (ref 11.5–15.5)
WBC Count: 9.2 10*3/uL (ref 4.0–10.5)
nRBC: 0 % (ref 0.0–0.2)

## 2020-12-07 NOTE — Telephone Encounter (Signed)
Appt made

## 2020-12-22 ENCOUNTER — Other Ambulatory Visit: Payer: Self-pay

## 2020-12-22 ENCOUNTER — Ambulatory Visit (INDEPENDENT_AMBULATORY_CARE_PROVIDER_SITE_OTHER): Payer: Medicare Other | Admitting: Medical

## 2020-12-22 ENCOUNTER — Encounter: Payer: Self-pay | Admitting: Medical

## 2020-12-22 ENCOUNTER — Telehealth: Payer: Self-pay | Admitting: Medical

## 2020-12-22 VITALS — BP 130/86 | HR 63 | Wt 253.8 lb

## 2020-12-22 DIAGNOSIS — I2583 Coronary atherosclerosis due to lipid rich plaque: Secondary | ICD-10-CM | POA: Diagnosis not present

## 2020-12-22 DIAGNOSIS — D692 Other nonthrombocytopenic purpura: Secondary | ICD-10-CM

## 2020-12-22 DIAGNOSIS — Z7185 Encounter for immunization safety counseling: Secondary | ICD-10-CM

## 2020-12-22 DIAGNOSIS — R7989 Other specified abnormal findings of blood chemistry: Secondary | ICD-10-CM | POA: Diagnosis not present

## 2020-12-22 DIAGNOSIS — Z9221 Personal history of antineoplastic chemotherapy: Secondary | ICD-10-CM | POA: Diagnosis not present

## 2020-12-22 DIAGNOSIS — I700 Atherosclerosis of aorta: Secondary | ICD-10-CM | POA: Diagnosis not present

## 2020-12-22 DIAGNOSIS — R001 Bradycardia, unspecified: Secondary | ICD-10-CM | POA: Diagnosis not present

## 2020-12-22 DIAGNOSIS — K635 Polyp of colon: Secondary | ICD-10-CM

## 2020-12-22 DIAGNOSIS — D649 Anemia, unspecified: Secondary | ICD-10-CM | POA: Insufficient documentation

## 2020-12-22 DIAGNOSIS — D539 Nutritional anemia, unspecified: Secondary | ICD-10-CM | POA: Insufficient documentation

## 2020-12-22 DIAGNOSIS — I10 Essential (primary) hypertension: Secondary | ICD-10-CM | POA: Diagnosis not present

## 2020-12-22 DIAGNOSIS — Z8579 Personal history of other malignant neoplasms of lymphoid, hematopoietic and related tissues: Secondary | ICD-10-CM | POA: Diagnosis not present

## 2020-12-22 DIAGNOSIS — I2510 Atherosclerotic heart disease of native coronary artery without angina pectoris: Secondary | ICD-10-CM | POA: Insufficient documentation

## 2020-12-22 NOTE — Telephone Encounter (Signed)
As a follow-up from today's visit I had 2 other recommendations  He is due back for colon cancer screening at this time given the concerning polyp he had last year.  He needs to go ahead and schedule follow-up for this  If he has not had a sleep study in the last 5 years, I recommend doing a home sleep study given the fact his heart rate drops below as he was discussing  If agreeable we can refer for home sleep study through [facility]

## 2020-12-22 NOTE — Progress Notes (Signed)
Subjective:  Bradley Novak is a 70 y.o. male who presents for Chief Complaint  Patient presents with   Chronic Kidney Disease    Recheck levels along with HGB and iron levels per oncology     Here for lab result questions from recent labs.  He wants to recheck on lab findings this year regarding elevated kidney marker, anemia.   He denies any bleeding or bruising.  When he and I talked in October, he stopped the HCT portion of his BP medication, he cut out diclofenac and ibuprofen  He could do better on water intake  He notes home BPs normal range.    He is compliant with medication  He notes chemotherapy was very difficult in recent years  No other aggravating or relieving factors.    No other c/o.  Past Medical History:  Diagnosis Date   Contact lens/glasses fitting    GERD (gastroesophageal reflux disease)    History of chemotherapy    1 yr chemo completed 03/2018- in remission per patient   History of hiatal hernia    Hodgkin's lymphoma ([place]) 09/2017   Hyperlipidemia    Hypertension 2006   Obesity    Pneumonia 09/2017   SVT (supraventricular tachycardia) (HCC)    Wears glasses    Current Outpatient Medications on File Prior to Visit  Medication Sig Dispense Refill   atorvastatin (LIPITOR) 20 MG tablet TAKE 1 TABLET BY MOUTH DAILY 90 tablet 3   carvedilol (COREG) 6.25 MG tablet Take 1 tablet (6.25 mg total) by mouth 2 (two) times daily. 180 tablet 3   CRANBERRY PO Take 1 capsule by mouth daily.     diltiazem (CARDIZEM CD) 120 MG 24 hr capsule Take 1 capsule (120 mg total) by mouth daily as needed. (Patient taking differently: Take 120 mg by mouth as needed.) 90 capsule 3   diltiazem (CARDIZEM) 30 MG tablet Take 1 tablet (30 mg total) by mouth 2 (two) times daily as needed (daily PRN for palpitations). 90 tablet 1   esomeprazole (NEXIUM) 20 MG capsule Take 20 mg by mouth 2 (two) times daily.     finasteride (PROSCAR) 5 MG tablet Take 1 tablet (5 mg total) by mouth  daily. 90 tablet 0   losartan (COZAAR) 100 MG tablet Take 1 tablet (100 mg total) by mouth daily. 90 tablet 0   nitroGLYCERIN (NITROSTAT) 0.3 MG SL tablet Place 1 tablet (0.3 mg total) under the tongue every 5 (five) minutes as needed for chest pain. 30 tablet 0   tadalafil (CIALIS) 20 MG tablet TAKE ONE TABLET BY MOUTH DAILY AS NEEDED FOR ERECTILE DYSFUNCTION 60 tablet 5   No current facility-administered medications on file prior to visit.     The following portions of the patient's history were reviewed and updated as appropriate: allergies, current medications, past family history, past medical history, past social history, past surgical history and problem list.  ROS Otherwise as in subjective above  Objective: BP 130/86 (BP Location: Right Arm, Patient Position: Sitting)   Pulse 63   Wt 253 lb 12.8 oz (115.1 kg)   SpO2 98%   BMI 36.42 kg/m   Wt Readings from Last 3 Encounters:  12/22/20 253 lb 12.8 oz (115.1 kg)  11/17/20 256 lb 9.6 oz (116.4 kg)  11/02/20 259 lb (117.5 kg)   BP Readings from Last 3 Encounters:  12/22/20 130/86  11/17/20 122/80  11/02/20 (!) 159/76    General appearance: alert, no distress, well developed, well nourished Bilat  forearms with a few spots of scattered purplish skin c/w senile purpura Ext: no edema  Echocardiogram 05/09/20: 1. Left ventricular ejection fraction, by estimation, is 55 to 60%. The left ventricle has normal function. The left ventricle has no regional wall motion abnormalities. There is mild left ventricular hypertrophy. Left ventricular diastolic parameters are indeterminate. 2. Right ventricular systolic function is normal. The right ventricular size is normal. Tricuspid regurgitation signal is inadequate for assessing PA pressure. 3. The mitral valve is normal in structure. No evidence of mitral valve regurgitation. No evidence of mitral stenosis. 4. The aortic valve is tricuspid. Aortic valve regurgitation is not  visualized. Mild aortic valve sclerosis is present, with no evidence of aortic valve stenosis. 5. Aortic dilatation noted. There is mild dilatation of the ascending aorta, measuring 40 mm.   Colonoscopy 05/20/19 - A 10 mm polyp was found in the cecum. The polyp was sessile. The polyp was removed with a piecemeal technique using a hot snare. Resection and retrieval were complete. To prevent bleeding post-intervention, one hemostatic clip was successfully placed (MR conditional). There was no bleeding at the end of the procedure. Findings: - A 12 mm polyp was found in the ileocecal valve. The polyp was sessile. The polyp was removed with a hot snare with piecemeal technique. Resection and retrieval were complete. To prevent bleeding post-intervention, one hemostatic clip was successfully placed (MR conditional) at the base. There was no bleeding at the end of the procedure. - A 12 mm polyp was found in the mid sigmoid colon, 25 cm from the anal verge. The polyp was semi-pedunculated. The polyp was removed with a hot snare. Resection and retrieval were complete. Area was tattooed with an injection of 1.5 mL of Spot just distal to the polypectomy site. - A few small-mouthed diverticula were found in the sigmoid colon. - Non-bleeding internal hemorrhoids were found during retroflexion. The hemorrhoids were small.   Surgical pathology 05/20/19: 1. Surgical [P], colon, cecum, polyp (2) - TUBULAR ADENOMA(S). - HIGH GRADE DYSPLASIA IS NOT IDENTIFIED. 2. Surgical [P], colon, sigmoid at 25cm, polyp - INVASIVE ADENOCARCINOMA, MODERATELY DIFFERENTIATED, SPANNING 0.6 CM. - CARCINOMA INVOLVES THE SUBMUCOSA. - CARCINOMA IS 0.1 CM TO THE RESECTION MARGIN. - SEE ONCOLOGY TABLE BELOW.    Flex sigmoidoscopy 08/21/19 Impression: -No recurrence/residual polyp. -Mild sigmoid diverticulosis. -Otherwise normal sigmoidoscopy to descending colon. Recommendation: - Discharge patient to home. - Recommend  colonoscopy in 1 year for surveillance.    FINDINGS: CT CHEST FINDINGS 03/30/19   Cardiovascular: Normal heart size. No significant pericardial effusion/thickening. Three-vessel coronary atherosclerosis. Atherosclerotic nonaneurysmal thoracic aorta. Normal caliber pulmonary arteries. No central pulmonary emboli.   Mediastinum/Nodes: No discrete thyroid nodules. Unremarkable esophagus. No pathologically enlarged axillary, mediastinal or hilar lymph nodes.   Lungs/Pleura: No pneumothorax. No pleural effusion. No acute consolidative airspace disease, lung masses or significant pulmonary nodules.   Musculoskeletal: No aggressive appearing focal osseous lesions. Mild thoracic spondylosis.   CT ABDOMEN PELVIS FINDINGS   Hepatobiliary: Normal liver with no liver mass. Normal gallbladder with no radiopaque cholelithiasis. No biliary ductal dilatation.   Pancreas: Normal, with no mass or duct dilation.   Spleen: Normal size. No mass.   Adrenals/Urinary Tract: Normal adrenals. Normal kidneys with no hydronephrosis and no renal mass. Normal bladder.   Stomach/Bowel: Normal non-distended stomach. Normal caliber small bowel with no small bowel wall thickening. Normal appendix. Oral contrast transits to the rectum. Normal large bowel with no diverticulosis, large bowel wall thickening or pericolonic fat stranding.   Vascular/Lymphatic:  Atherosclerotic nonaneurysmal abdominal aorta. Patent portal, splenic, hepatic and renal veins. No pathologically enlarged lymph nodes in the abdomen or pelvis.   Reproductive: Top-normal size prostate.   Other: No pneumoperitoneum, ascites or focal fluid collection.   Musculoskeletal: No aggressive appearing focal osseous lesions. Mild lumbar spondylosis.   IMPRESSION: 1. No lymphadenopathy or other evidence of recurrent lymphoma in the chest, abdomen or pelvis. 2. Three-vessel coronary atherosclerosis. 3. Aortic Atherosclerosis  (ICD10-I70.0).     Assessment: Encounter Diagnoses  Name Primary?   Anemia, unspecified type Yes   Elevated serum creatinine    Atherosclerosis of aorta (HCC)    Coronary artery disease due to lipid rich plaque    Essential hypertension    History of chemotherapy    History of lymphoma    Nutritional anemia, unspecified    Senile purpura (HCC)    Polyp of colon, unspecified part of colon, unspecified type    Vaccine counseling    Bradycardia      Plan: Anemia-we discussed possible causes.  We discussed anemia of chronic disease which would make a lot of sense given his underlying health issues and prior chemotherapy, hypertension and atherosclerosis.  We discussed possibly renal disease related since he does have some recent abnormal creatinine and may be experiencing some changes in kidney function.  Labs as below today including iron folate and B12 levels.  I also reviewed his colonoscopy reports from 2021 which had abnormality.  Colon polyp with malignant polyp-I reviewed back of his prior endoscopies/colonoscopies, and he is due for repeat colonoscopy at this time  Abnormal kidney function- similar to anemia above, we discussed possible causes.  Work on good hydration.  Continue good blood pressure control.   I stopped his diuretic in October.  We will recheck labs today in case that made a difference as well.  He needs to drink more water though.  He has known atherosclerosis and given his underlying history including history of cancer and chemotherapy he likely has change in kidney function related to all of these factors.  Recheck labs today  Hypertension-continue current therapy for now.  I stopped his diuretic in October given the bump in kidney function.  Recheck labs today.  Advise he follow-up with cardiology regarding the bradycardia.  Although his numbers here today were not bad, he says he wakes up in the 45 pulse range  Senile purpura-we discussed this finding,  diagnosis  Coronary artery atherosclerosis and aortic atherosclerosis-continue statin, good blood pressure control  He plans to get a COVID booster in about a month along with his wife  Consider sleep study given bradycardia and overall obesity and medical problems   Bradley Novak was seen today for chronic kidney disease.  Diagnoses and all orders for this visit:  Anemia, unspecified type -     Iron, TIBC and Ferritin Panel -     Urinalysis -     Renal Function Panel -     Vitamin B12 -     Folate  Elevated serum creatinine  Atherosclerosis of aorta (HCC) -     Lipid panel  Coronary artery disease due to lipid rich plaque -     Lipid panel  Essential hypertension -     Renal Function Panel -     Lipid panel  History of chemotherapy -     Iron, TIBC and Ferritin Panel -     Vitamin B12 -     Folate  History of lymphoma -     Iron,  TIBC and Ferritin Panel -     Vitamin B12 -     Folate  Nutritional anemia, unspecified -     Vitamin B12 -     Folate  Senile purpura (HCC)  Polyp of colon, unspecified part of colon, unspecified type  Vaccine counseling  Bradycardia   Follow up: pending labs

## 2020-12-23 ENCOUNTER — Encounter: Payer: Self-pay | Admitting: Gastroenterology

## 2020-12-23 LAB — RENAL FUNCTION PANEL
Albumin: 4.2 g/dL (ref 3.8–4.8)
BUN/Creatinine Ratio: 19 (ref 10–24)
BUN: 22 mg/dL (ref 8–27)
CO2: 24 mmol/L (ref 20–29)
Calcium: 9.3 mg/dL (ref 8.6–10.2)
Chloride: 105 mmol/L (ref 96–106)
Creatinine, Ser: 1.14 mg/dL (ref 0.76–1.27)
Glucose: 96 mg/dL (ref 70–99)
Phosphorus: 2.9 mg/dL (ref 2.8–4.1)
Potassium: 4.3 mmol/L (ref 3.5–5.2)
Sodium: 143 mmol/L (ref 134–144)
eGFR: 70 mL/min/{1.73_m2} (ref >59)

## 2020-12-23 LAB — LIPID PANEL
Chol/HDL Ratio: 3.2 ratio (ref 0.0–5.0)
Cholesterol, Total: 127 mg/dL (ref 100–199)
HDL: 40 mg/dL (ref >39)
LDL Chol Calc (NIH): 62 mg/dL (ref 0–99)
Triglycerides: 141 mg/dL (ref 0–149)
VLDL Cholesterol Cal: 25 mg/dL (ref 5–40)

## 2020-12-23 LAB — URINALYSIS
Bilirubin, UA: NEGATIVE
Glucose, UA: NEGATIVE
Ketones, UA: NEGATIVE
Nitrite, UA: NEGATIVE
Protein,UA: NEGATIVE
RBC, UA: NEGATIVE
Specific Gravity, UA: 1.018 (ref 1.005–1.030)
Urobilinogen, Ur: 0.2 mg/dL (ref 0.2–1.0)
pH, UA: 5.5 (ref 5.0–7.5)

## 2020-12-23 LAB — IRON,TIBC AND FERRITIN PANEL
Ferritin: 148 ng/mL (ref 30–400)
Iron Saturation: 35 % (ref 15–55)
Iron: 93 ug/dL (ref 38–169)
Total Iron Binding Capacity: 268 ug/dL (ref 250–450)
UIBC: 175 ug/dL (ref 111–343)

## 2020-12-23 LAB — FOLATE: Folate: 14.1 ng/mL (ref >3.0)

## 2020-12-23 LAB — VITAMIN B12: Vitamin B-12: 490 pg/mL (ref 232–1245)

## 2020-12-23 NOTE — Telephone Encounter (Signed)
Called patient and no answer or voicemail is set up

## 2020-12-28 DIAGNOSIS — M170 Bilateral primary osteoarthritis of knee: Secondary | ICD-10-CM | POA: Diagnosis not present

## 2020-12-30 NOTE — Telephone Encounter (Signed)
Spoke to patient and he states he has never had a sleep study and he isnt going to get one. Addiment that he does not have sleep apnea and this would be a waste of time. Routing for FYI purposes only

## 2021-01-04 DIAGNOSIS — M13861 Other specified arthritis, right knee: Secondary | ICD-10-CM | POA: Diagnosis not present

## 2021-01-04 DIAGNOSIS — M170 Bilateral primary osteoarthritis of knee: Secondary | ICD-10-CM | POA: Diagnosis not present

## 2021-01-04 DIAGNOSIS — M13862 Other specified arthritis, left knee: Secondary | ICD-10-CM | POA: Diagnosis not present

## 2021-01-11 DIAGNOSIS — M170 Bilateral primary osteoarthritis of knee: Secondary | ICD-10-CM | POA: Diagnosis not present

## 2021-02-03 ENCOUNTER — Other Ambulatory Visit: Payer: Self-pay | Admitting: Medical

## 2021-02-06 ENCOUNTER — Telehealth: Payer: Self-pay | Admitting: Medical

## 2021-02-06 NOTE — Telephone Encounter (Signed)
Spoke with patient to schedule Medicare Annual Wellness Visit (AWV) either virtually or in office.  Patient stated he wcb going on a trip in a couple weeks   awvi 12/15/17 per [facility]   please schedule at anytime with health coach  This should be a 45 minute visit.

## 2021-02-08 ENCOUNTER — Ambulatory Visit (AMBULATORY_SURGERY_CENTER): Payer: Medicare Other | Admitting: *Deleted

## 2021-02-08 ENCOUNTER — Other Ambulatory Visit: Payer: Self-pay

## 2021-02-08 VITALS — Ht 71.0 in | Wt 253.0 lb

## 2021-02-08 DIAGNOSIS — C801 Malignant (primary) neoplasm, unspecified: Secondary | ICD-10-CM

## 2021-02-08 DIAGNOSIS — Z86010 Personal history of colonic polyps: Secondary | ICD-10-CM

## 2021-02-08 NOTE — Progress Notes (Signed)
Patient's pre-visit was done today over the phone with the patient. Name,DOB and address verified. Patient denies any allergies to Eggs and Soy. Patient denies any problems with anesthesia/sedation. Patient is not taking any diet pills or blood thinners. No home Oxygen.   Prep instructions sent to pt's MyChart-pt aware. Patient understands to call us back with any questions or concerns. Patient is aware of our care-partner policy and [id number] safety protocol.   The patient is COVID-19 vaccinated.

## 2021-02-24 ENCOUNTER — Encounter: Payer: Medicare Other | Admitting: Gastroenterology

## 2021-02-27 ENCOUNTER — Encounter: Payer: Self-pay | Admitting: Certified Registered Nurse Anesthetist

## 2021-02-28 ENCOUNTER — Other Ambulatory Visit: Payer: Self-pay

## 2021-02-28 ENCOUNTER — Encounter: Payer: Self-pay | Admitting: Gastroenterology

## 2021-02-28 ENCOUNTER — Ambulatory Visit (AMBULATORY_SURGERY_CENTER): Payer: Medicare Other | Admitting: Gastroenterology

## 2021-02-28 VITALS — BP 147/74 | HR 51 | Temp 97.3°F | Resp 10 | Ht 71.0 in | Wt 253.0 lb

## 2021-02-28 DIAGNOSIS — D120 Benign neoplasm of cecum: Secondary | ICD-10-CM

## 2021-02-28 DIAGNOSIS — Z86010 Personal history of colonic polyps: Secondary | ICD-10-CM | POA: Diagnosis not present

## 2021-02-28 DIAGNOSIS — I10 Essential (primary) hypertension: Secondary | ICD-10-CM | POA: Diagnosis not present

## 2021-02-28 DIAGNOSIS — Z85038 Personal history of other malignant neoplasm of large intestine: Secondary | ICD-10-CM

## 2021-02-28 DIAGNOSIS — D122 Benign neoplasm of ascending colon: Secondary | ICD-10-CM

## 2021-02-28 DIAGNOSIS — C801 Malignant (primary) neoplasm, unspecified: Secondary | ICD-10-CM

## 2021-02-28 MED ORDER — SODIUM CHLORIDE 0.9 % IV SOLN: 500.0000 mL | Freq: Once | INTRAVENOUS | Status: DC

## 2021-02-28 NOTE — Op Note (Signed)
[facility] Patient Name: Bradley Novak Procedure Date: 02/28/2021 8:37 AM MRN: [id number] Endoscopist: Jackquline Denmark , MD Age: 71 Referring MD:  Date of Birth: [birth year] Gender: Male Account #: [id number] Procedure:                Colonoscopy Indications:              High risk colon cancer surveillance: H/O malignant                            sigmoid polyp s/p endoscopic resection 05/2019 (mod                            diff, no lymphovascular invasion, 1 mm margins).                            History of advanced colon polyps. Medicines:                Monitored Anesthesia Care Procedure:                Pre-Anesthesia Assessment:                           - Prior to the procedure, a History and Physical                            was performed, and patient medications and                            allergies were reviewed. The patient's tolerance of                            previous anesthesia was also reviewed. The risks                            and benefits of the procedure and the sedation                            options and risks were discussed with the patient.                            All questions were answered, and informed consent                            was obtained. Prior Anticoagulants: The patient has                            taken no previous anticoagulant or antiplatelet                            agents. ASA Grade Assessment: II - A patient with                            mild systemic disease. After reviewing the risks  and benefits, the patient was deemed in                            satisfactory condition to undergo the procedure.                           After obtaining informed consent, the colonoscope                            was passed under direct vision. Throughout the                            procedure, the patient's blood pressure, pulse, and                            oxygen saturations were  monitored continuously. The                            Colonoscope was introduced through the anus and                            advanced to the 2 cm into the ileum. The                            colonoscopy was performed without difficulty. The                            patient tolerated the procedure well. The quality                            of the bowel preparation was good. The terminal                            ileum, ileocecal valve, appendiceal orifice, and                            rectum were photographed. Scope In: 8:46:05 AM Scope Out: 8:59:19 AM Scope Withdrawal Time: 0 hours 9 minutes 44 seconds  Total Procedure Duration: 0 hours 13 minutes 14 seconds  Findings:                 Two sessile polyps were found in the mid ascending                            colon and cecum. The polyps were 2 to 6 mm in size.                            These polyps were removed with a cold snare.                            Resection and retrieval were complete.                           A tattoo was seen in  the sigmoid colon, 25 cm from                            the anal verge. No residual or recurrent polyp.                            Approximately 4 passes were made in the area                            examined carefully.. The tattoo site appeared                            normal.                           A few small-mouthed diverticula were found in the                            sigmoid colon.                           Non-bleeding internal hemorrhoids were found during                            retroflexion. The hemorrhoids were Grade I                            (internal hemorrhoids that do not prolapse).                           The terminal ileum appeared normal.                           The exam was otherwise without abnormality on                            direct and retroflexion views. Complications:            No immediate complications. Estimated Blood Loss:      Estimated blood loss: none. Impression:               - Two 2 to 6 mm polyps in the mid ascending colon                            and in the cecum, removed with a cold snare.                            Resected and retrieved.                           - A tattoo was seen in the sigmoid colon. No                            residual/recurrent polyps.                           -  Mild sigmoid diverticulosis.                           - Non-bleeding internal hemorrhoids.                           - The examined portion of the ileum was normal.                           - The examination was otherwise normal on direct                            and retroflexion views. Recommendation:           - Patient has a contact number available for                            emergencies. The signs and symptoms of potential                            delayed complications were discussed with the                            patient. Return to normal activities tomorrow.                            Written discharge instructions were provided to the                            patient.                           - Resume previous diet.                           - Continue present medications.                           - Await pathology results.                           - Repeat colonoscopy in 3 years for surveillance.                           - The findings and recommendations were discussed                            with the patient's family. Jackquline Denmark, MD 02/28/2021 9:06:21 AM This report has been signed electronically.

## 2021-02-28 NOTE — Patient Instructions (Signed)
Handouts Provided:  Polyps and Diverticulosis ° °YOU HAD AN ENDOSCOPIC PROCEDURE TODAY AT THE [facility]:   Refer to the procedure report that was given to you for any specific questions about what was found during the examination.  If the procedure report does not answer your questions, please call your gastroenterologist to clarify.  If you requested that your care partner not be given the details of your procedure findings, then the procedure report has been included in a sealed envelope for you to review at your convenience later. ° °YOU SHOULD EXPECT: Some feelings of bloating in the abdomen. Passage of more gas than usual.  Walking can help get rid of the air that was put into your GI tract during the procedure and reduce the bloating. If you had a lower endoscopy (such as a colonoscopy or flexible sigmoidoscopy) you may notice spotting of blood in your stool or on the toilet paper. If you underwent a bowel prep for your procedure, you may not have a normal bowel movement for a few days. ° °Please Note:  You might notice some irritation and congestion in your nose or some drainage.  This is from the oxygen used during your procedure.  There is no need for concern and it should clear up in a day or so. ° °SYMPTOMS TO REPORT IMMEDIATELY: ° °Following lower endoscopy (colonoscopy or flexible sigmoidoscopy): ° Excessive amounts of blood in the stool ° Significant tenderness or worsening of abdominal pains ° Swelling of the abdomen that is new, acute ° Fever of 100°F or higher ° °For urgent or emergent issues, a gastroenterologist can be reached at any hour by calling [phone]. °Do not use MyChart messaging for urgent concerns.  ° ° °DIET:  We do recommend a small meal at first, but then you may proceed to your regular diet.  Drink plenty of fluids but you should avoid alcoholic beverages for 24 hours. ° °ACTIVITY:  You should plan to take it easy for the rest of today and you should NOT DRIVE  or use heavy machinery until tomorrow (because of the sedation medicines used during the test).   ° °FOLLOW UP: °Our staff will call the number listed on your records 48-72 hours following your procedure to check on you and address any questions or concerns that you may have regarding the information given to you following your procedure. If we do not reach you, we will leave a message.  We will attempt to reach you two times.  During this call, we will ask if you have developed any symptoms of COVID 19. If you develop any symptoms (ie: fever, flu-like symptoms, shortness of breath, cough etc.) before then, please call [phone].  If you test positive for Covid 19 in the 2 weeks post procedure, please call and report this information to us.   ° °If any biopsies were taken you will be contacted by phone or by letter within the next 1-3 weeks.  Please call us at [phone] if you have not heard about the biopsies in 3 weeks.  ° ° °SIGNATURES/CONFIDENTIALITY: °You and/or your care partner have signed paperwork which will be entered into your electronic medical record.  These signatures attest to the fact that that the information above on your After Visit Summary has been reviewed and is understood.  Full responsibility of the confidentiality of this discharge information lies with you and/or your care-partner. ° °

## 2021-02-28 NOTE — Progress Notes (Signed)
Report given to PACU, vss 

## 2021-02-28 NOTE — Progress Notes (Signed)
Called to room to assist during endoscopic procedure.  Patient ID and intended procedure confirmed with present staff. Received instructions for my participation in the procedure from the performing physician.  

## 2021-02-28 NOTE — Progress Notes (Signed)
Pt's states no medical or surgical changes since previsit or office visit.  VS CW  

## 2021-02-28 NOTE — Progress Notes (Signed)
[name] History and Physical   Primary Care Physician:  Carlena Hurl, PA-C   Reason for Procedure:   History of malignant sigmoid polyp.  history of polyps  Plan:     colonoscopy     HPI: Bradley Novak is a 71 y.o. male    Past Medical History:  Diagnosis Date   Contact lens/glasses fitting    GERD (gastroesophageal reflux disease)    History of chemotherapy    1 yr chemo completed 03/2018- in remission per patient   History of hiatal hernia    Hodgkin's lymphoma ([place]) 09/2017   Hyperlipidemia    Hypertension 2006   Obesity    Pneumonia 09/2017   SVT (supraventricular tachycardia) ([place])    Wears glasses     Past Surgical History:  Procedure Laterality Date   CARDIAC CATHETERIZATION  1980s   COLONOSCOPY  05/20/2019   Dr.Davarius Ridener   IR IMAGING GUIDED PORT INSERTION  11/20/2017   IR REMOVAL TUN ACCESS W/ PORT W/O FL MOD SED  06/02/2018   SIGMOIDOSCOPY  08/21/2019   Dr.Seyed Heffley   SVT ABLATION  01/29/2018   SVT ABLATION N/A 01/29/2018   Procedure: SVT ABLATION;  Surgeon: Constance Haw, MD;  Location: [facility];  Service: Cardiovascular;  Laterality: N/A;   WISDOM TOOTH EXTRACTION      Prior to Admission medications   Medication Sig Start Date End Date Taking? Authorizing Provider  atorvastatin (LIPITOR) 20 MG tablet TAKE 1 TABLET BY MOUTH DAILY 11/17/20  Yes Tysinger, Camelia Eng, PA-C  carvedilol (COREG) 6.25 MG tablet Take 1 tablet (6.25 mg total) by mouth 2 (two) times daily. 11/17/20  Yes Tysinger, Camelia Eng, PA-C  CRANBERRY PO Take 1 capsule by mouth daily.   Yes [provider]  diltiazem (CARDIZEM CD) 120 MG 24 hr capsule Take 1 capsule (120 mg total) by mouth daily as needed. Patient taking differently: Take 120 mg by mouth as needed. 04/15/20  Yes Camnitz, Will Hassell Done, MD  diltiazem (CARDIZEM) 30 MG tablet Take 1 tablet (30 mg total) by mouth 2 (two) times daily as needed (daily PRN for palpitations). 04/07/20  Yes Baldwin Jamaica, PA-C  esomeprazole (NEXIUM) 20 MG capsule Take 20 mg by mouth 2 (two) times daily.   Yes [provider]  finasteride (PROSCAR) 5 MG tablet TAKE ONE TABLET BY MOUTH ONCE DAILY 02/03/21  Yes Tysinger, Camelia Eng, PA-C  losartan (COZAAR) 100 MG tablet Take 1 tablet (100 mg total) by mouth daily. 11/17/20  Yes Tysinger, Camelia Eng, PA-C  tadalafil (CIALIS) 20 MG tablet TAKE ONE TABLET BY MOUTH DAILY AS NEEDED FOR ERECTILE DYSFUNCTION 11/17/20  Yes Tysinger, Camelia Eng, PA-C  nitroGLYCERIN (NITROSTAT) 0.3 MG SL tablet Place 1 tablet (0.3 mg total) under the tongue every 5 (five) minutes as needed for chest pain. 02/14/18   Tysinger, Camelia Eng, PA-C    Current Outpatient Medications  Medication Sig Dispense Refill   atorvastatin (LIPITOR) 20 MG tablet TAKE 1 TABLET BY MOUTH DAILY 90 tablet 3   carvedilol (COREG) 6.25 MG tablet Take 1 tablet (6.25 mg total) by mouth 2 (two) times daily. 180 tablet 3   CRANBERRY PO Take 1 capsule by mouth daily.     diltiazem (CARDIZEM CD) 120 MG 24 hr capsule Take 1 capsule (120 mg total) by mouth daily as needed. (Patient taking differently: Take 120 mg by mouth as needed.) 90 capsule 3   diltiazem (CARDIZEM) 30 MG tablet Take 1 tablet (30 mg  total) by mouth 2 (two) times daily as needed (daily PRN for palpitations). 90 tablet 1   esomeprazole (NEXIUM) 20 MG capsule Take 20 mg by mouth 2 (two) times daily.     finasteride (PROSCAR) 5 MG tablet TAKE ONE TABLET BY MOUTH ONCE DAILY 90 tablet 0   losartan (COZAAR) 100 MG tablet Take 1 tablet (100 mg total) by mouth daily. 90 tablet 0   tadalafil (CIALIS) 20 MG tablet TAKE ONE TABLET BY MOUTH DAILY AS NEEDED FOR ERECTILE DYSFUNCTION 60 tablet 5   nitroGLYCERIN (NITROSTAT) 0.3 MG SL tablet Place 1 tablet (0.3 mg total) under the tongue every 5 (five) minutes as needed for chest pain. 30 tablet 0   Current Facility-Administered Medications  Medication Dose Route Frequency Provider Last Rate Last Admin   0.9 %  sodium  chloride infusion  500 mL Intravenous Once Jackquline Denmark, MD        Allergies as of 02/28/2021   (No Known Allergies)    Family History  Problem Relation Age of Onset   Hypertension Father    Hypertension Brother    Hypertension Brother    Hypertension Brother    Hypertension Brother    Colon cancer Neg Hx    Esophageal cancer Neg Hx    Rectal cancer Neg Hx    Stomach cancer Neg Hx    Colon polyps Neg Hx     Social History   Socioeconomic History   Marital status: Married    Spouse name: Not on file   Number of children: Not on file   Years of education: Not on file   Highest education level: Not on file  Occupational History   Not on file  Tobacco Use   Smoking status: Former    Packs/day: 2.00    Years: 5.00    Pack years: 10.00    Types: Cigarettes    Quit date: 1974    Years since quitting: 49.1   Smokeless tobacco: Never  Vaping Use   Vaping Use: Never used  Substance and Sexual Activity   Alcohol use: No   Drug use: Never   Sexual activity: Not Currently  Other Topics Concern   Not on file  Social History Narrative   Married, exercise - walks, [employer];  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  Physical Exam: Vital signs in last 24 hours: @VSRANGES @   General:   Alert,  Well-developed, well-nourished, pleasant and cooperative in NAD Lungs:  Clear throughout to auscultation.   Heart:  Regular rate and rhythm; no murmurs, clicks, rubs,  or gallops. Abdomen:  Soft, nontender and nondistended. Normal  bowel sounds.   Neuro/Psych:  Alert and cooperative. Normal mood and affect. A and O x 3    No significant changes were identified.  The patient continues to be an appropriate candidate for the planned procedure and anesthesia.   Carmell Austria, MD. [facility] 02/28/2021 8:39 AM@

## 2021-03-01 DIAGNOSIS — M13861 Other specified arthritis, right knee: Secondary | ICD-10-CM | POA: Diagnosis not present

## 2021-03-01 DIAGNOSIS — M13862 Other specified arthritis, left knee: Secondary | ICD-10-CM | POA: Diagnosis not present

## 2021-03-02 ENCOUNTER — Telehealth: Payer: Self-pay | Admitting: *Deleted

## 2021-03-02 NOTE — Telephone Encounter (Signed)
No answer on first follow up call. Left message  °

## 2021-03-02 NOTE — Telephone Encounter (Signed)
No answer for post procedure call back Left Vm .

## 2021-03-13 ENCOUNTER — Encounter: Payer: Self-pay | Admitting: Gastroenterology

## 2021-03-21 ENCOUNTER — Telehealth: Payer: Self-pay | Admitting: Medical

## 2021-03-21 NOTE — Telephone Encounter (Signed)
Left message for patient to call back and schedule Medicare Annual Wellness Visit (AWV) either virtually or in office. I left my number for patient to call [phone].   awvi 12/15/17 per [facility]  please schedule at anytime with health coach  This should be a 45 minute visit.   

## 2021-03-23 ENCOUNTER — Other Ambulatory Visit: Payer: Self-pay | Admitting: Internal Medicine

## 2021-03-23 MED ORDER — LOSARTAN POTASSIUM 100 MG PO TABS: 100.0000 mg | ORAL_TABLET | Freq: Every day | ORAL | 2 refills | Status: DC

## 2021-04-05 ENCOUNTER — Telehealth: Payer: Self-pay | Admitting: Medical

## 2021-04-05 NOTE — Telephone Encounter (Signed)
Spoke with patient to schedule Medicare Annual Wellness Visit (AWV) either virtually or in office. ? ?Pt stated he wcb  he didn't want to schedule right now  ? ?awvi 12/15/17 per [facility]  ? please schedule at anytime with health coach ? ? ?

## 2021-04-13 DIAGNOSIS — M1712 Unilateral primary osteoarthritis, left knee: Secondary | ICD-10-CM | POA: Diagnosis not present

## 2021-04-13 DIAGNOSIS — M1711 Unilateral primary osteoarthritis, right knee: Secondary | ICD-10-CM | POA: Diagnosis not present

## 2021-05-03 ENCOUNTER — Other Ambulatory Visit: Payer: Medicare Other

## 2021-05-03 ENCOUNTER — Ambulatory Visit: Payer: Medicare Other | Admitting: Hematology & Oncology

## 2021-05-09 ENCOUNTER — Encounter: Payer: Self-pay | Admitting: Hematology & Oncology

## 2021-05-09 ENCOUNTER — Inpatient Hospital Stay (HOSPITAL_BASED_OUTPATIENT_CLINIC_OR_DEPARTMENT_OTHER): Payer: Medicare Other | Admitting: Hematology & Oncology

## 2021-05-09 ENCOUNTER — Inpatient Hospital Stay: Payer: Medicare Other | Attending: Hematology & Oncology

## 2021-05-09 VITALS — BP 171/85 | HR 65 | Temp 98.0°F | Resp 18 | Ht 71.0 in | Wt 261.2 lb

## 2021-05-09 DIAGNOSIS — C8190 Hodgkin lymphoma, unspecified, unspecified site: Secondary | ICD-10-CM | POA: Diagnosis not present

## 2021-05-09 DIAGNOSIS — C8100 Nodular lymphocyte predominant Hodgkin lymphoma, unspecified site: Secondary | ICD-10-CM | POA: Diagnosis not present

## 2021-05-09 DIAGNOSIS — C187 Malignant neoplasm of sigmoid colon: Secondary | ICD-10-CM | POA: Insufficient documentation

## 2021-05-09 LAB — CBC WITH DIFFERENTIAL (CANCER CENTER ONLY)
Abs Immature Granulocytes: 0.05 10*3/uL (ref 0.00–0.07)
Basophils Absolute: 0.1 10*3/uL (ref 0.0–0.1)
Basophils Relative: 1 %
Eosinophils Absolute: 0.2 10*3/uL (ref 0.0–0.5)
Eosinophils Relative: 2 %
HCT: 38.2 % — ABNORMAL LOW (ref 39.0–52.0)
Hemoglobin: 12.9 g/dL — ABNORMAL LOW (ref 13.0–17.0)
Immature Granulocytes: 1 %
Lymphocytes Relative: 18 %
Lymphs Abs: 1.9 10*3/uL (ref 0.7–4.0)
MCH: 30.2 pg (ref 26.0–34.0)
MCHC: 33.8 g/dL (ref 30.0–36.0)
MCV: 89.5 fL (ref 80.0–100.0)
Monocytes Absolute: 0.7 10*3/uL (ref 0.1–1.0)
Monocytes Relative: 7 %
Neutro Abs: 7.9 10*3/uL — ABNORMAL HIGH (ref 1.7–7.7)
Neutrophils Relative %: 71 %
Platelet Count: 213 10*3/uL (ref 150–400)
RBC: 4.27 MIL/uL (ref 4.22–5.81)
RDW: 13.3 % (ref 11.5–15.5)
WBC Count: 10.8 10*3/uL — ABNORMAL HIGH (ref 4.0–10.5)
nRBC: 0 % (ref 0.0–0.2)

## 2021-05-09 LAB — SAVE SMEAR(SSMR), FOR PROVIDER SLIDE REVIEW

## 2021-05-09 LAB — CMP (CANCER CENTER ONLY)
ALT: 22 U/L (ref 0–44)
AST: 16 U/L (ref 15–41)
Albumin: 3.9 g/dL (ref 3.5–5.0)
Alkaline Phosphatase: 66 U/L (ref 38–126)
Anion gap: 8 (ref 5–15)
BUN: 25 mg/dL — ABNORMAL HIGH (ref 8–23)
CO2: 27 mmol/L (ref 22–32)
Calcium: 9.3 mg/dL (ref 8.9–10.3)
Chloride: 103 mmol/L (ref 98–111)
Creatinine: 1.12 mg/dL (ref 0.61–1.24)
GFR, Estimated: >60 mL/min (ref >60)
Glucose, Bld: 105 mg/dL — ABNORMAL HIGH (ref 70–99)
Potassium: 3.9 mmol/L (ref 3.5–5.1)
Sodium: 138 mmol/L (ref 135–145)
Total Bilirubin: 0.8 mg/dL (ref 0.3–1.2)
Total Protein: 6.9 g/dL (ref 6.5–8.1)

## 2021-05-09 LAB — LACTATE DEHYDROGENASE: LDH: 130 U/L (ref 98–192)

## 2021-05-09 NOTE — Progress Notes (Signed)
?Hematology and Oncology Follow Up Visit ? ?Bradley Novak ?[id number] ?08/02/1950 71 y.o. ?05/09/2021 ? ? ?Principle Diagnosis:  ?Stage III Classical Hodgkin's Lymphoma ?Stage I (T1N0M0) adenocarcinoma of the sigmoid colon ? ?Current Therapy:   ?S/P ABVD x 51/2 cycles -- completed in 03/2018 ?S/P resection of tumor in 05/2018 ?    ?Interim History:  Bradley Novak is back for follow-up.  We saw him 6 months ago.  He had no problems over the winter.  He had no problems over the holiday season. ? ?He has been quite busy.  He does chores around the house.  He has been planting some bushes. ? ?He has had no problems with fever.  He has had no issues with COVID.  He has had no problems with bowels or bladder.  He has had no nausea or vomiting.  There is been no rashes.  He has had no headache. ? ?Overall, I would have to say that his performance status is probably ECOG 1.   ? ? ?Medications:  ?Current Outpatient Medications:  ?  atorvastatin (LIPITOR) 20 MG tablet, TAKE 1 TABLET BY MOUTH DAILY, Disp: 90 tablet, Rfl: 3 ?  carvedilol (COREG) 6.25 MG tablet, Take 1 tablet (6.25 mg total) by mouth 2 (two) times daily., Disp: 180 tablet, Rfl: 3 ?  CRANBERRY PO, Take 1 capsule by mouth daily., Disp: , Rfl:  ?  diltiazem (CARDIZEM CD) 120 MG 24 hr capsule, Take 1 capsule (120 mg total) by mouth daily as needed., Disp: 90 capsule, Rfl: 3 ?  diltiazem (CARDIZEM) 30 MG tablet, Take 1 tablet (30 mg total) by mouth 2 (two) times daily as needed (daily PRN for palpitations)., Disp: 90 tablet, Rfl: 1 ?  esomeprazole (NEXIUM) 20 MG capsule, Take 20 mg by mouth 2 (two) times daily., Disp: , Rfl:  ?  finasteride (PROSCAR) 5 MG tablet, TAKE ONE TABLET BY MOUTH ONCE DAILY, Disp: 90 tablet, Rfl: 0 ?  losartan (COZAAR) 100 MG tablet, Take 1 tablet (100 mg total) by mouth daily., Disp: 90 tablet, Rfl: 2 ?  nitroGLYCERIN (NITROSTAT) 0.3 MG SL tablet, Place 1 tablet (0.3 mg total) under the tongue every 5 (five) minutes as needed for chest pain.,  Disp: 30 tablet, Rfl: 0 ?  tadalafil (CIALIS) 20 MG tablet, TAKE ONE TABLET BY MOUTH DAILY AS NEEDED FOR ERECTILE DYSFUNCTION, Disp: 60 tablet, Rfl: 5 ? ?Allergies: No Known Allergies ? ?Past Medical History, Surgical history, Social history, and Family History were reviewed and updated. ? ?Review of Systems: ?Review of Systems  ?Constitutional: Negative.   ?HENT:  Negative.    ?Eyes: Negative.   ?Respiratory: Negative.    ?Cardiovascular: Negative.   ?Gastrointestinal: Negative.   ?Endocrine: Negative.   ?Genitourinary: Negative.    ?Musculoskeletal: Negative.   ?Skin: Negative.   ?Neurological: Negative.   ?Hematological: Negative.   ?Psychiatric/Behavioral: Negative.    ? ?Physical Exam: ? height is '5\' 11"'$  (1.803 m) and weight is 261 lb 4 oz (118.5 kg). His oral temperature is 98 ?F (36.7 ?C). His blood pressure is 171/85 (abnormal) and his pulse is 65. His respiration is 18.  ? ?Wt Readings from Last 3 Encounters:  ?05/09/21 261 lb 4 oz (118.5 kg)  ?02/28/21 253 lb (114.8 kg)  ?02/08/21 253 lb (114.8 kg)  ? ? ?Physical Exam ?Vitals reviewed.  ?HENT:  ?   Head: Normocephalic and atraumatic.  ?Eyes:  ?   Pupils: Pupils are equal, round, and reactive to light.  ?Cardiovascular:  ?  Rate and Rhythm: Normal rate and regular rhythm.  ?   Heart sounds: Normal heart sounds.  ?Pulmonary:  ?   Effort: Pulmonary effort is normal.  ?   Breath sounds: Normal breath sounds.  ?Abdominal:  ?   General: Bowel sounds are normal.  ?   Palpations: Abdomen is soft.  ?Musculoskeletal:     ?   General: No tenderness or deformity. Normal range of motion.  ?   Cervical back: Normal range of motion.  ?Lymphadenopathy:  ?   Cervical: No cervical adenopathy.  ?Skin: ?   General: Skin is warm and dry.  ?   Findings: No erythema or rash.  ?Neurological:  ?   Mental Status: He is alert and oriented to person, place, and time.  ?Psychiatric:     ?   Behavior: Behavior normal.     ?   Thought Content: Thought content normal.     ?   Judgment:  Judgment normal.  ? ? ? ?Lab Results  ?Component Value Date  ? WBC 9.2 12/02/2020  ? HGB 12.2 (L) 12/02/2020  ? HCT 36.8 (L) 12/02/2020  ? MCV 92.0 12/02/2020  ? PLT 203 12/02/2020  ? ?  Chemistry   ?   ?Component Value Date/Time  ? NA 143 12/22/2020 1223  ? K 4.3 12/22/2020 1223  ? CL 105 12/22/2020 1223  ? CO2 24 12/22/2020 1223  ? BUN 22 12/22/2020 1223  ? CREATININE 1.14 12/22/2020 1223  ? CREATININE 1.44 (H) 11/02/2020 1146  ? CREATININE 0.83 09/27/2014 0001  ?    ?Component Value Date/Time  ? CALCIUM 9.3 12/22/2020 1223  ? ALKPHOS 70 11/02/2020 1146  ? AST 12 (L) 11/02/2020 1146  ? ALT 23 11/02/2020 1146  ? BILITOT 0.6 11/02/2020 1146  ?  ? ? ?Impression and Plan: ?Bradley Novak is a very nice 71 year old white male.  He has 2 separate malignancies.  Clearly, the Hodgkin's disease was a much more aggressive malignancy. He was treated with chemotherapy. ? ?Everything looks fine.  I do not see any evidence of recurrent disease.  It has now been 3 years since he has had treatment. ? ?I think the odds of him having recurrence probably will be less than 10%. ? ?We will still follow him along.  We will plan to get him back in another 6 months. ? ? ? ?Volanda Napoleon, MD ?4/25/20238:12 AM ?

## 2021-05-15 ENCOUNTER — Other Ambulatory Visit: Payer: Self-pay | Admitting: Medical

## 2021-05-23 DIAGNOSIS — M25561 Pain in right knee: Secondary | ICD-10-CM | POA: Diagnosis not present

## 2021-05-23 DIAGNOSIS — M25562 Pain in left knee: Secondary | ICD-10-CM | POA: Diagnosis not present

## 2021-05-23 DIAGNOSIS — M13862 Other specified arthritis, left knee: Secondary | ICD-10-CM | POA: Diagnosis not present

## 2021-05-24 ENCOUNTER — Ambulatory Visit (INDEPENDENT_AMBULATORY_CARE_PROVIDER_SITE_OTHER): Payer: Medicare Other | Admitting: Medical

## 2021-05-24 VITALS — BP 120/80 | HR 90 | Wt 261.2 lb

## 2021-05-24 DIAGNOSIS — I2583 Coronary atherosclerosis due to lipid rich plaque: Secondary | ICD-10-CM

## 2021-05-24 DIAGNOSIS — I700 Atherosclerosis of aorta: Secondary | ICD-10-CM | POA: Diagnosis not present

## 2021-05-24 DIAGNOSIS — I2510 Atherosclerotic heart disease of native coronary artery without angina pectoris: Secondary | ICD-10-CM

## 2021-05-24 DIAGNOSIS — Z862 Personal history of diseases of the blood and blood-forming organs and certain disorders involving the immune mechanism: Secondary | ICD-10-CM | POA: Diagnosis not present

## 2021-05-24 DIAGNOSIS — Z9221 Personal history of antineoplastic chemotherapy: Secondary | ICD-10-CM | POA: Diagnosis not present

## 2021-05-24 DIAGNOSIS — M170 Bilateral primary osteoarthritis of knee: Secondary | ICD-10-CM | POA: Diagnosis not present

## 2021-05-24 DIAGNOSIS — I10 Essential (primary) hypertension: Secondary | ICD-10-CM

## 2021-05-24 DIAGNOSIS — Z8572 Personal history of non-Hodgkin lymphomas: Secondary | ICD-10-CM

## 2021-05-24 DIAGNOSIS — Z01818 Encounter for other preprocedural examination: Secondary | ICD-10-CM | POA: Diagnosis not present

## 2021-05-24 DIAGNOSIS — R7309 Other abnormal glucose: Secondary | ICD-10-CM

## 2021-05-24 LAB — POCT GLYCOSYLATED HEMOGLOBIN (HGB A1C): Hemoglobin A1C: 5.4 % (ref 4.0–5.6)

## 2021-05-24 MED ORDER — NITROGLYCERIN 0.3 MG SL SUBL: 0.3000 mg | SUBLINGUAL_TABLET | SUBLINGUAL | 0 refills | Status: AC | PRN

## 2021-05-24 NOTE — Progress Notes (Addendum)
Subjective: ? Bradley Novak is a 71 y.o. male who presents for ?Chief Complaint  ?Patient presents with  ? surgical clearance  ?  Surgery- within the next couple months- left total knee replacement. Still has to go to cardiology for clearance too  ?   ?Here for surgery clearance. ? ?Patient Care Team: ?Korion Cuevas, Leward Quan as PCP - General (Family Medicine) ?Nahser, Wonda Cheng, MD as PCP - Cardiology (Cardiology) ?Constance Haw, MD as PCP - Electrophysiology (Cardiology) ?Michael Boston, MD as Consulting Physician (General Surgery) ?Jackquline Denmark, MD as Consulting Physician (Gastroenterology) ?Netta Cedars, MD as Consulting Physician (Orthopedic Surgery) ?Volanda Napoleon, MD as Consulting Physician (Oncology) ?Netta Cedars, MD as Consulting Physician (Orthopedic Surgery) ? ? ?Has cardiology follow-up planned for surgery clearance as well. ? ?Medical history includes history of Hodgkin's lymphoma, history of chemotherapy, coronary artery disease, atherosclerosis of aorta, hypertension, hyperlipidemia, impaired fasting glucose, GERD, erectile dysfunction, obesity, arthritis, SVT. ? ?He reports compliance with medications. ? ?He does check sugars and glucose has been 90-130 fasting.   ? ?No prior issues with anesthesia.   ? ?No other aggravating or relieving factors.   ? ?No other c/o. ? ?Past Medical History:  ?Diagnosis Date  ? Contact lens/glasses fitting   ? GERD (gastroesophageal reflux disease)   ? History of chemotherapy   ? 1 yr chemo completed 03/2018- in remission per patient  ? History of hiatal hernia   ? Hodgkin's lymphoma ([place]) 09/2017  ? Hyperlipidemia   ? Hypertension 2006  ? Obesity   ? Pneumonia 09/2017  ? SVT (supraventricular tachycardia) ([place])   ? Wears glasses   ? ?Current Outpatient Medications on File Prior to Visit  ?Medication Sig Dispense Refill  ? atorvastatin (LIPITOR) 20 MG tablet TAKE 1 TABLET BY MOUTH DAILY 90 tablet 3  ? carvedilol (COREG) 6.25 MG tablet Take 1 tablet (6.25  mg total) by mouth 2 (two) times daily. 180 tablet 3  ? CRANBERRY PO Take 1 capsule by mouth daily.    ? diltiazem (CARDIZEM CD) 120 MG 24 hr capsule Take 1 capsule (120 mg total) by mouth daily as needed. 90 capsule 3  ? diltiazem (CARDIZEM) 30 MG tablet Take 1 tablet (30 mg total) by mouth 2 (two) times daily as needed (daily PRN for palpitations). 90 tablet 1  ? esomeprazole (NEXIUM) 20 MG capsule Take 20 mg by mouth 2 (two) times daily.    ? finasteride (PROSCAR) 5 MG tablet TAKE ONE TABLET BY MOUTH ONCE DAILY 90 tablet 0  ? losartan (COZAAR) 100 MG tablet Take 1 tablet (100 mg total) by mouth daily. 90 tablet 2  ? tadalafil (CIALIS) 20 MG tablet TAKE ONE TABLET BY MOUTH DAILY AS NEEDED FOR ERECTILE DYSFUNCTION 60 tablet 5  ? ?No current facility-administered medications on file prior to visit.  ? ?Past Surgical History:  ?Procedure Laterality Date  ? CARDIAC CATHETERIZATION  1980s  ? COLONOSCOPY  05/20/2019  ? Dr.Gupta  ? IR IMAGING GUIDED PORT INSERTION  11/20/2017  ? IR REMOVAL TUN ACCESS W/ PORT W/O FL MOD SED  06/02/2018  ? SIGMOIDOSCOPY  08/21/2019  ? Dr.Gupta  ? SVT ABLATION  01/29/2018  ? SVT ABLATION N/A 01/29/2018  ? Procedure: SVT ABLATION;  Surgeon: Constance Haw, MD;  Location: [facility];  Service: Cardiovascular;  Laterality: N/A;  ? WISDOM TOOTH EXTRACTION    ? ?The following portions of the patient's history were reviewed and updated as appropriate: allergies, current medications, past  family history, past medical history, past social history, past surgical history and problem list. ? ?ROS ?Otherwise as in subjective above ? ? ? ? ?Objective: ?BP 120/80   Pulse 90   Wt 261 lb 3.2 oz (118.5 kg)   BMI 36.43 kg/m?  ? ?BP Readings from Last 3 Encounters:  ?05/24/21 120/80  ?05/09/21 (!) 171/85  ?02/28/21 (!) 147/74  ? ?Wt Readings from Last 3 Encounters:  ?05/24/21 261 lb 3.2 oz (118.5 kg)  ?05/09/21 261 lb 4 oz (118.5 kg)  ?02/28/21 253 lb (114.8 kg)  ? ?General appearance: alert,  no distress, well developed, well nourished ?HEENT: normocephalic, sclerae anicteric, conjunctiva pink and moist, nares patent, no discharge or erythema, pharynx normal ?Oral cavity: MMM, no lesions ?Neck: supple, no lymphadenopathy, no thyromegaly, no masses, no JVD or bruits ?Heart: RRR, normal S1, S2, no murmurs ?Lungs: CTA bilaterally, no wheezes, rhonchi, or rales ?Abdomen: +bs, soft, non tender, non distended, no masses, no hepatomegaly, no splenomegaly ?Pulses: 2+ radial pulses, 2+ pedal pulses, normal cap refill ?Ext: no edema ?Neuro: CN2-12 intact, A&Ox3, nonfocal exam ? ? ?Echocardiogram April 2022: ?1. Left ventricular ejection fraction, by estimation, is 55 to 60%. The left ventricle has normal ?function. The left ventricle has no regional wall motion abnormalities. There is mild left ?ventricular hypertrophy. Left ventricular diastolic parameters are indeterminate. ?2. Right ventricular systolic function is normal. The right ventricular size is normal. Tricuspid ?regurgitation signal is inadequate for assessing PA pressure. ?3. The mitral valve is normal in structure. No evidence of mitral valve regurgitation. No ?evidence of mitral stenosis. ?4. The aortic valve is tricuspid. Aortic valve regurgitation is not visualized. Mild aortic valve ?sclerosis is present, with no evidence of aortic valve stenosis. ?5. Aortic dilatation noted. There is mild dilatation of the ascending aorta, measuring 40 mm. ? ? ? ?Assessment: ?Encounter Diagnoses  ?Name Primary?  ? Preop examination Yes  ? Atherosclerosis of aorta ([place])   ? Coronary artery disease due to lipid rich plaque   ? Essential hypertension   ? History of chemotherapy   ? History of lymphoma   ? Arthritis of both knees   ? History of anemia   ? Elevated glucose   ? ? ? ?Plan: ?I reviewed recent labs and chart record.  Labs from 2 weeks ago on May 09, 2021 showed blood sugar elevated at 105, otherwise comprehensive metabolic panel was normal.  Recent CBC  blood count shows hemoglobin 12.9 slightly reduced but unchanged from 5 months ago, white blood cell count slightly elevated at 10.8.  LDH has been stable, recently on May 09, 2021 LDH was 130. ? ?Regarding anemia, 5 months ago folate was normal, iron levels were normal, B12 was normal.  He had a colonoscopy February 28, 2021 showing 2 polyps in the ascending colon and the cecum, tattoo in the sigmoid colon, mild sigmoid  diverticulosis without obvious bleeding. ? ?Lipid panel 5 months ago was at goal, LDL 62. ? ?Impaired fasting glucose-updated hemoglobin A1c today at goal. ? ?Hypertension-blood pressures look good today, continue current medications ? ?Coronary artery disease, hyperlipidemia, aortic atherosclerosis-continue statin ? ?SVT - uses diltiazem prn for palpitations, and hasn't had to use recently ? ?I sent a message to Dr Marin Olp regarding recent elevated WBCs.    ? ?Follow-up soon with cardiology for clearance for surgery as well. ? ?I refilled nitroglycerin at his request since he has had expired.  He has never had a unit this.  Discussed the importance of never taking  this within 24 hours of Viagra or similar medication due to significant interaction which could include death. ? ?I also inquired about sleep apnea.  He denies any symptoms related to sleep apnea.  I discussed that sometimes people have sleep apnea without realizing they have not.  Nevertheless he says he sleeps actually fine without snoring, no witnessed apnea.  Declines sleep study. ? ? ?Damieon was seen today for surgical clearance. ? ?Diagnoses and all orders for this visit: ? ?Preop examination ? ?Atherosclerosis of aorta ([place]) ? ?Coronary artery disease due to lipid rich plaque ? ?Essential hypertension ? ?History of chemotherapy ? ?History of lymphoma ? ?Arthritis of both knees ? ?History of anemia ? ?Elevated glucose ?-     HgB A1c ? ?Other orders ?-     nitroGLYCERIN (NITROSTAT) 0.3 MG SL tablet; Place 1 tablet (0.3 mg total)  under the tongue every 5 (five) minutes as needed for chest pain. ? ?Spent > 45 minutes face to face with patient in discussion of symptoms, evaluation, plan and recommendations.   ? ?Follow up: P[place]

## 2021-05-29 NOTE — Progress Notes (Signed)
PCP:  Carlena Hurl, PA-C Primary Cardiologist: Mertie Moores, MD Electrophysiologist: Constance Haw, MD   Bradley Novak is a 71 y.o. male seen today for Will Meredith Leeds, MD for routine electrophysiology followup.  Since last being seen in our clinic the patient reports doing well. He does have intermittently palpitations a couple of times a week.  He is not taking his diltiazem regularly. When he has a breakthrough, he will take diltiazem long acting for 1-2 days usually with good effect. Pending left knee replacement, and then likely right knee down the road. he denies chest pain, dyspnea, PND, orthopnea, nausea, vomiting, dizziness, syncope, edema, weight gain, or early satiety.  Past Medical History:  Diagnosis Date   Contact lens/glasses fitting    GERD (gastroesophageal reflux disease)    History of chemotherapy    1 yr chemo completed 03/2018- in remission per patient   History of hiatal hernia    Hodgkin's lymphoma ([place]) 09/2017   Hyperlipidemia    Hypertension 2006   Obesity    Pneumonia 09/2017   SVT (supraventricular tachycardia) ([place])    Wears glasses    Past Surgical History:  Procedure Laterality Date   CARDIAC CATHETERIZATION  1980s   COLONOSCOPY  05/20/2019   Dr.Gupta   IR IMAGING GUIDED PORT INSERTION  11/20/2017   IR REMOVAL TUN ACCESS W/ PORT W/O FL MOD SED  06/02/2018   SIGMOIDOSCOPY  08/21/2019   Dr.Gupta   SVT ABLATION  01/29/2018   SVT ABLATION N/A 01/29/2018   Procedure: SVT ABLATION;  Surgeon: Constance Haw, MD;  Location: [facility];  Service: Cardiovascular;  Laterality: N/A;   WISDOM TOOTH EXTRACTION      Current Outpatient Medications  Medication Sig Dispense Refill   atorvastatin (LIPITOR) 20 MG tablet TAKE 1 TABLET BY MOUTH DAILY 90 tablet 3   carvedilol (COREG) 6.25 MG tablet Take 1 tablet (6.25 mg total) by mouth 2 (two) times daily. 180 tablet 3   CRANBERRY PO Take 1 capsule by mouth daily.     diltiazem  (CARDIZEM CD) 120 MG 24 hr capsule Take 1 capsule (120 mg total) by mouth daily as needed. 90 capsule 3   diltiazem (CARDIZEM) 30 MG tablet Take 1 tablet (30 mg total) by mouth 2 (two) times daily as needed (daily PRN for palpitations). 90 tablet 1   esomeprazole (NEXIUM) 20 MG capsule Take 20 mg by mouth 2 (two) times daily.     finasteride (PROSCAR) 5 MG tablet TAKE ONE TABLET BY MOUTH ONCE DAILY 90 tablet 0   losartan (COZAAR) 100 MG tablet Take 1 tablet (100 mg total) by mouth daily. 90 tablet 2   nitroGLYCERIN (NITROSTAT) 0.3 MG SL tablet Place 1 tablet (0.3 mg total) under the tongue every 5 (five) minutes as needed for chest pain. 30 tablet 0   tadalafil (CIALIS) 20 MG tablet TAKE ONE TABLET BY MOUTH DAILY AS NEEDED FOR ERECTILE DYSFUNCTION 60 tablet 5   No current facility-administered medications for this visit.    No Known Allergies  Social History   Socioeconomic History   Marital status: Married    Spouse name: Not on file   Number of children: Not on file   Years of education: Not on file   Highest education level: Not on file  Occupational History   Not on file  Tobacco Use   Smoking status: Former    Packs/day: 2.00    Years: 5.00    Pack years: 10.00  Types: Cigarettes    Quit date: 76    Years since quitting: 49.4   Smokeless tobacco: Never  Vaping Use   Vaping Use: Never used  Substance and Sexual Activity   Alcohol use: No   Drug use: Never   Sexual activity: Not Currently  Other Topics Concern   Not on file  Social History Narrative   Married, exercise - walks, [employer] [place] [place];  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  Physical Exam: Vitals:   06/05/21 0754  BP: 132/84  Pulse: 61  SpO2: 97%  Weight: 261 lb 6.4 oz (118.6 kg)  Height: '5\' 11"'$  (1.803 m)    GEN- The patient is well appearing, alert and oriented x 3 today.   HEENT: normocephalic, atraumatic; sclera clear, conjunctiva pink; hearing intact; oropharynx clear; neck supple, no JVP Lymph- no cervical lymphadenopathy Lungs- Clear to ausculation bilaterally, normal work of breathing.  No wheezes, rales, rhonchi Heart- Regular rate and rhythm, no murmurs, rubs or gallops, PMI not laterally displaced GI- soft, non-tender, non-distended, bowel sounds present, no hepatosplenomegaly Extremities- no clubbing, cyanosis, or edema; DP/PT/radial pulses 2+ bilaterally MS- no significant deformity or atrophy Skin- warm and dry, no rash or lesion Psych- euthymic mood, full affect Neuro- strength and sensation are intact  EKG is ordered. Personal review of EKG from today shows NSR at 61 bpm  Additional studies reviewed include: Previous EP office notes.   Assessment and Plan:  1. SVT    ORT ablated 2020 with recurrence Previous monitor showed symptoms associated with SVT.  He uses both short and long acting diltiazem as needed to good effect and is overall satisfied with his management.  Encouraged to take his long acting regularly for better control.    2. HTN Stable on current regimen    3. Cardiac Clearance for left total knee replacement.  Zio and Echo unremarkable.  Echo 05/09/2020 LVEF 55-60% No symptoms to indicate updating work up.  He is OK to proceed with low to moderate risk once we receive clearance request.  He is not on [place].   Follow up with Dr. Curt Bears in 6 months with more SVT recently, he would like to explore options after he has had his knee replacement and healed from same.    Shirley Friar, PA-C   06/05/21 8:06 AM

## 2021-06-05 ENCOUNTER — Encounter: Payer: Self-pay | Admitting: Student

## 2021-06-05 ENCOUNTER — Ambulatory Visit (INDEPENDENT_AMBULATORY_CARE_PROVIDER_SITE_OTHER): Payer: Medicare Other | Admitting: Student

## 2021-06-05 VITALS — BP 132/84 | HR 61 | Ht 71.0 in | Wt 261.4 lb

## 2021-06-05 DIAGNOSIS — Z01810 Encounter for preprocedural cardiovascular examination: Secondary | ICD-10-CM

## 2021-06-05 DIAGNOSIS — I10 Essential (primary) hypertension: Secondary | ICD-10-CM

## 2021-06-05 DIAGNOSIS — I2510 Atherosclerotic heart disease of native coronary artery without angina pectoris: Secondary | ICD-10-CM

## 2021-06-05 DIAGNOSIS — I2583 Coronary atherosclerosis due to lipid rich plaque: Secondary | ICD-10-CM | POA: Diagnosis not present

## 2021-06-05 DIAGNOSIS — I471 Supraventricular tachycardia: Secondary | ICD-10-CM

## 2021-06-05 DIAGNOSIS — R002 Palpitations: Secondary | ICD-10-CM

## 2021-06-05 NOTE — Patient Instructions (Signed)
Medication Instructions:  Your physician recommends that you continue on your current medications as directed. Please refer to the Current Medication list given to you today.  *If you need a refill on your cardiac medications before your next appointment, please call your pharmacy*   Lab Work: None If you have labs (blood work) drawn today and your tests are completely normal, you will receive your results only by: MyChart Message (if you have MyChart) OR A paper copy in the mail If you have any lab test that is abnormal or we need to change your treatment, we will call you to review the results.   Follow-Up: At [facility], you and your health needs are our priority.  As part of our continuing mission to provide you with exceptional heart care, we have created designated Provider Care Teams.  These Care Teams include your primary Cardiologist (physician) and Advanced Practice Providers (APPs -  Physician Assistants and Nurse Practitioners) who all work together to provide you with the care you need, when you need it.  Your next appointment:   6 month(s)  The format for your next appointment:   In Person  Provider:   Will Camnitz, MD{   

## 2021-06-19 ENCOUNTER — Telehealth: Payer: Self-pay | Admitting: Medical

## 2021-06-19 NOTE — Telephone Encounter (Signed)
Left message for patient to call back and schedule Medicare Annual Wellness Visit (AWV) either virtually or in office. I left my number for patient to call [phone].   awvi 12/15/17 per [facility]  please schedule at anytime with health coach  This should be a 45 minute visit.   

## 2021-06-22 NOTE — Progress Notes (Signed)
Faxed over clearance form

## 2021-07-19 NOTE — H&P (Signed)
Patient's anticipated LOS is less than 2 midnights, meeting these requirements: - Younger than 15 - Lives within 1 hour of care - Has a competent adult at home to recover with post-op recover - NO history of  - Chronic pain requiring opiods  - Diabetes  - Coronary Artery Disease  - Heart failure  - Heart attack  - Stroke  - DVT/VTE  - Cardiac arrhythmia  - Respiratory Failure/COPD  - Renal failure  - Anemia  - Advanced Liver disease     Bradley Novak is an 71 y.o. male.    Chief Complaint: left knee pain  HPI: Pt is a 71 y.o. male complaining of left knee pain for multiple years. Pain had continually increased since the beginning. X-rays in the clinic show end-stage arthritic changes of the left knee. Pt has tried various conservative treatments which have failed to alleviate their symptoms, including injections and therapy. Various options are discussed with the patient. Risks, benefits and expectations were discussed with the patient. Patient understand the risks, benefits and expectations and wishes to proceed with surgery.   PCP:  Carlena Hurl, PA-C  D/C Plans: Home  PMH: Past Medical History:  Diagnosis Date   Contact lens/glasses fitting    GERD (gastroesophageal reflux disease)    History of chemotherapy    1 yr chemo completed 03/2018- in remission per patient   History of hiatal hernia    Hodgkin's lymphoma ([place]) 09/2017   Hyperlipidemia    Hypertension 2006   Obesity    Pneumonia 09/2017   SVT (supraventricular tachycardia) ([place])    Wears glasses     PSH: Past Surgical History:  Procedure Laterality Date   CARDIAC CATHETERIZATION  1980s   COLONOSCOPY  05/20/2019   Dr.Gupta   IR IMAGING GUIDED PORT INSERTION  11/20/2017   IR REMOVAL TUN ACCESS W/ PORT W/O FL MOD SED  06/02/2018   SIGMOIDOSCOPY  08/21/2019   Dr.Gupta   SVT ABLATION  01/29/2018   SVT ABLATION N/A 01/29/2018   Procedure: SVT ABLATION;  Surgeon: Constance Haw, MD;   Location: [facility];  Service: Cardiovascular;  Laterality: N/A;   WISDOM TOOTH EXTRACTION      Social History:  reports that he quit smoking about 49 years ago. His smoking use included cigarettes. He has a 10.00 pack-year smoking history. He has never used smokeless tobacco. He reports that he does not drink alcohol and does not use drugs. BMI: Estimated body mass index is 36.46 kg/m as calculated from the following:   Height as of 06/05/21: '5\' 11"'$  (1.803 m).   Weight as of 06/05/21: 118.6 kg.  Lab Results  Component Value Date   ALBUMIN 3.9 05/09/2021   Diabetes: Patient does not have a diagnosis of diabetes. Lab Results  Component Value Date   HGBA1C 5.4 05/24/2021     Smoking Status: Social History   Tobacco Use  Smoking Status Former   Packs/day: 2.00   Years: 5.00   Total pack years: 10.00   Types: Cigarettes   Quit date: 1974   Years since quitting: 49.5  Smokeless Tobacco Never   The patient is not currently a tobacco user. Counseling given: Not Answered     Allergies:  No Known Allergies  Medications: No current facility-administered medications for this encounter.   Current Outpatient Medications  Medication Sig Dispense Refill   atorvastatin (LIPITOR) 20 MG tablet TAKE 1 TABLET BY MOUTH DAILY 90 tablet 3   carvedilol (COREG) 6.25  MG tablet Take 1 tablet (6.25 mg total) by mouth 2 (two) times daily. 180 tablet 3   CRANBERRY PO Take 1 capsule by mouth daily.     diltiazem (CARDIZEM CD) 120 MG 24 hr capsule Take 1 capsule (120 mg total) by mouth daily as needed. 90 capsule 3   diltiazem (CARDIZEM) 30 MG tablet Take 1 tablet (30 mg total) by mouth 2 (two) times daily as needed (daily PRN for palpitations). 90 tablet 1   esomeprazole (NEXIUM) 20 MG capsule Take 20 mg by mouth 2 (two) times daily.     finasteride (PROSCAR) 5 MG tablet TAKE ONE TABLET BY MOUTH ONCE DAILY 90 tablet 0   losartan (COZAAR) 100 MG tablet Take 1 tablet (100 mg total) by  mouth daily. 90 tablet 2   nitroGLYCERIN (NITROSTAT) 0.3 MG SL tablet Place 1 tablet (0.3 mg total) under the tongue every 5 (five) minutes as needed for chest pain. 30 tablet 0   tadalafil (CIALIS) 20 MG tablet TAKE ONE TABLET BY MOUTH DAILY AS NEEDED FOR ERECTILE DYSFUNCTION 60 tablet 5    No results found for this or any previous visit (from the past 48 hour(s)). No results found.  ROS: Pain with rom of the left lower extremity  Physical Exam: Alert and oriented 71 y.o. male in no acute distress Cranial nerves 2-12 intact Cervical spine: full rom with no tenderness, nv intact distally Chest: active breath sounds bilaterally, no wheeze rhonchi or rales Heart: regular rate and rhythm, no murmur Abd: non tender non distended with active bowel sounds Hip is stable with rom  Left knee painful rom with crepitus Nv intact distally No rashes or edema distally Antalgic gait  Assessment/Plan Assessment: left knee end stage osteoarthritis  Plan:  Patient will undergo a left total knee replacement by Dr. Veverly Fells at [facility] Risks benefits and expectations were discussed with the patient. Patient understand risks, benefits and expectations and wishes to proceed. Preoperative templating of the joint replacement has been completed, documented, and submitted to the Operating Room personnel in order to optimize intra-operative equipment management.   Merla Riches PA-C, MPAS [facility] is now [facility] [address] [place], [place], [place] [address] Phone: [phone] [email or web address] Facebook  [employer]

## 2021-08-02 NOTE — Progress Notes (Signed)
DUE TO COVID-19 ONLY ONE VISITOR IS ALLOWED TO COME WITH YOU AND STAY IN THE WAITING ROOM ONLY DURING PRE OP AND PROCEDURE DAY OF SURGERY.  2 VISITOR  MAY VISIT WITH YOU AFTER SURGERY IN YOUR PRIVATE ROOM DURING VISITING HOURS ONLY! YOU MAY HAVE ONE PERSON SPEND THE NITE WITH YOU IN YOUR ROOM AFTER SURGERY.     Your procedure is scheduled on:        08/11/21   Report to [facility] Main  Entrance   Report to admitting at     0515            AM DO NOT [place], PICTURE ID OR WALLET DAY OF SURGERY.      Call this number if you have problems the morning of surgery [phone]    REMEMBER: NO  SOLID FOODS , CANDY, GUM OR MINTS AFTER [place] .       [facility] Kitchen CLEAR LIQUIDS UNTIL    0415am             DAY OF SURGERY.      PLEASE FINISH ENSURE DRINK PER SURGEON ORDER  WHICH NEEDS TO BE COMPLETED AT     0415am      MORNING OF SURGERY.       CLEAR LIQUID DIET   Foods Allowed      WATER BLACK COFFEE ( SUGAR OK, NO MILK, CREAM OR CREAMER) REGULAR AND DECAF  TEA ( SUGAR OK NO MILK, CREAM, OR CREAMER) REGULAR AND DECAF  PLAIN JELLO ( NO RED)  FRUIT ICES ( NO RED, NO FRUIT PULP)  POPSICLES ( NO RED)  JUICE- APPLE, WHITE GRAPE AND WHITE CRANBERRY  SPORT DRINK LIKE GATORADE ( NO RED)  CLEAR BROTH ( VEGETABLE , CHICKEN OR BEEF)                                                                     BRUSH YOUR TEETH MORNING OF SURGERY AND RINSE YOUR MOUTH OUT, NO CHEWING GUM CANDY OR MINTS.     Take these medicines the morning of surgery with A SIP OF WATER:  coreg, cardizem if needed, nexium, proscar    DO NOT TAKE ANY DIABETIC MEDICATIONS DAY OF YOUR SURGERY                               You may not have any metal on your body including hair pins and              piercings  Do not wear jewelry, make-up, lotions, powders or perfumes, deodorant             Do not wear nail polish on your fingernails.              IF YOU ARE A MALE AND WANT TO SHAVE UNDER  ARMS OR LEGS PRIOR TO SURGERY YOU MUST DO SO AT LEAST 48 HOURS PRIOR TO SURGERY.              Men may shave face and neck.   Do not bring valuables to the hospital. [facility] IS NOT  RESPONSIBLE   FOR VALUABLES.  Contacts, dentures or bridgework may not be worn into surgery.  Leave suitcase in the car. After surgery it may be brought to your room.     Patients discharged the day of surgery will not be allowed to drive home. IF YOU ARE HAVING SURGERY AND GOING HOME THE SAME DAY, YOU MUST HAVE AN ADULT TO DRIVE YOU HOME AND BE WITH YOU FOR 24 HOURS. YOU MAY GO HOME BY TAXI OR UBER OR ORTHERWISE, BUT AN ADULT MUST ACCOMPANY YOU HOME AND STAY WITH YOU FOR 24 HOURS.                Please read over the following fact sheets you were given: _____________________________________________________________________  [facility] - Preparing for Surgery Before surgery, you can play an important role.  Because skin is not sterile, your skin needs to be as free of germs as possible.  You can reduce the number of germs on your skin by washing with CHG (chlorahexidine gluconate) soap before surgery.  CHG is an antiseptic cleaner which kills germs and bonds with the skin to continue killing germs even after washing. Please DO NOT use if you have an allergy to CHG or antibacterial soaps.  If your skin becomes reddened/irritated stop using the CHG and inform your nurse when you arrive at Short Stay. Do not shave (including legs and underarms) for at least 48 hours prior to the first CHG shower.  You may shave your face/neck. Please follow these instructions carefully:  1.  Shower with CHG Soap the night before surgery and the  morning of Surgery.  2.  If you choose to wash your hair, wash your hair first as usual with your  normal  shampoo.  3.  After you shampoo, rinse your hair and body thoroughly to remove the  shampoo.                           4.  Use CHG as you would any other liquid soap.  You  can apply chg directly  to the skin and wash                       Gently with a scrungie or clean washcloth.  5.  Apply the CHG Soap to your body ONLY FROM THE NECK DOWN.   Do not use on face/ open                           Wound or open sores. Avoid contact with eyes, ears mouth and genitals (private parts).                       Wash face,  Genitals (private parts) with your normal soap.             6.  Wash thoroughly, paying special attention to the area where your surgery  will be performed.  7.  Thoroughly rinse your body with warm water from the neck down.  8.  DO NOT shower/wash with your normal soap after using and rinsing off  the CHG Soap.                9.  Pat yourself dry with a clean towel.            10.  Wear clean pajamas.  11.  Place clean sheets on your bed the night of your first shower and do not  sleep with pets. Day of Surgery : Do not apply any lotions/deodorants the morning of surgery.  Please wear clean clothes to the hospital/surgery center.  FAILURE TO FOLLOW THESE INSTRUCTIONS MAY RESULT IN THE CANCELLATION OF YOUR SURGERY PATIENT SIGNATURE_________________________________  NURSE SIGNATURE__________________________________  ________________________________________________________________________

## 2021-08-02 NOTE — Progress Notes (Addendum)
Anesthesia Review:  PCP:  Dorothea Ogle , PAC  PReop exam on 05/24/21.  Cardiologist : DR Acie Fredrickson- card EP- DR Curt Bears  Merwyn Katos 06/05/21  Onc- Dr Martha Clan- [place] 05/09/21  Ablation 01/29/2018  Chest x-ray : EKG : 06/05/21 Echo : 05/09/20  Monitor- 04/25/20  Stress test: Cardiac Cath :  Activity level: can do a flight of stairs without difficutly  Sleep Study/ CPAP : none  Fasting Blood Sugar :      / Checks Blood Sugar -- times a day:   Blood Thinner/ Instructions /Last Dose: ASA / Instructions/ Last Dose :   Blood pressure was 170/95 at preop appt.  PT denies any chest pain, dizziness, headache or shortness of breath of blurred vision. PT states he has not taken blood pressure meds this am.  Instructed pt to go home and take blood pressure meds this am and to monitor blood pressure.  PT voiced understanding.

## 2021-08-07 ENCOUNTER — Encounter (HOSPITAL_COMMUNITY): Payer: Self-pay

## 2021-08-07 ENCOUNTER — Other Ambulatory Visit: Payer: Self-pay

## 2021-08-07 ENCOUNTER — Encounter (HOSPITAL_COMMUNITY)
Admission: RE | Admit: 2021-08-07 | Discharge: 2021-08-07 | Disposition: A | Discharge status: Home / Self Care | Payer: Medicare Other | Source: Ambulatory Visit | Attending: Orthopedic Surgery | Admitting: Orthopedic Surgery

## 2021-08-07 VITALS — BP 170/95 | HR 59 | Temp 97.9°F | Resp 16 | Ht 70.0 in | Wt 257.0 lb

## 2021-08-07 DIAGNOSIS — Z87891 Personal history of nicotine dependence: Secondary | ICD-10-CM | POA: Diagnosis not present

## 2021-08-07 DIAGNOSIS — K219 Gastro-esophageal reflux disease without esophagitis: Secondary | ICD-10-CM | POA: Insufficient documentation

## 2021-08-07 DIAGNOSIS — Z8571 Personal history of Hodgkin lymphoma: Secondary | ICD-10-CM | POA: Diagnosis not present

## 2021-08-07 DIAGNOSIS — M1712 Unilateral primary osteoarthritis, left knee: Secondary | ICD-10-CM | POA: Insufficient documentation

## 2021-08-07 DIAGNOSIS — Z01818 Encounter for other preprocedural examination: Secondary | ICD-10-CM | POA: Diagnosis not present

## 2021-08-07 DIAGNOSIS — I10 Essential (primary) hypertension: Secondary | ICD-10-CM | POA: Insufficient documentation

## 2021-08-07 HISTORY — DX: Cardiac arrhythmia, unspecified: I49.9

## 2021-08-07 LAB — CBC
HCT: 41 % (ref 39.0–52.0)
Hemoglobin: 13.5 g/dL (ref 13.0–17.0)
MCH: 30.3 pg (ref 26.0–34.0)
MCHC: 32.9 g/dL (ref 30.0–36.0)
MCV: 91.9 fL (ref 80.0–100.0)
Platelets: 211 10*3/uL (ref 150–400)
RBC: 4.46 MIL/uL (ref 4.22–5.81)
RDW: 12.2 % (ref 11.5–15.5)
WBC: 9.4 10*3/uL (ref 4.0–10.5)
nRBC: 0 % (ref 0.0–0.2)

## 2021-08-07 LAB — BASIC METABOLIC PANEL
Anion gap: 8 (ref 5–15)
BUN: 23 mg/dL (ref 8–23)
CO2: 28 mmol/L (ref 22–32)
Calcium: 8.8 mg/dL — ABNORMAL LOW (ref 8.9–10.3)
Chloride: 103 mmol/L (ref 98–111)
Creatinine, Ser: 1.11 mg/dL (ref 0.61–1.24)
GFR, Estimated: >60 mL/min (ref >60)
Glucose, Bld: 113 mg/dL — ABNORMAL HIGH (ref 70–99)
Potassium: 3.7 mmol/L (ref 3.5–5.1)
Sodium: 139 mmol/L (ref 135–145)

## 2021-08-07 LAB — SURGICAL PCR SCREEN
MRSA, PCR: NEGATIVE
Staphylococcus aureus: POSITIVE — AB

## 2021-08-08 NOTE — Progress Notes (Signed)
Anesthesia Chart Review   Case: [id number] Date/Time: 08/11/21 0700   Procedure: TOTAL KNEE ARTHROPLASTY (Left: Knee)   Anesthesia type: Spinal   Pre-op diagnosis: Arthritis of left knee   Location: [facility] ROOM 06 / [facility]   Surgeons: Netta Cedars, MD       DISCUSSION:70 y.o. former smoker with h/o HTN, GERD, hodgkin's lymphoma in remission, SVT s/p ablation 2020 with recurrence, left knee OA scheduled for above procedure 08/08/2021 with Dr. Netta Cedars.   Pt last seen by EP 06/05/2021. Per OV note, "Zio and Echo unremarkable.  Echo 05/09/2020 LVEF 55-60% No symptoms to indicate updating work up.  He is OK to proceed with low to moderate risk once we receive clearance request.  He is not on [place]. "  Anticipate pt can proceed with planned procedure barring acute status change.   VS: BP (!) 170/95   Pulse (!) 59   Temp 36.6 C (Oral)   Resp 16   Ht '5\' 10"'$  (1.778 m)   Wt 116.6 kg   SpO2 98%   BMI 36.88 kg/m   PROVIDERS: Tysinger, Camelia Eng, PA-C is PCP   Primary Cardiologist: Mertie Moores, MD  Electrophysiologist: Will Meredith Leeds, MD  LABS: Labs reviewed: Acceptable for surgery. (all labs ordered are listed, but only abnormal results are displayed)  Labs Reviewed  SURGICAL PCR SCREEN - Abnormal; Notable for the following components:      Result Value   Staphylococcus aureus POSITIVE (*)    All other components within normal limits  BASIC METABOLIC PANEL - Abnormal; Notable for the following components:   Glucose, Bld 113 (*)    Calcium 8.8 (*)    All other components within normal limits  CBC     IMAGES:   EKG: 06/05/21 Rate 61 bpm  NSR  CV: Echo 05/09/2020 1. Left ventricular ejection fraction, by estimation, is 55 to 60%. The  left ventricle has normal function. The left ventricle has no regional  wall motion abnormalities. There is mild left ventricular hypertrophy.  Left ventricular diastolic parameters  are indeterminate.   2. Right ventricular systolic  function is normal. The right ventricular  size is normal. Tricuspid regurgitation signal is inadequate for assessing  PA pressure.   3. The mitral valve is normal in structure. No evidence of mitral valve  regurgitation. No evidence of mitral stenosis.   4. The aortic valve is tricuspid. Aortic valve regurgitation is not  visualized. Mild aortic valve sclerosis is present, with no evidence of  aortic valve stenosis.   5. Aortic dilatation noted. There is mild dilatation of the ascending  aorta, measuring 40 mm.  Past Medical History:  Diagnosis Date   Contact lens/glasses fitting    Dysrhythmia    GERD (gastroesophageal reflux disease)    History of chemotherapy    1 yr chemo completed 03/2018- in remission per patient   History of hiatal hernia    Hodgkin's lymphoma ([place]) 09/2017   Hyperlipidemia    Hypertension 2006   Obesity    Pneumonia 09/2017   SVT (supraventricular tachycardia) ([place])    Wears glasses     Past Surgical History:  Procedure Laterality Date   CARDIAC CATHETERIZATION  1980s   COLONOSCOPY  05/20/2019   Dr.Gupta   IR IMAGING GUIDED PORT INSERTION  11/20/2017   IR REMOVAL TUN ACCESS W/ PORT W/O FL MOD SED  06/02/2018   PORT-A-CATH REMOVAL     SIGMOIDOSCOPY  08/21/2019   Dr.Gupta   SVT ABLATION  01/29/2018   SVT ABLATION N/A 01/29/2018   Procedure: SVT ABLATION;  Surgeon: Constance Haw, MD;  Location: [place] [facility];  Service: Cardiovascular;  Laterality: N/A;   WISDOM TOOTH EXTRACTION      MEDICATIONS:  atorvastatin (LIPITOR) 20 MG tablet   carvedilol (COREG) 6.25 MG tablet   CRANBERRY PO   diltiazem (CARDIZEM CD) 120 MG 24 hr capsule   diltiazem (CARDIZEM) 30 MG tablet   esomeprazole (NEXIUM) 20 MG capsule   finasteride (PROSCAR) 5 MG tablet   losartan (COZAAR) 100 MG tablet   nitroGLYCERIN (NITROSTAT) 0.3 MG SL tablet   tadalafil (CIALIS) 20 MG tablet   No current facility-administered medications for this encounter.    Bradley Felix Ward, PA-C [facility] Pre-Surgical Testing [phone]

## 2021-08-08 NOTE — Anesthesia Preprocedure Evaluation (Addendum)
Anesthesia Evaluation  Patient identified by MRN, date of birth, ID band Patient awake    Reviewed: Allergy & Precautions, NPO status , Patient's Chart, lab work & pertinent test results, reviewed documented beta blocker date and time   Airway Mallampati: II  TM Distance: >3 FB Neck ROM: Full    Dental no notable dental hx.    Pulmonary neg pulmonary ROS, former smoker,  Quit smoking 1974, 10 pack year history    Pulmonary exam normal breath sounds clear to auscultation       Cardiovascular hypertension, Pt. on medications and Pt. on home beta blockers + angina + CAD and + Peripheral Vascular Disease  Normal cardiovascular exam+ dysrhythmias (SVT s/p ablation 2020) Supra Ventricular Tachycardia  Rhythm:Regular Rate:Normal  Pt last seen by EP 06/05/2021. Per OV note, "Zio and Echo unremarkable. Echo 05/09/2020 LVEF 55-60%   Neuro/Psych negative neurological ROS  negative psych ROS   GI/Hepatic negative GI ROS, Neg liver ROS, hiatal hernia, GERD  Medicated and Controlled,  Endo/Other  negative endocrine ROSObesity BMI 37  Renal/GU negative Renal ROS  negative genitourinary   Musculoskeletal negative musculoskeletal ROS (+) Arthritis , Osteoarthritis,    Abdominal (+) + obese,   Peds negative pediatric ROS (+)  Hematology negative hematology ROS (+) hodgkins lymphoma in remission- 1 year of chemo completed in 2020 Hb 13.5, plt 211   Anesthesia Other Findings   Reproductive/Obstetrics negative OB ROS                           Anesthesia Physical Anesthesia Plan  ASA: 3  Anesthesia Plan: Spinal, Regional and MAC   Post-op Pain Management: Regional block* and Tylenol PO (pre-op)*   Induction: Intravenous  PONV Risk Score and Plan: 1 and Propofol infusion and Treatment may vary due to age or medical condition  Airway Management Planned: Simple Face Mask  Additional Equipment:  None  Intra-op Plan:   Post-operative Plan:   Informed Consent:   Plan Discussed with:   Anesthesia Plan Comments:       Anesthesia Quick Evaluation

## 2021-08-10 ENCOUNTER — Telehealth: Payer: Self-pay | Admitting: Medical

## 2021-08-10 NOTE — Telephone Encounter (Signed)
Left message for patient to call back and schedule Medicare Annual Wellness Visit (AWV) either virtually or in office. I left my number for patient to call [phone].   awvi 12/15/17 per [facility]  please schedule at anytime with health coach  This should be a 45 minute visit.

## 2021-08-11 ENCOUNTER — Other Ambulatory Visit: Payer: Self-pay

## 2021-08-11 ENCOUNTER — Ambulatory Visit (HOSPITAL_COMMUNITY): Payer: Medicare Other | Admitting: Physician Assistant

## 2021-08-11 ENCOUNTER — Encounter (HOSPITAL_COMMUNITY): Payer: Self-pay | Admitting: Orthopedic Surgery

## 2021-08-11 ENCOUNTER — Ambulatory Visit (HOSPITAL_BASED_OUTPATIENT_CLINIC_OR_DEPARTMENT_OTHER): Payer: Medicare Other | Admitting: Certified Registered Nurse Anesthetist

## 2021-08-11 ENCOUNTER — Observation Stay (HOSPITAL_COMMUNITY)
Admission: RE | Admit: 2021-08-11 | Discharge: 2021-08-12 | Disposition: A | Discharge status: Home / Self Care | Payer: Medicare Other | Attending: Orthopedic Surgery | Admitting: Orthopedic Surgery

## 2021-08-11 ENCOUNTER — Encounter (HOSPITAL_COMMUNITY)
Admission: RE | Disposition: A | Discharge status: Home / Self Care | Payer: Self-pay | Source: Home / Self Care | Attending: Orthopedic Surgery

## 2021-08-11 DIAGNOSIS — Z87891 Personal history of nicotine dependence: Secondary | ICD-10-CM | POA: Insufficient documentation

## 2021-08-11 DIAGNOSIS — M1712 Unilateral primary osteoarthritis, left knee: Principal | ICD-10-CM | POA: Insufficient documentation

## 2021-08-11 DIAGNOSIS — I10 Essential (primary) hypertension: Secondary | ICD-10-CM | POA: Insufficient documentation

## 2021-08-11 DIAGNOSIS — Z96652 Presence of left artificial knee joint: Secondary | ICD-10-CM

## 2021-08-11 DIAGNOSIS — G8918 Other acute postprocedural pain: Secondary | ICD-10-CM | POA: Diagnosis not present

## 2021-08-11 DIAGNOSIS — Z79899 Other long term (current) drug therapy: Secondary | ICD-10-CM | POA: Insufficient documentation

## 2021-08-11 DIAGNOSIS — Z8571 Personal history of Hodgkin lymphoma: Secondary | ICD-10-CM | POA: Diagnosis not present

## 2021-08-11 DIAGNOSIS — I25119 Atherosclerotic heart disease of native coronary artery with unspecified angina pectoris: Secondary | ICD-10-CM

## 2021-08-11 DIAGNOSIS — Z01818 Encounter for other preprocedural examination: Secondary | ICD-10-CM

## 2021-08-11 HISTORY — PX: TOTAL KNEE ARTHROPLASTY: SHX125

## 2021-08-11 SURGERY — ARTHROPLASTY, KNEE, TOTAL
Anesthesia: Monitor Anesthesia Care | Site: Knee | Laterality: Left

## 2021-08-11 MED ORDER — BUPIVACAINE-EPINEPHRINE (PF) 0.25% -1:200000 IJ SOLN
INTRAMUSCULAR | Status: AC
  Filled 2021-08-11: qty 30

## 2021-08-11 MED ORDER — ACETAMINOPHEN 500 MG PO TABS
1000.0000 mg | ORAL_TABLET | Freq: Once | ORAL | Status: AC
  Administered 2021-08-11: 1000 mg via ORAL
  Filled 2021-08-11: qty 2

## 2021-08-11 MED ORDER — OXYCODONE HCL 5 MG PO TABS
5.0000 mg | ORAL_TABLET | ORAL | Status: DC | PRN
  Administered 2021-08-11 – 2021-08-12 (×3): 10 mg via ORAL
  Filled 2021-08-11 (×3): qty 2

## 2021-08-11 MED ORDER — METHOCARBAMOL 500 MG PO TABS
ORAL_TABLET | ORAL | Status: AC
  Filled 2021-08-11: qty 1

## 2021-08-11 MED ORDER — METHOCARBAMOL 500 MG IVPB - SIMPLE MED: 500.0000 mg | Freq: Four times a day (QID) | INTRAVENOUS | Status: DC | PRN

## 2021-08-11 MED ORDER — FENTANYL CITRATE (PF) 100 MCG/2ML IJ SOLN
INTRAMUSCULAR | Status: AC
  Filled 2021-08-11: qty 2

## 2021-08-11 MED ORDER — ONDANSETRON HCL 4 MG/2ML IJ SOLN: 4.0000 mg | Freq: Four times a day (QID) | INTRAMUSCULAR | Status: DC | PRN

## 2021-08-11 MED ORDER — PROPOFOL 1000 MG/100ML IV EMUL
INTRAVENOUS | Status: AC
  Filled 2021-08-11: qty 100

## 2021-08-11 MED ORDER — DOCUSATE SODIUM 100 MG PO CAPS
100.0000 mg | ORAL_CAPSULE | Freq: Two times a day (BID) | ORAL | Status: DC
  Administered 2021-08-11 – 2021-08-12 (×2): 100 mg via ORAL
  Filled 2021-08-11 (×2): qty 1

## 2021-08-11 MED ORDER — ATORVASTATIN CALCIUM 20 MG PO TABS
20.0000 mg | ORAL_TABLET | Freq: Every day | ORAL | Status: DC
  Administered 2021-08-11 – 2021-08-12 (×2): 20 mg via ORAL
  Filled 2021-08-11 (×2): qty 1

## 2021-08-11 MED ORDER — ONDANSETRON HCL 4 MG/2ML IJ SOLN
INTRAMUSCULAR | Status: AC
  Filled 2021-08-11: qty 2

## 2021-08-11 MED ORDER — POLYETHYLENE GLYCOL 3350 17 G PO PACK: 17.0000 g | PACK | Freq: Every day | ORAL | Status: DC | PRN

## 2021-08-11 MED ORDER — METOCLOPRAMIDE HCL 5 MG/ML IJ SOLN: 5.0000 mg | Freq: Three times a day (TID) | INTRAMUSCULAR | Status: DC | PRN

## 2021-08-11 MED ORDER — SODIUM CHLORIDE 0.9 % IV SOLN
INTRAVENOUS | Status: DC
Start: 2021-08-11 — End: 2021-08-12

## 2021-08-11 MED ORDER — ONDANSETRON HCL 4 MG PO TABS: 4.0000 mg | ORAL_TABLET | Freq: Four times a day (QID) | ORAL | Status: DC | PRN

## 2021-08-11 MED ORDER — SODIUM CHLORIDE (PF) 0.9 % IJ SOLN
INTRAMUSCULAR | Status: AC
  Filled 2021-08-11: qty 30

## 2021-08-11 MED ORDER — CRANBERRY 400 MG PO CAPS: ORAL_CAPSULE | Freq: Every day | ORAL | Status: DC

## 2021-08-11 MED ORDER — BUPIVACAINE LIPOSOME 1.3 % IJ SUSP
INTRAMUSCULAR | Status: AC
  Filled 2021-08-11: qty 20

## 2021-08-11 MED ORDER — PROMETHAZINE HCL 25 MG/ML IJ SOLN: 6.2500 mg | INTRAMUSCULAR | Status: DC | PRN

## 2021-08-11 MED ORDER — ORAL CARE MOUTH RINSE: 15.0000 mL | Freq: Once | OROMUCOSAL | Status: AC

## 2021-08-11 MED ORDER — ACETAMINOPHEN 325 MG PO TABS
325.0000 mg | ORAL_TABLET | Freq: Four times a day (QID) | ORAL | Status: DC | PRN
  Administered 2021-08-11 – 2021-08-12 (×2): 650 mg via ORAL
  Filled 2021-08-11 (×3): qty 2

## 2021-08-11 MED ORDER — PROPOFOL 10 MG/ML IV BOLUS
INTRAVENOUS | Status: DC | PRN
  Administered 2021-08-11 (×2): 20 mg via INTRAVENOUS

## 2021-08-11 MED ORDER — PHENYLEPHRINE HCL-NACL 20-0.9 MG/250ML-% IV SOLN
INTRAVENOUS | Status: DC | PRN
  Administered 2021-08-11: 20 ug/min via INTRAVENOUS

## 2021-08-11 MED ORDER — SODIUM CHLORIDE 0.9 % IR SOLN
Status: DC | PRN
  Administered 2021-08-11: 1000 mL

## 2021-08-11 MED ORDER — BUPIVACAINE LIPOSOME 1.3 % IJ SUSP
INTRAMUSCULAR | Status: DC | PRN
  Administered 2021-08-11: 20 mL

## 2021-08-11 MED ORDER — ROPIVACAINE HCL 5 MG/ML IJ SOLN
INTRAMUSCULAR | Status: DC | PRN
  Administered 2021-08-11: 20 mL via PERINEURAL

## 2021-08-11 MED ORDER — HYDROMORPHONE HCL 1 MG/ML IJ SOLN: 0.2500 mg | INTRAMUSCULAR | Status: DC | PRN

## 2021-08-11 MED ORDER — TRANEXAMIC ACID-NACL 1000-0.7 MG/100ML-% IV SOLN
1000.0000 mg | INTRAVENOUS | Status: AC
  Administered 2021-08-11: 1000 mg via INTRAVENOUS
  Filled 2021-08-11: qty 100

## 2021-08-11 MED ORDER — HYDROMORPHONE HCL 1 MG/ML IJ SOLN
0.5000 mg | INTRAMUSCULAR | Status: DC | PRN
  Administered 2021-08-11 – 2021-08-12 (×3): 1 mg via INTRAVENOUS
  Filled 2021-08-11 (×3): qty 1

## 2021-08-11 MED ORDER — OXYCODONE HCL 5 MG PO TABS: 5.0000 mg | ORAL_TABLET | Freq: Once | ORAL | Status: DC | PRN

## 2021-08-11 MED ORDER — CARVEDILOL 6.25 MG PO TABS
6.2500 mg | ORAL_TABLET | Freq: Two times a day (BID) | ORAL | Status: DC
  Administered 2021-08-11 – 2021-08-12 (×2): 6.25 mg via ORAL
  Filled 2021-08-11 (×2): qty 1

## 2021-08-11 MED ORDER — OXYCODONE HCL 5 MG PO TABS
10.0000 mg | ORAL_TABLET | ORAL | Status: DC | PRN
  Administered 2021-08-12 (×2): 15 mg via ORAL
  Filled 2021-08-11 (×2): qty 3

## 2021-08-11 MED ORDER — NITROGLYCERIN 0.4 MG SL SUBL: 0.4000 mg | SUBLINGUAL_TABLET | SUBLINGUAL | Status: DC | PRN

## 2021-08-11 MED ORDER — OXYCODONE HCL 5 MG/5ML PO SOLN: 5.0000 mg | Freq: Once | ORAL | Status: DC | PRN

## 2021-08-11 MED ORDER — PROPOFOL 10 MG/ML IV BOLUS
INTRAVENOUS | Status: AC
Start: 2021-08-11 — End: ?
  Filled 2021-08-11: qty 20

## 2021-08-11 MED ORDER — LOSARTAN POTASSIUM 50 MG PO TABS
100.0000 mg | ORAL_TABLET | Freq: Every day | ORAL | Status: DC
  Administered 2021-08-11 – 2021-08-12 (×2): 100 mg via ORAL
  Filled 2021-08-11 (×2): qty 2

## 2021-08-11 MED ORDER — BUPIVACAINE-EPINEPHRINE 0.25% -1:200000 IJ SOLN
INTRAMUSCULAR | Status: DC | PRN
  Administered 2021-08-11: 30 mL

## 2021-08-11 MED ORDER — CEFAZOLIN SODIUM-DEXTROSE 2-4 GM/100ML-% IV SOLN
2.0000 g | Freq: Four times a day (QID) | INTRAVENOUS | Status: AC
  Administered 2021-08-11 (×2): 2 g via INTRAVENOUS
  Filled 2021-08-11 (×2): qty 100

## 2021-08-11 MED ORDER — METOCLOPRAMIDE HCL 5 MG PO TABS: 5.0000 mg | ORAL_TABLET | Freq: Three times a day (TID) | ORAL | Status: DC | PRN

## 2021-08-11 MED ORDER — METHOCARBAMOL 750 MG PO TABS: 750.0000 mg | ORAL_TABLET | Freq: Three times a day (TID) | ORAL | 1 refills | Status: DC | PRN

## 2021-08-11 MED ORDER — MIDAZOLAM HCL 5 MG/5ML IJ SOLN
INTRAMUSCULAR | Status: DC | PRN
  Administered 2021-08-11: 1 mg via INTRAVENOUS
  Administered 2021-08-11: .5 mg via INTRAVENOUS

## 2021-08-11 MED ORDER — METHOCARBAMOL 500 MG PO TABS
500.0000 mg | ORAL_TABLET | Freq: Four times a day (QID) | ORAL | Status: DC | PRN
  Administered 2021-08-11 – 2021-08-12 (×5): 500 mg via ORAL
  Filled 2021-08-11 (×4): qty 1

## 2021-08-11 MED ORDER — PHENYLEPHRINE HCL-NACL 20-0.9 MG/250ML-% IV SOLN
INTRAVENOUS | Status: AC
  Filled 2021-08-11: qty 500

## 2021-08-11 MED ORDER — PROPOFOL 500 MG/50ML IV EMUL
INTRAVENOUS | Status: DC | PRN
  Administered 2021-08-11: 75 ug/kg/min via INTRAVENOUS

## 2021-08-11 MED ORDER — CEFAZOLIN SODIUM-DEXTROSE 2-4 GM/100ML-% IV SOLN
2.0000 g | INTRAVENOUS | Status: AC
Start: 2021-08-11 — End: 2021-08-11
  Administered 2021-08-11: 2 g via INTRAVENOUS
  Filled 2021-08-11: qty 100

## 2021-08-11 MED ORDER — PROPOFOL 500 MG/50ML IV EMUL
INTRAVENOUS | Status: AC
  Filled 2021-08-11: qty 50

## 2021-08-11 MED ORDER — ONDANSETRON HCL 4 MG PO TABS: 4.0000 mg | ORAL_TABLET | Freq: Every day | ORAL | 1 refills | Status: DC | PRN

## 2021-08-11 MED ORDER — CHLORHEXIDINE GLUCONATE 0.12 % MT SOLN
15.0000 mL | Freq: Once | OROMUCOSAL | Status: AC
  Administered 2021-08-11: 15 mL via OROMUCOSAL

## 2021-08-11 MED ORDER — OXYCODONE HCL 5 MG/5ML PO SOLN: 5.0000 mg | Freq: Once | ORAL | Status: AC | PRN

## 2021-08-11 MED ORDER — AMISULPRIDE (ANTIEMETIC) 5 MG/2ML IV SOLN: 10.0000 mg | Freq: Once | INTRAVENOUS | Status: DC | PRN

## 2021-08-11 MED ORDER — PHENOL 1.4 % MT LIQD: 1.0000 | OROMUCOSAL | Status: DC | PRN

## 2021-08-11 MED ORDER — FENTANYL CITRATE (PF) 100 MCG/2ML IJ SOLN
INTRAMUSCULAR | Status: DC | PRN
  Administered 2021-08-11: 25 ug via INTRAVENOUS

## 2021-08-11 MED ORDER — ASPIRIN 81 MG PO CHEW
81.0000 mg | CHEWABLE_TABLET | Freq: Two times a day (BID) | ORAL | Status: DC
  Administered 2021-08-11 – 2021-08-12 (×2): 81 mg via ORAL
  Filled 2021-08-11 (×2): qty 1

## 2021-08-11 MED ORDER — BISACODYL 10 MG RE SUPP: 10.0000 mg | Freq: Every day | RECTAL | Status: DC | PRN

## 2021-08-11 MED ORDER — OXYCODONE-ACETAMINOPHEN 5-325 MG PO TABS: 1.0000 | ORAL_TABLET | ORAL | 0 refills | Status: DC | PRN

## 2021-08-11 MED ORDER — OXYCODONE HCL 5 MG PO TABS
ORAL_TABLET | ORAL | Status: AC
  Filled 2021-08-11: qty 1

## 2021-08-11 MED ORDER — ASPIRIN 81 MG PO CHEW: 81.0000 mg | CHEWABLE_TABLET | Freq: Two times a day (BID) | ORAL | 0 refills | Status: DC

## 2021-08-11 MED ORDER — TRANEXAMIC ACID-NACL 1000-0.7 MG/100ML-% IV SOLN
1000.0000 mg | Freq: Once | INTRAVENOUS | Status: AC
  Administered 2021-08-11: 1000 mg via INTRAVENOUS
  Filled 2021-08-11: qty 100

## 2021-08-11 MED ORDER — BUPIVACAINE IN DEXTROSE 0.75-8.25 % IT SOLN
INTRATHECAL | Status: DC | PRN
  Administered 2021-08-11: 2 mL via INTRATHECAL

## 2021-08-11 MED ORDER — DILTIAZEM HCL 30 MG PO TABS
30.0000 mg | ORAL_TABLET | Freq: Two times a day (BID) | ORAL | Status: DC | PRN
  Filled 2021-08-11: qty 1

## 2021-08-11 MED ORDER — BUPIVACAINE LIPOSOME 1.3 % IJ SUSP: 20.0000 mL | Freq: Once | INTRAMUSCULAR | Status: DC

## 2021-08-11 MED ORDER — ONDANSETRON HCL 4 MG/2ML IJ SOLN
4.0000 mg | Freq: Once | INTRAMUSCULAR | Status: DC | PRN
Start: 2021-08-11 — End: 2021-08-11

## 2021-08-11 MED ORDER — 0.9 % SODIUM CHLORIDE (POUR BTL) OPTIME
TOPICAL | Status: DC | PRN
  Administered 2021-08-11: 1000 mL

## 2021-08-11 MED ORDER — WATER FOR IRRIGATION, STERILE IR SOLN
Status: DC | PRN
  Administered 2021-08-11: 2000 mL

## 2021-08-11 MED ORDER — MIDAZOLAM HCL 2 MG/2ML IJ SOLN
INTRAMUSCULAR | Status: AC
  Filled 2021-08-11: qty 2

## 2021-08-11 MED ORDER — MENTHOL 3 MG MT LOZG: 1.0000 | LOZENGE | OROMUCOSAL | Status: DC | PRN

## 2021-08-11 MED ORDER — FINASTERIDE 5 MG PO TABS
5.0000 mg | ORAL_TABLET | Freq: Every day | ORAL | Status: DC
  Administered 2021-08-12: 5 mg via ORAL
  Filled 2021-08-11: qty 1

## 2021-08-11 MED ORDER — EPHEDRINE SULFATE-NACL 50-0.9 MG/10ML-% IV SOSY
PREFILLED_SYRINGE | INTRAVENOUS | Status: DC | PRN
  Administered 2021-08-11: 5 mg via INTRAVENOUS
  Administered 2021-08-11: 10 mg via INTRAVENOUS
  Administered 2021-08-11 (×2): 5 mg via INTRAVENOUS

## 2021-08-11 MED ORDER — LACTATED RINGERS IV SOLN: INTRAVENOUS | Status: DC

## 2021-08-11 MED ORDER — PANTOPRAZOLE SODIUM 40 MG PO TBEC
40.0000 mg | DELAYED_RELEASE_TABLET | Freq: Every day | ORAL | Status: DC
  Administered 2021-08-12: 40 mg via ORAL
  Filled 2021-08-11: qty 1

## 2021-08-11 MED ORDER — PHENYLEPHRINE 80 MCG/ML (10ML) SYRINGE FOR IV PUSH (FOR BLOOD PRESSURE SUPPORT)
PREFILLED_SYRINGE | INTRAVENOUS | Status: DC | PRN
  Administered 2021-08-11 (×2): 40 ug via INTRAVENOUS

## 2021-08-11 MED ORDER — POVIDONE-IODINE 10 % EX SWAB
2.0000 | Freq: Once | CUTANEOUS | Status: AC
Start: 2021-08-11 — End: 2021-08-11
  Administered 2021-08-11: 2 via TOPICAL

## 2021-08-11 MED ORDER — ONDANSETRON HCL 4 MG/2ML IJ SOLN
INTRAMUSCULAR | Status: DC | PRN
  Administered 2021-08-11: 4 mg via INTRAVENOUS

## 2021-08-11 MED ORDER — DILTIAZEM HCL ER COATED BEADS 120 MG PO CP24
120.0000 mg | ORAL_CAPSULE | Freq: Every day | ORAL | Status: DC | PRN
  Filled 2021-08-11: qty 1

## 2021-08-11 MED ORDER — OXYCODONE HCL 5 MG PO TABS
5.0000 mg | ORAL_TABLET | Freq: Once | ORAL | Status: AC | PRN
  Administered 2021-08-11: 5 mg via ORAL

## 2021-08-11 MED ORDER — DEXAMETHASONE SODIUM PHOSPHATE 10 MG/ML IJ SOLN
INTRAMUSCULAR | Status: DC | PRN
  Administered 2021-08-11: 5 mg via INTRAVENOUS

## 2021-08-11 MED ORDER — DEXAMETHASONE SODIUM PHOSPHATE 10 MG/ML IJ SOLN
INTRAMUSCULAR | Status: AC
  Filled 2021-08-11: qty 1

## 2021-08-11 MED ORDER — SODIUM CHLORIDE (PF) 0.9 % IJ SOLN
INTRAMUSCULAR | Status: DC | PRN
  Administered 2021-08-11: 30 mL

## 2021-08-11 SURGICAL SUPPLY — 51 items
ATTUNE MED DOME PAT 41 KNEE (Knees) ×1 IMPLANT
ATTUNE PS FEM LT SZ 8 CEM KNEE (Femur) ×1 IMPLANT
BAG COUNTER SPONGE SURGICOUNT (BAG) IMPLANT
BAG ZIPLOCK 12X15 (MISCELLANEOUS) IMPLANT
BASE TIBIAL ROT PLAT SZ 8 KNEE (Knees) IMPLANT
BLADE SAG 18X100X1.27 (BLADE) ×2 IMPLANT
BLADE SAW SGTL 13X75X1.27 (BLADE) ×2 IMPLANT
BNDG ELASTIC 6X10 VLCR STRL LF (GAUZE/BANDAGES/DRESSINGS) ×2 IMPLANT
BNDG GAUZE DERMACEA FLUFF (GAUZE/BANDAGES/DRESSINGS) ×1
BNDG GAUZE DERMACEA FLUFF 4 (GAUZE/BANDAGES/DRESSINGS) ×1 IMPLANT
BOWL SMART MIX CTS (DISPOSABLE) ×2 IMPLANT
CEMENT HV SMART SET (Cement) ×3 IMPLANT
COVER SURGICAL LIGHT HANDLE (MISCELLANEOUS) ×2 IMPLANT
CUFF TOURN SGL QUICK 34 (TOURNIQUET CUFF) ×1
CUFF TRNQT CYL 34X4.125X (TOURNIQUET CUFF) ×1 IMPLANT
DRAPE INCISE IOBAN 66X45 STRL (DRAPES) ×2 IMPLANT
DRAPE SHEET LG 3/4 BI-LAMINATE (DRAPES) ×2 IMPLANT
DRAPE U-SHAPE 47X51 STRL (DRAPES) ×2 IMPLANT
DRSG ADAPTIC 3X8 NADH LF (GAUZE/BANDAGES/DRESSINGS) ×2 IMPLANT
DRSG PAD ABDOMINAL 8X10 ST (GAUZE/BANDAGES/DRESSINGS) ×2 IMPLANT
DURAPREP 26ML APPLICATOR (WOUND CARE) ×2 IMPLANT
ELECT REM PT RETURN 15FT ADLT (MISCELLANEOUS) ×2 IMPLANT
GAUZE SPONGE 4X4 12PLY STRL (GAUZE/BANDAGES/DRESSINGS) ×2 IMPLANT
GLOVE BIOGEL PI IND STRL 7.5 (GLOVE) ×1 IMPLANT
GLOVE BIOGEL PI IND STRL 8.5 (GLOVE) ×1 IMPLANT
GLOVE BIOGEL PI INDICATOR 7.5 (GLOVE) ×1
GLOVE BIOGEL PI INDICATOR 8.5 (GLOVE) ×1
GLOVE ORTHO TXT STRL SZ7.5 (GLOVE) ×2 IMPLANT
GLOVE SURG ORTHO 8.5 STRL (GLOVE) ×4 IMPLANT
GOWN STRL REUS W/ TWL XL LVL3 (GOWN DISPOSABLE) ×2 IMPLANT
GOWN STRL REUS W/TWL XL LVL3 (GOWN DISPOSABLE) ×2
HANDPIECE INTERPULSE COAX TIP (DISPOSABLE) ×1
HOLDER FOLEY CATH W/STRAP (MISCELLANEOUS) IMPLANT
IMMOBILIZER KNEE 20 (SOFTGOODS)
IMMOBILIZER KNEE 20 THIGH 36 (SOFTGOODS) IMPLANT
INSERT TIB ATTUNE RP SZ8X14 (Insert) ×1 IMPLANT
KIT TURNOVER KIT A (KITS) IMPLANT
MANIFOLD NEPTUNE II (INSTRUMENTS) ×2 IMPLANT
NS IRRIG 1000ML POUR BTL (IV SOLUTION) ×2 IMPLANT
PACK TOTAL KNEE CUSTOM (KITS) ×2 IMPLANT
PROTECTOR NERVE ULNAR (MISCELLANEOUS) ×2 IMPLANT
SET HNDPC FAN SPRY TIP SCT (DISPOSABLE) ×1 IMPLANT
STAPLER VISISTAT 35W (STAPLE) IMPLANT
STRIP CLOSURE SKIN 1/2X4 (GAUZE/BANDAGES/DRESSINGS) ×3 IMPLANT
SUT MNCRL AB 3-0 PS2 18 (SUTURE) ×2 IMPLANT
SUT VIC AB 0 CT1 36 (SUTURE) ×2 IMPLANT
SUT VIC AB 1 CT1 36 (SUTURE) ×6 IMPLANT
SUT VIC AB 2-0 CT1 27 (SUTURE) ×1
SUT VIC AB 2-0 CT1 TAPERPNT 27 (SUTURE) ×1 IMPLANT
TIBIAL BASE ROT PLAT SZ 8 KNEE (Knees) ×2 IMPLANT
WATER STERILE IRR 1000ML POUR (IV SOLUTION) ×4 IMPLANT

## 2021-08-11 NOTE — Progress Notes (Signed)
Orthopedic Tech Progress Note Patient Details:  Bradley Novak 03-04-1950 [id number]  Ortho Devices Type of Ortho Device: Bone foam zero knee Ortho Device/Splint Location: Left knee Ortho Device/Splint Interventions: Application   Post Interventions Patient Tolerated: Well  Bradley Novak 08/11/2021, 12:13 PM

## 2021-08-11 NOTE — Evaluation (Signed)
Physical Therapy Evaluation Patient Details Name: Bradley Novak MRN: [id number] DOB: 1950/05/30 Today's Date: 08/11/2021  History of Present Illness  Pt s/p L TKR and with hx of SVT and Hodgkins Lymphoma  Clinical Impression  Pt s/p L TKR and presents with decreased L LE strength/ROM and post op pain limiting functional mobility.  Pt should progress well to dc home with family assist and reports first OP PT scheduled for Monday 08/14/21.     Recommendations for follow up therapy are one component of a multi-disciplinary discharge planning process, led by the attending physician.  Recommendations may be updated based on patient status, additional functional criteria and insurance authorization.  Follow Up Recommendations Follow physician's recommendations for discharge plan and follow up therapies      Assistance Recommended at Discharge Intermittent Supervision/Assistance  Patient can return home with the following  A little help with walking and/or transfers;A little help with bathing/dressing/bathroom;Assist for transportation;Help with stairs or ramp for entrance;Assistance with cooking/housework    Equipment Recommendations None recommended by PT  Recommendations for Other Services       Functional Status Assessment Patient has had a recent decline in their functional status and demonstrates the ability to make significant improvements in function in a reasonable and predictable amount of time.     Precautions / Restrictions Precautions Precautions: Knee;Fall Required Braces or Orthoses: Knee Immobilizer - Left Knee Immobilizer - Left: Discontinue once straight leg raise with < 10 degree lag Restrictions Weight Bearing Restrictions: No Other Position/Activity Restrictions: WBAT      Mobility  Bed Mobility Overal bed mobility: Needs Assistance Bed Mobility: Supine to Sit, Sit to Supine     Supine to sit: Min assist Sit to supine: Min assist   General bed mobility  comments: cues for sequence and use of R LE to self assist    Transfers Overall transfer level: Needs assistance Equipment used: Rolling walker (2 wheels) Transfers: Sit to/from Stand Sit to Stand: Min assist           General transfer comment: cues for LE management and use of UEs to self assist    Ambulation/Gait Ambulation/Gait assistance: Min assist Gait Distance (Feet): 31 Feet Assistive device: Rolling walker (2 wheels) Gait Pattern/deviations: Step-to pattern, Decreased step length - right, Decreased step length - left, Shuffle, Antalgic, Trunk flexed Gait velocity: decr     General Gait Details: cues for sequence, posture and position from [employer]            Wheelchair Mobility    Modified Rankin (Stroke Patients Only)       Balance Overall balance assessment: Needs assistance Sitting-balance support: No upper extremity supported, Feet supported Sitting balance-Leahy Scale: Good     Standing balance support: Single extremity supported Standing balance-Leahy Scale: Poor                               Pertinent Vitals/Pain Pain Assessment Pain Assessment: 0-10 Pain Score: 6  Pain Location: L knee with WB Pain Descriptors / Indicators: Aching, Grimacing, Sharp, Sore Pain Intervention(s): Limited activity within patient's tolerance, Premedicated before session, Monitored during session, Ice applied, Patient requesting pain meds-RN notified    Home Living Family/patient expects to be discharged to:: Private residence Living Arrangements: Spouse/significant other Available Help at Discharge: Family Type of Home: House Home Access: Stairs to enter   Technical brewer of Steps: 1+1   Home Layout: One level Home Equipment:  Rolling Walker (2 wheels);Cane - single point      Prior Function Prior Level of Function : Independent/Modified Independent                     Hand Dominance        Extremity/Trunk  Assessment   Upper Extremity Assessment Upper Extremity Assessment: Overall WFL for tasks assessed    Lower Extremity Assessment Lower Extremity Assessment: LLE deficits/detail    Cervical / Trunk Assessment Cervical / Trunk Assessment: Normal  Communication   Communication: No difficulties  Cognition Arousal/Alertness: Awake/alert Behavior During Therapy: WFL for tasks assessed/performed Overall Cognitive Status: Within Functional Limits for tasks assessed                                          General Comments      Exercises Total Joint Exercises Ankle Circles/Pumps: AROM, Both, 15 reps, Supine   Assessment/Plan    PT Assessment Patient needs continued PT services  PT Problem List Decreased strength;Decreased range of motion;Decreased activity tolerance;Decreased balance;Decreased mobility;Decreased knowledge of use of DME;Obesity;Pain       PT Treatment Interventions DME instruction;Gait training;Stair training;Functional mobility training;Therapeutic activities;Therapeutic exercise;Patient/family education    PT Goals (Current goals can be found in the Care Plan section)  Acute Rehab PT Goals Patient Stated Goal: Regain IND PT Goal Formulation: With patient Time For Goal Achievement: 08/24/21 Potential to Achieve Goals: Good    Frequency 7X/week     Co-evaluation               AM-PAC PT "6 Clicks" Mobility  Outcome Measure Help needed turning from your back to your side while in a flat bed without using bedrails?: A Little Help needed moving from lying on your back to sitting on the side of a flat bed without using bedrails?: A Little Help needed moving to and from a bed to a chair (including a wheelchair)?: A Little Help needed standing up from a chair using your arms (e.g., wheelchair or bedside chair)?: A Little Help needed to walk in hospital room?: A Little Help needed climbing 3-5 steps with a railing? : A Lot 6 Click Score:  17    End of Session Equipment Utilized During Treatment: Gait belt;Left knee immobilizer Activity Tolerance: Patient tolerated treatment well Patient left: in bed;with call bell/phone within reach;with bed alarm set;with nursing/sitter in room Nurse Communication: Mobility status PT Visit Diagnosis: Unsteadiness on feet (R26.81);Difficulty in walking, not elsewhere classified (R26.2)    Time: 1610-9604 PT Time Calculation (min) (ACUTE ONLY): 28 min   Charges:   PT Evaluation $PT Eval Low Complexity: 1 Low PT Treatments $Gait Training: 8-22 mins        Debe Coder PT Acute Rehabilitation Services Pager [phone] Office [phone]   Hallie Ertl 08/11/2021, 4:30 PM

## 2021-08-11 NOTE — Transfer of Care (Signed)
Immediate Anesthesia Transfer of Care Note  Patient: Bradley Novak  Procedure(s) Performed: TOTAL KNEE ARTHROPLASTY (Left: Knee)  Patient Location: PACU  Anesthesia Type:Spinal and MAC combined with regional for post-op pain  Level of Consciousness: awake, alert , oriented and patient cooperative  Airway & Oxygen Therapy: Patient Spontanous Breathing and Patient connected to nasal cannula oxygen  Post-op Assessment: Report given to RN and Post -op Vital signs reviewed and stable  Post vital signs: Reviewed and stable  Last Vitals:  Vitals Value Taken Time  BP 108/60 08/11/21 0921  Temp    Pulse 58 08/11/21 0924  Resp 15 08/11/21 0924  SpO2 98 % 08/11/21 0924  Vitals shown include unvalidated device data.  Last Pain:  Vitals:   08/11/21 0554  TempSrc:   PainSc: 0-No pain         Complications: No notable events documented.

## 2021-08-11 NOTE — TOC Transition Note (Signed)
Transition of Care [facility]) - CM/SW Discharge Note  Patient Details  Name: Bradley Novak MRN: [id number] Date of Birth: [birth year]  Transition of Care [facility]) CM/SW Contact:  Sherie Don, LCSW Phone Number: 08/11/2021, 3:40 PM  Clinical Narrative: Patient is expected to discharge home after working with PT. CSW spoke with patient to confirm discharge plan. Patient will go home with OPPT at [facility] with the first appointment scheduled on 08/14/21. Patient confirmed he has a rolling walker at home, so there are no DME needs at this time. TOC signing off.  Final next level of care: OP Rehab Barriers to Discharge: No Barriers Identified  Patient Goals and CMS Choice Patient states their goals for this hospitalization and ongoing recovery are:: Discharge home with OPPT at [facility] Choice offered to / list presented to : NA  Discharge Plan and Services         DME Arranged: N/A DME Agency: NA  Readmission Risk Interventions     No data to display

## 2021-08-11 NOTE — Anesthesia Procedure Notes (Signed)
Anesthesia Regional Block: Adductor canal block   Pre-Anesthetic Checklist: , timeout performed,  Correct Patient, Correct Site, Correct Laterality,  Correct Procedure, Correct Position, site marked,  Risks and benefits discussed,  Surgical consent,  Pre-op evaluation,  At surgeon's request and post-op pain management  Laterality: Left  Prep: chloraprep       Needles:  Injection technique: Single-shot  Needle Type: Stimiplex     Needle Length: 9cm  Needle Gauge: 21     Additional Needles:   Procedures:,,,, ultrasound used (permanent image in chart),,    Narrative:  Start time: 08/11/2021 6:55 AM End time: 08/11/2021 7:00 AM Injection made incrementally with aspirations every 5 mL.  Performed by: Personally  Anesthesiologist: Lynda Rainwater, MD

## 2021-08-11 NOTE — Care Plan (Signed)
Ortho Bundle Case Management Note  Patient Details  Name: Bradley Novak MRN: [id number] Date of Birth: 08-Jul-1950                  L TKA on 08/11/21. DCP: Home with wife [name]. DME: No needs. Has RW. PT: EO 7/31   DME Arranged:  N/A DME Agency:     HH Arranged:    HH Agency:     Additional Comments: Please contact me with any questions of if this plan should need to change.  Marianne Sofia, RN,CCM [facility]  [phone] 08/11/2021, 10:01 AM

## 2021-08-11 NOTE — Anesthesia Procedure Notes (Signed)
Procedure Name: MAC Date/Time: 08/11/2021 7:18 AM  Performed by: West Pugh, CRNAPre-anesthesia Checklist: Patient identified, Emergency Drugs available, Suction available and Patient being monitored Patient Re-evaluated:Patient Re-evaluated prior to induction Oxygen Delivery Method: Simple face mask Preoxygenation: Pre-oxygenation with 100% oxygen Induction Type: IV induction Placement Confirmation: positive ETCO2 Dental Injury: Teeth and Oropharynx as per pre-operative assessment

## 2021-08-11 NOTE — Progress Notes (Signed)
PHARMACIST - PHYSICIAN ORDER COMMUNICATION  CONCERNING: P&T Medication Policy on Herbal Medications  DESCRIPTION:  This patient's order for:  cranberry  has been noted.  This product(s) is classified as an "herbal" or natural product. Due to a lack of definitive safety studies or FDA approval, nonstandard manufacturing practices, plus the potential risk of unknown drug-drug interactions while on inpatient medications, the Pharmacy and Therapeutics Committee does not permit the use of "herbal" or natural products of this type within [place].   ACTION TAKEN: The pharmacy department is unable to verify this order at this time and your patient has been informed of this safety policy. Please reevaluate patient's clinical condition at discharge and address if the herbal or natural product(s) should be resumed at that time.   

## 2021-08-11 NOTE — Interval H&P Note (Signed)
History and Physical Interval Note:  08/11/2021 7:11 AM  Bradley Novak  has presented today for surgery, with the diagnosis of Arthritis of left knee.  The various methods of treatment have been discussed with the patient and family. After consideration of risks, benefits and other options for treatment, the patient has consented to  Procedure(s): TOTAL KNEE ARTHROPLASTY (Left) as a surgical intervention.  The patient's history has been reviewed, patient examined, no change in status, stable for surgery.  I have reviewed the patient's chart and labs.  Questions were answered to the patient's satisfaction.     Augustin Schooling

## 2021-08-11 NOTE — Brief Op Note (Signed)
08/11/2021  9:07 AM  PATIENT:  Bradley Novak  71 y.o. male  PRE-OPERATIVE DIAGNOSIS:  Arthritis of left knee, end stage OA  POST-OPERATIVE DIAGNOSIS:  Arthritis of left knee, end stage OA  PROCEDURE:  Procedure(s): TOTAL KNEE ARTHROPLASTY (Left) DePuy Attune  SURGEON:  Surgeon(s) and Role:    Netta Cedars, MD - Primary  PHYSICIAN ASSISTANT:   ASSISTANTS: Ventura Bruns, PA-C   ANESTHESIA:   regional and spinal  EBL:  50 mL   BLOOD ADMINISTERED:none  DRAINS: none   LOCAL MEDICATIONS USED:  MARCAINE     SPECIMEN:  No Specimen  DISPOSITION OF SPECIMEN:  N/A  COUNTS:  YES  TOURNIQUET:  * Missing tourniquet times found for documented tourniquets in log: [id number] *  DICTATION: .Other Dictation: Dictation Number [id number]  PLAN OF CARE: Admit for overnight observation  PATIENT DISPOSITION:  PACU - hemodynamically stable.   Delay start of Pharmacological VTE agent (>24hrs) due to surgical blood loss or risk of bleeding: no

## 2021-08-11 NOTE — Anesthesia Procedure Notes (Signed)
Spinal  Patient location during procedure: OR Start time: 08/11/2021 7:21 AM End time: 08/11/2021 7:26 AM Reason for block: surgical anesthesia Staffing Performed: anesthesiologist  Anesthesiologist: Lynda Rainwater, MD Performed by: Lynda Rainwater, MD Authorized by: Lynda Rainwater, MD   Preanesthetic Checklist Completed: patient identified, IV checked, site marked, risks and benefits discussed, surgical consent, monitors and equipment checked, pre-op evaluation and timeout performed Spinal Block Patient position: sitting Prep: DuraPrep Patient monitoring: heart rate, cardiac monitor, continuous pulse ox and blood pressure Approach: midline Location: L3-4 Injection technique: single-shot Needle Needle type: Sprotte  Needle gauge: 24 G Needle length: 9 cm Assessment Sensory level: T4 Events: CSF return and second provider Additional Notes First attempt by CRNA.  2nd attempt successful by anesthesiologist

## 2021-08-11 NOTE — Anesthesia Postprocedure Evaluation (Signed)
Anesthesia Post Note  Patient: Bradley Novak  Procedure(s) Performed: TOTAL KNEE ARTHROPLASTY (Left: Knee)     Patient location during evaluation: PACU Anesthesia Type: Regional and Spinal Level of consciousness: awake and alert Pain management: pain level controlled Vital Signs Assessment: post-procedure vital signs reviewed and stable Respiratory status: spontaneous breathing, nonlabored ventilation and respiratory function stable Cardiovascular status: blood pressure returned to baseline and stable Postop Assessment: no apparent nausea or vomiting Anesthetic complications: no   No notable events documented.  Last Vitals:  Vitals:   08/11/21 1015 08/11/21 1030  BP: 132/72 133/85  Pulse: (!) 54 (!) 53  Resp: 12 15  Temp:    SpO2: 98% 96%    Last Pain:  Vitals:   08/11/21 1030  TempSrc:   PainSc: 0-No pain                 Lynda Rainwater

## 2021-08-11 NOTE — Op Note (Signed)
NAME: Bradley Novak, Bradley Novak MEDICAL RECORD NO: [id number] ACCOUNT NO: [id number] DATE OF BIRTH: 01/19/1950 FACILITY: [facility] LOCATION: [facility] PHYSICIAN: Doran Heater. Veverly Fells, MD  Operative Report   DATE OF PROCEDURE: 08/11/2021   PREOPERATIVE DIAGNOSIS:  Left knee end-stage osteoarthritis.  POSTOPERATIVE DIAGNOSIS:  Left knee end-stage osteoarthritis.  PROCEDURE PERFORMED:  Left total knee arthroplasty using DePuy Attune prosthesis.  ATTENDING SURGEON:  Doran Heater. Veverly Fells, MD  ASSISTANT:  Darol Destine, MD, PA-C, who was scrubbed during the entire procedure, and necessary for satisfactory completion of surgery.  ANESTHESIA:  Spinal anesthesia plus adductor canal block was used.  ESTIMATED BLOOD LOSS:  Minimal.  FLUID REPLACEMENT:  1500 mL crystalloid.  INSTRUMENT COUNTS:  Correct.  COMPLICATIONS:  No complications.  ANTIBIOTICS:  Perioperative antibiotics were given.  INDICATIONS:  The patient is a 71 year old male who presents with a history of worsening left knee pain secondary to bone-on-bone arthritis.  The patient has failed conservative management, desires operative treatment to relieve pain and restore  function.  Informed consent obtained.  DESCRIPTION OF PROCEDURE:  After an adequate level of spinal anesthesia was achieved and the adductor canal block was placed, the patient was positioned supine on the operating table.  Left leg correctly identified.  A nonsterile tourniquet placed on the  proximal thigh.  Left leg sterilely prepped and draped in the usual manner.  Timeout called, verifying correct patient, correct site. We elevated the leg and exsanguinated with an Esmarch bandage inflating the tourniquet to 325 mmHg, placed the knee in  flexion and performed a longitudinal midline incision with a 10 blade scalpel.  Dissection down through subcutaneous tissues.  We used a fresh 10 blade scalpel to perform the parapatellar arthrotomy on the medial side.  We then everted the  patella,  dividing lateral patellofemoral ligaments.  We then entered the distal femur, which was devoid of cartilage with a step cut drill.  We placed our intramedullary guide and resected 9 mm off the distal femur set on 5 degrees of valgus.  Next, we sized our  femur to a size 8 anterior down performing anterior, posterior and chamfer cuts with the 4-in-1 block.  Next, we removed ACL and PCL meniscal tissue subluxing the tibia anteriorly and performing our tibial cut 90 degrees perpendicular to the long axis of  the tibia, resecting 2 mm off the medial side.  We used a minimal posterior slope for this posterior cruciate substituting prosthesis.  Once we had our tibial cut done, we used a lamina spreader to remove excess posterior femoral condyle osteophytes.  A  large fabella was present.  It seemed to be engaging a little bit against the popliteus and I went ahead and removed that using the Bovie and a pituitary rongeur.  We had nice symmetric flexion and extension gaps.  We also injected the posterior capsule  with a combination of Marcaine, Exparel, saline.  We then went ahead and completed our tibial preparation with modular drill and keel punch for the 8 tibia.  Next, we did our box cut for the femur for the 8 left femur and then trialled initially with an  8 mm spacer and then eventually with a 10.  We placed the knee in extension and then resurfaced our patella going from a 27 mm thickness down to a 17 mm thickness.  We drilled the lug holes for the 41 patellar button and then trialed the patella ranging  the knee with excellent tracking.  We did a small lateral release  as there was a tiny bit of tilt, which resolved with the release.  We then removed all trial components, pulse irrigated and dried the bone well.  We vacuum mixed high viscosity cement on  the back table cementing everything in one step tibia, femur and we placed the 10 mm spacer and held the knee in extension while the cement  hardened.  We used a patellar clamp for the patella.  Once all the cement was hard, we removed excess cement with  quarter-inch curved osteotome, did a final inspection of the knee joint.  We trialled with the 12 and felt like we could get the 14 and just to make sure we were extra tight in flexion and then went ahead and selected the real size 14 poly placed that on  the tibia and then reduced the knee.  We had nice little pop as that medial condyle reduced, we were able to have full flexion and extension with great stability and normal patellar tracking.  At this point, we irrigated thoroughly.  We injected the  anterior capsule and then closed the parapatellar arthrotomy with #1 Vicryl suture, followed by 0 and 2-0 Vicryl subcutaneous closure and 4-0 running Monocryl for skin.  Steri-Strips were applied followed by sterile dressing.  The patient tolerated  surgery well.     [email or web address].Darby D: 08/11/2021 9:12:36 am T: 08/11/2021 9:32:00 am  JOB: [id number]/ [id number]

## 2021-08-11 NOTE — Progress Notes (Signed)
HR spiked upto 150s. Dr Veverly Fells notified. 2 ltr oxygen started via nasal cannula. HR down to 70s. Pt has history of SVT. Requested for telemonitoring.

## 2021-08-11 NOTE — Discharge Instructions (Signed)
Ice to the knee constantly.  Keep the incision covered and clean and dry for one week, then ok to get it wet in the shower.  Do exercise as instructed every hour, please to prevent stiffness.    DO NOT prop anything under the knee, it will make your knee stiff.  Prop under the ankle to encourage your knee to go straight.   Use the walker while you are up and around for balance.  Wear your support stockings 24/7 to prevent blood clots and take baby aspirin twice daily for 30 days also to prevent blood clots  Follow up with Dr Levada Bowersox in two weeks in the office, call [phone] for appt   INSTRUCTIONS AFTER JOINT REPLACEMENT   Remove items at home which could result in a fall. This includes throw rugs or furniture in walking pathways ICE to the affected joint every three hours while awake for 30 minutes at a time, for at least the first 3-5 days, and then as needed for pain and swelling.  Continue to use ice for pain and swelling. You may notice swelling that will progress down to the foot and ankle.  This is normal after surgery.  Elevate your leg when you are not up walking on it.   Continue to use the breathing machine you got in the hospital (incentive spirometer) which will help keep your temperature down.  It is common for your temperature to cycle up and down following surgery, especially at night when you are not up moving around and exerting yourself.  The breathing machine keeps your lungs expanded and your temperature down.   DIET:  As you were doing prior to hospitalization, we recommend a well-balanced diet.  DRESSING / WOUND CARE / SHOWERING  You may change your dressing 3-5 days after surgery.  Then change the dressing every day with sterile gauze.  Please use good hand washing techniques before changing the dressing.  Do not use any lotions or creams on the incision until instructed by your surgeon.  ACTIVITY  Increase activity slowly as tolerated, but follow the weight  bearing instructions below.   No driving for 6 weeks or until further direction given by your physician.  You cannot drive while taking narcotics.  No lifting or carrying greater than 10 lbs. until further directed by your surgeon. Avoid periods of inactivity such as sitting longer than an hour when not asleep. This helps prevent blood clots.  You may return to work once you are authorized by your doctor.     WEIGHT BEARING   Weight bearing as tolerated with assist device (walker, cane, etc) as directed, use it as long as suggested by your surgeon or therapist, typically at least 4-6 weeks.   EXERCISES  Results after joint replacement surgery are often greatly improved when you follow the exercise, range of motion and muscle strengthening exercises prescribed by your doctor. Safety measures are also important to protect the joint from further injury. Any time any of these exercises cause you to have increased pain or swelling, decrease what you are doing until you are comfortable again and then slowly increase them. If you have problems or questions, call your caregiver or physical therapist for advice.   Rehabilitation is important following a joint replacement. After just a few days of immobilization, the muscles of the leg can become weakened and shrink (atrophy).  These exercises are designed to build up the tone and strength of the thigh and leg muscles and to   improve motion. Often times heat used for twenty to thirty minutes before working out will loosen up your tissues and help with improving the range of motion but do not use heat for the first two weeks following surgery (sometimes heat can increase post-operative swelling).   These exercises can be done on a training (exercise) mat, on the floor, on a table or on a bed. Use whatever works the best and is most comfortable for you.    Use music or television while you are exercising so that the exercises are a pleasant break in your day.  This will make your life better with the exercises acting as a break in your routine that you can look forward to.   Perform all exercises about fifteen times, three times per day or as directed.  You should exercise both the operative leg and the other leg as well.  Exercises include:   Quad Sets - Tighten up the muscle on the front of the thigh (Quad) and hold for 5-10 seconds.   Straight Leg Raises - With your knee straight (if you were given a brace, keep it on), lift the leg to 60 degrees, hold for 3 seconds, and slowly lower the leg.  Perform this exercise against resistance later as your leg gets stronger.  Leg Slides: Lying on your back, slowly slide your foot toward your buttocks, bending your knee up off the floor (only go as far as is comfortable). Then slowly slide your foot back down until your leg is flat on the floor again.  Angel Wings: Lying on your back spread your legs to the side as far apart as you can without causing discomfort.  Hamstring Strength:  Lying on your back, push your heel against the floor with your leg straight by tightening up the muscles of your buttocks.  Repeat, but this time bend your knee to a comfortable angle, and push your heel against the floor.  You may put a pillow under the heel to make it more comfortable if necessary.   A rehabilitation program following joint replacement surgery can speed recovery and prevent re-injury in the future due to weakened muscles. Contact your doctor or a physical therapist for more information on knee rehabilitation.    CONSTIPATION  Constipation is defined medically as fewer than three stools per week and severe constipation as less than one stool per week.  Even if you have a regular bowel pattern at home, your normal regimen is likely to be disrupted due to multiple reasons following surgery.  Combination of anesthesia, postoperative narcotics, change in appetite and fluid intake all can affect your bowels.   YOU MUST  use at least one of the following options; they are listed in order of increasing strength to get the job done.  They are all available over the counter, and you may need to use some, POSSIBLY even all of these options:    Drink plenty of fluids (prune juice may be helpful) and high fiber foods Colace 100 mg by mouth twice a day  Senokot for constipation as directed and as needed Dulcolax (bisacodyl), take with full glass of water  Miralax (polyethylene glycol) once or twice a day as needed.  If you have tried all these things and are unable to have a bowel movement in the first 3-4 days after surgery call either your surgeon or your primary doctor.    If you experience loose stools or diarrhea, hold the medications until you stool forms back   up.  If your symptoms do not get better within 1 week or if they get worse, check with your doctor.  If you experience "the worst abdominal pain ever" or develop nausea or vomiting, please contact the office immediately for further recommendations for treatment.   ITCHING:  If you experience itching with your medications, try taking only a single pain pill, or even half a pain pill at a time.  You can also use Benadryl over the counter for itching or also to help with sleep.   TED HOSE STOCKINGS:  Use stockings on both legs until for at least 2 weeks or as directed by physician office. They may be removed at night for sleeping.  MEDICATIONS:  See your medication summary on the "After Visit Summary" that nursing will review with you.  You may have some home medications which will be placed on hold until you complete the course of blood thinner medication.  It is important for you to complete the blood thinner medication as prescribed.  PRECAUTIONS:  If you experience chest pain or shortness of breath - call 911 immediately for transfer to the hospital emergency department.   If you develop a fever greater that 101 F, purulent drainage from wound, increased  redness or drainage from wound, foul odor from the wound/dressing, or calf pain - CONTACT YOUR SURGEON.                                                   FOLLOW-UP APPOINTMENTS:  If you do not already have a post-op appointment, please call the office for an appointment to be seen by your surgeon.  Guidelines for how soon to be seen are listed in your "After Visit Summary", but are typically between 1-4 weeks after surgery.  OTHER INSTRUCTIONS:   Knee Replacement:  Do not place pillow under knee, focus on keeping the knee straight while resting. CPM instructions: 0-90 degrees, 2 hours in the morning, 2 hours in the afternoon, and 2 hours in the evening. Place foam block, curve side up under heel at all times except when in CPM or when walking.  DO NOT modify, tear, cut, or change the foam block in any way.  POST-OPERATIVE OPIOID TAPER INSTRUCTIONS: It is important to wean off of your opioid medication as soon as possible. If you do not need pain medication after your surgery it is ok to stop day one. Opioids include: Codeine, Hydrocodone(Norco, Vicodin), Oxycodone(Percocet, oxycontin) and hydromorphone amongst others.  Long term and even short term use of opiods can cause: Increased pain response Dependence Constipation Depression Respiratory depression And more.  Withdrawal symptoms can include Flu like symptoms Nausea, vomiting And more Techniques to manage these symptoms Hydrate well Eat regular healthy meals Stay active Use relaxation techniques(deep breathing, meditating, yoga) Do Not substitute Alcohol to help with tapering If you have been on opioids for less than two weeks and do not have pain than it is ok to stop all together.  Plan to wean off of opioids This plan should start within one week post op of your joint replacement. Maintain the same interval or time between taking each dose and first decrease the dose.  Cut the total daily intake of opioids by one tablet each  day Next start to increase the time between doses. The last dose that should be eliminated is   the evening dose.   MAKE SURE YOU:  Understand these instructions.  Get help right away if you are not doing well or get worse.    Thank you for letting us be a part of your medical care team.  It is a privilege we respect greatly.  We hope these instructions will help you stay on track for a fast and full recovery!      

## 2021-08-12 DIAGNOSIS — I10 Essential (primary) hypertension: Secondary | ICD-10-CM | POA: Diagnosis not present

## 2021-08-12 DIAGNOSIS — Z8571 Personal history of Hodgkin lymphoma: Secondary | ICD-10-CM | POA: Diagnosis not present

## 2021-08-12 DIAGNOSIS — Z87891 Personal history of nicotine dependence: Secondary | ICD-10-CM | POA: Diagnosis not present

## 2021-08-12 DIAGNOSIS — Z79899 Other long term (current) drug therapy: Secondary | ICD-10-CM | POA: Diagnosis not present

## 2021-08-12 DIAGNOSIS — M1712 Unilateral primary osteoarthritis, left knee: Secondary | ICD-10-CM | POA: Diagnosis not present

## 2021-08-12 NOTE — Progress Notes (Signed)
Physical Therapy Treatment Patient Details Name: Bradley Novak MRN: [id number] DOB: 01/17/1950 Today's Date: 08/12/2021   History of Present Illness Pt s/p L TKR and with hx of SVT and Hodgkins Lymphoma    PT Comments    Pt continues very motivated and up to ambulate in hall, negotiated step and reviewed written HEP.  Spouse present for session.  Pt eager for dc home this date.   Recommendations for follow up therapy are one component of a multi-disciplinary discharge planning process, led by the attending physician.  Recommendations may be updated based on patient status, additional functional criteria and insurance authorization.  Follow Up Recommendations  Follow physician's recommendations for discharge plan and follow up therapies     Assistance Recommended at Discharge Intermittent Supervision/Assistance  Patient can return home with the following A little help with walking and/or transfers;A little help with bathing/dressing/bathroom;Assist for transportation;Help with stairs or ramp for entrance;Assistance with cooking/housework   Equipment Recommendations  None recommended by PT    Recommendations for Other Services       Precautions / Restrictions Precautions Precautions: Knee;Fall Required Braces or Orthoses: Knee Immobilizer - Left Knee Immobilizer - Left: Discontinue once straight leg raise with < 10 degree lag Restrictions Weight Bearing Restrictions: No Other Position/Activity Restrictions: WBAT     Mobility  Bed Mobility Overal bed mobility: Needs Assistance Bed Mobility: Supine to Sit, Sit to Supine     Supine to sit: Min guard, Supervision Sit to supine: Min guard, Supervision   General bed mobility comments: cues for sequence and use of R LE to self assist    Transfers Overall transfer level: Needs assistance Equipment used: Rolling walker (2 wheels) Transfers: Sit to/from Stand Sit to Stand: Min guard, Supervision           General  transfer comment: cues for LE management and use of UEs to self assist    Ambulation/Gait Ambulation/Gait assistance: Min guard, Supervision Gait Distance (Feet): 75 Feet Assistive device: Rolling walker (2 wheels) Gait Pattern/deviations: Step-to pattern, Decreased step length - right, Decreased step length - left, Shuffle, Antalgic, Trunk flexed Gait velocity: decr     General Gait Details: cues for sequence, posture and position from RW   Stairs Stairs: Yes Stairs assistance: Min assist Stair Management: No rails, Step to pattern, Backwards, Forwards, With walker Number of Stairs: 2 General stair comments: single step twice - once fwd and once bkwd; cues for sequence; spouse present   Wheelchair Mobility    Modified Rankin (Stroke Patients Only)       Balance Overall balance assessment: Needs assistance Sitting-balance support: No upper extremity supported, Feet supported Sitting balance-Leahy Scale: Good     Standing balance support: No upper extremity supported Standing balance-Leahy Scale: Fair                              Cognition Arousal/Alertness: Awake/alert Behavior During Therapy: WFL for tasks assessed/performed Overall Cognitive Status: Within Functional Limits for tasks assessed                                          Exercises Total Joint Exercises Ankle Circles/Pumps: AROM, Both, 15 reps, Supine Quad Sets: AROM, Both, 10 reps, Supine Heel Slides: AAROM, Left, 15 reps, Supine Straight Leg Raises: AAROM, AROM, Left, 10 reps, Supine Long Arc Quad: AAROM, AROM,  Left, 10 reps, Seated Goniometric ROM: -5 - 45 pain limited    General Comments        Pertinent Vitals/Pain Pain Assessment Pain Assessment: 0-10 Pain Score: 5  Pain Location: L knee with WB Pain Descriptors / Indicators: Aching, Grimacing, Sharp, Sore Pain Intervention(s): Limited activity within patient's tolerance, Monitored during session,  Premedicated before session, Ice applied    Home Living                          Prior Function            PT Goals (current goals can now be found in the care plan section) Acute Rehab PT Goals Patient Stated Goal: Regain IND PT Goal Formulation: With patient Time For Goal Achievement: 08/24/21 Potential to Achieve Goals: Good Progress towards PT goals: Progressing toward goals    Frequency    7X/week      PT Plan Current plan remains appropriate    Co-evaluation              AM-PAC PT "6 Clicks" Mobility   Outcome Measure  Help needed turning from your back to your side while in a flat bed without using bedrails?: A Little Help needed moving from lying on your back to sitting on the side of a flat bed without using bedrails?: A Little Help needed moving to and from a bed to a chair (including a wheelchair)?: A Little Help needed standing up from a chair using your arms (e.g., wheelchair or bedside chair)?: A Little Help needed to walk in hospital room?: A Little Help needed climbing 3-5 steps with a railing? : A Little 6 Click Score: 18    End of Session Equipment Utilized During Treatment: Gait belt Activity Tolerance: Patient tolerated treatment well Patient left: in bed;with call bell/phone within reach;with family/visitor present Nurse Communication: Mobility status PT Visit Diagnosis: Unsteadiness on feet (R26.81);Difficulty in walking, not elsewhere classified (R26.2)     Time: 8592-9244 PT Time Calculation (min) (ACUTE ONLY): 19 min  Charges:  $Gait Training: 8-22 mins $Therapeutic Exercise: 8-22 mins                     Debe Coder PT Acute Rehabilitation Services Pager [phone] Office [phone]    Zabdi Mis 08/12/2021, 12:21 PM

## 2021-08-12 NOTE — Progress Notes (Signed)
   Subjective: 1 Day Post-Op Procedure(s) (LRB): TOTAL KNEE ARTHROPLASTY (Left)  Overall doing well Mild to moderate pain/soreness in the knee currently Denies any new symptoms overnight Patient reports pain as moderate.  Objective:   VITALS:   Vitals:   08/11/21 2123 08/12/21 0510  BP: 136/83 124/66  Pulse: 70 63  Resp: 18 17  Temp: 98 F (36.7 C) 97.6 F (36.4 C)  SpO2: 100% 99%    Left knee dressing intact Nv intact distally Good early rom and ambulation with walker No edema distally  LABS No results for input(s): "HGB", "HCT", "WBC", "PLT" in the last 72 hours.  No results for input(s): "NA", "K", "BUN", "CREATININE", "GLUCOSE" in the last 72 hours.   Assessment/Plan: 1 Day Post-Op Procedure(s) (LRB): TOTAL KNEE ARTHROPLASTY (Left) Plan for d/c today after therapy F/u in 2 weeks in the office Continue PT/OT Pulmonary toilet     Brad Luna [place] [employer] [facility] [address], [place], [place], [place] [address] Phone: [phone] [email or web address] Facebook  [employer]

## 2021-08-12 NOTE — Progress Notes (Signed)
Pt alert and oriented. Surgical dressing clean, dry, intact. No questions or queries regarding discharge instructions. Delongings sent home with pt.

## 2021-08-12 NOTE — Plan of Care (Signed)
  Problem: Education: Goal: Knowledge of General Education information will improve Description: Including pain rating scale, medication(s)/side effects and non-pharmacologic comfort measures Outcome: Progressing   Problem: Pain Managment: Goal: General experience of comfort will improve Outcome: Progressing   Problem: Safety: Goal: Ability to remain free from injury will improve Outcome: Progressing   

## 2021-08-12 NOTE — Progress Notes (Signed)
Physical Therapy Treatment Patient Details Name: Bradley Novak MRN: [id number] DOB: January 26, 1950 Today's Date: 08/12/2021   History of Present Illness Pt s/p L TKR and with hx of SVT and Hodgkins Lymphoma    PT Comments    Pt very motivated and progressing well with mobility.  Pt up to ambulate increased distance in hall and HEP initiated.  Pt hopeful for dc home later today.   Recommendations for follow up therapy are one component of a multi-disciplinary discharge planning process, led by the attending physician.  Recommendations may be updated based on patient status, additional functional criteria and insurance authorization.  Follow Up Recommendations  Follow physician's recommendations for discharge plan and follow up therapies     Assistance Recommended at Discharge Intermittent Supervision/Assistance  Patient can return home with the following A little help with walking and/or transfers;A little help with bathing/dressing/bathroom;Assist for transportation;Help with stairs or ramp for entrance;Assistance with cooking/housework   Equipment Recommendations  None recommended by PT    Recommendations for Other Services       Precautions / Restrictions Precautions Precautions: Knee;Fall Required Braces or Orthoses: Knee Immobilizer - Left Knee Immobilizer - Left: Discontinue once straight leg raise with < 10 degree lag Restrictions Weight Bearing Restrictions: No Other Position/Activity Restrictions: WBAT     Mobility  Bed Mobility Overal bed mobility: Needs Assistance Bed Mobility: Supine to Sit     Supine to sit: Min guard     General bed mobility comments: cues for sequence and use of R LE to self assist    Transfers Overall transfer level: Needs assistance Equipment used: Rolling walker (2 wheels) Transfers: Sit to/from Stand Sit to Stand: Min guard           General transfer comment: cues for LE management and use of UEs to self assist     Ambulation/Gait Ambulation/Gait assistance: Min guard Gait Distance (Feet): 85 Feet Assistive device: Rolling walker (2 wheels) Gait Pattern/deviations: Step-to pattern, Decreased step length - right, Decreased step length - left, Shuffle, Antalgic, Trunk flexed Gait velocity: decr     General Gait Details: cues for sequence, posture and position from [employer]             Wheelchair Mobility    Modified Rankin (Stroke Patients Only)       Balance Overall balance assessment: Needs assistance Sitting-balance support: No upper extremity supported, Feet supported Sitting balance-Leahy Scale: Good     Standing balance support: No upper extremity supported Standing balance-Leahy Scale: Fair                              Cognition Arousal/Alertness: Awake/alert Behavior During Therapy: WFL for tasks assessed/performed Overall Cognitive Status: Within Functional Limits for tasks assessed                                          Exercises Total Joint Exercises Ankle Circles/Pumps: AROM, Both, 15 reps, Supine Quad Sets: AROM, Both, 10 reps, Supine Heel Slides: AAROM, Left, 15 reps, Supine Straight Leg Raises: AAROM, AROM, Left, 10 reps, Supine Long Arc Quad: AAROM, AROM, Left, 10 reps, Seated Goniometric ROM: -5 - 45 pain limited    General Comments        Pertinent Vitals/Pain Pain Assessment Pain Assessment: 0-10 Pain Score: 6  Pain Location: L knee with  WB Pain Descriptors / Indicators: Aching, Grimacing, Sharp, Sore Pain Intervention(s): Limited activity within patient's tolerance, Monitored during session, Premedicated before session, Ice applied    Home Living                          Prior Function            PT Goals (current goals can now be found in the care plan section) Acute Rehab PT Goals Patient Stated Goal: Regain IND PT Goal Formulation: With patient Time For Goal Achievement:  08/24/21 Potential to Achieve Goals: Good Progress towards PT goals: Progressing toward goals    Frequency    7X/week      PT Plan Current plan remains appropriate    Co-evaluation              AM-PAC PT "6 Clicks" Mobility   Outcome Measure  Help needed turning from your back to your side while in a flat bed without using bedrails?: A Little Help needed moving from lying on your back to sitting on the side of a flat bed without using bedrails?: A Little Help needed moving to and from a bed to a chair (including a wheelchair)?: A Little Help needed standing up from a chair using your arms (e.g., wheelchair or bedside chair)?: A Little Help needed to walk in hospital room?: A Little Help needed climbing 3-5 steps with a railing? : A Lot 6 Click Score: 17    End of Session Equipment Utilized During Treatment: Gait belt;Left knee immobilizer Activity Tolerance: Patient tolerated treatment well Patient left: in chair;with call bell/phone within reach;with family/visitor present Nurse Communication: Mobility status PT Visit Diagnosis: Unsteadiness on feet (R26.81);Difficulty in walking, not elsewhere classified (R26.2)     Time: 8338-2505 PT Time Calculation (min) (ACUTE ONLY): 38 min  Charges:  $Gait Training: 8-22 mins $Therapeutic Exercise: 8-22 mins                     [place] Pager [phone] Office [phone]    Carmen Vallecillo 08/12/2021, 9:05 AM

## 2021-08-12 NOTE — Discharge Summary (Signed)
In most cases prophylactic antibiotics for Dental procdeures after total joint surgery are not necessary.  Exceptions are as follows:  1. History of prior total joint infection  2. Severely immunocompromised (Organ Transplant, cancer chemotherapy, Rheumatoid biologic meds such as [place])  3. Poorly controlled diabetes (A1C &gt; 8.0, blood glucose over 200)  If you have one of these conditions, contact your surgeon for an antibiotic prescription, prior to your dental procedure. Orthopedic Discharge Summary        Physician Discharge Summary  Patient ID: Bradley Novak MRN: [id number] DOB/AGE: 06/20/1950 71 y.o.  Admit date: 08/11/2021 Discharge date: 08/12/2021   Procedures:  Procedure(s) (LRB): TOTAL KNEE ARTHROPLASTY (Left)  Attending Physician:  Dr. Esmond Plants  Admission Diagnoses:   left knee end stage osteoarthritis  Discharge Diagnoses:  left knee end stage osteoarthritis   Past Medical History:  Diagnosis Date   Contact lens/glasses fitting    Dysrhythmia    GERD (gastroesophageal reflux disease)    History of chemotherapy    1 yr chemo completed 03/2018- in remission per patient   History of hiatal hernia    Hodgkin's lymphoma ([facility]) 09/2017   Hyperlipidemia    Hypertension 2006   Obesity    Pneumonia 09/2017   SVT (supraventricular tachycardia) ([facility])    Wears glasses     PCP: Carlena Hurl, PA-C   Discharged Condition: good  Hospital Course:  Patient underwent the above stated procedure on 08/11/2021. Patient tolerated the procedure well and brought to the recovery room in good condition and subsequently to the floor. Patient had an uncomplicated hospital course and was stable for discharge.   Disposition: Discharge disposition: 01-Home or Self Care      with follow up in 2 weeks    Follow-up Information     Netta Cedars, MD. Go on 08/22/2021.   Specialty: Orthopedic Surgery Why: You are scheduled for first post op appointment  on Tuesday August 8th at 2:30pm. Contact information: [address] [place] [address] [place] [address] [phone]                 Dental Antibiotics:  In most cases prophylactic antibiotics for Dental procdeures after total joint surgery are not necessary.  Exceptions are as follows:  1. History of prior total joint infection  2. Severely immunocompromised (Organ Transplant, cancer chemotherapy, Rheumatoid biologic meds such as [place])  3. Poorly controlled diabetes (A1C &gt; 8.0, blood glucose over 200)  If you have one of these conditions, contact your surgeon for an antibiotic prescription, prior to your dental procedure.  Discharge Instructions     Call MD / Call 911   Complete by: As directed    If you experience chest pain or shortness of breath, CALL 911 and be transported to the hospital emergency room.  If you develope a fever above 101 F, pus (white drainage) or increased drainage or redness at the wound, or calf pain, call your surgeon's office.   Constipation Prevention   Complete by: As directed    Drink plenty of fluids.  Prune juice may be helpful.  You may use a stool softener, such as Colace (over the counter) 100 mg twice a day.  Use MiraLax (over the counter) for constipation as needed.   Diet - low sodium heart healthy   Complete by: As directed    Increase activity slowly as tolerated   Complete by: As directed    Post-operative opioid taper instructions:   Complete by: As directed  POST-OPERATIVE OPIOID TAPER INSTRUCTIONS: It is important to wean off of your opioid medication as soon as possible. If you do not need pain medication after your surgery it is ok to stop day one. Opioids include: Codeine, Hydrocodone(Norco, Vicodin), Oxycodone(Percocet, oxycontin) and hydromorphone amongst others.  Long term and even short term use of opiods can cause: Increased pain response Dependence Constipation Depression Respiratory depression And  more.  Withdrawal symptoms can include Flu like symptoms Nausea, vomiting And more Techniques to manage these symptoms Hydrate well Eat regular healthy meals Stay active Use relaxation techniques(deep breathing, meditating, yoga) Do Not substitute Alcohol to help with tapering If you have been on opioids for less than two weeks and do not have pain than it is ok to stop all together.  Plan to wean off of opioids This plan should start within one week post op of your joint replacement. Maintain the same interval or time between taking each dose and first decrease the dose.  Cut the total daily intake of opioids by one tablet each day Next start to increase the time between doses. The last dose that should be eliminated is the evening dose.          Allergies as of 08/12/2021   No Known Allergies      Medication List     STOP taking these medications    acetaminophen 500 MG tablet Commonly known as: TYLENOL   Menthol (Topical Analgesic) 10 % Liqd       TAKE these medications    aspirin 81 MG chewable tablet Commonly known as: Aspirin Childrens Chew 1 tablet (81 mg total) by mouth 2 (two) times daily.   atorvastatin 20 MG tablet Commonly known as: LIPITOR TAKE 1 TABLET BY MOUTH DAILY What changed:  how much to take how to take this when to take this additional instructions   carvedilol 6.25 MG tablet Commonly known as: COREG Take 1 tablet (6.25 mg total) by mouth 2 (two) times daily.   CRANBERRY PO Take 1 capsule by mouth daily.   diltiazem 120 MG 24 hr capsule Commonly known as: Cardizem CD Take 1 capsule (120 mg total) by mouth daily as needed. What changed: reasons to take this   diltiazem 30 MG tablet Commonly known as: Cardizem Take 1 tablet (30 mg total) by mouth 2 (two) times daily as needed (daily PRN for palpitations).   esomeprazole 20 MG capsule Commonly known as: NEXIUM Take 20 mg by mouth 2 (two) times daily.   finasteride 5 MG  tablet Commonly known as: PROSCAR TAKE ONE TABLET BY MOUTH ONCE DAILY   losartan 100 MG tablet Commonly known as: COZAAR Take 1 tablet (100 mg total) by mouth daily.   methocarbamol 750 MG tablet Commonly known as: Robaxin-750 Take 1 tablet (750 mg total) by mouth every 8 (eight) hours as needed for muscle spasms.   nitroGLYCERIN 0.3 MG SL tablet Commonly known as: Nitrostat Place 1 tablet (0.3 mg total) under the tongue every 5 (five) minutes as needed for chest pain.   ondansetron 4 MG tablet Commonly known as: Zofran Take 1 tablet (4 mg total) by mouth daily as needed for nausea or vomiting.   oxyCODONE-acetaminophen 5-325 MG tablet Commonly known as: Percocet Take 1 tablet by mouth every 4 (four) hours as needed for severe pain.   tadalafil 20 MG tablet Commonly known as: CIALIS TAKE ONE TABLET BY MOUTH DAILY AS NEEDED FOR ERECTILE DYSFUNCTION What changed:  how much to take how to take  this when to take this reasons to take this additional instructions          Signed: Ventura Bruns 08/12/2021, 8:48 AM  [facility] is now [facility] [address] [place], [place],  [address] Phone: [place]

## 2021-08-14 ENCOUNTER — Encounter (HOSPITAL_COMMUNITY): Payer: Self-pay | Admitting: Orthopedic Surgery

## 2021-08-14 DIAGNOSIS — M25562 Pain in left knee: Secondary | ICD-10-CM | POA: Diagnosis not present

## 2021-08-16 DIAGNOSIS — M25562 Pain in left knee: Secondary | ICD-10-CM | POA: Diagnosis not present

## 2021-08-18 DIAGNOSIS — M25562 Pain in left knee: Secondary | ICD-10-CM | POA: Diagnosis not present

## 2021-08-23 DIAGNOSIS — M25562 Pain in left knee: Secondary | ICD-10-CM | POA: Diagnosis not present

## 2021-08-24 DIAGNOSIS — Z4789 Encounter for other orthopedic aftercare: Secondary | ICD-10-CM | POA: Diagnosis not present

## 2021-08-24 DIAGNOSIS — M1711 Unilateral primary osteoarthritis, right knee: Secondary | ICD-10-CM | POA: Diagnosis not present

## 2021-08-25 DIAGNOSIS — M25562 Pain in left knee: Secondary | ICD-10-CM | POA: Diagnosis not present

## 2021-08-29 DIAGNOSIS — M25562 Pain in left knee: Secondary | ICD-10-CM | POA: Diagnosis not present

## 2021-09-01 DIAGNOSIS — M25562 Pain in left knee: Secondary | ICD-10-CM | POA: Diagnosis not present

## 2021-09-04 ENCOUNTER — Ambulatory Visit (INDEPENDENT_AMBULATORY_CARE_PROVIDER_SITE_OTHER): Payer: Medicare Other | Admitting: Medical

## 2021-09-04 ENCOUNTER — Other Ambulatory Visit: Payer: Self-pay | Admitting: Medical

## 2021-09-04 VITALS — BP 130/82 | HR 145 | Temp 98.2°F | Wt 253.8 lb

## 2021-09-04 DIAGNOSIS — N401 Enlarged prostate with lower urinary tract symptoms: Secondary | ICD-10-CM | POA: Diagnosis not present

## 2021-09-04 DIAGNOSIS — Z8572 Personal history of non-Hodgkin lymphomas: Secondary | ICD-10-CM | POA: Diagnosis not present

## 2021-09-04 DIAGNOSIS — R300 Dysuria: Secondary | ICD-10-CM

## 2021-09-04 DIAGNOSIS — M25562 Pain in left knee: Secondary | ICD-10-CM | POA: Diagnosis not present

## 2021-09-04 DIAGNOSIS — I2510 Atherosclerotic heart disease of native coronary artery without angina pectoris: Secondary | ICD-10-CM

## 2021-09-04 DIAGNOSIS — I2583 Coronary atherosclerosis due to lipid rich plaque: Secondary | ICD-10-CM | POA: Diagnosis not present

## 2021-09-04 DIAGNOSIS — R3911 Hesitancy of micturition: Secondary | ICD-10-CM

## 2021-09-04 DIAGNOSIS — Z9221 Personal history of antineoplastic chemotherapy: Secondary | ICD-10-CM

## 2021-09-04 LAB — POCT URINALYSIS DIP (PROADVANTAGE DEVICE)
Glucose, UA: NEGATIVE mg/dL
Nitrite, UA: POSITIVE — AB
Protein Ur, POC: 100 mg/dL — AB
Specific Gravity, Urine: 1.03
Urobilinogen, Ur: NEGATIVE
pH, UA: 6

## 2021-09-04 MED ORDER — SULFAMETHOXAZOLE-TRIMETHOPRIM 800-160 MG PO TABS: 1.0000 | ORAL_TABLET | Freq: Two times a day (BID) | ORAL | 0 refills | Status: DC

## 2021-09-04 NOTE — Addendum Note (Signed)
Addended by: Minette Headland A on: 09/04/2021 03:04 PM   Modules accepted: Orders

## 2021-09-04 NOTE — Progress Notes (Signed)
Subjective:  Bradley Novak is a 71 y.o. male who presents for Chief Complaint  Patient presents with   urinary issues    Had knee replacement Friday July 28th -not sure if he was cath or not but having issue with it burning, frequency     Here for concern for possible UTI.  Just had left knee surgery 3 weeks ago.  Since then has some burning with urination at beginning of the urine stream, had odor of urine, and color looks more concentrated.   Having urinary frequency.    Has hx/o urine stream issues with BPH.  Proscar didn't help much, then tried OTC prostate medication but nothing helps.  He self referred to urology , has appt next month in a few weeks.    Takes a B12 supplement daily and says the supplement is yellow and makes urine very yellow colored.    No other aggravating or relieving factors.    No other c/o.  Past Medical History:  Diagnosis Date   Contact lens/glasses fitting    Dysrhythmia    GERD (gastroesophageal reflux disease)    History of chemotherapy    1 yr chemo completed 03/2018- in remission per patient   History of hiatal hernia    Hodgkin's lymphoma ([facility]) 09/2017   Hyperlipidemia    Hypertension 2006   Obesity    Pneumonia 09/2017   SVT (supraventricular tachycardia) ([facility])    Wears glasses    Current Outpatient Medications on File Prior to Visit  Medication Sig Dispense Refill   atorvastatin (LIPITOR) 20 MG tablet TAKE 1 TABLET BY MOUTH DAILY (Patient taking differently: Take 20 mg by mouth daily.) 90 tablet 3   carvedilol (COREG) 6.25 MG tablet Take 1 tablet (6.25 mg total) by mouth 2 (two) times daily. 180 tablet 3   CRANBERRY PO Take 1 capsule by mouth daily.     diltiazem (CARDIZEM CD) 120 MG 24 hr capsule Take 1 capsule (120 mg total) by mouth daily as needed. (Patient taking differently: Take 120 mg by mouth daily as needed (For SVT).) 90 capsule 3   diltiazem (CARDIZEM) 30 MG tablet Take 1 tablet (30 mg total) by mouth 2 (two) times daily as  needed (daily PRN for palpitations). 90 tablet 1   esomeprazole (NEXIUM) 20 MG capsule Take 20 mg by mouth 2 (two) times daily.     finasteride (PROSCAR) 5 MG tablet TAKE ONE TABLET BY MOUTH ONCE DAILY (Patient taking differently: Take 5 mg by mouth daily.) 90 tablet 0   losartan (COZAAR) 100 MG tablet Take 1 tablet (100 mg total) by mouth daily. 90 tablet 2   methocarbamol (ROBAXIN-750) 750 MG tablet Take 1 tablet (750 mg total) by mouth every 8 (eight) hours as needed for muscle spasms. 40 tablet 1   nitroGLYCERIN (NITROSTAT) 0.3 MG SL tablet Place 1 tablet (0.3 mg total) under the tongue every 5 (five) minutes as needed for chest pain. 30 tablet 0   tadalafil (CIALIS) 20 MG tablet TAKE ONE TABLET BY MOUTH DAILY AS NEEDED FOR ERECTILE DYSFUNCTION (Patient taking differently: Take 20 mg by mouth daily as needed for erectile dysfunction.) 60 tablet 5   oxyCODONE-acetaminophen (PERCOCET) 5-325 MG tablet Take 1 tablet by mouth every 4 (four) hours as needed for severe pain. (Patient not taking: Reported on 09/04/2021) 20 tablet 0   No current facility-administered medications on file prior to visit.     The following portions of the patient's history were reviewed and updated as  appropriate: allergies, current medications, past family history, past medical history, past social history, past surgical history and problem list.  ROS Otherwise as in subjective above    Objective: BP 130/82   Pulse (!) 145 Comment: has SVT/ and PT today  Temp 98.2 F (36.8 C)   Wt 253 lb 12.5 oz (115.1 kg)   BMI 36.41 kg/m   General appearance: alert, no distress, well developed, well nourished Abdomen: +bs, soft, non tender, non distended, no masses, no hepatomegaly, no splenomegaly Skin:  warm, afebrile Ext: no edema   Assessment: Encounter Diagnoses  Name Primary?   Dysuria Yes   Burning with urination    Benign prostatic hyperplasia with urinary hesitancy    History of lymphoma    History of  chemotherapy      Plan: Dysuria, recent urine catheter with surgery - begin Bactrim, urine culture today.  Plan for repeat urinalysis with urology in a few weeks  Sees urology in September for BPH symptoms.  Has failed saw [facility], super beta prostate and Proscar.  I recommended he discontinue his over-the-counter B12 supplements for the time being.  He says it is a distinctly gold color in the urine is abnormal yellowish color in general so there might be added labs that he does not need.  Advise he to discontinue this or try different B12 supplement plan for the time being once his urine clears up.  [name] was seen today for urinary issues.  Diagnoses and all orders for this visit:  Dysuria -     Urine Culture  Burning with urination -     Urine Culture  Benign prostatic hyperplasia with urinary hesitancy  History of lymphoma  History of chemotherapy  Other orders -     sulfamethoxazole-trimethoprim (BACTRIM DS) 800-160 MG tablet; Take 1 tablet by mouth 2 (two) times daily.   Follow up: pending culture, urology f/u

## 2021-09-06 ENCOUNTER — Other Ambulatory Visit: Payer: Self-pay

## 2021-09-06 LAB — URINE CULTURE

## 2021-09-06 MED ORDER — FINASTERIDE 5 MG PO TABS: 5.0000 mg | ORAL_TABLET | Freq: Every day | ORAL | 0 refills | Status: DC

## 2021-09-07 ENCOUNTER — Other Ambulatory Visit: Payer: Self-pay | Admitting: Medical

## 2021-09-07 DIAGNOSIS — R300 Dysuria: Secondary | ICD-10-CM

## 2021-09-07 DIAGNOSIS — M25562 Pain in left knee: Secondary | ICD-10-CM | POA: Diagnosis not present

## 2021-09-07 MED ORDER — NITROFURANTOIN MONOHYD MACRO 100 MG PO CAPS: 100.0000 mg | ORAL_CAPSULE | Freq: Two times a day (BID) | ORAL | 0 refills | Status: DC

## 2021-09-12 DIAGNOSIS — M25562 Pain in left knee: Secondary | ICD-10-CM | POA: Diagnosis not present

## 2021-09-15 DIAGNOSIS — N529 Male erectile dysfunction, unspecified: Secondary | ICD-10-CM | POA: Diagnosis not present

## 2021-09-15 DIAGNOSIS — R3915 Urgency of urination: Secondary | ICD-10-CM | POA: Diagnosis not present

## 2021-09-19 DIAGNOSIS — M25562 Pain in left knee: Secondary | ICD-10-CM | POA: Diagnosis not present

## 2021-09-19 DIAGNOSIS — E291 Testicular hypofunction: Secondary | ICD-10-CM | POA: Diagnosis not present

## 2021-09-21 ENCOUNTER — Telehealth: Payer: Self-pay | Admitting: Medical

## 2021-09-21 DIAGNOSIS — M25562 Pain in left knee: Secondary | ICD-10-CM | POA: Diagnosis not present

## 2021-09-21 NOTE — Telephone Encounter (Signed)
Left message for patient to call back and schedule Medicare Annual Wellness Visit (AWV) either virtually or in office. I left my number for patient to call [phone].  awvi 12/15/17 per [facility]  ; please schedule at anytime with health coach  This should be a 45 minute visit.

## 2021-09-26 DIAGNOSIS — Z23 Encounter for immunization: Secondary | ICD-10-CM | POA: Diagnosis not present

## 2021-10-13 ENCOUNTER — Telehealth: Payer: Self-pay | Admitting: Licensed Clinical Social Worker

## 2021-10-13 NOTE — Patient Outreach (Signed)
  Care Coordination   10/13/2021 Name: Bradley Novak MRN: [id number] DOB: 29-Oct-1950   Care Coordination Outreach Attempts:  An unsuccessful telephone outreach was attempted today to offer the patient information about available care coordination services as a benefit of their health plan.   Follow Up Plan:  Additional outreach attempts will be made to offer the patient care coordination information and services.   Encounter Outcome:  No Answer  Care Coordination Interventions Activated:  No   Care Coordination Interventions:  No, not indicated    Christa See, MSW, [email or web address] Phone [phone] 3:27 PM

## 2021-10-24 ENCOUNTER — Encounter: Payer: Self-pay | Admitting: Internal Medicine

## 2021-10-24 ENCOUNTER — Ambulatory Visit (INDEPENDENT_AMBULATORY_CARE_PROVIDER_SITE_OTHER): Payer: Medicare Other | Admitting: Medical

## 2021-10-24 VITALS — BP 118/80 | HR 76 | Wt 261.0 lb

## 2021-10-24 DIAGNOSIS — I2510 Atherosclerotic heart disease of native coronary artery without angina pectoris: Secondary | ICD-10-CM | POA: Diagnosis not present

## 2021-10-24 DIAGNOSIS — I10 Essential (primary) hypertension: Secondary | ICD-10-CM | POA: Diagnosis not present

## 2021-10-24 DIAGNOSIS — I2583 Coronary atherosclerosis due to lipid rich plaque: Secondary | ICD-10-CM

## 2021-10-24 DIAGNOSIS — N529 Male erectile dysfunction, unspecified: Secondary | ICD-10-CM

## 2021-10-24 DIAGNOSIS — M7989 Other specified soft tissue disorders: Secondary | ICD-10-CM | POA: Diagnosis not present

## 2021-10-24 DIAGNOSIS — I700 Atherosclerosis of aorta: Secondary | ICD-10-CM

## 2021-10-24 DIAGNOSIS — R195 Other fecal abnormalities: Secondary | ICD-10-CM | POA: Diagnosis not present

## 2021-10-24 DIAGNOSIS — M170 Bilateral primary osteoarthritis of knee: Secondary | ICD-10-CM | POA: Diagnosis not present

## 2021-10-24 DIAGNOSIS — Z862 Personal history of diseases of the blood and blood-forming organs and certain disorders involving the immune mechanism: Secondary | ICD-10-CM

## 2021-10-24 DIAGNOSIS — I471 Supraventricular tachycardia, unspecified: Secondary | ICD-10-CM

## 2021-10-24 NOTE — Progress Notes (Signed)
Subjective:  Bradley Novak is a 71 y.o. male who presents for Chief Complaint  Patient presents with   stomach cramps    Stomach cramps and diarrhea for the last 2-3 weeks.      Last few weeks acute diarrhea and stomach cramps.   Negative for home covid tests . No URI symptoms.   No change in activity or diet.  The only recent changes was adding Cialis daily per urology, last 6 weeks.   Today hasn't had any diarrhea, but for last 2-3 weeks, several morning stomach cramps, severe diarrhea.  Having probably 2-4 loose stools daily, mostly in the morning from 7am - 10am.   Rest of the day usually good.  Having to use some imodium.  No blood in stool.   Didn't take Nexium for a few days, didn't take losartan a few days just to see if that would change anything.  It didn't change the diarrhea.  This morning pulse 61.  Runs 50-60s typically.  No recent exposure to hospital bound persons or sick contacts.  No nursing home contacts.  Had knee surgery months ago but still having some leg swelling.  No chest pain, no sob  No other aggravating or relieving factors.    No other c/o.  Past Medical History:  Diagnosis Date   Contact lens/glasses fitting    Dysrhythmia    GERD (gastroesophageal reflux disease)    History of chemotherapy    1 yr chemo completed 03/2018- in remission per patient   History of hiatal hernia    Hodgkin's lymphoma ([facility]) 09/2017   Hyperlipidemia    Hypertension 2006   Obesity    Pneumonia 09/2017   SVT (supraventricular tachycardia) ([facility])    Wears glasses    Current Outpatient Medications on File Prior to Visit  Medication Sig Dispense Refill   atorvastatin (LIPITOR) 20 MG tablet TAKE 1 TABLET BY MOUTH DAILY (Patient taking differently: Take 20 mg by mouth daily.) 90 tablet 3   carvedilol (COREG) 6.25 MG tablet Take 1 tablet (6.25 mg total) by mouth 2 (two) times daily. 180 tablet 3   CRANBERRY PO Take 1 capsule by mouth daily.     esomeprazole (NEXIUM) 20  MG capsule Take 20 mg by mouth 2 (two) times daily.     finasteride (PROSCAR) 5 MG tablet Take 1 tablet (5 mg total) by mouth daily. 90 tablet 0   losartan (COZAAR) 100 MG tablet Take 1 tablet (100 mg total) by mouth daily. 90 tablet 2   methocarbamol (ROBAXIN-750) 750 MG tablet Take 1 tablet (750 mg total) by mouth every 8 (eight) hours as needed for muscle spasms. 40 tablet 1   nitroGLYCERIN (NITROSTAT) 0.3 MG SL tablet Place 1 tablet (0.3 mg total) under the tongue every 5 (five) minutes as needed for chest pain. 30 tablet 0   tadalafil (CIALIS) 20 MG tablet TAKE ONE TABLET BY MOUTH DAILY AS NEEDED FOR ERECTILE DYSFUNCTION (Patient taking differently: Take 20 mg by mouth daily as needed for erectile dysfunction.) 60 tablet 5   tadalafil (CIALIS) 5 MG tablet Take 1 tablet by mouth daily.     diltiazem (CARDIZEM CD) 120 MG 24 hr capsule Take 1 capsule (120 mg total) by mouth daily as needed. (Patient not taking: Reported on 10/24/2021) 90 capsule 3   diltiazem (CARDIZEM) 30 MG tablet Take 1 tablet (30 mg total) by mouth 2 (two) times daily as needed (daily PRN for palpitations). (Patient not taking: Reported on 10/24/2021) 90 tablet  1   No current facility-administered medications on file prior to visit.     The following portions of the patient's history were reviewed and updated as appropriate: allergies, current medications, past family history, past medical history, past social history, past surgical history and problem list.  ROS Otherwise as in subjective above  Objective: BP 118/80   Pulse 76 Comment: has SVT  Wt 261 lb (118.4 kg)   SpO2 97%   BMI 37.45 kg/m   Wt Readings from Last 3 Encounters:  10/24/21 261 lb (118.4 kg)  09/04/21 253 lb 12.5 oz (115.1 kg)  08/11/21 264 lb 8.8 oz (120 kg)    General appearance: alert, no distress, well developed, well nourished Oral cavity: MMM, no lesions Neck: supple, no lymphadenopathy, no thyromegaly, no masses Heart: RRR, normal S1,  S2, no murmurs Lungs: CTA bilaterally, no wheezes, rhonchi, or rales Abdomen: +bs, soft, non tender, non distended, no masses, no hepatomegaly, no splenomegaly Pulses: 2+ radial pulses, 2+ pedal pulses, normal cap refill Ext: 1+ bilat LE mildly pitting edema, no calve tendnerss, no palpable cord or discoloration, legs nontender   Assessment: Encounter Diagnoses  Name Primary?   Loose stools Yes   Leg swelling    SVT (supraventricular tachycardia)    Atherosclerosis of aorta (HCC)    Arthritis of both knees    Essential hypertension    Erectile dysfunction, unspecified erectile dysfunction type    History of anemia      Plan: We discussed possible causes of loose stools.  Given that is only in the morning it may be more related to what ever he is eating or medication.  Less likely infectious but we will go ahead and check some labs and stool as ordered below  Consider short-term stopping Cialis daily to see if that makes a difference in his diarrhea has been one of the reported side effects  Continue other medicines as usual  He initially had elevated pulse rate with a quick brief SVT on triage but his pulse quickly was back to normal  Continue to hydrate well  Lower extremity edema-he has been on diuretics before but then started to have abnormal kidney function.  Labs today, continue to monitor, work on leg elevation, consider compression socks or hose     Bradley Novak was seen today for stomach cramps.  Diagnoses and all orders for this visit:  Loose stools -     Cancel: GI Profile, Stool, PCR -     Comprehensive metabolic panel -     TSH -     CBC -     GI Profile, Stool, PCR; Future  Leg swelling -     Comprehensive metabolic panel  SVT (supraventricular tachycardia)  Atherosclerosis of aorta (HCC)  Arthritis of both knees  Essential hypertension  Erectile dysfunction, unspecified erectile dysfunction type  History of anemia -     CBC    Follow up:  pending labs

## 2021-10-25 DIAGNOSIS — C8190 Hodgkin lymphoma, unspecified, unspecified site: Secondary | ICD-10-CM | POA: Diagnosis not present

## 2021-10-25 DIAGNOSIS — R195 Other fecal abnormalities: Secondary | ICD-10-CM | POA: Diagnosis not present

## 2021-10-25 LAB — CBC
Hematocrit: 41.1 % (ref 37.5–51.0)
Hemoglobin: 13.5 g/dL (ref 13.0–17.7)
MCH: 29.3 pg (ref 26.6–33.0)
MCHC: 32.8 g/dL (ref 31.5–35.7)
MCV: 89 fL (ref 79–97)
Platelets: 237 10*3/uL (ref 150–450)
RBC: 4.61 x10E6/uL (ref 4.14–5.80)
RDW: 12.6 % (ref 11.6–15.4)
WBC: 8.6 10*3/uL (ref 3.4–10.8)

## 2021-10-25 LAB — COMPREHENSIVE METABOLIC PANEL
ALT: 20 IU/L (ref 0–44)
AST: 17 IU/L (ref 0–40)
Albumin/Globulin Ratio: 1.5 (ref 1.2–2.2)
Albumin: 4.2 g/dL (ref 3.9–4.9)
Alkaline Phosphatase: 87 IU/L (ref 44–121)
BUN/Creatinine Ratio: 11 (ref 10–24)
BUN: 15 mg/dL (ref 8–27)
Bilirubin Total: 0.5 mg/dL (ref 0.0–1.2)
CO2: 24 mmol/L (ref 20–29)
Calcium: 9.7 mg/dL (ref 8.6–10.2)
Chloride: 100 mmol/L (ref 96–106)
Creatinine, Ser: 1.34 mg/dL — ABNORMAL HIGH (ref 0.76–1.27)
Globulin, Total: 2.8 g/dL (ref 1.5–4.5)
Glucose: 139 mg/dL — ABNORMAL HIGH (ref 70–99)
Potassium: 4.5 mmol/L (ref 3.5–5.2)
Sodium: 136 mmol/L (ref 134–144)
Total Protein: 7 g/dL (ref 6.0–8.5)
eGFR: 57 mL/min/{1.73_m2} — ABNORMAL LOW (ref >59)

## 2021-10-25 LAB — TSH: TSH: 2.17 u[IU]/mL (ref 0.450–4.500)

## 2021-10-26 DIAGNOSIS — R3915 Urgency of urination: Secondary | ICD-10-CM | POA: Diagnosis not present

## 2021-10-26 DIAGNOSIS — N529 Male erectile dysfunction, unspecified: Secondary | ICD-10-CM | POA: Diagnosis not present

## 2021-10-28 LAB — GI PROFILE, STOOL, PCR

## 2021-10-30 ENCOUNTER — Other Ambulatory Visit: Payer: Self-pay | Admitting: Medical

## 2021-10-30 ENCOUNTER — Encounter: Payer: Self-pay | Admitting: Internal Medicine

## 2021-10-31 ENCOUNTER — Ambulatory Visit: Payer: Self-pay

## 2021-10-31 NOTE — Patient Outreach (Signed)
  Care Coordination   Initial Visit Note   10/31/2021 Name: Bradley Novak MRN: [id number] DOB: Jun 13, 1950  Bradley Novak is a 71 y.o. year old male who sees Tysinger, Camelia Eng, PA-C for primary care. I  spoke with patients spouse by phone today  What matters to the patients health and wellness today?  No concerns, doing well at this time    Goals Addressed             This Visit's Progress    COMPLETED: Care Coordination Activities       Care Coordination Interventions: Determined the patient does not have concerns with social determinants of health Discussed patient is doing well at this time, has an upcomming knee replacement scheduled and support from spouse Encouraged to contact primary care provider as needed         SDOH assessments and interventions completed:  Yes  SDOH Interventions Today    Flowsheet Row Most Recent Value  SDOH Interventions   Food Insecurity Interventions Intervention Not Indicated  Housing Interventions Intervention Not Indicated  Transportation Interventions Intervention Not Indicated        Care Coordination Interventions Activated:  Yes  Care Coordination Interventions:  Yes, provided   Follow up plan: No further intervention required.   Encounter Outcome:  Pt. Visit Completed   Daneen Schick, BSW, CDP Social Worker, Certified Dementia Practitioner [facility]  Care Coordination [phone]

## 2021-10-31 NOTE — Patient Instructions (Signed)
Visit Information  Thank you for taking time to visit with me today. Please don't hesitate to contact me if I can be of assistance to you.   Following are the goals we discussed today:   Goals Addressed             This Visit's Progress    COMPLETED: Care Coordination Activities       Care Coordination Interventions: Determined the patient does not have concerns with social determinants of health Discussed patient is doing well at this time, has an upcomming knee replacement scheduled and support from spouse Encouraged to contact primary care provider as needed         If you are experiencing a Mental Health or [place] or need someone to talk to, please call 1-800-273-TALK (toll free, 24 hour hotline)  Patient verbalizes understanding of instructions and care plan provided today and agrees to view in [place]. Active MyChart status and patient understanding of how to access instructions and care plan via MyChart confirmed with patient.     No further follow up required: Please contact your primary care provider as needed.  Daneen Schick, BSW, CDP Social Worker, Certified Dementia Practitioner [facility]  Care Coordination [phone]

## 2021-11-02 ENCOUNTER — Other Ambulatory Visit: Payer: Self-pay | Admitting: Medical

## 2021-11-08 ENCOUNTER — Telehealth: Payer: Self-pay | Admitting: Medical

## 2021-11-08 ENCOUNTER — Ambulatory Visit: Payer: Medicare Other | Admitting: Hematology & Oncology

## 2021-11-08 ENCOUNTER — Inpatient Hospital Stay: Payer: Medicare Other

## 2021-11-08 NOTE — Telephone Encounter (Signed)
Left message for patient to call back and schedule Medicare Annual Wellness Visit (AWV) either virtually or in office. Left  my jabber number [phone]    awvi 12/15/17 per [facility]  please schedule with Nurse Health Adviser   45 min for awv-i and in office appointments 30 min for awv-s  phone/virtual appointments  

## 2021-11-14 ENCOUNTER — Inpatient Hospital Stay: Payer: Medicare Other | Attending: Hematology & Oncology | Admitting: Medical Oncology

## 2021-11-14 ENCOUNTER — Inpatient Hospital Stay: Payer: Medicare Other

## 2021-11-14 ENCOUNTER — Other Ambulatory Visit: Payer: Self-pay

## 2021-11-14 ENCOUNTER — Encounter: Payer: Self-pay | Admitting: Medical Oncology

## 2021-11-14 VITALS — BP 168/84 | HR 59 | Temp 98.4°F | Resp 18 | Wt 258.0 lb

## 2021-11-14 DIAGNOSIS — C187 Malignant neoplasm of sigmoid colon: Secondary | ICD-10-CM | POA: Diagnosis not present

## 2021-11-14 DIAGNOSIS — Z8571 Personal history of Hodgkin lymphoma: Secondary | ICD-10-CM | POA: Diagnosis not present

## 2021-11-14 DIAGNOSIS — K635 Polyp of colon: Secondary | ICD-10-CM

## 2021-11-14 DIAGNOSIS — C8100 Nodular lymphocyte predominant Hodgkin lymphoma, unspecified site: Secondary | ICD-10-CM

## 2021-11-14 DIAGNOSIS — C8104 Nodular lymphocyte predominant Hodgkin lymphoma, lymph nodes of axilla and upper limb: Secondary | ICD-10-CM | POA: Diagnosis not present

## 2021-11-14 LAB — CMP (CANCER CENTER ONLY)
ALT: 15 U/L (ref 0–44)
AST: 15 U/L (ref 15–41)
Albumin: 3.9 g/dL (ref 3.5–5.0)
Alkaline Phosphatase: 71 U/L (ref 38–126)
Anion gap: 6 (ref 5–15)
BUN: 17 mg/dL (ref 8–23)
CO2: 27 mmol/L (ref 22–32)
Calcium: 9.3 mg/dL (ref 8.9–10.3)
Chloride: 107 mmol/L (ref 98–111)
Creatinine: 1.34 mg/dL — ABNORMAL HIGH (ref 0.61–1.24)
GFR, Estimated: 57 mL/min — ABNORMAL LOW (ref >60)
Glucose, Bld: 113 mg/dL — ABNORMAL HIGH (ref 70–99)
Potassium: 3.9 mmol/L (ref 3.5–5.1)
Sodium: 140 mmol/L (ref 135–145)
Total Bilirubin: 0.4 mg/dL (ref 0.3–1.2)
Total Protein: 6.4 g/dL — ABNORMAL LOW (ref 6.5–8.1)

## 2021-11-14 LAB — CBC WITH DIFFERENTIAL (CANCER CENTER ONLY)
Abs Immature Granulocytes: 0.03 10*3/uL (ref 0.00–0.07)
Basophils Absolute: 0.1 10*3/uL (ref 0.0–0.1)
Basophils Relative: 1 %
Eosinophils Absolute: 0.1 10*3/uL (ref 0.0–0.5)
Eosinophils Relative: 1 %
HCT: 39.8 % (ref 39.0–52.0)
Hemoglobin: 13.1 g/dL (ref 13.0–17.0)
Immature Granulocytes: 0 %
Lymphocytes Relative: 18 %
Lymphs Abs: 1.8 10*3/uL (ref 0.7–4.0)
MCH: 29.9 pg (ref 26.0–34.0)
MCHC: 32.9 g/dL (ref 30.0–36.0)
MCV: 90.9 fL (ref 80.0–100.0)
Monocytes Absolute: 0.6 10*3/uL (ref 0.1–1.0)
Monocytes Relative: 6 %
Neutro Abs: 7 10*3/uL (ref 1.7–7.7)
Neutrophils Relative %: 74 %
Platelet Count: 210 10*3/uL (ref 150–400)
RBC: 4.38 MIL/uL (ref 4.22–5.81)
RDW: 12.5 % (ref 11.5–15.5)
WBC Count: 9.6 10*3/uL (ref 4.0–10.5)
nRBC: 0 % (ref 0.0–0.2)

## 2021-11-14 LAB — LACTATE DEHYDROGENASE: LDH: 114 U/L (ref 98–192)

## 2021-11-14 NOTE — Progress Notes (Signed)
Hematology and Oncology Follow Up Visit  Beatrice Sehgal [id number] 1950/06/27 71 y.o. 11/14/2021   Principle Diagnosis:  Stage III Classical Hodgkin's Lymphoma Stage I (T1N0M0) adenocarcinoma of the sigmoid colon  Current Therapy:   S/P ABVD x 51/2 cycles -- completed in 03/2018 S/P resection of tumor in 05/2018     Interim History:  Mr. Kowal is back for follow-up.  He reports that he is doing well. He continues to be followed closely by GI. He was having colonoscopies every 3 months; then every 6 months and now is yearly due to continued polyps. He denies any constipation, stool changes, unintentional weight loss, night sweats or enlargement of lymph nodes.   He is going to have his right knee replaced on Dec 1st which he is excited for.   Overall, I would have to say that his performance status is probably ECOG 1.    Wt Readings from Last 3 Encounters:  11/14/21 258 lb (117 kg)  10/24/21 261 lb (118.4 kg)  09/04/21 253 lb 12.5 oz (115.1 kg)    Medications:  Current Outpatient Medications:    atorvastatin (LIPITOR) 20 MG tablet, TAKE 1 TABLET BY MOUTH DAILY (Patient taking differently: Take 20 mg by mouth daily.), Disp: 90 tablet, Rfl: 3   carvedilol (COREG) 6.25 MG tablet, Take 1 tablet (6.25 mg total) by mouth 2 (two) times daily., Disp: 180 tablet, Rfl: 3   CRANBERRY PO, Take 1 capsule by mouth daily., Disp: , Rfl:    diltiazem (CARDIZEM CD) 120 MG 24 hr capsule, Take 1 capsule (120 mg total) by mouth daily as needed., Disp: 90 capsule, Rfl: 3   diltiazem (CARDIZEM) 30 MG tablet, Take 1 tablet (30 mg total) by mouth 2 (two) times daily as needed (daily PRN for palpitations)., Disp: 90 tablet, Rfl: 1   esomeprazole (NEXIUM) 20 MG capsule, Take 20 mg by mouth 2 (two) times daily., Disp: , Rfl:    finasteride (PROSCAR) 5 MG tablet, TAKE ONE TABLET BY MOUTH ONCE DAILY, Disp: 90 tablet, Rfl: 0   losartan (COZAAR) 100 MG tablet, TAKE ONE TABLET BY MOUTH ONCE DAILY, Disp: 90  tablet, Rfl: 0   methocarbamol (ROBAXIN) 750 MG tablet, Take 750 mg by mouth 3 (three) times daily as needed for muscle spasms., Disp: , Rfl:    nitroGLYCERIN (NITROSTAT) 0.3 MG SL tablet, Place 1 tablet (0.3 mg total) under the tongue every 5 (five) minutes as needed for chest pain., Disp: 30 tablet, Rfl: 0   tadalafil (CIALIS) 5 MG tablet, Take 5 mg by mouth daily as needed for erectile dysfunction., Disp: , Rfl:   Allergies: No Known Allergies  Past Medical History, Surgical history, Social history, and Family History were reviewed and updated.  Review of Systems: Review of Systems  Constitutional: Negative.   HENT:  Negative.    Eyes: Negative.   Respiratory: Negative.    Cardiovascular: Negative.   Gastrointestinal: Negative.   Endocrine: Negative.   Genitourinary: Negative.    Musculoskeletal: Negative.   Skin: Negative.   Neurological: Negative.   Hematological: Negative.   Psychiatric/Behavioral: Negative.      Physical Exam:  weight is 258 lb (117 kg). His oral temperature is 98.4 F (36.9 C). His blood pressure is 168/84 (abnormal) and his pulse is 59 (abnormal). His respiration is 18 and oxygen saturation is 96%.   Wt Readings from Last 3 Encounters:  11/14/21 258 lb (117 kg)  10/24/21 261 lb (118.4 kg)  09/04/21 253 lb 12.5 oz (115.1  kg)    Physical Exam Vitals reviewed.  HENT:     Head: Normocephalic and atraumatic.  Eyes:     Pupils: Pupils are equal, round, and reactive to light.  Cardiovascular:     Rate and Rhythm: Normal rate and regular rhythm.     Heart sounds: Normal heart sounds.  Pulmonary:     Effort: Pulmonary effort is normal.     Breath sounds: Normal breath sounds.  Abdominal:     General: Bowel sounds are normal.     Palpations: Abdomen is soft.  Musculoskeletal:        General: No tenderness or deformity. Normal range of motion.     Cervical back: Normal range of motion.  Lymphadenopathy:     Cervical: No cervical adenopathy.   Skin:    General: Skin is warm and dry.     Findings: No erythema or rash.  Neurological:     Mental Status: He is alert and oriented to person, place, and time.  Psychiatric:        Behavior: Behavior normal.        Thought Content: Thought content normal.        Judgment: Judgment normal.      Lab Results  Component Value Date   WBC 9.6 11/14/2021   HGB 13.1 11/14/2021   HCT 39.8 11/14/2021   MCV 90.9 11/14/2021   PLT 210 11/14/2021     Chemistry      Component Value Date/Time   NA 140 11/14/2021 0923   NA 136 10/24/2021 1340   K 3.9 11/14/2021 0923   CL 107 11/14/2021 0923   CO2 27 11/14/2021 0923   BUN 17 11/14/2021 0923   BUN 15 10/24/2021 1340   CREATININE 1.34 (H) 11/14/2021 0923   CREATININE 0.83 09/27/2014 0001      Component Value Date/Time   CALCIUM 9.3 11/14/2021 0923   ALKPHOS 71 11/14/2021 0923   AST 15 11/14/2021 0923   ALT 15 11/14/2021 0923   BILITOT 0.4 11/14/2021 0923      Impression and Plan: Encounter Diagnoses  Name Primary?   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 Burget is a very nice 71 year old white male.  He has 2 separate malignancies.    Lymphoma: No evidence of recurrence on exam today. Labs are reassuring. Per NCCN guidelines he does not require any additional CT surveillance at this time as there are no clinical concerns for relapse past the 2 year mark from treatment. We will continue seeing him every 6-12 months for monitoring.   Stage 1 adenocarcinoma of sigmoid colon: Has had colonoscopy at year 1 after surgery which did not show advanced adenoma. He then had another colonoscopy at year   Of note he will continue working with his PCP and cardiologist regarding his chronic health conditions.   Hughie Closs, PA-C 10/31/202310:17 AM

## 2021-11-14 NOTE — Addendum Note (Signed)
Addended by: Nelwyn Salisbury on: 11/14/2021 01:08 PM   Modules accepted: Orders

## 2021-11-27 ENCOUNTER — Ambulatory Visit: Payer: Medicare Other | Admitting: Cardiology

## 2021-11-28 NOTE — H&P (Signed)
Patient's anticipated LOS is less than 2 midnights, meeting these requirements: - Younger than 61 - Lives within 1 hour of care - Has a competent adult at home to recover with post-op recover - NO history of  - Chronic pain requiring opiods  - Diabetes  - Coronary Artery Disease  - Heart failure  - Heart attack  - Stroke  - DVT/VTE  - Cardiac arrhythmia  - Respiratory Failure/COPD  - Renal failure  - Anemia  - Advanced Liver disease     Bradley Novak is an 71 y.o. male.    Chief Complaint: right knee pain  HPI: Pt is a 71 y.o. male complaining of right knee pain for multiple years. Pain had continually increased since the beginning. X-rays in the clinic show end-stage arthritic changes of the right knee. Pt has tried various conservative treatments which have failed to alleviate their symptoms, including injections and therapy. Various options are discussed with the patient. Risks, benefits and expectations were discussed with the patient. Patient understand the risks, benefits and expectations and wishes to proceed with surgery.   PCP:  Carlena Hurl, PA-C  D/C Plans: Home  PMH: Past Medical History:  Diagnosis Date   Contact lens/glasses fitting    Dysrhythmia    GERD (gastroesophageal reflux disease)    History of chemotherapy    1 yr chemo completed 03/2018- in remission per patient   History of hiatal hernia    Hodgkin's lymphoma ([place]) 09/2017   Hyperlipidemia    Hypertension 2006   Obesity    Pneumonia 09/2017   SVT (supraventricular tachycardia)    Wears glasses     PSH: Past Surgical History:  Procedure Laterality Date   CARDIAC CATHETERIZATION  1980s   COLONOSCOPY  05/20/2019   Dr.Gupta   IR IMAGING GUIDED PORT INSERTION  11/20/2017   IR REMOVAL TUN ACCESS W/ PORT W/O FL MOD SED  06/02/2018   PORT-A-CATH REMOVAL     SIGMOIDOSCOPY  08/21/2019   Dr.Gupta   SVT ABLATION  01/29/2018   SVT ABLATION N/A 01/29/2018   Procedure: SVT ABLATION;   Surgeon: Constance Haw, MD;  Location: [place] [facility];  Service: Cardiovascular;  Laterality: N/A;   TOTAL KNEE ARTHROPLASTY Left 08/11/2021   Procedure: TOTAL KNEE ARTHROPLASTY;  Surgeon: Netta Cedars, MD;  Location: [facility];  Service: Orthopedics;  Laterality: Left;   WISDOM TOOTH EXTRACTION      Social History:  reports that he quit smoking about 49 years ago. His smoking use included cigarettes. He has a 10.00 pack-year smoking history. He has never used smokeless tobacco. He reports that he does not drink alcohol and does not use drugs. BMI: Estimated body mass index is 37.02 kg/m as calculated from the following:   Height as of 08/11/21: '5\' 10"'$  (1.778 m).   Weight as of 11/14/21: 117 kg.  Lab Results  Component Value Date   ALBUMIN 3.9 11/14/2021   Diabetes: Patient does not have a diagnosis of diabetes. Lab Results  Component Value Date   HGBA1C 5.4 05/24/2021     Smoking Status:      Allergies:  No Known Allergies  Medications: No current facility-administered medications for this encounter.   Current Outpatient Medications  Medication Sig Dispense Refill   atorvastatin (LIPITOR) 20 MG tablet TAKE 1 TABLET BY MOUTH DAILY (Patient taking differently: Take 20 mg by mouth daily.) 90 tablet 3   carvedilol (COREG) 6.25 MG tablet Take 1 tablet (6.25 mg total) by mouth  2 (two) times daily. 180 tablet 3   CRANBERRY PO Take 1 capsule by mouth daily.     diltiazem (CARDIZEM CD) 120 MG 24 hr capsule Take 1 capsule (120 mg total) by mouth daily as needed. 90 capsule 3   diltiazem (CARDIZEM) 30 MG tablet Take 1 tablet (30 mg total) by mouth 2 (two) times daily as needed (daily PRN for palpitations). 90 tablet 1   esomeprazole (NEXIUM) 20 MG capsule Take 20 mg by mouth 2 (two) times daily.     finasteride (PROSCAR) 5 MG tablet TAKE ONE TABLET BY MOUTH ONCE DAILY 90 tablet 0   losartan (COZAAR) 100 MG tablet TAKE ONE TABLET BY MOUTH ONCE DAILY 90 tablet 0   methocarbamol  (ROBAXIN) 750 MG tablet Take 750 mg by mouth 3 (three) times daily as needed for muscle spasms.     nitroGLYCERIN (NITROSTAT) 0.3 MG SL tablet Place 1 tablet (0.3 mg total) under the tongue every 5 (five) minutes as needed for chest pain. 30 tablet 0   tadalafil (CIALIS) 5 MG tablet Take 5 mg by mouth daily as needed for erectile dysfunction.      No results found for this or any previous visit (from the past 48 hour(s)). No results found.  ROS: Pain with rom of the right lower extremity  Physical Exam: Alert and oriented 71 y.o. male in no acute distress Cranial nerves 2-12 intact Cervical spine: full rom with no tenderness, nv intact distally Chest: active breath sounds bilaterally, no wheeze rhonchi or rales Heart: regular rate and rhythm, no murmur Abd: non tender non distended with active bowel sounds Hip is stable with rom  Right knee painful rom  Nv intact distally Antalgic gait  Assessment/Plan Assessment: right knee end stage osteoarthritis  Plan:  Patient will undergo a right total knee by Dr. Veverly Fells at [place]. Risks benefits and expectations were discussed with the patient. Patient understand risks, benefits and expectations and wishes to proceed. Preoperative templating of the joint replacement has been completed, documented, and submitted to the Operating Room personnel in order to optimize intra-operative equipment management.   Merla Riches PA-C, MPAS [facility] is now [employer] [address] [place], [place], [place] [address] Phone: [phone] [email or web address]  [employer]

## 2021-12-04 NOTE — Patient Instructions (Signed)
SURGICAL WAITING ROOM VISITATION Patients having surgery or a procedure may have no more than 2 support people in the waiting area - these visitors may rotate.   Children under the age of 75 must have an adult with them who is not the patient. If the patient needs to stay at the hospital during part of their recovery, the visitor guidelines for inpatient rooms apply. Pre-op nurse will coordinate an appropriate time for 1 support person to accompany patient in pre-op.  This support person may not rotate.    Please refer to the [facility] website for the visitor guidelines for Inpatients (after your surgery is over and you are in a regular room).       Your procedure is scheduled on:    Report to [facility] Main Entrance    Report to admitting at AM   Call this number if you have problems the morning of surgery [phone]   Do not eat food :After Midnight.   After Midnight you may have the following liquids until ______ AM/ PM DAY OF SURGERY  Water Non-Citrus Juices (without pulp, NO RED) Carbonated Beverages Black Coffee (NO MILK/CREAM OR CREAMERS, sugar ok)  Clear Tea (NO MILK/CREAM OR CREAMERS, sugar ok) regular and decaf                             Plain Jell-O (NO RED)                                           Fruit ices (not with fruit pulp, NO RED)                                     Popsicles (NO RED)                                                               Sports drinks like Gatorade (NO RED)              Drink 2 Ensure/G2 drinks AT 10:00 PM the night before surgery.        The day of surgery:  Drink ONE (1) Pre-Surgery Clear Ensure or G2 at AM the morning of surgery. Drink in one sitting. Do not sip.  This drink was given to you during your hospital  pre-op appointment visit. Nothing else to drink after completing the  Pre-Surgery Clear Ensure or G2.          If you have questions, please contact your surgeon's office.   FOLLOW BOWEL PREP AND ANY  ADDITIONAL PRE OP INSTRUCTIONS YOU RECEIVED FROM YOUR SURGEON'S OFFICE!!!     Oral Hygiene is also important to reduce your risk of infection.                                    Remember - BRUSH YOUR TEETH THE MORNING OF SURGERY WITH YOUR REGULAR TOOTHPASTE   Do NOT smoke after Midnight   Take these medicines the  morning of surgery with A SIP OF WATER:   DO NOT TAKE ANY ORAL DIABETIC MEDICATIONS DAY OF YOUR SURGERY  Bring CPAP mask and tubing day of surgery.                              You may not have any metal on your body including hair pins, jewelry, and body piercing             Do not wear make-up, lotions, powders, perfumes/cologne, or deodorant  Do not wear nail polish including gel and S&S, artificial/acrylic nails, or any other type of covering on natural nails including finger and toenails. If you have artificial nails, gel coating, etc. that needs to be removed by a nail salon please have this removed prior to surgery or surgery may need to be canceled/ delayed if the surgeon/ anesthesia feels like they are unable to be safely monitored.   Do not shave  48 hours prior to surgery.               Men may shave face and neck.   Do not bring valuables to the hospital. [place].   Contacts, dentures or bridgework may not be worn into surgery.   Bring small overnight bag day of surgery.   DO NOT [place]. PHARMACY WILL DISPENSE MEDICATIONS LISTED ON YOUR MEDICATION LIST TO YOU DURING YOUR ADMISSION [place]!    Patients discharged on the day of surgery will not be allowed to drive home.  Someone NEEDS to stay with you for the first 24 hours after anesthesia.   Special Instructions: Bring a copy of your healthcare power of attorney and living will documents the day of surgery if you haven't scanned them before.              Please read over the following fact sheets you were given:  IF [place] [phone]   If you received a COVID test during your pre-op visit  it is requested that you wear a mask when out in public, stay away from anyone that may not be feeling well and notify your surgeon if you develop symptoms. If you test positive for Covid or have been in contact with anyone that has tested positive in the last 10 days please notify you surgeon.    [place] [facility] - Preparing for Surgery Before surgery, you can play an important role.  Because skin is not sterile, your skin needs to be as free of germs as possible.  You can reduce the number of germs on your skin by washing with CHG (chlorahexidine gluconate) soap before surgery.  CHG is an antiseptic cleaner which kills germs and bonds with the skin to continue killing germs even after washing. Please DO NOT use if you have an allergy to CHG or antibacterial soaps.  If your skin becomes reddened/irritated stop using the CHG and inform your nurse when you arrive at Short Stay. Do not shave (including legs and underarms) for at least 48 hours prior to the first CHG shower.  You may shave your face/neck.  Please follow these instructions carefully:  1.  Shower with CHG Soap the night before surgery and the  morning of surgery.  2.  If you choose  to wash your hair, wash your hair first as usual with your normal  shampoo.  3.  After you shampoo, rinse your hair and body thoroughly to remove the shampoo.                             4.  Use CHG as you would any other liquid soap.  You can apply chg directly to the skin and wash.  Gently with a scrungie or clean washcloth.  5.  Apply the CHG Soap to your body ONLY FROM THE NECK DOWN.   Do   not use on face/ open                           Wound or open sores. Avoid contact with eyes, ears mouth and   genitals (private parts).                       Wash face,  Genitals (private parts) with your normal soap.             6.  Wash  thoroughly, paying special attention to the area where your    surgery  will be performed.  7.  Thoroughly rinse your body with warm water from the neck down.  8.  DO NOT shower/wash with your normal soap after using and rinsing off the CHG Soap.                9.  Pat yourself dry with a clean towel.            10.  Wear clean pajamas.            11.  Place clean sheets on your bed the night of your first shower and do not  sleep with pets. Day of Surgery : Do not apply any lotions/deodorants the morning of surgery.  Please wear clean clothes to the hospital/surgery center.  FAILURE TO FOLLOW THESE INSTRUCTIONS MAY RESULT IN THE CANCELLATION OF YOUR SURGERY  PATIENT SIGNATURE_________________________________  NURSE SIGNATURE__________________________________  ________________________________________________________________________  Bradley Novak  An incentive spirometer is a tool that can help keep your lungs clear and active. This tool measures how well you are filling your lungs with each breath. Taking long deep breaths may help reverse or decrease the chance of developing breathing (pulmonary) problems (especially infection) following: A long period of time when you are unable to move or be active. BEFORE THE PROCEDURE  If the spirometer includes an indicator to show your best effort, your nurse or respiratory therapist will set it to a desired goal. If possible, sit up straight or lean slightly forward. Try not to slouch. Hold the incentive spirometer in an upright position. INSTRUCTIONS FOR USE  Sit on the edge of your bed if possible, or sit up as far as you can in bed or on a chair. Hold the incentive spirometer in an upright position. Breathe out normally. Place the mouthpiece in your mouth and seal your lips tightly around it. Breathe in slowly and as deeply as possible, raising the piston or the ball toward the top of the column. Hold your breath for 3-5 seconds or  for as long as possible. Allow the piston or ball to fall to the bottom of the column. Remove the mouthpiece from your mouth and breathe out normally. Rest for a few seconds and repeat Steps 1 through 7  at least 10 times every 1-2 hours when you are awake. Take your time and take a few normal breaths between deep breaths. The spirometer may include an indicator to show your best effort. Use the indicator as a goal to work toward during each repetition. After each set of 10 deep breaths, practice coughing to be sure your lungs are clear. If you have an incision (the cut made at the time of surgery), support your incision when coughing by placing a pillow or rolled up towels firmly against it. Once you are able to get out of bed, walk around indoors and cough well. You may stop using the incentive spirometer when instructed by your caregiver.  RISKS AND COMPLICATIONS Take your time so you do not get dizzy or light-headed. If you are in pain, you may need to take or ask for pain medication before doing incentive spirometry. It is harder to take a deep breath if you are having pain. AFTER USE Rest and breathe slowly and easily. It can be helpful to keep track of a log of your progress. Your caregiver can provide you with a simple table to help with this. If you are using the spirometer at home, follow these instructions: [place] IF:  You are having difficultly using the spirometer. You have trouble using the spirometer as often as instructed. Your pain medication is not giving enough relief while using the spirometer. You develop fever of 100.5 F (38.1 C) or higher. SEEK IMMEDIATE MEDICAL CARE IF:  You cough up bloody sputum that had not been present before. You develop fever of 102 F (38.9 C) or greater. You develop worsening pain at or near the incision site. MAKE SURE YOU:  Understand these instructions. Will watch your condition. Will get help right away if you are not doing  well or get worse. Document Released: 05/14/2006 Document Revised: 03/26/2011 Document Reviewed: 07/15/2006 [facility] Patient Information 2014 [place], [place].   ________________________________________________________________________

## 2021-12-04 NOTE — Progress Notes (Signed)
COVID Vaccine Completed: yes  Date of COVID positive in last 90 days:  PCP - Chana Bode, PA Cardiologist - Cleatrice Burke, MD Electrophysiologist- Jeanene Erb, MD  Chest x-ray -  EKG - 06/05/21 Epic Stress Test -  ECHO - 05/09/20 Epic Cardiac Cath -  Pacemaker/ICD device last checked: Spinal Cord Stimulator:  Bowel Prep -   Sleep Study -  CPAP -   Fasting Blood Sugar -  Checks Blood Sugar _____ times a day  Last dose of GLP1 agonist-  N/A GLP1 instructions:  N/A   Last dose of SGLT-2 inhibitors-  N/A SGLT-2 instructions: N/A   Blood Thinner Instructions: Aspirin Instructions: Last Dose:  Activity level:  Can go up a flight of stairs and perform activities of daily living without stopping and without symptoms of chest pain or shortness of breath.  Able to exercise without symptoms  Unable to go up a flight of stairs without symptoms of     Anesthesia review: HTN. SVT, CAD, atherosclerosis of aorta, hodgkin lymphoma   Patient denies shortness of breath, fever, cough and chest pain at PAT appointment  Patient verbalized understanding of instructions that were given to them at the PAT appointment. Patient was also instructed that they will need to review over the PAT instructions again at home before surgery.

## 2021-12-05 ENCOUNTER — Encounter (HOSPITAL_COMMUNITY)
Admission: RE | Admit: 2021-12-05 | Discharge: 2021-12-05 | Disposition: A | Discharge status: Home / Self Care | Payer: Medicare Other | Source: Ambulatory Visit | Attending: Anesthesiology | Admitting: Anesthesiology

## 2021-12-05 DIAGNOSIS — Z01818 Encounter for other preprocedural examination: Secondary | ICD-10-CM

## 2021-12-05 DIAGNOSIS — I25119 Atherosclerotic heart disease of native coronary artery with unspecified angina pectoris: Secondary | ICD-10-CM

## 2021-12-06 ENCOUNTER — Telehealth: Payer: Self-pay | Admitting: Medical

## 2021-12-06 NOTE — Telephone Encounter (Signed)
Left message for patient to call back and schedule Medicare Annual Wellness Visit (AWV) either virtually or in office. Left  my Bradley Novak number [phone]    awvi 12/15/17 per [facility]  please schedule with Nurse Health Adviser   45 min for awv-i and in office appointments 30 min for awv-s  phone/virtual appointments

## 2022-01-05 ENCOUNTER — Other Ambulatory Visit: Payer: Self-pay | Admitting: Medical

## 2022-01-05 ENCOUNTER — Encounter: Payer: Self-pay | Admitting: Cardiology

## 2022-01-05 ENCOUNTER — Ambulatory Visit: Payer: Medicare Other | Attending: Cardiology | Admitting: Cardiology

## 2022-01-05 VITALS — BP 130/74 | HR 58 | Ht 70.0 in | Wt 247.8 lb

## 2022-01-05 DIAGNOSIS — I2510 Atherosclerotic heart disease of native coronary artery without angina pectoris: Secondary | ICD-10-CM

## 2022-01-05 DIAGNOSIS — I2583 Coronary atherosclerosis due to lipid rich plaque: Secondary | ICD-10-CM | POA: Diagnosis not present

## 2022-01-05 DIAGNOSIS — I471 Supraventricular tachycardia, unspecified: Secondary | ICD-10-CM | POA: Diagnosis not present

## 2022-01-05 NOTE — Patient Instructions (Signed)
Medication Instructions:  Your physician recommends that you continue on your current medications as directed. Please refer to the Current Medication list given to you today.  *If you need a refill on your cardiac medications before your next appointment, please call your pharmacy*   Lab Work: None ordered   Testing/Procedures: None ordered   Follow-Up: At [facility], you and your health needs are our priority.  As part of our continuing mission to provide you with exceptional heart care, we have created designated Provider Care Teams.  These Care Teams include your primary Cardiologist (physician) and Advanced Practice Providers (APPs -  Physician Assistants and Nurse Practitioners) who all work together to provide you with the care you need, when you need it.  Your next appointment:   1 year(s)  The format for your next appointment:   In Person  Provider:   Will Camnitz, MD    Thank you for choosing [facility]!!   Tedford Berg, RN [phone]  Other Instructions   Important Information About Sugar           

## 2022-01-05 NOTE — Progress Notes (Signed)
Electrophysiology Office Note   Date:  01/05/2022   ID:  Bradley, Novak 1950/07/25, MRN [id number]  PCP:  Carlena Hurl, PA-C  Cardiologist:  Nahser Primary Electrophysiologist:  Kiyoto Slomski Meredith Leeds, MD    No chief complaint on file.    History of Present Illness: Bradley Novak is a 71 y.o. male who is being seen today for the evaluation of SVT at the request of Nahser. Presenting today for electrophysiology evaluation.    He has a history significant for Hodgkin's lymphoma, hypertension, ORT.  He underwent catheter ablation 01/29/2018 with a successful ablation of the pathway at CS 5, 6.  Unfortunately he did have recurrences of SVT.  SVT occurred in the setting of lymphoma.  Today, denies symptoms of palpitations, chest pain, shortness of breath, orthopnea, PND, lower extremity edema, claudication, dizziness, presyncope, syncope, bleeding, or neurologic sequela. The patient is tolerating medications without difficulties.  He overall feels well.  He has intermittent palpitations, but they are all short-lived.  They terminate with vagal maneuvers.  Overall, he is quite happy with his control.  He is planning right knee replacement.  He recently had a left knee replacement.  He is not restricted in any activity.  He can climb stairs without issue.    Past Medical History:  Diagnosis Date   Contact lens/glasses fitting    Dysrhythmia    GERD (gastroesophageal reflux disease)    History of chemotherapy    1 yr chemo completed 03/2018- in remission per patient   History of hiatal hernia    Hodgkin's lymphoma ([place]) 09/2017   Hyperlipidemia    Hypertension 2006   Obesity    Pneumonia 09/2017   SVT (supraventricular tachycardia)    Wears glasses    Past Surgical History:  Procedure Laterality Date   CARDIAC CATHETERIZATION  1980s   COLONOSCOPY  05/20/2019   Dr.Gupta   IR IMAGING GUIDED PORT INSERTION  11/20/2017   IR REMOVAL TUN ACCESS W/ PORT W/O FL MOD SED   06/02/2018   PORT-A-CATH REMOVAL     SIGMOIDOSCOPY  08/21/2019   Dr.Gupta   SVT ABLATION  01/29/2018   SVT ABLATION N/A 01/29/2018   Procedure: SVT ABLATION;  Surgeon: Constance Haw, MD;  Location: [facility];  Service: Cardiovascular;  Laterality: N/A;   TOTAL KNEE ARTHROPLASTY Left 08/11/2021   Procedure: TOTAL KNEE ARTHROPLASTY;  Surgeon: Netta Cedars, MD;  [facility];  Service: Orthopedics;  Laterality: Left;   WISDOM TOOTH EXTRACTION       Current Outpatient Medications  Medication Sig Dispense Refill   atorvastatin (LIPITOR) 20 MG tablet TAKE 1 TABLET BY MOUTH DAILY (Patient taking differently: Take 20 mg by mouth daily.) 90 tablet 3   carvedilol (COREG) 6.25 MG tablet Take 1 tablet (6.25 mg total) by mouth 2 (two) times daily. 180 tablet 3   CRANBERRY PO Take 1 capsule by mouth daily.     diltiazem (CARDIZEM CD) 120 MG 24 hr capsule Take 1 capsule (120 mg total) by mouth daily as needed. 90 capsule 3   diltiazem (CARDIZEM) 30 MG tablet Take 1 tablet (30 mg total) by mouth 2 (two) times daily as needed (daily PRN for palpitations). 90 tablet 1   esomeprazole (NEXIUM) 20 MG capsule Take 20 mg by mouth 2 (two) times daily.     finasteride (PROSCAR) 5 MG tablet TAKE ONE TABLET BY MOUTH ONCE DAILY 90 tablet 0   losartan (COZAAR) 100 MG tablet TAKE ONE TABLET  BY MOUTH ONCE DAILY 90 tablet 0   methocarbamol (ROBAXIN) 750 MG tablet Take 750 mg by mouth 3 (three) times daily as needed for muscle spasms.     nitroGLYCERIN (NITROSTAT) 0.3 MG SL tablet Place 1 tablet (0.3 mg total) under the tongue every 5 (five) minutes as needed for chest pain. 30 tablet 0   tadalafil (CIALIS) 5 MG tablet Take 5 mg by mouth daily as needed for erectile dysfunction.     No current facility-administered medications for this visit.    Allergies:   Patient has no known allergies.   Social History:  The patient  reports that he quit smoking about 50 years ago. His smoking use included  cigarettes. He has a 10.00 pack-year smoking history. He has never used smokeless tobacco. He reports that he does not drink alcohol and does not use drugs.   Family History:  The patient's family history includes Hypertension in his brother, brother, brother, brother, and father.   ROS:  Please see the history of present illness.   Otherwise, review of systems is positive for none.   All other systems are reviewed and negative.   PHYSICAL EXAM: VS:  BP 130/74   Pulse (!) 58   Ht '5\' 10"'$  (1.778 m)   Wt 247 lb 12.8 oz (112.4 kg)   SpO2 98%   BMI 35.56 kg/m  , BMI Body mass index is 35.56 kg/m. GEN: Well nourished, well developed, in no acute distress  HEENT: normal  Neck: no JVD, carotid bruits, or masses Cardiac: RRR; no murmurs, rubs, or gallops,no edema  Respiratory:  clear to auscultation bilaterally, normal work of breathing GI: soft, nontender, nondistended, + BS MS: no deformity or atrophy  Skin: warm and dry Neuro:  Strength and sensation are intact Psych: euthymic mood, full affect  EKG:  EKG is ordered today. Personal review of the ekg ordered shows sinus rhythm, rate 58   Recent Labs: 10/24/2021: TSH 2.170 11/14/2021: ALT 15; BUN 17; Creatinine 1.34; Hemoglobin 13.1; Platelet Count 210; Potassium 3.9; Sodium 140    Lipid Panel     Component Value Date/Time   CHOL 127 12/22/2020 1223   TRIG 141 12/22/2020 1223   HDL 40 12/22/2020 1223   CHOLHDL 3.2 12/22/2020 1223   CHOLHDL 5.1 (H) 09/27/2014 0001   VLDL 37 (H) 09/27/2014 0001   LDLCALC 62 12/22/2020 1223     Wt Readings from Last 3 Encounters:  01/05/22 247 lb 12.8 oz (112.4 kg)  11/14/21 258 lb (117 kg)  10/24/21 261 lb (118.4 kg)      Other studies Reviewed: Additional studies/ records that were reviewed today include: TTE 11/15/17  Review of the above records today demonstrates:  - Left ventricle: The cavity size was normal. Systolic function was   normal. The estimated ejection fraction was in the  range of 55%   to 60%. Wall motion was normal; there were no regional wall   motion abnormalities. Left ventricular diastolic function   parameters were normal.   ASSESSMENT AND PLAN:  1.  ORT: Post ablation 01/29/2018.  He is continue to have recurrences and is on carvedilol.  Recurrences occurred during the setting of lymphoma.  He continues to have short episodes of SVT, though these are well-controlled with vagal maneuvers.  No changes.  2.  Hypertension: well controlled  3.  Preoperative evaluation: Has potential plans for right knee replacement.  He is able to do all of his daily activities without any restriction.  He  would be at intermediate risk for this intermediate risk procedure.  No further cardiac testing is necessary.  Current medicines are reviewed at length with the patient today.   The patient does not have concerns regarding his medicines.  The following changes were made today: none  Labs/ tests ordered today include:  Orders Placed This Encounter  Procedures   EKG 12-Lead     Disposition:   FU 12 months  Signed, Omari Koslosky Meredith Leeds, MD  01/05/2022 8:36 AM     [facility] [address] [place] [address] [phone] (office) [phone] (fax)

## 2022-01-09 ENCOUNTER — Other Ambulatory Visit: Payer: Self-pay

## 2022-01-24 ENCOUNTER — Telehealth: Payer: Self-pay | Admitting: Medical

## 2022-01-24 ENCOUNTER — Encounter: Payer: Self-pay | Admitting: Hematology

## 2022-01-24 NOTE — Telephone Encounter (Signed)
Spoke with patient to schedule Medicare Annual Wellness Visit (AWV) either virtually or in office. Left  my Herbie Drape number [phone]  He stated he could not schedule to call back med Ileana Ladd 12/15/17 per [facility] please schedule with Nurse Health Adviser   45 min for awv-i  in office appointments 30 min for awv-s & awv-i phone/virtual appointments

## 2022-02-20 NOTE — H&P (Signed)
Patient's anticipated LOS is less than 2 midnights, meeting these requirements: - Younger than 20 - Lives within 1 hour of care - Has a competent adult at home to recover with post-op recover - NO history of  - Chronic pain requiring opiods  - Diabetes  - Coronary Artery Disease  - Heart failure  - Heart attack  - Stroke  - DVT/VTE  - Cardiac arrhythmia  - Respiratory Failure/COPD  - Renal failure  - Anemia  - Advanced Liver disease     Bradley Novak is an 72 y.o. male.    Chief Complaint: right knee pain  HPI: Pt is a 72 y.o. male complaining of right knee pain for multiple years. Pain had continually increased since the beginning. X-rays in the clinic show end-stage arthritic changes of the right knee. Pt has tried various conservative treatments which have failed to alleviate their symptoms, including injections and therapy. Various options are discussed with the patient. Risks, benefits and expectations were discussed with the patient. Patient understand the risks, benefits and expectations and wishes to proceed with surgery.   PCP:  Carlena Hurl, PA-C  D/C Plans: Home  PMH: Past Medical History:  Diagnosis Date   Contact lens/glasses fitting    Dysrhythmia    GERD (gastroesophageal reflux disease)    History of chemotherapy    1 yr chemo completed 03/2018- in remission per patient   History of hiatal hernia    Hodgkin's lymphoma ([place]) 09/2017   Hyperlipidemia    Hypertension 2006   Obesity    Pneumonia 09/2017   SVT (supraventricular tachycardia)    Wears glasses     PSH: Past Surgical History:  Procedure Laterality Date   CARDIAC CATHETERIZATION  1980s   COLONOSCOPY  05/20/2019   Dr.Gupta   IR IMAGING GUIDED PORT INSERTION  11/20/2017   IR REMOVAL TUN ACCESS W/ PORT W/O FL MOD SED  06/02/2018   PORT-A-CATH REMOVAL     SIGMOIDOSCOPY  08/21/2019   Dr.Gupta   SVT ABLATION  01/29/2018   SVT ABLATION N/A 01/29/2018   Procedure: SVT ABLATION;   Surgeon: Constance Haw, MD;  Location: [facility];  Service: Cardiovascular;  Laterality: N/A;   TOTAL KNEE ARTHROPLASTY Left 08/11/2021   Procedure: TOTAL KNEE ARTHROPLASTY;  Surgeon: Netta Cedars, MD;  [facility];  Service: Orthopedics;  Laterality: Left;   WISDOM TOOTH EXTRACTION      Social History:  reports that he quit smoking about 50 years ago. His smoking use included cigarettes. He has a 10.00 pack-year smoking history. He has never used smokeless tobacco. He reports that he does not drink alcohol and does not use drugs. BMI: Estimated body mass index is 35.56 kg/m as calculated from the following:   Height as of 01/05/22: '5\' 10"'$  (1.778 m).   Weight as of 01/05/22: 112.4 kg.  Lab Results  Component Value Date   ALBUMIN 3.9 11/14/2021   Diabetes: Patient does not have a diagnosis of diabetes. Lab Results  Component Value Date   HGBA1C 5.4 05/24/2021     Smoking Status:      Allergies:  No Known Allergies  Medications: No current facility-administered medications for this encounter.   Current Outpatient Medications  Medication Sig Dispense Refill   atorvastatin (LIPITOR) 20 MG tablet TAKE ONE TABLET BY MOUTH ONCE DAILY 90 tablet 0   carvedilol (COREG) 6.25 MG tablet TAKE ONE TABLET BY MOUTH TWICE DAILY 180 tablet 0   CRANBERRY PO Take 1  capsule by mouth daily.     diltiazem (CARDIZEM CD) 120 MG 24 hr capsule Take 1 capsule (120 mg total) by mouth daily as needed. 90 capsule 3   diltiazem (CARDIZEM) 30 MG tablet Take 1 tablet (30 mg total) by mouth 2 (two) times daily as needed (daily PRN for palpitations). 90 tablet 1   esomeprazole (NEXIUM) 20 MG capsule Take 20 mg by mouth 2 (two) times daily.     finasteride (PROSCAR) 5 MG tablet TAKE ONE TABLET BY MOUTH ONCE DAILY 90 tablet 0   losartan (COZAAR) 100 MG tablet TAKE ONE TABLET BY MOUTH ONCE DAILY 90 tablet 0   methocarbamol (ROBAXIN) 750 MG tablet Take 750 mg by mouth 3 (three) times daily as  needed for muscle spasms.     nitroGLYCERIN (NITROSTAT) 0.3 MG SL tablet Place 1 tablet (0.3 mg total) under the tongue every 5 (five) minutes as needed for chest pain. 30 tablet 0   tadalafil (CIALIS) 5 MG tablet Take 5 mg by mouth daily as needed for erectile dysfunction.      No results found for this or any previous visit (from the past 48 hour(s)). No results found.  ROS: Pain with rom of the right lower extremity  Physical Exam: Alert and oriented 72 y.o. male in no acute distress Cranial nerves 2-12 intact Cervical spine: full rom with no tenderness, nv intact distally Chest: active breath sounds bilaterally, no wheeze rhonchi or rales Heart: regular rate and rhythm, no murmur Abd: non tender non distended with active bowel sounds Hip is stable with rom  Right knee painful rom with crepitus Nv intact distally No rashes  Antalgic gait  Assessment/Plan Assessment: right knee end stage osteoarthritis  Plan:  Patient will undergo a right total knee by Dr. Veverly Fells at [facility] benefits and expectations were discussed with the patient. Patient understand risks, benefits and expectations and wishes to proceed. Preoperative templating of the joint replacement has been completed, documented, and submitted to the Operating Room personnel in order to optimize intra-operative equipment management.   Merla Riches PA-C, MPAS [facility] is now [employer] [address], [place], [place], [place] [address] Phone: [phone] [email or web address] Facebook  [employer]

## 2022-03-02 ENCOUNTER — Encounter (HOSPITAL_COMMUNITY): Admission: RE | Admit: 2022-03-02 | Payer: Medicare Other | Source: Ambulatory Visit

## 2022-03-05 NOTE — Progress Notes (Signed)
Anesthesia Review:  PCP: Cardiologist : Chest x-ray : EKG :01/05/22  Echo : 05/09/20  Monitor- 04/25/20  2020- SVT Ablation  Stress test: Cardiac Cath :  Activity level:  Sleep Study/ CPAP : Fasting Blood Sugar :      / Checks Blood Sugar -- times a day:   Blood Thinner/ Instructions /Last Dose: ASA / Instructions/ Last Dose :

## 2022-03-05 NOTE — Patient Instructions (Signed)
SURGICAL WAITING ROOM VISITATION  Patients having surgery or a procedure may have no more than 2 support people in the waiting area - these visitors may rotate.    Children under the age of 65 must have an adult with them who is not the patient.  Due to an increase in RSV and influenza rates and associated hospitalizations, children ages 23 and under may not visit patients in [place].  If the patient needs to stay at the hospital during part of their recovery, the visitor guidelines for inpatient rooms apply. Pre-op nurse will coordinate an appropriate time for 1 support person to accompany patient in pre-op.  This support person may not rotate.    Please refer to the [facility] website for the visitor guidelines for Inpatients (after your surgery is over and you are in a regular room).       Your procedure is scheduled on:  03/09/22    Report to [facility] Main Entrance    Report to admitting at    [phone]   Call this number if you have problems the morning of surgery [phone]   Do not eat food :After Midnight.   After Midnight you may have the following liquids until _ 0430_____ Am  DAY OF SURGERY  Water Non-Citrus Juices (without pulp, NO RED-Apple, White grape, White cranberry) Black Coffee (NO MILK/CREAM OR CREAMERS, sugar ok)  Clear Tea (NO MILK/CREAM OR CREAMERS, sugar ok) regular and decaf                             Plain Jell-O (NO RED)                                           Fruit ices (not with fruit pulp, NO RED)                                     Popsicles (NO RED)                                                               Sports drinks like Gatorade (NO RED)                     The day of surgery:  Drink ONE (1) Pre-Surgery Clear Ensure or G2 at   0430 AM 9 have completed by )  the morning of surgery. Drink in one sitting. Do not sip.  This drink was given to you during your hospital  pre-op appointment visit. Nothing else to  drink after completing the  Pre-Surgery Clear Ensure or G2.          If you have questions, please contact your surgeon's office.     Oral Hygiene is also important to reduce your risk of infection.                                    Remember - BRUSH YOUR TEETH THE MORNING OF  SURGERY WITH YOUR REGULAR TOOTHPASTE  DENTURES WILL BE REMOVED PRIOR TO SURGERY PLEASE DO NOT APPLY "Poly grip" OR ADHESIVES!!!   Do NOT smoke after Midnight   Take these medicines the morning of surgery with A SIP OF WATER:  coreg, nexium, proscar, cardizem if needed   DO NOT TAKE ANY ORAL DIABETIC MEDICATIONS DAY OF YOUR SURGERY  Bring CPAP mask and tubing day of surgery.                              You may not have any metal on your body including hair pins, jewelry, and body piercing             Do not wear make-up, lotions, powders, perfumes/cologne, or deodorant  Do not wear nail polish including gel and S&S, artificial/acrylic nails, or any other type of covering on natural nails including finger and toenails. If you have artificial nails, gel coating, etc. that needs to be removed by a nail salon please have this removed prior to surgery or surgery may need to be canceled/ delayed if the surgeon/ anesthesia feels like they are unable to be safely monitored.   Do not shave  48 hours prior to surgery.               Men may shave face and neck.   Do not bring valuables to the hospital. [place].   Contacts, glasses, dentures or bridgework may not be worn into surgery.   Bring small overnight bag day of surgery.   DO NOT [place]. PHARMACY WILL DISPENSE MEDICATIONS LISTED ON YOUR MEDICATION LIST TO YOU DURING YOUR ADMISSION [place]!    Patients discharged on the day of surgery will not be allowed to drive home.  Someone NEEDS to stay with you for the first 24 hours after anesthesia.   Special Instructions:  Bring a copy of your healthcare power of attorney and living will documents the day of surgery if you haven't scanned them before.              Please read over the following fact sheets you were given: IF [place] [phone]   If you received a COVID test during your pre-op visit  it is requested that you wear a mask when out in public, stay away from anyone that may not be feeling well and notify your surgeon if you develop symptoms. If you test positive for Covid or have been in contact with anyone that has tested positive in the last 10 days please notify you surgeon.    Bradley Novak - Preparing for Surgery Before surgery, you can play an important role.  Because skin is not sterile, your skin needs to be as free of germs as possible.  You can reduce the number of germs on your skin by washing with CHG (chlorahexidine gluconate) soap before surgery.  CHG is an antiseptic cleaner which kills germs and bonds with the skin to continue killing germs even after washing. Please DO NOT use if you have an allergy to CHG or antibacterial soaps.  If your skin becomes reddened/irritated stop using the CHG and inform your nurse when you arrive at Short Stay. Do not shave (including legs and underarms) for at least 48  hours prior to the first CHG shower.  You may shave your face/neck. Please follow these instructions carefully:  1.  Shower with CHG Soap the night before surgery and the  morning of Surgery.  2.  If you choose to wash your hair, wash your hair first as usual with your  normal  shampoo.  3.  After you shampoo, rinse your hair and body thoroughly to remove the  shampoo.                           4.  Use CHG as you would any other liquid soap.  You can apply chg directly  to the skin and wash                       Gently with a scrungie or clean washcloth.  5.  Apply the CHG Soap to your body ONLY FROM THE NECK DOWN.   Do not use on face/ open                            Wound or open sores. Avoid contact with eyes, ears mouth and genitals (private parts).                       Wash face,  Genitals (private parts) with your normal soap.             6.  Wash thoroughly, paying special attention to the area where your surgery  will be performed.  7.  Thoroughly rinse your body with warm water from the neck down.  8.  DO NOT shower/wash with your normal soap after using and rinsing off  the CHG Soap.                9.  Pat yourself dry with a clean towel.            10.  Wear clean pajamas.            11.  Place clean sheets on your bed the night of your first shower and do not  sleep with pets. Day of Surgery : Do not apply any lotions/deodorants the morning of surgery.  Please wear clean clothes to the hospital/surgery center.  FAILURE TO FOLLOW THESE INSTRUCTIONS MAY RESULT IN THE CANCELLATION OF YOUR SURGERY PATIENT SIGNATURE_________________________________  NURSE SIGNATURE__________________________________  ________________________________________________________________________

## 2022-03-06 ENCOUNTER — Encounter (HOSPITAL_COMMUNITY): Payer: Self-pay

## 2022-03-06 ENCOUNTER — Other Ambulatory Visit: Payer: Self-pay

## 2022-03-06 ENCOUNTER — Encounter (HOSPITAL_COMMUNITY)
Admission: RE | Admit: 2022-03-06 | Discharge: 2022-03-06 | Disposition: A | Discharge status: Home / Self Care | Payer: Medicare Other | Source: Ambulatory Visit | Attending: Orthopedic Surgery | Admitting: Orthopedic Surgery

## 2022-03-06 ENCOUNTER — Encounter: Payer: Self-pay | Admitting: Hematology

## 2022-03-06 DIAGNOSIS — I10 Essential (primary) hypertension: Secondary | ICD-10-CM | POA: Insufficient documentation

## 2022-03-06 DIAGNOSIS — Z87891 Personal history of nicotine dependence: Secondary | ICD-10-CM | POA: Insufficient documentation

## 2022-03-06 DIAGNOSIS — M1711 Unilateral primary osteoarthritis, right knee: Secondary | ICD-10-CM | POA: Insufficient documentation

## 2022-03-06 DIAGNOSIS — Z01818 Encounter for other preprocedural examination: Secondary | ICD-10-CM

## 2022-03-06 DIAGNOSIS — Z01812 Encounter for preprocedural laboratory examination: Secondary | ICD-10-CM | POA: Diagnosis not present

## 2022-03-06 DIAGNOSIS — I25119 Atherosclerotic heart disease of native coronary artery with unspecified angina pectoris: Secondary | ICD-10-CM | POA: Diagnosis not present

## 2022-03-06 DIAGNOSIS — Z8571 Personal history of Hodgkin lymphoma: Secondary | ICD-10-CM | POA: Insufficient documentation

## 2022-03-06 HISTORY — DX: Unspecified osteoarthritis, unspecified site: M19.90

## 2022-03-06 LAB — BASIC METABOLIC PANEL
Anion gap: 8 (ref 5–15)
BUN: 25 mg/dL — ABNORMAL HIGH (ref 8–23)
CO2: 25 mmol/L (ref 22–32)
Calcium: 8.9 mg/dL (ref 8.9–10.3)
Chloride: 106 mmol/L (ref 98–111)
Creatinine, Ser: 0.89 mg/dL (ref 0.61–1.24)
GFR, Estimated: >60 mL/min (ref >60)
Glucose, Bld: 116 mg/dL — ABNORMAL HIGH (ref 70–99)
Potassium: 4.3 mmol/L (ref 3.5–5.1)
Sodium: 139 mmol/L (ref 135–145)

## 2022-03-06 LAB — CBC
HCT: 43.3 % (ref 39.0–52.0)
Hemoglobin: 13.8 g/dL (ref 13.0–17.0)
MCH: 29.4 pg (ref 26.0–34.0)
MCHC: 31.9 g/dL (ref 30.0–36.0)
MCV: 92.3 fL (ref 80.0–100.0)
Platelets: 217 10*3/uL (ref 150–400)
RBC: 4.69 MIL/uL (ref 4.22–5.81)
RDW: 12.8 % (ref 11.5–15.5)
WBC: 7.3 10*3/uL (ref 4.0–10.5)
nRBC: 0 % (ref 0.0–0.2)

## 2022-03-06 LAB — SURGICAL PCR SCREEN
MRSA, PCR: NEGATIVE
Staphylococcus aureus: NEGATIVE

## 2022-03-07 NOTE — Progress Notes (Signed)
Anesthesia Chart Review   Case: [id number] Date/Time: 03/09/22 0715   Procedure: TOTAL KNEE ARTHROPLASTY (Right: Knee)   Anesthesia type: Spinal   Pre-op diagnosis: Right knee endstage osteoarthritis   [facility] 06 / [facility]   Surgeons: Netta Cedars, MD       DISCUSSION:71 y.o. former smoker with h/o HTN, SVT s/p ablation 2020 with recurrences on carvedilol, hodgkin's lymphoma in remission, right knee OA scheduled for above procedure 03/09/2022 with Dr. Netta Cedars.   Pt last seen by cardiology 01/05/22. Per OV note, "Preoperative evaluation: Has potential plans for right knee replacement.  He is able to do all of his daily activities without any restriction.  He would be at intermediate risk for this intermediate risk procedure.  No further cardiac testing is necessary."  Anticipate pt can proceed with planned procedure barring acute status change.   VS: BP (!) 183/91   Pulse 65   Temp 36.7 C (Oral)   Resp 16   Ht 5' 10"$  (1.778 m)   Wt 110 kg   SpO2 98%   BMI 34.80 kg/m   PROVIDERS: Carlena Hurl, PA-C   LABS: Labs reviewed: Acceptable for surgery. (all labs ordered are listed, but only abnormal results are displayed)  Labs Reviewed  BASIC METABOLIC PANEL - Abnormal; Notable for the following components:      Result Value   Glucose, Bld 116 (*)    BUN 25 (*)    All other components within normal limits  SURGICAL PCR SCREEN  CBC     IMAGES:   EKG:   CV: Echo 05/09/2020 1. Left ventricular ejection fraction, by estimation, is 55 to 60%. The  left ventricle has normal function. The left ventricle has no regional  wall motion abnormalities. There is mild left ventricular hypertrophy.  Left ventricular diastolic parameters  are indeterminate.   2. Right ventricular systolic function is normal. The right ventricular  size is normal. Tricuspid regurgitation signal is inadequate for assessing  PA pressure.   3. The mitral valve is normal in structure.  No evidence of mitral valve  regurgitation. No evidence of mitral stenosis.   4. The aortic valve is tricuspid. Aortic valve regurgitation is not  visualized. Mild aortic valve sclerosis is present, with no evidence of  aortic valve stenosis.   5. Aortic dilatation noted. There is mild dilatation of the ascending  aorta, measuring 40 mm.  Past Medical History:  Diagnosis Date   Arthritis    Contact lens/glasses fitting    Dysrhythmia    hx of SVT   GERD (gastroesophageal reflux disease)    History of chemotherapy    1 yr chemo completed 03/2018- in remission per patient   History of hiatal hernia    Hodgkin's lymphoma ([place]) 09/2017   Hyperlipidemia    Hypertension 2006   Obesity    SVT (supraventricular tachycardia)    Wears glasses     Past Surgical History:  Procedure Laterality Date   CARDIAC CATHETERIZATION  1980s   COLONOSCOPY  05/20/2019   Dr.Gupta   IR IMAGING GUIDED PORT INSERTION  11/20/2017   IR REMOVAL TUN ACCESS W/ PORT W/O FL MOD SED  06/02/2018   PORT-A-CATH REMOVAL     SIGMOIDOSCOPY  08/21/2019   Dr.Gupta   SVT ABLATION  01/29/2018   SVT ABLATION N/A 01/29/2018   Procedure: SVT ABLATION;  Surgeon: Constance Haw, MD;  Location: [facility];  Service: Cardiovascular;  Laterality: N/A;   TOTAL KNEE  ARTHROPLASTY Left 08/11/2021   Procedure: TOTAL KNEE ARTHROPLASTY;  Surgeon: Netta Cedars, MD;  [facility];  Service: Orthopedics;  Laterality: Left;   WISDOM TOOTH EXTRACTION      MEDICATIONS:  atorvastatin (LIPITOR) 20 MG tablet   carvedilol (COREG) 6.25 MG tablet   CRANBERRY PO   diltiazem (CARDIZEM CD) 120 MG 24 hr capsule   diltiazem (CARDIZEM) 30 MG tablet   esomeprazole (NEXIUM) 20 MG capsule   finasteride (PROSCAR) 5 MG tablet   losartan (COZAAR) 100 MG tablet   methocarbamol (ROBAXIN) 750 MG tablet   nitroGLYCERIN (NITROSTAT) 0.3 MG SL tablet   tadalafil (CIALIS) 5 MG tablet   No current facility-administered medications for  this encounter.    Bradley Felix Ward, PA-C [facility]Surgical Testing [phone]

## 2022-03-09 ENCOUNTER — Encounter (HOSPITAL_COMMUNITY)
Admission: RE | Disposition: A | Discharge status: Home / Self Care | Payer: Self-pay | Source: Home / Self Care | Attending: Orthopedic Surgery

## 2022-03-09 ENCOUNTER — Ambulatory Visit (HOSPITAL_COMMUNITY): Payer: Medicare Other | Admitting: Physician Assistant

## 2022-03-09 ENCOUNTER — Ambulatory Visit (HOSPITAL_BASED_OUTPATIENT_CLINIC_OR_DEPARTMENT_OTHER): Payer: Medicare Other | Admitting: Anesthesiology

## 2022-03-09 ENCOUNTER — Observation Stay (HOSPITAL_COMMUNITY)
Admission: RE | Admit: 2022-03-09 | Discharge: 2022-03-10 | Disposition: A | Discharge status: Home / Self Care | Payer: Medicare Other | Attending: Orthopedic Surgery | Admitting: Orthopedic Surgery

## 2022-03-09 ENCOUNTER — Encounter (HOSPITAL_COMMUNITY): Payer: Self-pay | Admitting: Orthopedic Surgery

## 2022-03-09 ENCOUNTER — Other Ambulatory Visit: Payer: Self-pay

## 2022-03-09 DIAGNOSIS — M1711 Unilateral primary osteoarthritis, right knee: Secondary | ICD-10-CM

## 2022-03-09 DIAGNOSIS — Z87891 Personal history of nicotine dependence: Secondary | ICD-10-CM | POA: Insufficient documentation

## 2022-03-09 DIAGNOSIS — Z96652 Presence of left artificial knee joint: Secondary | ICD-10-CM | POA: Insufficient documentation

## 2022-03-09 DIAGNOSIS — I10 Essential (primary) hypertension: Secondary | ICD-10-CM

## 2022-03-09 DIAGNOSIS — Z79899 Other long term (current) drug therapy: Secondary | ICD-10-CM | POA: Diagnosis not present

## 2022-03-09 DIAGNOSIS — Z8571 Personal history of Hodgkin lymphoma: Secondary | ICD-10-CM | POA: Diagnosis not present

## 2022-03-09 DIAGNOSIS — I2510 Atherosclerotic heart disease of native coronary artery without angina pectoris: Secondary | ICD-10-CM

## 2022-03-09 DIAGNOSIS — G8918 Other acute postprocedural pain: Secondary | ICD-10-CM | POA: Diagnosis not present

## 2022-03-09 DIAGNOSIS — Z96651 Presence of right artificial knee joint: Secondary | ICD-10-CM

## 2022-03-09 HISTORY — PX: TOTAL KNEE ARTHROPLASTY: SHX125

## 2022-03-09 SURGERY — ARTHROPLASTY, KNEE, TOTAL
Anesthesia: Spinal | Site: Knee | Laterality: Right

## 2022-03-09 MED ORDER — FENTANYL CITRATE (PF) 100 MCG/2ML IJ SOLN
INTRAMUSCULAR | Status: DC | PRN
  Administered 2022-03-09 (×2): 50 ug via INTRAVENOUS

## 2022-03-09 MED ORDER — FENTANYL CITRATE (PF) 100 MCG/2ML IJ SOLN
INTRAMUSCULAR | Status: AC
  Filled 2022-03-09: qty 2

## 2022-03-09 MED ORDER — METOCLOPRAMIDE HCL 5 MG/ML IJ SOLN: 5.0000 mg | Freq: Three times a day (TID) | INTRAMUSCULAR | Status: DC | PRN

## 2022-03-09 MED ORDER — NITROGLYCERIN 0.4 MG SL SUBL: 0.4000 mg | SUBLINGUAL_TABLET | SUBLINGUAL | Status: DC | PRN

## 2022-03-09 MED ORDER — DILTIAZEM HCL 30 MG PO TABS: 30.0000 mg | ORAL_TABLET | Freq: Two times a day (BID) | ORAL | Status: DC | PRN

## 2022-03-09 MED ORDER — DILTIAZEM HCL ER COATED BEADS 120 MG PO CP24: 120.0000 mg | ORAL_CAPSULE | Freq: Every day | ORAL | Status: DC | PRN

## 2022-03-09 MED ORDER — CEFAZOLIN SODIUM-DEXTROSE 2-4 GM/100ML-% IV SOLN
2.0000 g | INTRAVENOUS | Status: DC
  Filled 2022-03-09: qty 100

## 2022-03-09 MED ORDER — BUPIVACAINE LIPOSOME 1.3 % IJ SUSP
INTRAMUSCULAR | Status: AC
  Filled 2022-03-09: qty 20

## 2022-03-09 MED ORDER — ORAL CARE MOUTH RINSE: 15.0000 mL | Freq: Once | OROMUCOSAL | Status: AC

## 2022-03-09 MED ORDER — TADALAFIL 5 MG PO TABS: 5.0000 mg | ORAL_TABLET | Freq: Every day | ORAL | Status: DC | PRN

## 2022-03-09 MED ORDER — PROPOFOL 10 MG/ML IV BOLUS
INTRAVENOUS | Status: DC | PRN
  Administered 2022-03-09 (×2): 30 mg via INTRAVENOUS

## 2022-03-09 MED ORDER — CARVEDILOL 6.25 MG PO TABS
6.2500 mg | ORAL_TABLET | Freq: Two times a day (BID) | ORAL | Status: DC
  Administered 2022-03-09 – 2022-03-10 (×2): 6.25 mg via ORAL
  Filled 2022-03-09 (×2): qty 1

## 2022-03-09 MED ORDER — BUPIVACAINE IN DEXTROSE 0.75-8.25 % IT SOLN
INTRATHECAL | Status: DC | PRN
  Administered 2022-03-09: 1.8 mL via INTRATHECAL

## 2022-03-09 MED ORDER — PHENYLEPHRINE HCL-NACL 20-0.9 MG/250ML-% IV SOLN
INTRAVENOUS | Status: AC
  Filled 2022-03-09: qty 250

## 2022-03-09 MED ORDER — METHOCARBAMOL 500 MG PO TABS
750.0000 mg | ORAL_TABLET | Freq: Three times a day (TID) | ORAL | Status: DC | PRN
  Administered 2022-03-09 (×2): 750 mg via ORAL
  Filled 2022-03-09 (×2): qty 2

## 2022-03-09 MED ORDER — DOCUSATE SODIUM 100 MG PO CAPS
100.0000 mg | ORAL_CAPSULE | Freq: Two times a day (BID) | ORAL | Status: DC
  Administered 2022-03-09 – 2022-03-10 (×2): 100 mg via ORAL
  Filled 2022-03-09 (×2): qty 1

## 2022-03-09 MED ORDER — BUPIVACAINE-EPINEPHRINE (PF) 0.25% -1:200000 IJ SOLN
INTRAMUSCULAR | Status: AC
  Filled 2022-03-09: qty 30

## 2022-03-09 MED ORDER — DEXAMETHASONE SODIUM PHOSPHATE 10 MG/ML IJ SOLN
INTRAMUSCULAR | Status: DC | PRN
  Administered 2022-03-09: 10 mg via INTRAVENOUS

## 2022-03-09 MED ORDER — CEFAZOLIN SODIUM-DEXTROSE 2-4 GM/100ML-% IV SOLN
2.0000 g | Freq: Four times a day (QID) | INTRAVENOUS | Status: AC
  Administered 2022-03-09 (×2): 2 g via INTRAVENOUS
  Filled 2022-03-09 (×2): qty 100

## 2022-03-09 MED ORDER — OXYCODONE HCL 5 MG PO TABS
5.0000 mg | ORAL_TABLET | ORAL | Status: DC | PRN
  Administered 2022-03-09 – 2022-03-10 (×6): 10 mg via ORAL
  Filled 2022-03-09 (×6): qty 2

## 2022-03-09 MED ORDER — ASPIRIN 81 MG PO CHEW: 81.0000 mg | CHEWABLE_TABLET | Freq: Two times a day (BID) | ORAL | 0 refills | Status: AC

## 2022-03-09 MED ORDER — CEFAZOLIN SODIUM-DEXTROSE 2-4 GM/100ML-% IV SOLN
2.0000 g | INTRAVENOUS | Status: AC
  Administered 2022-03-09: 2 g via INTRAVENOUS

## 2022-03-09 MED ORDER — LACTATED RINGERS IV SOLN: INTRAVENOUS | Status: DC

## 2022-03-09 MED ORDER — METHOCARBAMOL 750 MG PO TABS: 750.0000 mg | ORAL_TABLET | Freq: Three times a day (TID) | ORAL | 1 refills | Status: DC | PRN

## 2022-03-09 MED ORDER — MIDAZOLAM HCL 2 MG/2ML IJ SOLN
INTRAMUSCULAR | Status: AC
  Filled 2022-03-09: qty 2

## 2022-03-09 MED ORDER — EPHEDRINE SULFATE (PRESSORS) 50 MG/ML IJ SOLN
INTRAMUSCULAR | Status: DC | PRN
  Administered 2022-03-09: 7.5 mg via INTRAVENOUS
  Administered 2022-03-09: 5 mg via INTRAVENOUS

## 2022-03-09 MED ORDER — ONDANSETRON HCL 4 MG/2ML IJ SOLN
4.0000 mg | Freq: Four times a day (QID) | INTRAMUSCULAR | Status: DC | PRN
  Administered 2022-03-10: 4 mg via INTRAVENOUS
  Filled 2022-03-09: qty 2

## 2022-03-09 MED ORDER — FINASTERIDE 5 MG PO TABS
5.0000 mg | ORAL_TABLET | Freq: Every day | ORAL | Status: DC
  Administered 2022-03-10: 5 mg via ORAL
  Filled 2022-03-09: qty 1

## 2022-03-09 MED ORDER — POVIDONE-IODINE 10 % EX SWAB: 2.0000 | Freq: Once | CUTANEOUS | Status: DC

## 2022-03-09 MED ORDER — ACETAMINOPHEN 325 MG PO TABS: 325.0000 mg | ORAL_TABLET | Freq: Four times a day (QID) | ORAL | Status: DC | PRN

## 2022-03-09 MED ORDER — PHENOL 1.4 % MT LIQD: 1.0000 | OROMUCOSAL | Status: DC | PRN

## 2022-03-09 MED ORDER — PANTOPRAZOLE SODIUM 40 MG PO TBEC
40.0000 mg | DELAYED_RELEASE_TABLET | Freq: Every day | ORAL | Status: DC
  Administered 2022-03-10: 40 mg via ORAL
  Filled 2022-03-09: qty 1

## 2022-03-09 MED ORDER — BISACODYL 10 MG RE SUPP: 10.0000 mg | Freq: Every day | RECTAL | Status: DC | PRN

## 2022-03-09 MED ORDER — PROPOFOL 500 MG/50ML IV EMUL
INTRAVENOUS | Status: AC
  Filled 2022-03-09: qty 50

## 2022-03-09 MED ORDER — TRANEXAMIC ACID-NACL 1000-0.7 MG/100ML-% IV SOLN: 1000.0000 mg | INTRAVENOUS | Status: DC

## 2022-03-09 MED ORDER — OXYCODONE HCL 5 MG/5ML PO SOLN: 5.0000 mg | Freq: Once | ORAL | Status: DC | PRN

## 2022-03-09 MED ORDER — MENTHOL 3 MG MT LOZG: 1.0000 | LOZENGE | OROMUCOSAL | Status: DC | PRN

## 2022-03-09 MED ORDER — SODIUM CHLORIDE 0.9 % IR SOLN
Status: DC | PRN
  Administered 2022-03-09: 1000 mL

## 2022-03-09 MED ORDER — ATORVASTATIN CALCIUM 20 MG PO TABS
20.0000 mg | ORAL_TABLET | Freq: Every day | ORAL | Status: DC
  Administered 2022-03-09 – 2022-03-10 (×2): 20 mg via ORAL
  Filled 2022-03-09 (×2): qty 1

## 2022-03-09 MED ORDER — ONDANSETRON HCL 4 MG PO TABS: 4.0000 mg | ORAL_TABLET | Freq: Four times a day (QID) | ORAL | Status: DC | PRN

## 2022-03-09 MED ORDER — BUPIVACAINE-EPINEPHRINE 0.25% -1:200000 IJ SOLN
INTRAMUSCULAR | Status: DC | PRN
  Administered 2022-03-09: 30 mL

## 2022-03-09 MED ORDER — BUPIVACAINE LIPOSOME 1.3 % IJ SUSP: 20.0000 mL | Freq: Once | INTRAMUSCULAR | Status: DC

## 2022-03-09 MED ORDER — PROPOFOL 500 MG/50ML IV EMUL
INTRAVENOUS | Status: DC | PRN
  Administered 2022-03-09: 90 ug/kg/min via INTRAVENOUS

## 2022-03-09 MED ORDER — SODIUM CHLORIDE (PF) 0.9 % IJ SOLN
INTRAMUSCULAR | Status: AC
  Filled 2022-03-09: qty 30

## 2022-03-09 MED ORDER — LOSARTAN POTASSIUM 50 MG PO TABS
100.0000 mg | ORAL_TABLET | Freq: Every day | ORAL | Status: DC
  Administered 2022-03-10: 100 mg via ORAL
  Filled 2022-03-09: qty 2

## 2022-03-09 MED ORDER — PHENYLEPHRINE HCL-NACL 20-0.9 MG/250ML-% IV SOLN
INTRAVENOUS | Status: DC | PRN
  Administered 2022-03-09: 30 ug/min via INTRAVENOUS

## 2022-03-09 MED ORDER — DEXAMETHASONE SODIUM PHOSPHATE 10 MG/ML IJ SOLN
INTRAMUSCULAR | Status: AC
  Filled 2022-03-09: qty 1

## 2022-03-09 MED ORDER — LIDOCAINE HCL (PF) 2 % IJ SOLN
INTRAMUSCULAR | Status: AC
  Filled 2022-03-09: qty 5

## 2022-03-09 MED ORDER — METOCLOPRAMIDE HCL 5 MG PO TABS: 5.0000 mg | ORAL_TABLET | Freq: Three times a day (TID) | ORAL | Status: DC | PRN

## 2022-03-09 MED ORDER — WATER FOR IRRIGATION, STERILE IR SOLN
Status: DC | PRN
  Administered 2022-03-09: 2000 mL

## 2022-03-09 MED ORDER — EPHEDRINE 5 MG/ML INJ
INTRAVENOUS | Status: AC
  Filled 2022-03-09: qty 5

## 2022-03-09 MED ORDER — PROPOFOL 1000 MG/100ML IV EMUL
INTRAVENOUS | Status: AC
  Filled 2022-03-09: qty 100

## 2022-03-09 MED ORDER — HYDROMORPHONE HCL 1 MG/ML IJ SOLN
0.5000 mg | INTRAMUSCULAR | Status: DC | PRN
  Administered 2022-03-09 – 2022-03-10 (×3): 1 mg via INTRAVENOUS
  Filled 2022-03-09 (×3): qty 1

## 2022-03-09 MED ORDER — ROPIVACAINE HCL 7.5 MG/ML IJ SOLN
INTRAMUSCULAR | Status: DC | PRN
  Administered 2022-03-09: 20 mL via PERINEURAL

## 2022-03-09 MED ORDER — POLYETHYLENE GLYCOL 3350 17 G PO PACK: 17.0000 g | PACK | Freq: Every day | ORAL | Status: DC | PRN

## 2022-03-09 MED ORDER — CHLORHEXIDINE GLUCONATE 0.12 % MT SOLN
15.0000 mL | Freq: Once | OROMUCOSAL | Status: AC
  Administered 2022-03-09: 15 mL via OROMUCOSAL

## 2022-03-09 MED ORDER — MIDAZOLAM HCL 2 MG/2ML IJ SOLN
INTRAMUSCULAR | Status: DC | PRN
  Administered 2022-03-09: 2 mg via INTRAVENOUS

## 2022-03-09 MED ORDER — TRANEXAMIC ACID-NACL 1000-0.7 MG/100ML-% IV SOLN
1000.0000 mg | INTRAVENOUS | Status: AC
  Administered 2022-03-09: 1000 mg via INTRAVENOUS
  Filled 2022-03-09: qty 100

## 2022-03-09 MED ORDER — OXYCODONE-ACETAMINOPHEN 5-325 MG PO TABS: 1.0000 | ORAL_TABLET | ORAL | 0 refills | Status: DC | PRN

## 2022-03-09 MED ORDER — OXYCODONE HCL 5 MG PO TABS: 5.0000 mg | ORAL_TABLET | Freq: Once | ORAL | Status: DC | PRN

## 2022-03-09 MED ORDER — BUPIVACAINE LIPOSOME 1.3 % IJ SUSP
INTRAMUSCULAR | Status: DC | PRN
  Administered 2022-03-09: 20 mL

## 2022-03-09 MED ORDER — HYDROMORPHONE HCL 1 MG/ML IJ SOLN: 0.2500 mg | INTRAMUSCULAR | Status: DC | PRN

## 2022-03-09 MED ORDER — SODIUM CHLORIDE 0.9 % IV SOLN: INTRAVENOUS | Status: DC

## 2022-03-09 MED ORDER — TRANEXAMIC ACID-NACL 1000-0.7 MG/100ML-% IV SOLN
1000.0000 mg | Freq: Once | INTRAVENOUS | Status: AC
  Administered 2022-03-09: 1000 mg via INTRAVENOUS
  Filled 2022-03-09: qty 100

## 2022-03-09 MED ORDER — 0.9 % SODIUM CHLORIDE (POUR BTL) OPTIME
TOPICAL | Status: DC | PRN
  Administered 2022-03-09: 1000 mL

## 2022-03-09 MED ORDER — ASPIRIN 81 MG PO CHEW
81.0000 mg | CHEWABLE_TABLET | Freq: Two times a day (BID) | ORAL | Status: DC
  Administered 2022-03-09 – 2022-03-10 (×2): 81 mg via ORAL
  Filled 2022-03-09 (×2): qty 1

## 2022-03-09 MED ORDER — SODIUM CHLORIDE (PF) 0.9 % IJ SOLN
INTRAMUSCULAR | Status: DC | PRN
  Administered 2022-03-09: 30 mL

## 2022-03-09 MED ORDER — ONDANSETRON HCL 4 MG/2ML IJ SOLN
INTRAMUSCULAR | Status: AC
  Filled 2022-03-09: qty 2

## 2022-03-09 MED ORDER — CRANBERRY 250 MG PO TABS: ORAL_TABLET | Freq: Every day | ORAL | Status: DC

## 2022-03-09 MED ORDER — ONDANSETRON HCL 4 MG/2ML IJ SOLN
INTRAMUSCULAR | Status: DC | PRN
  Administered 2022-03-09: 4 mg via INTRAVENOUS

## 2022-03-09 MED ORDER — ONDANSETRON HCL 4 MG/2ML IJ SOLN: 4.0000 mg | Freq: Once | INTRAMUSCULAR | Status: DC | PRN

## 2022-03-09 SURGICAL SUPPLY — 46 items
ATTUNE MED DOME PAT 41 KNEE (Knees) IMPLANT
ATTUNE PS FEM RT SZ 8 CEM KNEE (Femur) IMPLANT
ATTUNE PSRP INSR SZ8 12 KNEE (Insert) IMPLANT
BASE TIBIAL ROT PLAT SZ 8 KNEE (Knees) IMPLANT
BLADE SAG 18X100X1.27 (BLADE) ×1 IMPLANT
BLADE SAW SGTL 13X75X1.27 (BLADE) ×1 IMPLANT
BNDG ELASTIC 6X10 VLCR STRL LF (GAUZE/BANDAGES/DRESSINGS) ×1 IMPLANT
BNDG GAUZE DERMACEA FLUFF 4 (GAUZE/BANDAGES/DRESSINGS) ×1 IMPLANT
BOWL SMART MIX CTS (DISPOSABLE) ×1 IMPLANT
CEMENT HV SMART SET (Cement) ×2 IMPLANT
COVER SURGICAL LIGHT HANDLE (MISCELLANEOUS) ×1 IMPLANT
CUFF TOURN SGL QUICK 34 (TOURNIQUET CUFF) ×1
CUFF TRNQT CYL 34X4.125X (TOURNIQUET CUFF) ×1 IMPLANT
DRAPE INCISE IOBAN 66X45 STRL (DRAPES) ×1 IMPLANT
DRAPE SHEET LG 3/4 BI-LAMINATE (DRAPES) ×1 IMPLANT
DRAPE U-SHAPE 47X51 STRL (DRAPES) ×1 IMPLANT
DRSG ADAPTIC 3X8 NADH LF (GAUZE/BANDAGES/DRESSINGS) ×1 IMPLANT
DURAPREP 26ML APPLICATOR (WOUND CARE) ×1 IMPLANT
ELECT REM PT RETURN 15FT ADLT (MISCELLANEOUS) ×1 IMPLANT
GAUZE PAD ABD 8X10 STRL (GAUZE/BANDAGES/DRESSINGS) ×1 IMPLANT
GAUZE SPONGE 4X4 12PLY STRL (GAUZE/BANDAGES/DRESSINGS) ×1 IMPLANT
GLOVE BIOGEL PI IND STRL 7.5 (GLOVE) ×1 IMPLANT
GLOVE BIOGEL PI IND STRL 8.5 (GLOVE) ×1 IMPLANT
GLOVE ORTHO TXT STRL SZ7.5 (GLOVE) ×1 IMPLANT
GLOVE SURG ORTHO 8.5 STRL (GLOVE) ×2 IMPLANT
GOWN STRL REUS W/ TWL XL LVL3 (GOWN DISPOSABLE) ×2 IMPLANT
GOWN STRL REUS W/TWL XL LVL3 (GOWN DISPOSABLE) ×2
HANDPIECE INTERPULSE COAX TIP (DISPOSABLE) ×1
IMMOBILIZER KNEE 20 (SOFTGOODS) ×1
IMMOBILIZER KNEE 20 THIGH 36 (SOFTGOODS) IMPLANT
KIT TURNOVER KIT A (KITS) IMPLANT
MANIFOLD NEPTUNE II (INSTRUMENTS) ×1 IMPLANT
NS IRRIG 1000ML POUR BTL (IV SOLUTION) ×1 IMPLANT
PACK TOTAL KNEE CUSTOM (KITS) ×1 IMPLANT
PIN STEINMAN FIXATION KNEE (PIN) IMPLANT
PROTECTOR NERVE ULNAR (MISCELLANEOUS) ×1 IMPLANT
SET HNDPC FAN SPRY TIP SCT (DISPOSABLE) ×1 IMPLANT
STAPLER VISISTAT 35W (STAPLE) IMPLANT
STRIP CLOSURE SKIN 1/2X4 (GAUZE/BANDAGES/DRESSINGS) ×2 IMPLANT
SUT MNCRL AB 3-0 PS2 18 (SUTURE) ×1 IMPLANT
SUT VIC AB 0 CT1 36 (SUTURE) ×1 IMPLANT
SUT VIC AB 1 CT1 36 (SUTURE) ×3 IMPLANT
SUT VIC AB 2-0 CT1 27 (SUTURE) ×1
SUT VIC AB 2-0 CT1 TAPERPNT 27 (SUTURE) ×1 IMPLANT
TIBIAL BASE ROT PLAT SZ 8 KNEE (Knees) ×1 IMPLANT
WATER STERILE IRR 1000ML POUR (IV SOLUTION) ×2 IMPLANT

## 2022-03-09 NOTE — Brief Op Note (Signed)
03/09/2022  9:28 AM  PATIENT:  Bradley Novak  72 y.o. male  PRE-OPERATIVE DIAGNOSIS:  Right knee endstage osteoarthritis  POST-OPERATIVE DIAGNOSIS:  Right knee endstage osteoarthritis  PROCEDURE:  Procedure(s): TOTAL KNEE ARTHROPLASTY (Right) DePuy Attune  SURGEON:  Surgeon(s) and Role:    Netta Cedars, MD - Primary  PHYSICIAN ASSISTANT:   ASSISTANTS: Ventura Bruns, PA-C   ANESTHESIA:   regional and spinal  EBL:  40 mL   BLOOD ADMINISTERED:none  DRAINS: none   LOCAL MEDICATIONS USED:  MARCAINE     SPECIMEN:  No Specimen  DISPOSITION OF SPECIMEN:  N/A  COUNTS:  YES  TOURNIQUET:   Total Tourniquet Time Documented: Thigh (Right) - 93 minutes Total: Thigh (Right) - 93 minutes   DICTATION: .Other Dictation: Dictation Number [id number]  PLAN OF CARE: Admit for overnight observation  PATIENT DISPOSITION:  PACU - hemodynamically stable.   Delay start of Pharmacological VTE agent (>24hrs) due to surgical blood loss or risk of bleeding: no

## 2022-03-09 NOTE — Progress Notes (Signed)
Orthopedic Tech Progress Note Patient Details:  Bradley Novak [birth year] [id number]  CPM Right Knee CPM Right Knee: On Right Knee Flexion (Degrees): 90 Right Knee Extension (Degrees): 0  Post Interventions Patient Tolerated: Well Ortho Devices Type of Ortho Device: Bone foam zero knee Ortho Device/Splint Location: Right knee Ortho Device/Splint Interventions: Application   Post Interventions Patient Tolerated: Well  Linus Salmons Charolette Bultman 03/09/2022, 10:19 AM

## 2022-03-09 NOTE — Op Note (Signed)
NAME: Bradley Novak, Bradley Novak MEDICAL RECORD NO: [id number] ACCOUNT NO: [id number] DATE OF BIRTH: 1951/01/15 FACILITY: [facility] LOCATION: [facility] PHYSICIAN: Doran Heater. Veverly Fells, MD  Operative Report   DATE OF PROCEDURE: 03/09/2022  PREOPERATIVE DIAGNOSIS:  Right knee end-stage arthritis.  POSTOPERATIVE DIAGNOSIS:  Right knee end-stage arthritis.  PROCEDURE PERFORMED:  Right total knee arthroplasty using DePuy Attune prosthesis.  ATTENDING SURGEON:  Doran Heater. Veverly Fells, MD  ASSISTANT:  Charletta Cousin [place], [place], who was scrubbed during the entire procedure.  ANESTHESIA:  Spinal anesthesia was used plus adductor canal block.  ESTIMATED BLOOD LOSS:  Minimal.  FLUID REPLACEMENT:  1500 mL crystalloid.  COUNTS:  Instrument counts correct.  COMPLICATIONS:  No complications.  ANTIBIOTICS:  Perioperative antibiotics were given.  TOURNIQUET TIME:  93 minutes at 300 mmHg.  INDICATIONS:  The patient is a 72 year old male who presents with worsening right knee pain secondary to end-stage arthritis, bone-on-bone.  The patient has instability and pain limiting activity and quality of life.  The patient presents for total knee  arthroplasty today.  Informed consent obtained.  DESCRIPTION OF PROCEDURE:  After an adequate level of anesthesia was achieved, the patient was positioned supine on the operating table.  Nonsterile tourniquet placed on right proximal thigh.  Right leg sterilely prepped and draped in the usual manner.   Timeout called, verifying correct patient, correct site.  We elevated the leg and exsanguinated with an Esmarch bandage, inflating the tourniquet to 300 mmHg.  We then went ahead and placed the knee in flexion and timeout was called. We elevated the leg  and exsanguinated the leg with an Esmarch bandage.  We then inflated the tourniquet to 300 mmHg, placed the knee in flexion and performed a longitudinal midline incision.  We then used a fresh 10 blade for the medial parapatellar  arthrotomy.  We everted  the patella, dividing lateral patellofemoral ligaments.  We then noted the distal femur to be devoid of cartilage.  We entered the distal femur with a step cut drill.  We then placed our intramedullary guide and resected 10 mm off the affected right  side.  The distal femur set on 5 degrees of valgus.  We used a 10 mm resection due to a small flexion contracture.  Next, we sized our femur to a size 8 anterior down performing anterior, posterior and chamfer cuts with the 4 in 1 block.  We then removed  ACL and PCL meniscal tissue subluxing the tibia anteriorly and then performing our tibial cut with an external jig 90 degrees perpendicular to the long axis of the tibia with minimal posterior slope for this posterior cruciate substituting prosthesis.   Next, we resected 2 mm off the medial side with that resection.  Once we had our tibial resection done, we used a lamina spreader and removed posterior femoral condyle osteophytes.  We next checked our gaps, which were symmetric at 8 mm.  We then  injected the posterior capsule with Marcaine, Exparel and saline.  We then completed our tibial preparation doing our modular drill and keel punch for the size 8 tibia, externally rotating the component as much as possible for patellar tracking.  We then  did our box cut for the 8 right femur.  We placed a 8 mm and then a 10 mm trial.  We were able to get the knee into full extension.  We had good flexion stability.  Next, we resurfaced the patella going from a 26 mm thickness down to a 17 mm thickness  with the oscillating saw on the patellar cutting guide, we drilled our lug holes for the 41 patellar button.  We then trialled with the patellar button and had excellent patellar tracking with no-touch technique.  We removed the trial components.  We  irrigated thoroughly.  We then vacuum mixed high viscosity cement on the back table.  We dried the bone well and then cemented the components into  place.  The 8 tibia, the 8 right femur with a 10 mm poly spacer.  We placed the knee in extension to  compress the implants until the cement was set up.  We also used a patellar clamp on the 41 patellar button.  Once the cement was hardened, we removed excess cement with quarter-inch curved osteotome.  We inspected the entire knee joint.  We trialed with  a size 12 and we were happy with that, we went with a real 12 poly, placed on the tibial tray, reduced the knee.  We had excellent stability both in flexion and extension and normal patellar tracking.  We irrigated thoroughly.  We injected the anterior  capsule with a combination of Marcaine, Exparel and saline and then closed the parapatellar arthrotomy with #1 Vicryl suture, interrupted followed by 0 in 2 layered subcutaneous closure and 4-0 Monocryl for skin.  Steri-Strips applied followed by sterile  dressing.  The patient tolerated surgery well.   PUS D: 03/09/2022 9:33:50 am T: 03/09/2022 10:03:00 am  JOB: [id number]

## 2022-03-09 NOTE — Evaluation (Signed)
Physical Therapy Evaluation Patient Details Name: Bradley Novak MRN: [id number] DOB: [birth year] Today's Date: 03/09/2022  History of Present Illness  72 yo male presents to therapy s/p R TKA on 03/09/2022 due to failure of conservative measures. Pt PMH includes but is not limited to hodgkin's lymphoma s/p chemotherapy, HTN, HDL, SVT s/p ablation, and L TKA (07/2021).  Clinical Impression  Bradley Novak is a 72 y.o. male POD 0 s/p R TKA. Patient reports IND with mobility at baseline. Patient is now limited by functional impairments (see PT problem list below) and requires increased time, cues and min guard for bed mobility and for transfers. Patient was able to ambulate 3 feet with RW and min guard level of assist.Gait assessment limited secondary to pt pain report of 10/10 with R LE WB and 9/10 pain at rest. Pain mediations administered prior to PT evaluation and nursing staff is aware of pt pain response. During PT eval ortho tech removed CPM and indicated pt had been on CPM for 4 hrs today.  Patient instructed in exercise to facilitate ROM and circulation to manage edema. Patient will benefit from continued skilled PT interventions to address impairments and progress towards PLOF. Acute PT will follow to progress mobility and stair training in preparation for safe discharge home.    Elevated BP 163/93 at rest in R UE repeat assessment 161/85 in L UE at rest   Recommendations for follow up therapy are one component of a multi-disciplinary discharge planning process, led by the attending physician.  Recommendations may be updated based on patient status, additional functional criteria and insurance authorization.  Follow Up Recommendations Outpatient PT      Assistance Recommended at Discharge Frequent or constant Supervision/Assistance  Patient can return home with the following  A little help with walking and/or transfers;A little help with bathing/dressing/bathroom;Assistance with  cooking/housework;Assist for transportation;Help with stairs or ramp for entrance    Equipment Recommendations Other (comment) (pt reports having DME in home setting)  Recommendations for Other Services       Functional Status Assessment Patient has had a recent decline in their functional status and demonstrates the ability to make significant improvements in function in a reasonable and predictable amount of time.     Precautions / Restrictions Precautions Precautions: Knee;Fall Restrictions Weight Bearing Restrictions: No      Mobility  Bed Mobility Overal bed mobility: Needs Assistance Bed Mobility: Supine to Sit     Supine to sit: Min guard          Transfers Overall transfer level: Needs assistance Equipment used: Rolling walker (2 wheels) Transfers: Bed to chair/wheelchair/BSC, Sit to/from Stand Sit to Stand: Min guard   Step pivot transfers: Min guard       General transfer comment: pt indicated pain 10/10 with WB for pivoting to recliner    Ambulation/Gait Ambulation/Gait assistance: Min guard Gait Distance (Feet): 3 Feet Assistive device: Rolling walker (2 wheels) Gait Pattern/deviations: Step-to pattern, Antalgic Gait velocity: decreased     General Gait Details: pt limited by pain today  Stairs            Wheelchair Mobility    Modified Rankin (Stroke Patients Only)       Balance Overall balance assessment: Needs assistance Sitting-balance support: Feet supported, Single extremity supported Sitting balance-Leahy Scale: Poor     Standing balance support: Reliant on assistive device for balance Standing balance-Leahy Scale: Poor  Pertinent Vitals/Pain Pain Assessment Pain Assessment: 0-10 Pain Score: 9 and increased to 10 with R LE WB  Pain Location: R knee Pain Descriptors / Indicators: Discomfort, Guarding, Constant, Tender Pain Intervention(s): Limited activity within patient's  tolerance, Monitored during session, Premedicated before session, Repositioned, Ice applied    Home Living Family/patient expects to be discharged to:: Private residence Living Arrangements: Spouse/significant other Available Help at Discharge: Family Type of Home: House Home Access: Stairs to enter Entrance Stairs-Rails: None Entrance Stairs-Number of Steps: 1+1 (1 step from walkway to porch and then thershold into home)   Home Layout: One level Home Equipment: Conservation officer, nature (2 wheels);Cane - single point;Shower seat - built in;Wheelchair - manual      Prior Function Prior Level of Function : Independent/Modified Independent             Mobility Comments: pt reports I with ADLs, self care tasks, driving no AD       Hand Dominance        Extremity/Trunk Assessment        Lower Extremity Assessment Lower Extremity Assessment: RLE deficits/detail       Communication   Communication: No difficulties  Cognition Arousal/Alertness: Awake/alert Behavior During Therapy: WFL for tasks assessed/performed Overall Cognitive Status: Within Functional Limits for tasks assessed                                          General Comments      Exercises Total Joint Exercises Ankle Circles/Pumps: AROM, Both, 20 reps   Assessment/Plan    PT Assessment Patient needs continued PT services  PT Problem List Decreased strength;Decreased range of motion;Decreased activity tolerance;Decreased balance;Decreased mobility;Decreased coordination;Pain       PT Treatment Interventions DME instruction;Gait training;Stair training;Therapeutic activities;Functional mobility training;Therapeutic exercise;Balance training;Neuromuscular re-education;Patient/family education;Modalities    PT Goals (Current goals can be found in the Care Plan section)  Acute Rehab PT Goals Patient Stated Goal: to get over this pain "hump" PT Goal Formulation: With patient Time For Goal  Achievement: 03/23/22 Potential to Achieve Goals: Good    Frequency 7X/week     Co-evaluation               AM-PAC PT "6 Clicks" Mobility  Outcome Measure Help needed turning from your back to your side while in a flat bed without using bedrails?: A Little Help needed moving from lying on your back to sitting on the side of a flat bed without using bedrails?: A Little Help needed moving to and from a bed to a chair (including a wheelchair)?: A Little Help needed standing up from a chair using your arms (e.g., wheelchair or bedside chair)?: A Little Help needed to walk in hospital room?: A Lot Help needed climbing 3-5 steps with a railing? : Total 6 Click Score: 15    End of Session Equipment Utilized During Treatment: Gait belt Activity Tolerance: Patient limited by pain Patient left: in chair;with call bell/phone within reach;with chair alarm set Nurse Communication: Mobility status;Other (comment) (pain report) PT Visit Diagnosis: Unsteadiness on feet (R26.81);Other abnormalities of gait and mobility (R26.89);Muscle weakness (generalized) (M62.81);Ataxic gait (R26.0);Pain Pain - Right/Left: Right Pain - part of body: Knee    Time: 1356-1443 PT Time Calculation (min) (ACUTE ONLY): 47 min   Charges:   PT Evaluation $PT Eval Low Complexity: 1 Low PT Treatments $Therapeutic Exercise: 8-22 mins $Therapeutic Activity: 8-22  mins       Baird Lyons, PT  Adair Patter 03/09/2022, 2:50 PM

## 2022-03-09 NOTE — Anesthesia Procedure Notes (Signed)
Spinal  Patient location during procedure: OR Start time: 03/09/2022 7:32 AM End time: 03/09/2022 7:36 AM Reason for block: surgical anesthesia Staffing Performed: anesthesiologist  Anesthesiologist: Myrtie Soman, MD Performed by: Myrtie Soman, MD Authorized by: Myrtie Soman, MD   Preanesthetic Checklist Completed: patient identified, IV checked, site marked, risks and benefits discussed, surgical consent, monitors and equipment checked, pre-op evaluation and timeout performed Spinal Block Patient position: sitting Prep: Betadine Patient monitoring: heart rate, continuous pulse ox and blood pressure Approach: midline Location: L3-4 Injection technique: single-shot Needle Needle type: Sprotte  Needle gauge: 24 G Needle length: 9 cm Assessment Sensory level: T6 Events: CSF return Additional Notes

## 2022-03-09 NOTE — TOC Transition Note (Signed)
Transition of Care [facility]) - CM/SW Discharge Note  Patient Details  Name: Bradley Novak MRN: [id number] Date of Birth: 1950-12-05  Transition of Care [facility]) CM/SW Contact:  Sherie Don, LCSW Phone Number: 03/09/2022, 1:28 PM  Clinical Narrative: Patient is expected to discharge home after working with PT. CSW met with patient to confirm discharge plan. Patient will go home with OPPT at [facility] with the first appointment scheduled for 03/12/22. Patient has a rolling walker at home, so there are no DME needs at this time. TOC signing off.   Final next level of care: OP Rehab Barriers to Discharge: No Barriers Identified  Patient Goals and CMS Choice Choice offered to / list presented to : NA  Discharge Plan and Services Additional resources added to the After Visit Summary for          DME Arranged: N/A DME Agency: NA  Social Determinants of Health (SDOH) Interventions SDOH Screenings   Food Insecurity: No Food Insecurity (10/31/2021)  Housing: Low Risk  (10/31/2021)  Transportation Needs: No Transportation Needs (10/31/2021)  Depression (PHQ2-9): Low Risk  (11/17/2020)  Tobacco Use: Medium Risk (03/09/2022)   Readmission Risk Interventions     No data to display

## 2022-03-09 NOTE — Anesthesia Procedure Notes (Signed)
Anesthesia Procedure Image    

## 2022-03-09 NOTE — Care Plan (Signed)
Ortho Bundle Case Management Note  Patient Details  Name: Bradley Novak MRN: [id number] Date of Birth: 1950/10/17                  R TKA on 03/09/22.  DCP: Home with wife.  DME: No needs. Has RW.  PT: EO 2/26   DME Arranged:  N/A DME Agency:       Additional Comments: Please contact me with any questions of if this plan should need to change.   Dario Ave, Case Manager  [facility]  [phone] 03/09/2022, 1:18 PM

## 2022-03-09 NOTE — Anesthesia Postprocedure Evaluation (Signed)
Anesthesia Post Note  Patient: Bradley Novak  Procedure(s) Performed: TOTAL KNEE ARTHROPLASTY (Right: Knee)     Patient location during evaluation: PACU Anesthesia Type: Spinal Level of consciousness: oriented and awake and alert Pain management: pain level controlled Vital Signs Assessment: post-procedure vital signs reviewed and stable Respiratory status: spontaneous breathing, respiratory function stable and patient connected to nasal cannula oxygen Cardiovascular status: blood pressure returned to baseline and stable Postop Assessment: no headache, no backache and no apparent nausea or vomiting Anesthetic complications: no  No notable events documented.  Last Vitals:  Vitals:   03/09/22 1015 03/09/22 1030  BP: 139/75 138/80  Pulse: (!) 58 (!) 59  Resp: 18 12  Temp:    SpO2: 99% 93%    Last Pain:  Vitals:   03/09/22 1015  TempSrc:   PainSc: 0-No pain                 Phong Isenberg S

## 2022-03-09 NOTE — Progress Notes (Signed)
Orthopedic Tech Progress Note Patient Details:  Bradley Novak [birth year] [id number] CPM taken off  CPM Right Knee CPM Right Knee: Other (Comment) Right Knee Flexion (Degrees):  (orho tech removed during eval) Right Knee Extension (Degrees): 0  Post Interventions Patient Tolerated: Well  Bradley Novak 03/09/2022, 2:15 PM

## 2022-03-09 NOTE — Anesthesia Procedure Notes (Signed)
Anesthesia Regional Block: Adductor canal block   Pre-Anesthetic Checklist: , timeout performed,  Correct Patient, Correct Site, Correct Laterality,  Correct Procedure, Correct Position, site marked,  Risks and benefits discussed,  Surgical consent,  Pre-op evaluation,  At surgeon's request and post-op pain management  Laterality: Right  Prep: chloraprep       Needles:  Injection technique: Single-shot  Needle Type: Echogenic Needle     Needle Length: 9cm      Additional Needles:   Procedures:,,,, ultrasound used (permanent image in chart),,    Narrative:  Start time: 03/09/2022 6:51 AM End time: 03/09/2022 6:59 AM Injection made incrementally with aspirations every 5 mL.  Performed by: Personally  Anesthesiologist: Myrtie Soman, MD  Additional Notes: Patient tolerated the procedure well without complications

## 2022-03-09 NOTE — Interval H&P Note (Signed)
History and Physical Interval Note:  03/09/2022 7:06 AM  Bradley Novak  has presented today for surgery, with the diagnosis of Right knee endstage osteoarthritis.  The various methods of treatment have been discussed with the patient and family. After consideration of risks, benefits and other options for treatment, the patient has consented to  Procedure(s): TOTAL KNEE ARTHROPLASTY (Right) as a surgical intervention.  The patient's history has been reviewed, patient examined, no change in status, stable for surgery.  I have reviewed the patient's chart and labs.  Questions were answered to the patient's satisfaction.     Augustin Schooling

## 2022-03-09 NOTE — Discharge Instructions (Signed)
Ice to the knee constantly.  Keep the incision covered and clean and dry for one week, then ok to get it wet in the shower.  Do exercise as instructed every hour, please to prevent stiffness.    DO NOT prop anything under the knee, it will make your knee stiff.  Prop under the ankle to encourage your knee to go straight.   Use the walker while you are up and around for balance.  Wear your support stockings 24/7 to prevent blood clots and take baby aspirin twice daily for 30 days also to prevent blood clots  Follow up with Dr Veverly Fells in two weeks in the office, call [phone] for appt  Call Dr Veverly Fells at [phone] cell if you have any questions or concerns  INSTRUCTIONS AFTER JOINT REPLACEMENT   Remove items at home which could result in a fall. This includes throw rugs or furniture in walking pathways ICE to the affected joint every three hours while awake for 30 minutes at a time, for at least the first 3-5 days, and then as needed for pain and swelling.  Continue to use ice for pain and swelling. You may notice swelling that will progress down to the foot and ankle.  This is normal after surgery.  Elevate your leg when you are not up walking on it.   Continue to use the breathing machine you got in the hospital (incentive spirometer) which will help keep your temperature down.  It is common for your temperature to cycle up and down following surgery, especially at night when you are not up moving around and exerting yourself.  The breathing machine keeps your lungs expanded and your temperature down.   DIET:  As you were doing prior to hospitalization, we recommend a well-balanced diet.  DRESSING / WOUND CARE / SHOWERING  You may change your dressing 3-5 days after surgery.  Then change the dressing every day with sterile gauze.  Please use good hand washing techniques before changing the dressing.  Do not use any lotions or creams on the incision until instructed by your  surgeon.  ACTIVITY  Increase activity slowly as tolerated, but follow the weight bearing instructions below.   No driving for 6 weeks or until further direction given by your physician.  You cannot drive while taking narcotics.  No lifting or carrying greater than 10 lbs. until further directed by your surgeon. Avoid periods of inactivity such as sitting longer than an hour when not asleep. This helps prevent blood clots.  You may return to work once you are authorized by your doctor.     WEIGHT BEARING   Weight bearing as tolerated with assist device (walker, cane, etc) as directed, use it as long as suggested by your surgeon or therapist, typically at least 4-6 weeks.   EXERCISES  Results after joint replacement surgery are often greatly improved when you follow the exercise, range of motion and muscle strengthening exercises prescribed by your doctor. Safety measures are also important to protect the joint from further injury. Any time any of these exercises cause you to have increased pain or swelling, decrease what you are doing until you are comfortable again and then slowly increase them. If you have problems or questions, call your caregiver or physical therapist for advice.   Rehabilitation is important following a joint replacement. After just a few days of immobilization, the muscles of the leg can become weakened and shrink (atrophy).  These exercises are designed to  build up the tone and strength of the thigh and leg muscles and to improve motion. Often times heat used for twenty to thirty minutes before working out will loosen up your tissues and help with improving the range of motion but do not use heat for the first two weeks following surgery (sometimes heat can increase post-operative swelling).   These exercises can be done on a training (exercise) mat, on the floor, on a table or on a bed. Use whatever works the best and is most comfortable for you.    Use music or  television while you are exercising so that the exercises are a pleasant break in your day. This will make your life better with the exercises acting as a break in your routine that you can look forward to.   Perform all exercises about fifteen times, three times per day or as directed.  You should exercise both the operative leg and the other leg as well.  Exercises include:   Quad Sets - Tighten up the muscle on the front of the thigh (Quad) and hold for 5-10 seconds.   Straight Leg Raises - With your knee straight (if you were given a brace, keep it on), lift the leg to 60 degrees, hold for 3 seconds, and slowly lower the leg.  Perform this exercise against resistance later as your leg gets stronger.  Leg Slides: Lying on your back, slowly slide your foot toward your buttocks, bending your knee up off the floor (only go as far as is comfortable). Then slowly slide your foot back down until your leg is flat on the floor again.  Angel Wings: Lying on your back spread your legs to the side as far apart as you can without causing discomfort.  Hamstring Strength:  Lying on your back, push your heel against the floor with your leg straight by tightening up the muscles of your buttocks.  Repeat, but this time bend your knee to a comfortable angle, and push your heel against the floor.  You may put a pillow under the heel to make it more comfortable if necessary.   A rehabilitation program following joint replacement surgery can speed recovery and prevent re-injury in the future due to weakened muscles. Contact your doctor or a physical therapist for more information on knee rehabilitation.    CONSTIPATION  Constipation is defined medically as fewer than three stools per week and severe constipation as less than one stool per week.  Even if you have a regular bowel pattern at home, your normal regimen is likely to be disrupted due to multiple reasons following surgery.  Combination of anesthesia,  postoperative narcotics, change in appetite and fluid intake all can affect your bowels.   YOU MUST use at least one of the following options; they are listed in order of increasing strength to get the job done.  They are all available over the counter, and you may need to use some, POSSIBLY even all of these options:    Drink plenty of fluids (prune juice may be helpful) and high fiber foods Colace 100 mg by mouth twice a day  Senokot for constipation as directed and as needed Dulcolax (bisacodyl), take with full glass of water  Miralax (polyethylene glycol) once or twice a day as needed.  If you have tried all these things and are unable to have a bowel movement in the first 3-4 days after surgery call either your surgeon or your primary doctor.    If  you experience loose stools or diarrhea, hold the medications until you stool forms back up.  If your symptoms do not get better within 1 week or if they get worse, check with your doctor.  If you experience "the worst abdominal pain ever" or develop nausea or vomiting, please contact the office immediately for further recommendations for treatment.   ITCHING:  If you experience itching with your medications, try taking only a single pain pill, or even half a pain pill at a time.  You can also use Benadryl over the counter for itching or also to help with sleep.   TED HOSE STOCKINGS:  Use stockings on both legs until for at least 2 weeks or as directed by physician office. They may be removed at night for sleeping.  MEDICATIONS:  See your medication summary on the "After Visit Summary" that nursing will review with you.  You may have some home medications which will be placed on hold until you complete the course of blood thinner medication.  It is important for you to complete the blood thinner medication as prescribed.  PRECAUTIONS:  If you experience chest pain or shortness of breath - call 911 immediately for transfer to the hospital emergency  department.   If you develop a fever greater that 101 F, purulent drainage from wound, increased redness or drainage from wound, foul odor from the wound/dressing, or calf pain - CONTACT YOUR SURGEON.                                                   FOLLOW-UP APPOINTMENTS:  If you do not already have a post-op appointment, please call the office for an appointment to be seen by your surgeon.  Guidelines for how soon to be seen are listed in your "After Visit Summary", but are typically between 1-4 weeks after surgery.  OTHER INSTRUCTIONS:   Knee Replacement:  Do not place pillow under knee, focus on keeping the knee straight while resting. CPM instructions: 0-90 degrees, 2 hours in the morning, 2 hours in the afternoon, and 2 hours in the evening. Place foam block, curve side up under heel at all times except when in CPM or when walking.  DO NOT modify, tear, cut, or change the foam block in any way.  POST-OPERATIVE OPIOID TAPER INSTRUCTIONS: It is important to wean off of your opioid medication as soon as possible. If you do not need pain medication after your surgery it is ok to stop day one. Opioids include: Codeine, Hydrocodone(Norco, Vicodin), Oxycodone(Percocet, oxycontin) and hydromorphone amongst others.  Long term and even short term use of opiods can cause: Increased pain response Dependence Constipation Depression Respiratory depression And more.  Withdrawal symptoms can include Flu like symptoms Nausea, vomiting And more Techniques to manage these symptoms Hydrate well Eat regular healthy meals Stay active Use relaxation techniques(deep breathing, meditating, yoga) Do Not substitute Alcohol to help with tapering If you have been on opioids for less than two weeks and do not have pain than it is ok to stop all together.  Plan to wean off of opioids This plan should start within one week post op of your joint replacement. Maintain the same interval or time between taking  each dose and first decrease the dose.  Cut the total daily intake of opioids by one tablet each day Next start  to increase the time between doses. The last dose that should be eliminated is the evening dose.   MAKE SURE YOU:  Understand these instructions.  Get help right away if you are not doing well or get worse.    Thank you for letting us be a part of your medical care team.  It is a privilege we respect greatly.  We hope these instructions will help you stay on track for a fast and full recovery!

## 2022-03-09 NOTE — Plan of Care (Signed)
  Problem: Education: Goal: Knowledge of the prescribed therapeutic regimen will improve Outcome: Progressing Goal: Individualized Educational Video(s) Outcome: Progressing   Problem: Activity: Goal: Ability to avoid complications of mobility impairment will improve Outcome: Progressing Goal: Range of joint motion will improve Outcome: Progressing   Problem: Clinical Measurements: Goal: Postoperative complications will be avoided or minimized Outcome: Progressing   Problem: Pain Management: Goal: Pain level will decrease with appropriate interventions Outcome: Progressing   Problem: Skin Integrity: Goal: Will show signs of wound healing Outcome: Progressing   Problem: Education: Goal: Knowledge of General Education information will improve Description: Including pain rating scale, medication(s)/side effects and non-pharmacologic comfort measures Outcome: Progressing   Problem: Health Behavior/Discharge Planning: Goal: Ability to manage health-related needs will improve Outcome: Progressing   Problem: Clinical Measurements: Goal: Ability to maintain clinical measurements within normal limits will improve Outcome: Progressing Goal: Will remain free from infection Outcome: Progressing Goal: Diagnostic test results will improve Outcome: Progressing Goal: Respiratory complications will improve Outcome: Progressing Goal: Cardiovascular complication will be avoided Outcome: Progressing   Problem: Activity: Goal: Risk for activity intolerance will decrease Outcome: Progressing   Problem: Nutrition: Goal: Adequate nutrition will be maintained Outcome: Progressing   Problem: Coping: Goal: Level of anxiety will decrease Outcome: Progressing   Problem: Elimination: Goal: Will not experience complications related to bowel motility Outcome: Progressing Goal: Will not experience complications related to urinary retention Outcome: Progressing   Problem: Pain  Managment: Goal: General experience of comfort will improve Outcome: Progressing   Problem: Safety: Goal: Ability to remain free from injury will improve Outcome: Progressing   Problem: Skin Integrity: Goal: Risk for impaired skin integrity will decrease Outcome: Progressing   

## 2022-03-09 NOTE — Anesthesia Preprocedure Evaluation (Signed)
Anesthesia Evaluation  Patient identified by MRN, date of birth, ID band Patient awake    Reviewed: Allergy & Precautions, H&P , NPO status , Patient's Chart, lab work & pertinent test results  Airway Mallampati: III  TM Distance: <3 FB Neck ROM: Full    Dental no notable dental hx.    Pulmonary neg pulmonary ROS, former smoker   Pulmonary exam normal breath sounds clear to auscultation       Cardiovascular hypertension, + CAD and + Peripheral Vascular Disease  Normal cardiovascular exam+ dysrhythmias Supra Ventricular Tachycardia  Rhythm:Regular Rate:Normal     Neuro/Psych negative neurological ROS  negative psych ROS   GI/Hepatic Neg liver ROS,GERD  Medicated,,  Endo/Other    Renal/GU negative Renal ROS  negative genitourinary   Musculoskeletal negative musculoskeletal ROS (+)    Abdominal   Peds negative pediatric ROS (+)  Hematology negative hematology ROS (+)   Anesthesia Other Findings   Reproductive/Obstetrics negative OB ROS                             Anesthesia Physical Anesthesia Plan  ASA: 3  Anesthesia Plan: Spinal   Post-op Pain Management: Regional block*   Induction: Intravenous  PONV Risk Score and Plan: 1 and Propofol infusion and Treatment may vary due to age or medical condition  Airway Management Planned: Simple Face Mask  Additional Equipment:   Intra-op Plan:   Post-operative Plan:   Informed Consent: I have reviewed the patients History and Physical, chart, labs and discussed the procedure including the risks, benefits and alternatives for the proposed anesthesia with the patient or authorized representative who has indicated his/her understanding and acceptance.     Dental advisory given  Plan Discussed with: CRNA and Surgeon  Anesthesia Plan Comments:        Anesthesia Quick Evaluation

## 2022-03-09 NOTE — Transfer of Care (Signed)
Immediate Anesthesia Transfer of Care Note  Patient: Bradley Novak  Procedure(s) Performed: TOTAL KNEE ARTHROPLASTY (Right: Knee)  Patient Location: PACU  Anesthesia Type:Spinal  Level of Consciousness: awake, alert , oriented, and patient cooperative  Airway & Oxygen Therapy: Patient Spontanous Breathing and Patient connected to face mask oxygen  Post-op Assessment: Report given to RN and Post -op Vital signs reviewed and stable  Post vital signs: Reviewed and stable  Last Vitals:  Vitals Value Taken Time  BP 112/69 03/09/22 0932  Temp    Pulse 62 03/09/22 0934  Resp 13 03/09/22 0934  SpO2 100 % 03/09/22 0934  Vitals shown include unvalidated device data.  Last Pain:  Vitals:   03/09/22 0602  TempSrc: Oral  PainSc:       Patients Stated Pain Goal: 5 ([id number])  Complications: No notable events documented.

## 2022-03-10 DIAGNOSIS — Z8571 Personal history of Hodgkin lymphoma: Secondary | ICD-10-CM | POA: Diagnosis not present

## 2022-03-10 DIAGNOSIS — M1711 Unilateral primary osteoarthritis, right knee: Secondary | ICD-10-CM | POA: Diagnosis not present

## 2022-03-10 DIAGNOSIS — I10 Essential (primary) hypertension: Secondary | ICD-10-CM | POA: Diagnosis not present

## 2022-03-10 DIAGNOSIS — Z96652 Presence of left artificial knee joint: Secondary | ICD-10-CM | POA: Diagnosis not present

## 2022-03-10 DIAGNOSIS — Z87891 Personal history of nicotine dependence: Secondary | ICD-10-CM | POA: Diagnosis not present

## 2022-03-10 DIAGNOSIS — Z79899 Other long term (current) drug therapy: Secondary | ICD-10-CM | POA: Diagnosis not present

## 2022-03-10 LAB — BASIC METABOLIC PANEL
Anion gap: 8 (ref 5–15)
BUN: 21 mg/dL (ref 8–23)
CO2: 24 mmol/L (ref 22–32)
Calcium: 8.6 mg/dL — ABNORMAL LOW (ref 8.9–10.3)
Chloride: 103 mmol/L (ref 98–111)
Creatinine, Ser: 0.91 mg/dL (ref 0.61–1.24)
GFR, Estimated: >60 mL/min (ref >60)
Glucose, Bld: 138 mg/dL — ABNORMAL HIGH (ref 70–99)
Potassium: 4 mmol/L (ref 3.5–5.1)
Sodium: 135 mmol/L (ref 135–145)

## 2022-03-10 LAB — CBC
HCT: 40.3 % (ref 39.0–52.0)
Hemoglobin: 13.3 g/dL (ref 13.0–17.0)
MCH: 29.3 pg (ref 26.0–34.0)
MCHC: 33 g/dL (ref 30.0–36.0)
MCV: 88.8 fL (ref 80.0–100.0)
Platelets: 230 10*3/uL (ref 150–400)
RBC: 4.54 MIL/uL (ref 4.22–5.81)
RDW: 12.2 % (ref 11.5–15.5)
WBC: 20.2 10*3/uL — ABNORMAL HIGH (ref 4.0–10.5)
nRBC: 0 % (ref 0.0–0.2)

## 2022-03-10 MED ORDER — METHOCARBAMOL 750 MG PO TABS: 750.0000 mg | ORAL_TABLET | Freq: Four times a day (QID) | ORAL | 1 refills | Status: DC | PRN

## 2022-03-10 MED ORDER — METHOCARBAMOL 500 MG PO TABS
750.0000 mg | ORAL_TABLET | Freq: Four times a day (QID) | ORAL | Status: DC | PRN
  Administered 2022-03-10 (×2): 750 mg via ORAL
  Filled 2022-03-10 (×2): qty 2

## 2022-03-10 NOTE — Progress Notes (Signed)
Orthopedics Progress Note  Subjective: Patient comfortable this AM. Therapy going well  Objective:  Vitals:   03/10/22 0155 03/10/22 0617  BP: (!) 143/80 133/88  Pulse: 73 (!) 102  Resp: 16 18  Temp: 97.8 F (36.6 C) 97.8 F (36.6 C)  SpO2: 99% 100%    General: Awake and alert  Musculoskeletal: Right knee dressing changed, wound looks good Neurovascularly intact  Lab Results  Component Value Date   WBC 20.2 (H) 03/10/2022   HGB 13.3 03/10/2022   HCT 40.3 03/10/2022   MCV 88.8 03/10/2022   PLT 230 03/10/2022       Component Value Date/Time   NA 135 03/10/2022 0416   NA 136 10/24/2021 1340   K 4.0 03/10/2022 0416   CL 103 03/10/2022 0416   CO2 24 03/10/2022 0416   GLUCOSE 138 (H) 03/10/2022 0416   BUN 21 03/10/2022 0416   BUN 15 10/24/2021 1340   CREATININE 0.91 03/10/2022 0416   CREATININE 1.34 (H) 11/14/2021 0923   CREATININE 0.83 09/27/2014 0001   CALCIUM 8.6 (L) 03/10/2022 0416   GFRNONAA >60 03/10/2022 0416   GFRNONAA 57 (L) 11/14/2021 0923   GFRAA 99 12/28/2019 1323   GFRAA >60 03/30/2019 1051    Lab Results  Component Value Date   INR 1.1 06/02/2018   INR 1.08 11/20/2017   INR 1.10 10/25/2017    Assessment/Plan: POD #1 s/p Procedure(s): TOTAL KNEE ARTHROPLASTY Stable overnight. Up with therapy Home with outpatient therapy scheduled.  DVT prophylaxis - ASA and TED hose  Doran Heater. Veverly Fells, MD 03/10/2022 6:39 AM

## 2022-03-10 NOTE — Progress Notes (Signed)
Physical Therapy Treatment Patient Details Name: Bradley Novak MRN: [id number] DOB: 20-Jul-1950 Today's Date: 03/10/2022   History of Present Illness 72 yo male presents to therapy s/p R TKA on 03/09/2022 due to failure of conservative measures. Pt PMH includes but is not limited to hodgkin's lymphoma s/p chemotherapy, HTN, HDL, SVT s/p ablation, and L TKA (07/2021).    PT Comments    Pt very cooperative and progressing with mobility but continues limited by pain and intermittent nausea.  Pt performed therex program, reviewed don/doff KI and up to ambulate limited distance in hall.  Pt hopeful for dc home this pm.   Recommendations for follow up therapy are one component of a multi-disciplinary discharge planning process, led by the attending physician.  Recommendations may be updated based on patient status, additional functional criteria and insurance authorization.  Follow Up Recommendations  Follow physician's recommendations for discharge plan and follow up therapies     Assistance Recommended at Discharge Frequent or constant Supervision/Assistance  Patient can return home with the following A little help with walking and/or transfers;A little help with bathing/dressing/bathroom;Assistance with cooking/housework;Assist for transportation;Help with stairs or ramp for entrance   Equipment Recommendations  None recommended by PT    Recommendations for Other Services       Precautions / Restrictions Precautions Precautions: Knee;Fall Restrictions Weight Bearing Restrictions: No RLE Weight Bearing: Weight bearing as tolerated     Mobility  Bed Mobility Overal bed mobility: Needs Assistance Bed Mobility: Supine to Sit     Supine to sit: Min guard     General bed mobility comments: Increased time with min guard to manage R LE    Transfers Overall transfer level: Needs assistance Equipment used: Rolling walker (2 wheels) Transfers: Sit to/from Stand Sit to Stand: Min  guard           General transfer comment: cues for LE management and use of UEs to self assist    Ambulation/Gait Ambulation/Gait assistance: Min guard Gait Distance (Feet): 85 Feet Assistive device: Rolling walker (2 wheels) Gait Pattern/deviations: Step-to pattern, Antalgic, Decreased step length - right, Decreased step length - left Gait velocity: decreased     General Gait Details: cues for sequence, posture and position from [employer]             Wheelchair Mobility    Modified Rankin (Stroke Patients Only)       Balance Overall balance assessment: Needs assistance Sitting-balance support: Feet supported, Single extremity supported Sitting balance-Leahy Scale: Good     Standing balance support: Single extremity supported Standing balance-Leahy Scale: Poor                              Cognition Arousal/Alertness: Awake/alert Behavior During Therapy: WFL for tasks assessed/performed Overall Cognitive Status: Within Functional Limits for tasks assessed                                          Exercises Total Joint Exercises Ankle Circles/Pumps: AROM, Both, 20 reps Quad Sets: AROM, Both, 10 reps, Supine Heel Slides: AAROM, Right, 15 reps, Supine Straight Leg Raises: AAROM, Right, 10 reps, Supine    General Comments        Pertinent Vitals/Pain Pain Assessment Pain Assessment: 0-10 Pain Score: 8  Pain Location: R knee Pain Descriptors / Indicators: Discomfort, Guarding,  Constant, Tender Pain Intervention(s): Limited activity within patient's tolerance, Monitored during session, Premedicated before session, Ice applied    Home Living                          Prior Function            PT Goals (current goals can now be found in the care plan section) Acute Rehab PT Goals Patient Stated Goal: Regain IND PT Goal Formulation: With patient Time For Goal Achievement: 03/23/22 Potential to Achieve  Goals: Good Progress towards PT goals: Progressing toward goals    Frequency    7X/week      PT Plan Current plan remains appropriate    Co-evaluation              AM-PAC PT "6 Clicks" Mobility   Outcome Measure  Help needed turning from your back to your side while in a flat bed without using bedrails?: A Little Help needed moving from lying on your back to sitting on the side of a flat bed without using bedrails?: A Little Help needed moving to and from a bed to a chair (including a wheelchair)?: A Little Help needed standing up from a chair using your arms (e.g., wheelchair or bedside chair)?: A Little Help needed to walk in hospital room?: A Little Help needed climbing 3-5 steps with a railing? : A Lot 6 Click Score: 17    End of Session Equipment Utilized During Treatment: Gait belt Activity Tolerance: Patient limited by pain;Other (comment) (and nausea) Patient left: in chair;with call bell/phone within reach;with chair alarm set Nurse Communication: Mobility status PT Visit Diagnosis: Difficulty in walking, not elsewhere classified (R26.2) Pain - Right/Left: Right Pain - part of body: Knee     Time: 0910-0952 PT Time Calculation (min) (ACUTE ONLY): 42 min  Charges:  $Gait Training: 8-22 mins $Therapeutic Exercise: 8-22 mins $Therapeutic Activity: 8-22 mins                     [place] Pager [phone] Office [phone]    Haleemah Buckalew 03/10/2022, 1:44 PM

## 2022-03-10 NOTE — Progress Notes (Signed)
Orthopedic Tech Progress Note Patient Details:  Bradley Novak April 17, 1950 [id number]  Checked in with pt at 1130 re: using the CPM today. Pt stated he was in between sessions with PT and would be going home later today therefore he did not want to use the CPM. I emphasized use of bone foam when not in the CPM and pt was understanding of its importance. Pt is seated comfortably in reclining chair at bedside, call bell is within reach.  Patient ID: Bradley Novak, male   DOB: [birth year], 72 y.o.   MRN: [id number]  [name] 03/10/2022, 12:08 PM

## 2022-03-10 NOTE — Discharge Summary (Signed)
In most cases prophylactic antibiotics for Dental procdeures after total joint surgery are not necessary.  Exceptions are as follows:  1. History of prior total joint infection  2. Severely immunocompromised (Organ Transplant, cancer chemotherapy, Rheumatoid biologic meds such as [place])  3. Poorly controlled diabetes (A1C &gt; 8.0, blood glucose over 200)  If you have one of these conditions, contact your surgeon for an antibiotic prescription, prior to your dental procedure. Orthopedic Discharge Summary        Physician Discharge Summary  Patient ID: Bradley Novak MRN: [id number] DOB/AGE: 1950/04/20 72 y.o.  Admit date: 03/09/2022 Discharge date: 03/10/2022   Procedures:  Procedure(s) (LRB): TOTAL KNEE ARTHROPLASTY (Right)  Attending Physician:  Dr. Esmond Plants  Admission Diagnoses:   Right knee end stage OA  Discharge Diagnoses:  same   Past Medical History:  Diagnosis Date   Arthritis    Contact lens/glasses fitting    Dysrhythmia    hx of SVT   GERD (gastroesophageal reflux disease)    History of chemotherapy    1 yr chemo completed 03/2018- in remission per patient   History of hiatal hernia    Hodgkin's lymphoma ([place]) 09/2017   Hyperlipidemia    Hypertension 2006   Obesity    SVT (supraventricular tachycardia)    Wears glasses     PCP: Carlena Hurl, PA-C   Discharged Condition: good  Hospital Course:  Patient underwent the above stated procedure on 03/09/2022. Patient tolerated the procedure well and brought to the recovery room in good condition and subsequently to the floor. Patient had an uncomplicated hospital course and was stable for discharge.   Disposition: Discharge disposition: 01-Home or Self Care      with follow up in 2 weeks    Follow-up Information     Netta Cedars, MD. Go on 03/22/2022.   Specialty: Orthopedic Surgery Why: You are scheduled for first post op appt on Thursday March 7 at 2:30pm. Contact  information: [address] [place] [address] [phone]                 Dental Antibiotics:  In most cases prophylactic antibiotics for Dental procdeures after total joint surgery are not necessary.  Exceptions are as follows:  1. History of prior total joint infection  2. Severely immunocompromised (Organ Transplant, cancer chemotherapy, Rheumatoid biologic meds such as [place])  3. Poorly controlled diabetes (A1C &gt; 8.0, blood glucose over 200)  If you have one of these conditions, contact your surgeon for an antibiotic prescription, prior to your dental procedure.  Discharge Instructions     Call MD / Call 911   Complete by: As directed    If you experience chest pain or shortness of breath, CALL 911 and be transported to the hospital emergency room.  If you develope a fever above 101 F, pus (white drainage) or increased drainage or redness at the wound, or calf pain, call your surgeon's office.   Constipation Prevention   Complete by: As directed    Drink plenty of fluids.  Prune juice may be helpful.  You may use a stool softener, such as Colace (over the counter) 100 mg twice a day.  Use MiraLax (over the counter) for constipation as needed.   Diet - low sodium heart healthy   Complete by: As directed    Increase activity slowly as tolerated   Complete by: As directed    Post-operative opioid taper instructions:   Complete by: As  directed    POST-OPERATIVE OPIOID TAPER INSTRUCTIONS: It is important to wean off of your opioid medication as soon as possible. If you do not need pain medication after your surgery it is ok to stop day one. Opioids include: Codeine, Hydrocodone(Norco, Vicodin), Oxycodone(Percocet, oxycontin) and hydromorphone amongst others.  Long term and even short term use of opiods can cause: Increased pain response Dependence Constipation Depression Respiratory depression And more.  Withdrawal symptoms can  include Flu like symptoms Nausea, vomiting And more Techniques to manage these symptoms Hydrate well Eat regular healthy meals Stay active Use relaxation techniques(deep breathing, meditating, yoga) Do Not substitute Alcohol to help with tapering If you have been on opioids for less than two weeks and do not have pain than it is ok to stop all together.  Plan to wean off of opioids This plan should start within one week post op of your joint replacement. Maintain the same interval or time between taking each dose and first decrease the dose.  Cut the total daily intake of opioids by one tablet each day Next start to increase the time between doses. The last dose that should be eliminated is the evening dose.          Allergies as of 03/10/2022   No Known Allergies      Medication List     TAKE these medications    acetaminophen 500 MG tablet Commonly known as: TYLENOL Take 1,000 mg by mouth as needed for moderate pain.   aspirin 81 MG chewable tablet Commonly known as: Aspirin Childrens Chew 1 tablet (81 mg total) by mouth 2 (two) times daily.   atorvastatin 20 MG tablet Commonly known as: LIPITOR TAKE ONE TABLET BY MOUTH ONCE DAILY What changed:  how much to take how to take this when to take this additional instructions   carvedilol 6.25 MG tablet Commonly known as: COREG TAKE ONE TABLET BY MOUTH TWICE DAILY   diltiazem 120 MG 24 hr capsule Commonly known as: Cardizem CD Take 1 capsule (120 mg total) by mouth daily as needed. What changed:  when to take this reasons to take this   diltiazem 30 MG tablet Commonly known as: Cardizem Take 1 tablet (30 mg total) by mouth 2 (two) times daily as needed (daily PRN for palpitations). What changed:  when to take this reasons to take this   esomeprazole 20 MG capsule Commonly known as: NEXIUM Take 20 mg by mouth daily.   finasteride 5 MG tablet Commonly known as: PROSCAR TAKE ONE TABLET BY MOUTH ONCE  DAILY   ibuprofen 200 MG tablet Commonly known as: ADVIL Take 800 mg by mouth as needed for moderate pain.   losartan 100 MG tablet Commonly known as: COZAAR TAKE ONE TABLET BY MOUTH ONCE DAILY   methocarbamol 750 MG tablet Commonly known as: ROBAXIN Take 1 tablet (750 mg total) by mouth every 6 (six) hours as needed for muscle spasms. What changed: when to take this   nitroGLYCERIN 0.3 MG SL tablet Commonly known as: Nitrostat Place 1 tablet (0.3 mg total) under the tongue every 5 (five) minutes as needed for chest pain.   oxyCODONE-acetaminophen 5-325 MG tablet Commonly known as: Percocet Take 1-2 tablets by mouth every 4 (four) hours as needed for severe pain.   tadalafil 5 MG tablet Commonly known as: CIALIS Take 5 mg by mouth daily as needed for erectile dysfunction.          Signed: Augustin Schooling 03/10/2022, 6:43 AM  [facility] now [employer] [facility] [address] [place], [place], [place] [address] Phone: [place]

## 2022-03-10 NOTE — Progress Notes (Signed)
Pt discharged to home. DC instructions given with wife at bedside. No concerns voiced. Reminded to stop by [facility] and pick up meds that were e-prescribed by Provider. Voiced understanding.  Left unit in whelchair pushed by Nurse tech. Left in stable condition.

## 2022-03-10 NOTE — Progress Notes (Signed)
Physical Therapy Treatment Patient Details Name: Bradley Novak MRN: [id number] DOB: [birth year] Today's Date: 03/10/2022   History of Present Illness 72 yo male presents to therapy s/p R TKA on 03/09/2022 due to failure of conservative measures. Pt PMH includes but is not limited to hodgkin's lymphoma s/p chemotherapy, HTN, HDL, SVT s/p ablation, and L TKA (07/2021).    PT Comments    Pt continues very cooperative  and progressing with mobility despite elevated pain level with activity.  Pt up to ambulate in hall, negotiated stairs, reviewed HEP and reviewed lower body dressing.  Pt eager for dc home this date.  Recommendations for follow up therapy are one component of a multi-disciplinary discharge planning process, led by the attending physician.  Recommendations may be updated based on patient status, additional functional criteria and insurance authorization.  Follow Up Recommendations  Follow physician's recommendations for discharge plan and follow up therapies     Assistance Recommended at Discharge Frequent or constant Supervision/Assistance  Patient can return home with the following A little help with walking and/or transfers;A little help with bathing/dressing/bathroom;Assistance with cooking/housework;Assist for transportation;Help with stairs or ramp for entrance   Equipment Recommendations  None recommended by PT    Recommendations for Other Services       Precautions / Restrictions Precautions Precautions: Knee;Fall Restrictions Weight Bearing Restrictions: No RLE Weight Bearing: Weight bearing as tolerated     Mobility  Bed Mobility Overal bed mobility: Needs Assistance Bed Mobility: Supine to Sit     Supine to sit: Min guard     General bed mobility comments: Pt up in chair and requests back to same    Transfers Overall transfer level: Needs assistance Equipment used: Rolling walker (2 wheels) Transfers: Sit to/from Stand Sit to Stand:  Supervision           General transfer comment: cues for LE management and use of UEs to self assist    Ambulation/Gait Ambulation/Gait assistance: Min guard, Supervision Gait Distance (Feet): 75 Feet Assistive device: Rolling walker (2 wheels) Gait Pattern/deviations: Step-to pattern, Antalgic, Decreased step length - right, Decreased step length - left Gait velocity: decreased     General Gait Details: min cues for sequence, posture and position from RW   Stairs Stairs: Yes Stairs assistance: Min assist Stair Management: No rails, Step to pattern, Backwards, With walker Number of Stairs: 3 General stair comments: single step 3x bkwd with cues for sequence and foot/RW placement   Wheelchair Mobility    Modified Rankin (Stroke Patients Only)       Balance Overall balance assessment: Needs assistance Sitting-balance support: Feet supported, Single extremity supported Sitting balance-Leahy Scale: Good     Standing balance support: No upper extremity supported Standing balance-Leahy Scale: Fair                              Cognition Arousal/Alertness: Awake/alert Behavior During Therapy: WFL for tasks assessed/performed Overall Cognitive Status: Within Functional Limits for tasks assessed                                          Exercises Total Joint Exercises Ankle Circles/Pumps: AROM, 10 reps, Supine, Both Quad Sets: AROM, Both, Supine, 5 reps Heel Slides: AAROM, Right, Supine, 5 reps Straight Leg Raises: AAROM, Right, 10 reps, Supine    General Comments  Pertinent Vitals/Pain Pain Assessment Pain Assessment: 0-10 Pain Score: 6  Pain Location: R knee Pain Descriptors / Indicators: Discomfort, Guarding, Constant, Tender Pain Intervention(s): Limited activity within patient's tolerance, Monitored during session, Premedicated before session, Ice applied    Home Living                          Prior  Function            PT Goals (current goals can now be found in the care plan section) Acute Rehab PT Goals Patient Stated Goal: Regain IND PT Goal Formulation: With patient Time For Goal Achievement: 03/23/22 Potential to Achieve Goals: Good Progress towards PT goals: Progressing toward goals    Frequency    7X/week      PT Plan Current plan remains appropriate    Co-evaluation              AM-PAC PT "6 Clicks" Mobility   Outcome Measure  Help needed turning from your back to your side while in a flat bed without using bedrails?: A Little Help needed moving from lying on your back to sitting on the side of a flat bed without using bedrails?: A Little Help needed moving to and from a bed to a chair (including a wheelchair)?: A Little Help needed standing up from a chair using your arms (e.g., wheelchair or bedside chair)?: A Little Help needed to walk in hospital room?: A Little Help needed climbing 3-5 steps with a railing? : A Little 6 Click Score: 18    End of Session Equipment Utilized During Treatment: Gait belt Activity Tolerance: Patient tolerated treatment well;Patient limited by pain Patient left: in chair;with call bell/phone within reach;with chair alarm set;with family/visitor present Nurse Communication: Mobility status PT Visit Diagnosis: Difficulty in walking, not elsewhere classified (R26.2) Pain - Right/Left: Right Pain - part of body: Knee     Time: 1352-1430 PT Time Calculation (min) (ACUTE ONLY): 38 min  Charges:  $Gait Training: 8-22 mins $Therapeutic Exercise: 8-22 mins $Therapeutic Activity: 8-22 mins                     Page Pager [phone] Office [phone]    Earlie Schank 03/10/2022, 4:22 PM

## 2022-03-12 ENCOUNTER — Encounter (HOSPITAL_COMMUNITY): Payer: Self-pay | Admitting: Orthopedic Surgery

## 2022-03-12 DIAGNOSIS — M25561 Pain in right knee: Secondary | ICD-10-CM | POA: Diagnosis not present

## 2022-03-14 ENCOUNTER — Telehealth: Payer: Self-pay | Admitting: Medical

## 2022-03-14 DIAGNOSIS — M25561 Pain in right knee: Secondary | ICD-10-CM | POA: Diagnosis not present

## 2022-03-14 NOTE — Telephone Encounter (Signed)
Called patient to schedule Medicare Annual Wellness Visit (AWV). Left message for patient to call back and schedule Medicare Annual Wellness Visit (AWV).  Last date of AWV:  due   awvi 12/15/17 per [facility]   Please schedule an appointment at any time with [facility].  If any questions, please contact me at [phone].  Thank you ,  Barkley Boards AWV direct phone # [phone]

## 2022-03-16 DIAGNOSIS — M25561 Pain in right knee: Secondary | ICD-10-CM | POA: Diagnosis not present

## 2022-03-19 DIAGNOSIS — M25561 Pain in right knee: Secondary | ICD-10-CM | POA: Diagnosis not present

## 2022-03-21 DIAGNOSIS — M25561 Pain in right knee: Secondary | ICD-10-CM | POA: Diagnosis not present

## 2022-03-22 DIAGNOSIS — Z4789 Encounter for other orthopedic aftercare: Secondary | ICD-10-CM | POA: Diagnosis not present

## 2022-03-23 DIAGNOSIS — M25561 Pain in right knee: Secondary | ICD-10-CM | POA: Diagnosis not present

## 2022-03-27 DIAGNOSIS — M25561 Pain in right knee: Secondary | ICD-10-CM | POA: Diagnosis not present

## 2022-03-29 DIAGNOSIS — M25561 Pain in right knee: Secondary | ICD-10-CM | POA: Diagnosis not present

## 2022-04-18 ENCOUNTER — Inpatient Hospital Stay (HOSPITAL_BASED_OUTPATIENT_CLINIC_OR_DEPARTMENT_OTHER): Payer: Medicare Other | Admitting: Family

## 2022-04-18 ENCOUNTER — Inpatient Hospital Stay: Payer: Medicare Other | Attending: Hematology & Oncology

## 2022-04-18 VITALS — BP 151/65 | HR 58 | Temp 97.8°F | Resp 17 | Ht 70.0 in | Wt 256.0 lb

## 2022-04-18 DIAGNOSIS — N6342 Unspecified lump in left breast, subareolar: Secondary | ICD-10-CM | POA: Diagnosis not present

## 2022-04-18 DIAGNOSIS — C8143 Lymphocyte-rich classical Hodgkin lymphoma, intra-abdominal lymph nodes: Secondary | ICD-10-CM | POA: Diagnosis not present

## 2022-04-18 DIAGNOSIS — M7989 Other specified soft tissue disorders: Secondary | ICD-10-CM | POA: Insufficient documentation

## 2022-04-18 DIAGNOSIS — Z79899 Other long term (current) drug therapy: Secondary | ICD-10-CM | POA: Insufficient documentation

## 2022-04-18 DIAGNOSIS — C187 Malignant neoplasm of sigmoid colon: Secondary | ICD-10-CM | POA: Insufficient documentation

## 2022-04-18 DIAGNOSIS — C8104 Nodular lymphocyte predominant Hodgkin lymphoma, lymph nodes of axilla and upper limb: Secondary | ICD-10-CM

## 2022-04-18 DIAGNOSIS — K635 Polyp of colon: Secondary | ICD-10-CM

## 2022-04-18 LAB — CBC WITH DIFFERENTIAL/PLATELET
Abs Immature Granulocytes: 0.02 10*3/uL (ref 0.00–0.07)
Basophils Absolute: 0.1 10*3/uL (ref 0.0–0.1)
Basophils Relative: 1 %
Eosinophils Absolute: 0.2 10*3/uL (ref 0.0–0.5)
Eosinophils Relative: 2 %
HCT: 37.2 % — ABNORMAL LOW (ref 39.0–52.0)
Hemoglobin: 12.1 g/dL — ABNORMAL LOW (ref 13.0–17.0)
Immature Granulocytes: 0 %
Lymphocytes Relative: 21 %
Lymphs Abs: 1.8 10*3/uL (ref 0.7–4.0)
MCH: 29.5 pg (ref 26.0–34.0)
MCHC: 32.5 g/dL (ref 30.0–36.0)
MCV: 90.7 fL (ref 80.0–100.0)
Monocytes Absolute: 0.5 10*3/uL (ref 0.1–1.0)
Monocytes Relative: 7 %
Neutro Abs: 5.8 10*3/uL (ref 1.7–7.7)
Neutrophils Relative %: 69 %
Platelets: 190 10*3/uL (ref 150–400)
RBC: 4.1 MIL/uL — ABNORMAL LOW (ref 4.22–5.81)
RDW: 13.1 % (ref 11.5–15.5)
WBC: 8.3 10*3/uL (ref 4.0–10.5)
nRBC: 0 % (ref 0.0–0.2)

## 2022-04-18 LAB — CMP (CANCER CENTER ONLY)
ALT: 11 U/L (ref 0–44)
AST: 17 U/L (ref 15–41)
Albumin: 3.6 g/dL (ref 3.5–5.0)
Alkaline Phosphatase: 65 U/L (ref 38–126)
Anion gap: 5 (ref 5–15)
BUN: 23 mg/dL (ref 8–23)
CO2: 26 mmol/L (ref 22–32)
Calcium: 8.5 mg/dL — ABNORMAL LOW (ref 8.9–10.3)
Chloride: 103 mmol/L (ref 98–111)
Creatinine: 1.07 mg/dL (ref 0.61–1.24)
GFR, Estimated: >60 mL/min (ref >60)
Glucose, Bld: 98 mg/dL (ref 70–99)
Potassium: 4.3 mmol/L (ref 3.5–5.1)
Sodium: 134 mmol/L — ABNORMAL LOW (ref 135–145)
Total Bilirubin: 0.4 mg/dL (ref 0.3–1.2)
Total Protein: 6.9 g/dL (ref 6.5–8.1)

## 2022-04-18 LAB — LACTATE DEHYDROGENASE: LDH: 111 U/L (ref 98–192)

## 2022-04-18 NOTE — Progress Notes (Signed)
Hematology and Oncology Follow Up Visit  Bradley Novak [id number] 28-Nov-1950 72 y.o. 04/18/2022   Principle Diagnosis:  Stage III Classical Hodgkin's Lymphoma Stage I (T1N0M0) adenocarcinoma of the sigmoid colon   Current Therapy:        S/P ABVD x 51/2 cycles -- completed in 03/2018 S/P resection of tumor in 05/2018   Interim History:  [name] is here today for follow-up. He requested his visit be moved due to new pain and fullness behind the left areola. There is firmness noted under the left areola that states is tender. No mass, lesion or rash noted. No redness or edema.  No adenopathy noted on  exam. No lymphedema in his arms.  He has had both knees replaced within the last year. His right knee was done in February 2024. He still has some mild swelling in his lower extremities. He has completed PT.  Once his right knee has healed and he gets clearance he will be having his annual colonoscopy.  No falls or syncope.  No fever, chills, n/v, cough, rash, dizziness, SOB, chest pain, palpitations, abdominal pain or changes in bowel or bladder habits.  No blood loss noted. No bruising or petechiae.  Appetite and hydration are good. Weight is stable at 256 lbs.   ECOG Performance Status: 1 - Symptomatic but completely ambulatory  Medications:  Allergies as of 04/18/2022   No Known Allergies      Medication List        Accurate as of April 18, 2022  2:09 PM. If you have any questions, ask your nurse or doctor.          acetaminophen 500 MG tablet Commonly known as: TYLENOL Take 1,000 mg by mouth as needed for moderate pain.   atorvastatin 20 MG tablet Commonly known as: LIPITOR TAKE ONE TABLET BY MOUTH ONCE DAILY What changed:  how much to take how to take this when to take this additional instructions   carvedilol 6.25 MG tablet Commonly known as: COREG TAKE ONE TABLET BY MOUTH TWICE DAILY   diltiazem 120 MG 24 hr capsule Commonly known as: Cardizem CD Take  1 capsule (120 mg total) by mouth daily as needed. What changed:  when to take this reasons to take this   diltiazem 30 MG tablet Commonly known as: Cardizem Take 1 tablet (30 mg total) by mouth 2 (two) times daily as needed (daily PRN for palpitations). What changed:  when to take this reasons to take this   esomeprazole 20 MG capsule Commonly known as: NEXIUM Take 20 mg by mouth daily.   finasteride 5 MG tablet Commonly known as: PROSCAR TAKE ONE TABLET BY MOUTH ONCE DAILY   ibuprofen 200 MG tablet Commonly known as: ADVIL Take 800 mg by mouth as needed for moderate pain.   losartan 100 MG tablet Commonly known as: COZAAR TAKE ONE TABLET BY MOUTH ONCE DAILY   methocarbamol 750 MG tablet Commonly known as: ROBAXIN Take 1 tablet (750 mg total) by mouth every 6 (six) hours as needed for muscle spasms.   nitroGLYCERIN 0.3 MG SL tablet Commonly known as: Nitrostat Place 1 tablet (0.3 mg total) under the tongue every 5 (five) minutes as needed for chest pain.   oxyCODONE-acetaminophen 5-325 MG tablet Commonly known as: Percocet Take 1-2 tablets by mouth every 4 (four) hours as needed for severe pain.   tadalafil 5 MG tablet Commonly known as: CIALIS Take 5 mg by mouth daily as needed for erectile dysfunction.  Allergies: No Known Allergies  Past Medical History, Surgical history, Social history, and Family History were reviewed and updated.  Review of Systems: All other 10 point review of systems is negative.   Physical Exam:  height is 5\' 10"  (1.778 m) and weight is 256 lb (116.1 kg). His oral temperature is 97.8 F (36.6 C). His blood pressure is 151/65 (abnormal) and his pulse is 58 (abnormal). His respiration is 17 and oxygen saturation is 99%.   Wt Readings from Last 3 Encounters:  04/18/22 256 lb (116.1 kg)  03/09/22 242 lb 8.1 oz (110 kg)  03/06/22 242 lb 8.1 oz (110 kg)    Ocular: Sclerae unicteric, pupils equal, round and reactive to  light Ear-nose-throat: Oropharynx clear, dentition fair Lymphatic: No cervical, supraclavicular or axillary adenopathy Lungs no rales or rhonchi, good excursion bilaterally Heart regular rate and rhythm, no murmur appreciated Abd soft, nontender, positive bowel sounds MSK no focal spinal tenderness, no joint edema Neuro: non-focal, well-oriented, appropriate affect Breasts: Same as above  Lab Results  Component Value Date   WBC 8.3 04/18/2022   HGB 12.1 (L) 04/18/2022   HCT 37.2 (L) 04/18/2022   MCV 90.7 04/18/2022   PLT 190 04/18/2022   Lab Results  Component Value Date   FERRITIN 148 12/22/2020   IRON 93 12/22/2020   TIBC 268 12/22/2020   UIBC 175 12/22/2020   IRONPCTSAT 35 12/22/2020   Lab Results  Component Value Date   RBC 4.10 (L) 04/18/2022   No results found for: "KPAFRELGTCHN", "LAMBDASER", "KAPLAMBRATIO" Lab Results  Component Value Date   IGGSERUM 1,118 11/02/2020   IGA 253 11/02/2020   IGMSERUM 44 11/02/2020   No results found for: "TOTALPROTELP", "ALBUMINELP", "A1GS", "A2GS", "BETS", "BETA2SER", "GAMS", "MSPIKE", "SPEI"   Chemistry      Component Value Date/Time   NA 135 03/10/2022 0416   NA 136 10/24/2021 1340   K 4.0 03/10/2022 0416   CL 103 03/10/2022 0416   CO2 24 03/10/2022 0416   BUN 21 03/10/2022 0416   BUN 15 10/24/2021 1340   CREATININE 0.91 03/10/2022 0416   CREATININE 1.34 (H) 11/14/2021 0923   CREATININE 0.83 09/27/2014 0001      Component Value Date/Time   CALCIUM 8.6 (L) 03/10/2022 0416   ALKPHOS 71 11/14/2021 0923   AST 15 11/14/2021 0923   ALT 15 11/14/2021 0923   BILITOT 0.4 11/14/2021 0923       Impression and Plan: [name] is a 72 yo Caucasian gentleman with history of both Hodgkin's disease and adenocarcinoma of the colon  His lymphoma was treated with chemotherapy and colon cancer with surgical resection. He is here today with pain and fullness in the left breast as mentioned above.  I discussed with Dr. Marin Olp and  we will move forward with a diagnostic mammogram.  Patient is in agreement with the plan.  Follow-up in 6 months subject to change if needed with mammogram result.   Lottie Dawson, NP 4/3/20242:09 PM

## 2022-05-07 ENCOUNTER — Other Ambulatory Visit: Payer: Self-pay | Admitting: Family

## 2022-05-07 ENCOUNTER — Telehealth: Payer: Self-pay | Admitting: *Deleted

## 2022-05-07 DIAGNOSIS — N6342 Unspecified lump in left breast, subareolar: Secondary | ICD-10-CM

## 2022-05-07 DIAGNOSIS — C8104 Nodular lymphocyte predominant Hodgkin lymphoma, lymph nodes of axilla and upper limb: Secondary | ICD-10-CM

## 2022-05-07 NOTE — Telephone Encounter (Signed)
Faxed referral to [facility] [phone])

## 2022-05-14 ENCOUNTER — Ambulatory Visit: Payer: Medicare Other | Admitting: Family

## 2022-05-14 ENCOUNTER — Other Ambulatory Visit: Payer: Medicare Other

## 2022-05-14 ENCOUNTER — Other Ambulatory Visit: Payer: Self-pay | Admitting: Family

## 2022-05-14 DIAGNOSIS — C8104 Nodular lymphocyte predominant Hodgkin lymphoma, lymph nodes of axilla and upper limb: Secondary | ICD-10-CM

## 2022-05-14 DIAGNOSIS — N6342 Unspecified lump in left breast, subareolar: Secondary | ICD-10-CM

## 2022-05-15 DIAGNOSIS — C8104 Nodular lymphocyte predominant Hodgkin lymphoma, lymph nodes of axilla and upper limb: Secondary | ICD-10-CM | POA: Diagnosis not present

## 2022-05-15 DIAGNOSIS — N6342 Unspecified lump in left breast, subareolar: Secondary | ICD-10-CM | POA: Diagnosis not present

## 2022-05-28 ENCOUNTER — Other Ambulatory Visit: Payer: Self-pay | Admitting: Internal Medicine

## 2022-05-28 MED ORDER — FINASTERIDE 5 MG PO TABS: 5.0000 mg | ORAL_TABLET | Freq: Every day | ORAL | 0 refills | Status: DC

## 2022-05-28 NOTE — Telephone Encounter (Signed)
From: Lynder Parents To: Office of [place], [place] Sent: 05/25/2022 7:07 PM EDT Subject: Medication Renewal Request  Refills have been requested for the following medications:   finasteride (PROSCAR) 5 MG tablet [Shane Tysinger]  Patient Comment: Please refill 90 tablets and 3 refills if possible. Thanks.   Preferred pharmacy: [facility] [phone] - [place], [place] - [address] Delivery method: Mail

## 2022-05-28 NOTE — Telephone Encounter (Signed)
done

## 2022-06-04 ENCOUNTER — Encounter: Payer: Self-pay | Admitting: Gastroenterology

## 2022-06-27 ENCOUNTER — Encounter: Payer: Self-pay | Admitting: Hematology

## 2022-07-06 ENCOUNTER — Ambulatory Visit (AMBULATORY_SURGERY_CENTER): Payer: Medicare Other

## 2022-07-06 VITALS — Ht 71.0 in | Wt 255.0 lb

## 2022-07-06 DIAGNOSIS — C801 Malignant (primary) neoplasm, unspecified: Secondary | ICD-10-CM

## 2022-07-06 DIAGNOSIS — Z86010 Personal history of colonic polyps: Secondary | ICD-10-CM

## 2022-07-06 NOTE — Progress Notes (Signed)
No egg or soy allergy known to patient  No issues known to pt with past sedation with any surgeries or procedures Patient denies ever being told they had issues or difficulty with intubation  No FH of Malignant Hyperthermia Pt is not on diet pills Pt is not on  home 02  Pt is not on blood thinners  Pt denies issues with constipation  No A fib or A flutter Have any cardiac testing pending--no Pt instructed to use Singlecare.com or GoodRx for a price reduction on prep  Patient's chart reviewed by Bradley Novak CNRA prior to previsit and patient appropriate for the [facility].  Previsit completed and red dot placed by patient's name on their procedure day (on provider's schedule).   Can ambulate independently 

## 2022-07-16 HISTORY — PX: COLONOSCOPY: SHX174

## 2022-07-26 ENCOUNTER — Encounter: Payer: Self-pay | Admitting: Gastroenterology

## 2022-07-26 ENCOUNTER — Ambulatory Visit (AMBULATORY_SURGERY_CENTER): Payer: Medicare Other | Admitting: Gastroenterology

## 2022-07-26 VITALS — BP 122/74 | HR 54 | Temp 97.8°F | Resp 16 | Ht 71.0 in | Wt 255.0 lb

## 2022-07-26 DIAGNOSIS — Z86010 Personal history of colonic polyps: Secondary | ICD-10-CM | POA: Diagnosis not present

## 2022-07-26 DIAGNOSIS — Z09 Encounter for follow-up examination after completed treatment for conditions other than malignant neoplasm: Secondary | ICD-10-CM | POA: Diagnosis not present

## 2022-07-26 DIAGNOSIS — C801 Malignant (primary) neoplasm, unspecified: Secondary | ICD-10-CM

## 2022-07-26 DIAGNOSIS — Z85038 Personal history of other malignant neoplasm of large intestine: Secondary | ICD-10-CM | POA: Diagnosis not present

## 2022-07-26 DIAGNOSIS — I10 Essential (primary) hypertension: Secondary | ICD-10-CM | POA: Diagnosis not present

## 2022-07-26 DIAGNOSIS — I2510 Atherosclerotic heart disease of native coronary artery without angina pectoris: Secondary | ICD-10-CM | POA: Diagnosis not present

## 2022-07-26 MED ORDER — SODIUM CHLORIDE 0.9 % IV SOLN: 500.0000 mL | Freq: Once | INTRAVENOUS | Status: DC

## 2022-07-26 NOTE — Progress Notes (Signed)
[facility] History and Physical   Primary Care Physician:  Jac Canavan, PA-C   Reason for Procedure:   History of malignant sigmoid polyp.  history of polyps  Plan:     colonoscopy     HPI: Bradley Novak is a 72 y.o. male    Past Medical History:  Diagnosis Date   Arthritis    Contact lens/glasses fitting    Dysrhythmia    hx of SVT   GERD (gastroesophageal reflux disease)    History of chemotherapy    1 yr chemo completed 03/2018- in remission per patient   History of hiatal hernia    Hodgkin's lymphoma (HCC) 09/2017   Hyperlipidemia    Hypertension 2006   Obesity    SVT (supraventricular tachycardia)    Wears glasses     Past Surgical History:  Procedure Laterality Date   CARDIAC CATHETERIZATION  1980s   COLONOSCOPY  05/20/2019   Dr.Jamile Rekowski   IR IMAGING GUIDED PORT INSERTION  11/20/2017   IR REMOVAL TUN ACCESS W/ PORT W/O FL MOD SED  06/02/2018   PORT-A-CATH REMOVAL     SIGMOIDOSCOPY  08/21/2019   Dr.Shadman Tozzi   SVT ABLATION  01/29/2018   SVT ABLATION N/A 01/29/2018   Procedure: SVT ABLATION;  Surgeon: Regan Lemming, MD;  Location: [facility];  Service: Cardiovascular;  Laterality: N/A;   TOTAL KNEE ARTHROPLASTY Left 08/11/2021   Procedure: TOTAL KNEE ARTHROPLASTY;  Surgeon: Beverely Low, MD;  Location: [facility];  Service: Orthopedics;  Laterality: Left;   TOTAL KNEE ARTHROPLASTY Right 03/09/2022   Procedure: TOTAL KNEE ARTHROPLASTY;  Surgeon: Beverely Low, MD;  Location: [facility];  Service: Orthopedics;  Laterality: Right;   WISDOM TOOTH EXTRACTION      Prior to Admission medications   Medication Sig Start Date End Date Taking? Authorizing Provider  atorvastatin (LIPITOR) 20 MG tablet TAKE 1 TABLET BY MOUTH DAILY 11/17/20  Yes Tysinger, Kermit Balo, PA-C  carvedilol (COREG) 6.25 MG tablet Take 1 tablet (6.25 mg total) by mouth 2 (two) times daily. 11/17/20  Yes Tysinger, Kermit Balo, PA-C  CRANBERRY PO Take 1 capsule by mouth daily.   Yes  [provider]  diltiazem (CARDIZEM CD) 120 MG 24 hr capsule Take 1 capsule (120 mg total) by mouth daily as needed. Patient taking differently: Take 120 mg by mouth as needed. 04/15/20  Yes Camnitz, Will Daphine Deutscher, MD  diltiazem (CARDIZEM) 30 MG tablet Take 1 tablet (30 mg total) by mouth 2 (two) times daily as needed (daily PRN for palpitations). 04/07/20  Yes Sheilah Pigeon, PA-C  esomeprazole (NEXIUM) 20 MG capsule Take 20 mg by mouth 2 (two) times daily.   Yes [provider]  finasteride (PROSCAR) 5 MG tablet TAKE ONE TABLET BY MOUTH ONCE DAILY 02/03/21  Yes Tysinger, Kermit Balo, PA-C  losartan (COZAAR) 100 MG tablet Take 1 tablet (100 mg total) by mouth daily. 11/17/20  Yes Tysinger, Kermit Balo, PA-C  tadalafil (CIALIS) 20 MG tablet TAKE ONE TABLET BY MOUTH DAILY AS NEEDED FOR ERECTILE DYSFUNCTION 11/17/20  Yes Tysinger, Kermit Balo, PA-C  nitroGLYCERIN (NITROSTAT) 0.3 MG SL tablet Place 1 tablet (0.3 mg total) under the tongue every 5 (five) minutes as needed for chest pain. 02/14/18   Tysinger, Kermit Balo, PA-C    Current Outpatient Medications  Medication Sig Dispense Refill   atorvastatin (LIPITOR) 20 MG tablet TAKE ONE TABLET BY MOUTH ONCE DAILY (Patient taking differently: Take 20 mg by mouth daily.) 90 tablet 0  carvedilol (COREG) 6.25 MG tablet TAKE ONE TABLET BY MOUTH TWICE DAILY 180 tablet 0   esomeprazole (NEXIUM) 20 MG capsule Take 20 mg by mouth daily.     finasteride (PROSCAR) 5 MG tablet Take 1 tablet (5 mg total) by mouth daily. 90 tablet 0   losartan (COZAAR) 100 MG tablet TAKE ONE TABLET BY MOUTH ONCE DAILY 90 tablet 0   acetaminophen (TYLENOL) 500 MG tablet Take 1,000 mg by mouth as needed for moderate pain.     diltiazem (CARDIZEM CD) 120 MG 24 hr capsule Take 1 capsule (120 mg total) by mouth daily as needed. (Patient not taking: Reported on 07/26/2022) 90 capsule 3   diltiazem (CARDIZEM) 30 MG tablet Take 1 tablet (30 mg total) by mouth 2 (two) times daily as needed  (daily PRN for palpitations). (Patient not taking: Reported on 07/06/2022) 90 tablet 1   ibuprofen (ADVIL) 200 MG tablet Take 800 mg by mouth as needed for moderate pain.     methocarbamol (ROBAXIN) 750 MG tablet Take 1 tablet (750 mg total) by mouth every 6 (six) hours as needed for muscle spasms. (Patient not taking: Reported on 07/06/2022) 60 tablet 1   nitroGLYCERIN (NITROSTAT) 0.3 MG SL tablet Place 1 tablet (0.3 mg total) under the tongue every 5 (five) minutes as needed for chest pain. 30 tablet 0   tadalafil (CIALIS) 5 MG tablet Take 5 mg by mouth daily as needed for erectile dysfunction.     Current Facility-Administered Medications  Medication Dose Route Frequency Provider Last Rate Last Admin   0.9 %  sodium chloride infusion  500 mL Intravenous Once Lynann Bologna, MD        Allergies as of 07/26/2022   (No Known Allergies)    Family History  Problem Relation Age of Onset   Hypertension Father    Hypertension Brother    Hypertension Brother    Hypertension Brother    Hypertension Brother    Colon cancer Neg Hx    Esophageal cancer Neg Hx    Rectal cancer Neg Hx    Stomach cancer Neg Hx    Colon polyps Neg Hx     Social History   Socioeconomic History   Marital status: Married    Spouse name: Not on file   Number of children: Not on file   Years of education: Not on file   Highest education level: Not on file  Occupational History   Not on file  Tobacco Use   Smoking status: Former    Current packs/day: 0.00    Average packs/day: 2.0 packs/day for 5.0 years (10.0 ttl pk-yrs)    Types: Cigarettes    Start date: 39    Quit date: 13    Years since quitting: 50.5   Smokeless tobacco: Never  Vaping Use   Vaping status: Never Used  Substance and Sexual Activity   Alcohol use: No   Drug use: Never   Sexual activity: Not Currently  Other Topics Concern   Not on file  Social History Narrative   Married, exercise - walks, [address] [place] [address] [place];  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, dabbles in a lot of different things, volunteers at food pantry   Social Determinants of Health   Financial Resource Strain: Not on file  Food Insecurity: No Food Insecurity (03/09/2022)   Hunger Vital Sign    Worried About Running Out of Food in the Last Year: Never true    Ran Out of Food  in the Last Year: Never true  Transportation Needs: No Transportation Needs (03/09/2022)   PRAPARE - Administrator, Civil Service (Medical): No    Lack of Transportation (Non-Medical): No  Physical Activity: Not on file  Stress: Not on file  Social Connections: Not on file  Intimate Partner Violence: Not At Risk (03/09/2022)   Humiliation, Afraid, Rape, and Kick questionnaire    Fear of Current or Ex-Partner: No    Emotionally Abused: No    Physically Abused: No    Sexually Abused: No    Review of Systems: Positive for  none All other review of systems negative except as mentioned in the HPI.  Physical Exam: Vital signs in last 24 hours: @VSRANGES @   General:   Alert,  Well-developed, well-nourished, pleasant and cooperative in NAD Lungs:  Clear throughout to auscultation.   Heart:  Regular rate and rhythm; no murmurs, clicks, rubs,  or gallops. Abdomen:  Soft, nontender and nondistended. Normal bowel sounds.   Neuro/Psych:  Alert and cooperative. Normal mood and affect. A and O x 3    No significant changes were identified.  The patient continues to be an appropriate candidate for the planned procedure and anesthesia.   Edman Circle, MD. [facility] 07/26/2022 9:20 AM@

## 2022-07-26 NOTE — Progress Notes (Signed)
VS by DT  Pt's states no medical or surgical changes since previsit or office visit.  

## 2022-07-26 NOTE — Progress Notes (Signed)
Report to PACU, RN, vss, BBS= Clear.  

## 2022-07-26 NOTE — Patient Instructions (Signed)
Resume previous diet and medications. Repeat Colonoscopy in 3 years for surveillance.  YOU HAD AN ENDOSCOPIC PROCEDURE TODAY AT THE [facility]:   Refer to the procedure report that was given to you for any specific questions about what was found during the examination.  If the procedure report does not answer your questions, please call your gastroenterologist to clarify.  If you requested that your care partner not be given the details of your procedure findings, then the procedure report has been included in a sealed envelope for you to review at your convenience later.  YOU SHOULD EXPECT: Some feelings of bloating in the abdomen. Passage of more gas than usual.  Walking can help get rid of the air that was put into your GI tract during the procedure and reduce the bloating. If you had a lower endoscopy (such as a colonoscopy or flexible sigmoidoscopy) you may notice spotting of blood in your stool or on the toilet paper. If you underwent a bowel prep for your procedure, you may not have a normal bowel movement for a few days.  Please Note:  You might notice some irritation and congestion in your nose or some drainage.  This is from the oxygen used during your procedure.  There is no need for concern and it should clear up in a day or so.  SYMPTOMS TO REPORT IMMEDIATELY:  Following lower endoscopy (colonoscopy or flexible sigmoidoscopy):  Excessive amounts of blood in the stool  Significant tenderness or worsening of abdominal pains  Swelling of the abdomen that is new, acute  Fever of 100F or higher   For urgent or emergent issues, a gastroenterologist can be reached at any hour by calling [phone]. Do not use MyChart messaging for urgent concerns.    DIET:  We do recommend a small meal at first, but then you may proceed to your regular diet.  Drink plenty of fluids but you should avoid alcoholic beverages for 24 hours.  ACTIVITY:  You should plan to take it easy for  the rest of today and you should NOT DRIVE or use heavy machinery until tomorrow (because of the sedation medicines used during the test).    FOLLOW UP: Our staff will call the number listed on your records the next business day following your procedure.  We will call around 7:15- 8:00 am to check on you and address any questions or concerns that you may have regarding the information given to you following your procedure. If we do not reach you, we will leave a message.     If any biopsies were taken you will be contacted by phone or by letter within the next 1-3 weeks.  Please call us at [phone] if you have not heard about the biopsies in 3 weeks.    SIGNATURES/CONFIDENTIALITY: You and/or your care partner have signed paperwork which will be entered into your electronic medical record.  These signatures attest to the fact that that the information above on your After Visit Summary has been reviewed and is understood.  Full responsibility of the confidentiality of this discharge information lies with you and/or your care-partner.

## 2022-07-26 NOTE — Op Note (Signed)
[facility] Patient Name: Bradley Novak Procedure Date: 07/26/2022 9:09 AM MRN: [id number] Endoscopist: Lynann Bologna , MD, [id number] Age: 72 Referring MD:  Date of Birth: Nov 13, 1950 Gender: Male Account #: [id number] Procedure:                Colonoscopy Indications:              High risk colon cancer surveillance: High risk                            colon cancer surveillance: H/O malignant sigmoid                            polyp s/p endoscopic resection 05/2019 (mod diff, no                            lymphovascular invasion, 1 mm margins). History of                            advanced colon polyps. Medicines:                Monitored Anesthesia Care Procedure:                Pre-Anesthesia Assessment:                           - Prior to the procedure, a History and Physical                            was performed, and patient medications and                            allergies were reviewed. The patient's tolerance of                            previous anesthesia was also reviewed. The risks                            and benefits of the procedure and the sedation                            options and risks were discussed with the patient.                            All questions were answered, and informed consent                            was obtained. Prior Anticoagulants: The patient has                            taken no anticoagulant or antiplatelet agents. ASA                            Grade Assessment: II - A patient with mild systemic  disease. After reviewing the risks and benefits,                            the patient was deemed in satisfactory condition to                            undergo the procedure.                           After obtaining informed consent, the colonoscope                            was passed under direct vision. Throughout the                            procedure, the patient's blood pressure,  pulse, and                            oxygen saturations were monitored continuously. The                            PCF-HQ190L Colonoscope [id number] was introduced                            through the anus and advanced to the 2 cm into the                            ileum. The colonoscopy was performed without                            difficulty. The patient tolerated the procedure                            well. The quality of the bowel preparation was                            good. The terminal ileum, ileocecal valve,                            appendiceal orifice, and rectum were photographed. Scope In: 9:21:59 AM Scope Out: 9:35:59 AM Scope Withdrawal Time: 0 hours 10 minutes 37 seconds  Total Procedure Duration: 0 hours 14 minutes 0 seconds  Findings:                 A few medium-mouthed diverticula were found in the                            sigmoid colon and rare ascending colon.                           A tattoo was seen in the sigmoid colon, 25 cm from                            the anal verge. The tattoo site appeared  normal.                            The area around tattoo was carefully examined. No                            recurrence.                           The terminal ileum appeared normal.                           Non-bleeding internal hemorrhoids were found during                            retroflexion. The hemorrhoids were small and Grade                            I (internal hemorrhoids that do not prolapse).                           The exam was otherwise without abnormality on                            direct and retroflexion views. Complications:            No immediate complications. Estimated Blood Loss:     Estimated blood loss: none. Impression:               - Mild colonic diverticulosis.                           - A tattoo was seen in the sigmoid colon. The                            tattoo site appeared normal. No recurrence.                            - The examined portion of the ileum was normal.                           - Non-bleeding internal hemorrhoids.                           - The examination was otherwise normal on direct                            and retroflexion views.                           - No specimens collected. Recommendation:           - Patient has a contact number available for                            emergencies. The signs and symptoms of potential  delayed complications were discussed with the                            patient. Return to normal activities tomorrow.                            Written discharge instructions were provided to the                            patient.                           - Resume previous diet.                           - Continue present medications.                           - Repeat colonoscopy in 3 years for surveillance.                           - The findings and recommendations were discussed                            with the patient's family. Lynann Bologna, MD 07/26/2022 9:44:55 AM This report has been signed electronically.

## 2022-07-27 ENCOUNTER — Telehealth: Payer: Self-pay | Admitting: *Deleted

## 2022-07-27 NOTE — Telephone Encounter (Signed)
  Follow up Call-     07/26/2022    8:54 AM 02/28/2021    8:23 AM  Call back number  Post procedure Call Back phone  # [phone]  Permission to leave phone message Yes Yes     Patient questions:   Message left to call us if necessary.

## 2022-08-06 ENCOUNTER — Other Ambulatory Visit: Payer: Self-pay | Admitting: Medical

## 2022-08-15 ENCOUNTER — Other Ambulatory Visit: Payer: Self-pay | Admitting: Medical

## 2022-08-15 MED ORDER — TADALAFIL 20 MG PO TABS: 20.0000 mg | ORAL_TABLET | Freq: Every day | ORAL | 0 refills | Status: DC | PRN

## 2022-10-10 ENCOUNTER — Telehealth: Payer: Medicare Other | Admitting: Medical

## 2022-10-10 ENCOUNTER — Encounter: Payer: Self-pay | Admitting: Medical

## 2022-10-10 VITALS — Wt 255.0 lb

## 2022-10-10 DIAGNOSIS — J988 Other specified respiratory disorders: Secondary | ICD-10-CM

## 2022-10-10 DIAGNOSIS — R350 Frequency of micturition: Secondary | ICD-10-CM | POA: Diagnosis not present

## 2022-10-10 DIAGNOSIS — R051 Acute cough: Secondary | ICD-10-CM

## 2022-10-10 DIAGNOSIS — N401 Enlarged prostate with lower urinary tract symptoms: Secondary | ICD-10-CM

## 2022-10-10 MED ORDER — BENZONATATE 200 MG PO CAPS: 200.0000 mg | ORAL_CAPSULE | Freq: Three times a day (TID) | ORAL | 0 refills | Status: DC | PRN

## 2022-10-10 MED ORDER — AZITHROMYCIN 250 MG PO TABS: ORAL_TABLET | ORAL | 0 refills | Status: DC

## 2022-10-10 NOTE — Progress Notes (Signed)
Subjective:     Patient ID: Bradley Novak, male   DOB: 22-Oct-1950, 72 y.o.   MRN: [id number]  This visit type was conducted due to national recommendations for restrictions regarding the COVID-19 Pandemic (e.g. social distancing) in an effort to limit this patient's exposure and mitigate transmission in our community.  Due to their co-morbid illnesses, this patient is at least at moderate risk for complications without adequate follow up.  This format is felt to be most appropriate for this patient at this time.    Documentation for virtual audio and video telecommunications through [facility] encounter:  The patient was located at home. The provider was located in the office. The patient did consent to this visit and is aware of possible charges through their insurance for this visit.  The other persons participating in this telemedicine service were none. Time spent on call was 20 minutes and in review of previous records 20 minutes total.  This virtual service is not related to other E/M service within previous 7 days.   HPI Chief Complaint  Patient presents with   Sore Throat    Sore throat, head cold, sinus pressure, ear pain sine Friday. Covid- negative   He reports 4 days of head cold, sinus pressure, sore throat, stuffy nose, cough.  No SOB, no wheezing.  Using OTC cold medicine robitussin DM and some cold and sinus medication from [facility].   Has done 2 covid tests and they were negative.  Last covid test negative yesterday.  Has some hoarse throat.  No fever, no body aches or chills.  Energy level a little low.   No NVD.  Getting up some yellow phlegm.   Using some tessalon perles 200mg  cough drops, and that has helped some.    Still has BPH problems, nocturia, despite Cialis 5mg  daily and proscar per urology.   Wants advice on this.    No other aggravating or relieving factors. No other complaint.   Past Medical History:  Diagnosis Date   Arthritis    Contact  lens/glasses fitting    Dysrhythmia    hx of SVT   GERD (gastroesophageal reflux disease)    History of chemotherapy    1 yr chemo completed 03/2018- in remission per patient   History of hiatal hernia    Hodgkin's lymphoma (HCC) 09/2017   Hyperlipidemia    Hypertension 2006   Obesity    SVT (supraventricular tachycardia)    Wears glasses    Current Outpatient Medications on File Prior to Visit  Medication Sig Dispense Refill   atorvastatin (LIPITOR) 20 MG tablet TAKE ONE TABLET BY MOUTH ONCE DAILY (Patient taking differently: Take 20 mg by mouth daily.) 90 tablet 0   carvedilol (COREG) 6.25 MG tablet TAKE ONE TABLET BY MOUTH TWICE DAILY 180 tablet 0   diltiazem (CARDIZEM CD) 120 MG 24 hr capsule Take 1 capsule (120 mg total) by mouth daily as needed. 90 capsule 3   diltiazem (CARDIZEM) 30 MG tablet Take 1 tablet (30 mg total) by mouth 2 (two) times daily as needed (daily PRN for palpitations). 90 tablet 1   esomeprazole (NEXIUM) 20 MG capsule Take 20 mg by mouth daily.     finasteride (PROSCAR) 5 MG tablet TAKE ONE TABLET BY MOUTH ONCE DAILY 90 tablet 0   losartan (COZAAR) 100 MG tablet TAKE ONE TABLET BY MOUTH ONCE DAILY 90 tablet 0   tadalafil (CIALIS) 20 MG tablet Take 1 tablet (20 mg total) by mouth daily as needed  for erectile dysfunction. 90 tablet 0   nitroGLYCERIN (NITROSTAT) 0.3 MG SL tablet Place 1 tablet (0.3 mg total) under the tongue every 5 (five) minutes as needed for chest pain. 30 tablet 0   No current facility-administered medications on file prior to visit.    Review of Systems As in subjective      Objective:   Physical Exam Due to coronavirus pandemic stay at home measures, patient visit was virtual and they were not examined in person.    Gen: wd, wn, nad No labored breathing or wheezing, hoarse voice      Assessment:     Encounter Diagnoses  Name Primary?   Respiratory tract infection Yes   Benign prostatic hyperplasia with urinary frequency     Acute cough        Plan:     Respiratory tract infection Advised rest, hydration, begin medicaiton below. If worse or not improving over the next few days, call or recheck.  Stop OTC sinus medication.  Alternate tessalon with Robitussin DM and not take together simulatenously.  BPH - already been on cialis 5mg  daily and proscar. Discussed other options.  F/u with urology  Bradley "Rudell Cobb" was seen today for sore throat.  Diagnoses and all orders for this visit:  Respiratory tract infection  Benign prostatic hyperplasia with urinary frequency  Acute cough  Other orders -     azithromycin (ZITHROMAX) 250 MG tablet; 2 tablets day 1, then 1 tablet days 2-4 -     benzonatate (TESSALON) 200 MG capsule; Take 1 capsule (200 mg total) by mouth 3 (three) times daily as needed for cough.  F/u prn

## 2022-10-11 ENCOUNTER — Encounter: Payer: Self-pay | Admitting: Medical

## 2022-10-11 NOTE — Progress Notes (Signed)
Letter typed & emailed to pt

## 2022-10-19 ENCOUNTER — Inpatient Hospital Stay: Payer: Medicare Other | Attending: Hematology & Oncology

## 2022-10-19 ENCOUNTER — Other Ambulatory Visit: Payer: Self-pay

## 2022-10-19 ENCOUNTER — Inpatient Hospital Stay (HOSPITAL_BASED_OUTPATIENT_CLINIC_OR_DEPARTMENT_OTHER): Payer: Medicare Other | Admitting: Medical Oncology

## 2022-10-19 ENCOUNTER — Encounter: Payer: Self-pay | Admitting: Medical Oncology

## 2022-10-19 VITALS — BP 121/74 | HR 144 | Temp 98.5°F | Resp 19 | Ht 71.0 in | Wt 259.0 lb

## 2022-10-19 DIAGNOSIS — K635 Polyp of colon: Secondary | ICD-10-CM | POA: Diagnosis not present

## 2022-10-19 DIAGNOSIS — I471 Supraventricular tachycardia, unspecified: Secondary | ICD-10-CM | POA: Insufficient documentation

## 2022-10-19 DIAGNOSIS — Z9221 Personal history of antineoplastic chemotherapy: Secondary | ICD-10-CM | POA: Diagnosis not present

## 2022-10-19 DIAGNOSIS — C8104 Nodular lymphocyte predominant Hodgkin lymphoma, lymph nodes of axilla and upper limb: Secondary | ICD-10-CM

## 2022-10-19 DIAGNOSIS — N6342 Unspecified lump in left breast, subareolar: Secondary | ICD-10-CM

## 2022-10-19 DIAGNOSIS — Z85038 Personal history of other malignant neoplasm of large intestine: Secondary | ICD-10-CM | POA: Diagnosis not present

## 2022-10-19 DIAGNOSIS — Z8571 Personal history of Hodgkin lymphoma: Secondary | ICD-10-CM | POA: Insufficient documentation

## 2022-10-19 LAB — CMP (CANCER CENTER ONLY)
ALT: 23 U/L (ref 0–44)
AST: 21 U/L (ref 15–41)
Albumin: 3.8 g/dL (ref 3.5–5.0)
Alkaline Phosphatase: 80 U/L (ref 38–126)
Anion gap: 9 (ref 5–15)
BUN: 17 mg/dL (ref 8–23)
CO2: 24 mmol/L (ref 22–32)
Calcium: 8.9 mg/dL (ref 8.9–10.3)
Chloride: 103 mmol/L (ref 98–111)
Creatinine: 1.13 mg/dL (ref 0.61–1.24)
GFR, Estimated: >60 mL/min (ref >60)
Glucose, Bld: 102 mg/dL — ABNORMAL HIGH (ref 70–99)
Potassium: 3.7 mmol/L (ref 3.5–5.1)
Sodium: 136 mmol/L (ref 135–145)
Total Bilirubin: 0.6 mg/dL (ref 0.3–1.2)
Total Protein: 7.1 g/dL (ref 6.5–8.1)

## 2022-10-19 LAB — CBC WITH DIFFERENTIAL (CANCER CENTER ONLY)
Abs Immature Granulocytes: 0.04 10*3/uL (ref 0.00–0.07)
Basophils Absolute: 0.1 10*3/uL (ref 0.0–0.1)
Basophils Relative: 1 %
Eosinophils Absolute: 0.2 10*3/uL (ref 0.0–0.5)
Eosinophils Relative: 1 %
HCT: 43.4 % (ref 39.0–52.0)
Hemoglobin: 14.7 g/dL (ref 13.0–17.0)
Immature Granulocytes: 0 %
Lymphocytes Relative: 22 %
Lymphs Abs: 2.4 10*3/uL (ref 0.7–4.0)
MCH: 30.2 pg (ref 26.0–34.0)
MCHC: 33.9 g/dL (ref 30.0–36.0)
MCV: 89.1 fL (ref 80.0–100.0)
Monocytes Absolute: 0.7 10*3/uL (ref 0.1–1.0)
Monocytes Relative: 7 %
Neutro Abs: 7.5 10*3/uL (ref 1.7–7.7)
Neutrophils Relative %: 69 %
Platelet Count: 208 10*3/uL (ref 150–400)
RBC: 4.87 MIL/uL (ref 4.22–5.81)
RDW: 12.4 % (ref 11.5–15.5)
WBC Count: 10.8 10*3/uL — ABNORMAL HIGH (ref 4.0–10.5)
nRBC: 0 % (ref 0.0–0.2)

## 2022-10-19 LAB — LACTATE DEHYDROGENASE: LDH: 123 U/L (ref 98–192)

## 2022-10-19 NOTE — Progress Notes (Signed)
Hematology and Oncology Follow Up Visit  Bradley Novak [id number] December 26, 1950 72 y.o. 10/19/2022   Principle Diagnosis:  Stage III Classical Hodgkin's Lymphoma Stage I (T1N0M0) adenocarcinoma of the sigmoid colon   Prior Therapy:        S/P ABVD x 51/2 cycles -- completed in 03/2018 S/P resection of tumor in 05/2018  Current Therapy: Surveillance   Interim History:  Bradley Novak is here today for follow-up.  Since his last visit he has been doing well. He is getting over a recent sinus infection. Other than this he has not had any recent illnesses.   At his last visit he was assessed for some breast fullness. He has imaging completed at [facility]- benign with no follow up recommended. Area of concern has resolved.   No unintentional weight loss, night sweats, lumps/bumps of concern  About 3 months ago he had his yearly colonoscopy which looked good- he has now graduated to every 3 years.   No falls or syncope.  No fever, chills, n/v, cough, rash, dizziness, SOB, chest pain, palpitations, abdominal pain or changes in bowel or bladder habits.  No blood loss noted. No bruising or petechiae.  Appetite and hydration are good.   Wt Readings from Last 3 Encounters:  10/19/22 259 lb 0.6 oz (117.5 kg)  10/10/22 255 lb (115.7 kg)  07/26/22 255 lb (115.7 kg)   ECOG Performance Status: 1 - Symptomatic but completely ambulatory  Medications:  Allergies as of 10/19/2022   No Known Allergies      Medication List        Accurate as of October 19, 2022  1:56 PM. If you have any questions, ask your nurse or doctor.          STOP taking these medications    azithromycin 250 MG tablet Commonly known as: ZITHROMAX Stopped by: Rushie Chestnut   benzonatate 200 MG capsule Commonly known as: TESSALON Stopped by: Rushie Chestnut       TAKE these medications    atorvastatin 20 MG tablet Commonly known as: LIPITOR TAKE ONE TABLET BY MOUTH ONCE DAILY What changed:  how  much to take how to take this when to take this additional instructions   carvedilol 6.25 MG tablet Commonly known as: COREG TAKE ONE TABLET BY MOUTH TWICE DAILY   diltiazem 120 MG 24 hr capsule Commonly known as: Cardizem CD Take 1 capsule (120 mg total) by mouth daily as needed.   diltiazem 30 MG tablet Commonly known as: Cardizem Take 1 tablet (30 mg total) by mouth 2 (two) times daily as needed (daily PRN for palpitations).   esomeprazole 20 MG capsule Commonly known as: NEXIUM Take 20 mg by mouth daily.   finasteride 5 MG tablet Commonly known as: PROSCAR TAKE ONE TABLET BY MOUTH ONCE DAILY   losartan 100 MG tablet Commonly known as: COZAAR TAKE ONE TABLET BY MOUTH ONCE DAILY   nitroGLYCERIN 0.3 MG SL tablet Commonly known as: Nitrostat Place 1 tablet (0.3 mg total) under the tongue every 5 (five) minutes as needed for chest pain.   tadalafil 20 MG tablet Commonly known as: Cialis Take 1 tablet (20 mg total) by mouth daily as needed for erectile dysfunction.        Allergies: No Known Allergies  Past Medical History, Surgical history, Social history, and Family History were reviewed and updated.  Review of Systems: All other 10 point review of systems is negative.   Physical Exam:  height is 5\' 11"  (  1.803 m) and weight is 259 lb 0.6 oz (117.5 kg). His oral temperature is 98.5 F (36.9 C). His blood pressure is 121/74 and his pulse is 144 (abnormal). His respiration is 19 and oxygen saturation is 97%.   Wt Readings from Last 3 Encounters:  10/19/22 259 lb 0.6 oz (117.5 kg)  10/10/22 255 lb (115.7 kg)  07/26/22 255 lb (115.7 kg)    Ocular: Sclerae unicteric, pupils equal, round and reactive to light Ear-nose-throat: Oropharynx clear, dentition fair Lymphatic: No cervical, supraclavicular or axillary adenopathy Lungs no rales or rhonchi, good excursion bilaterally Heart: Tachycardia with regular rhythm, no murmur appreciated Abd soft, nontender,  positive bowel sounds MSK no focal spinal tenderness, no joint edema Neuro: non-focal, well-oriented, appropriate affect  Lab Results  Component Value Date   WBC 10.8 (H) 10/19/2022   HGB 14.7 10/19/2022   HCT 43.4 10/19/2022   MCV 89.1 10/19/2022   PLT 208 10/19/2022   Lab Results  Component Value Date   FERRITIN 148 12/22/2020   IRON 93 12/22/2020   TIBC 268 12/22/2020   UIBC 175 12/22/2020   IRONPCTSAT 35 12/22/2020   Lab Results  Component Value Date   RBC 4.87 10/19/2022   No results found for: "KPAFRELGTCHN", "LAMBDASER", "[facility]" Lab Results  Component Value Date   IGGSERUM 1,118 11/02/2020   IGA 253 11/02/2020   IGMSERUM 44 11/02/2020   No results found for: "TOTALPROTELP", "ALBUMINELP", "A1GS", "A2GS", "BETS", "BETA2SER", "GAMS", "MSPIKE", "SPEI"   Chemistry      Component Value Date/Time   NA 136 10/19/2022 1302   NA 136 10/24/2021 1340   K 3.7 10/19/2022 1302   CL 103 10/19/2022 1302   CO2 24 10/19/2022 1302   BUN 17 10/19/2022 1302   BUN 15 10/24/2021 1340   CREATININE 1.13 10/19/2022 1302   CREATININE 0.83 09/27/2014 0001      Component Value Date/Time   CALCIUM 8.9 10/19/2022 1302   ALKPHOS 80 10/19/2022 1302   AST 21 10/19/2022 1302   ALT 23 10/19/2022 1302   BILITOT 0.6 10/19/2022 1302       Impression and Plan: Bradley Novak is a 72 yo Caucasian gentleman with history of both Hodgkin's disease and adenocarcinoma of the colon  His lymphoma was treated with chemotherapy and colon cancer with surgical resection.  Has SVT which is chronic intermittent. Has rescue medication at home. Feeling well. He will continue the advisement of his specialist in regards to his SVT.   On exam there is no sign of concern for recurrence. WBC is scantly elevated but he is just getting over an illness. Low level of concern but we did review red flags.   RTC 6 months MD, labs ( CBC w/, CMP, [facility]  Rushie Chestnut, PA-C 10/4/20241:56 PM

## 2022-10-23 ENCOUNTER — Ambulatory Visit (INDEPENDENT_AMBULATORY_CARE_PROVIDER_SITE_OTHER): Payer: Medicare Other | Admitting: Medical

## 2022-10-23 VITALS — BP 124/78 | HR 71 | Temp 97.6°F | Resp 16 | Wt 262.4 lb

## 2022-10-23 DIAGNOSIS — I2583 Coronary atherosclerosis due to lipid rich plaque: Secondary | ICD-10-CM | POA: Diagnosis not present

## 2022-10-23 DIAGNOSIS — R0982 Postnasal drip: Secondary | ICD-10-CM

## 2022-10-23 DIAGNOSIS — I700 Atherosclerosis of aorta: Secondary | ICD-10-CM | POA: Diagnosis not present

## 2022-10-23 DIAGNOSIS — R052 Subacute cough: Secondary | ICD-10-CM

## 2022-10-23 DIAGNOSIS — I2510 Atherosclerotic heart disease of native coronary artery without angina pectoris: Secondary | ICD-10-CM | POA: Diagnosis not present

## 2022-10-23 DIAGNOSIS — J988 Other specified respiratory disorders: Secondary | ICD-10-CM

## 2022-10-23 DIAGNOSIS — I10 Essential (primary) hypertension: Secondary | ICD-10-CM | POA: Diagnosis not present

## 2022-10-23 DIAGNOSIS — I471 Supraventricular tachycardia, unspecified: Secondary | ICD-10-CM | POA: Diagnosis not present

## 2022-10-23 MED ORDER — ATORVASTATIN CALCIUM 20 MG PO TABS: 20.0000 mg | ORAL_TABLET | Freq: Every day | ORAL | 0 refills | Status: DC

## 2022-10-23 MED ORDER — HYDROCODONE BIT-HOMATROP MBR 5-1.5 MG/5ML PO SOLN: 5.0000 mL | Freq: Three times a day (TID) | ORAL | 0 refills | Status: AC | PRN

## 2022-10-23 MED ORDER — LOSARTAN POTASSIUM 100 MG PO TABS: 100.0000 mg | ORAL_TABLET | Freq: Every day | ORAL | 0 refills | Status: DC

## 2022-10-23 MED ORDER — CARVEDILOL 6.25 MG PO TABS: 6.2500 mg | ORAL_TABLET | Freq: Two times a day (BID) | ORAL | 0 refills | Status: DC

## 2022-10-23 MED ORDER — FINASTERIDE 5 MG PO TABS: 5.0000 mg | ORAL_TABLET | Freq: Every day | ORAL | 0 refills | Status: DC

## 2022-10-23 MED ORDER — AMOXICILLIN-POT CLAVULANATE 875-125 MG PO TABS: 1.0000 | ORAL_TABLET | Freq: Two times a day (BID) | ORAL | 0 refills | Status: DC

## 2022-10-23 NOTE — Progress Notes (Signed)
Subjective:     Patient ID: Bradley Novak, male   DOB: [birth year], 72 y.o.   MRN: [id number]  Chief Complaint  Patient presents with   URI    Cough, congestion , chest congestion, no fever, no headache x 3 weeks   Medical Management of Chronic Issues    Would like refills on his medicatios   Here fro recheck.  We did a virtual consult 10/10/22.  At that time he had 4 days of head cold, sinus pressure, sore throat, stuffy nose, cough.  No SOB, no wheezing.  Was using OTC cold medicine robitussin DM and some cold and sinus medication from [facility].   Had some hoarse throat.  Ended up using zpak.   Wife had similar symptoms at the time and was also prescribed zpak. The zpak cleared his wife   Still has runny nose, coughing up phlegm, lots of cough.  Today is the best he has felt in last few days.   Wife had some Augmentin left over, and took some of that the last few days, and think it helped.    No sob, no fever, no chills, no diarrhea, no nausea.    No NVD.    Using some OTC mucinex DM, seems to help a little.   Tried tessalon Perles left over but that didn't help.   Needs refills as he is running out of medication.    No other aggravating or relieving factors. No other complaint.   Past Medical History:  Diagnosis Date   Arthritis    Contact lens/glasses fitting    Dysrhythmia    hx of SVT   GERD (gastroesophageal reflux disease)    History of chemotherapy    1 yr chemo completed 03/2018- in remission per patient   History of hiatal hernia    Hodgkin's lymphoma (HCC) 09/2017   Hyperlipidemia    Hypertension 2006   Obesity    SVT (supraventricular tachycardia) (HCC)    Wears glasses    Current Outpatient Medications on File Prior to Visit  Medication Sig Dispense Refill   diltiazem (CARDIZEM CD) 120 MG 24 hr capsule Take 1 capsule (120 mg total) by mouth daily as needed. 90 capsule 3   diltiazem (CARDIZEM) 30 MG tablet Take 1 tablet (30 mg total) by mouth 2 (two) times  daily as needed (daily PRN for palpitations). 90 tablet 1   esomeprazole (NEXIUM) 20 MG capsule Take 20 mg by mouth daily.     tadalafil (CIALIS) 20 MG tablet Take 1 tablet (20 mg total) by mouth daily as needed for erectile dysfunction. 90 tablet 0   nitroGLYCERIN (NITROSTAT) 0.3 MG SL tablet Place 1 tablet (0.3 mg total) under the tongue every 5 (five) minutes as needed for chest pain. (Patient not taking: Reported on 10/19/2022) 30 tablet 0   No current facility-administered medications on file prior to visit.    Review of Systems As in subjective      Objective:   Physical Exam BP 124/78   Pulse 71   Temp 97.6 F (36.4 C)   Resp 16   Wt 262 lb 6.4 oz (119 kg)   SpO2 97%   BMI 36.60 kg/m   Gen: wd, wn, nad, coughing a lot HEENT: normocephalic, sclerae anicteric, TMs pearly, nares patent, no discharge or erythema, pharynx with mild post nasal drainage Oral cavity: MMM, no lesions Neck: supple, no lymphadenopathy, no thyromegaly, no masses Heart: RRR, normal S1, S2, no murmurs Lungs: somewhat coarse sounds, otherwise  no wheezes, rhonchi, or rales Pulses: 2+ symmetric, upper and lower extremities, normal cap refill      Assessment:     Encounter Diagnoses  Name Primary?   Subacute cough Yes   Post-nasal drainage    Respiratory tract infection    Essential hypertension    SVT (supraventricular tachycardia) (HCC)    Atherosclerosis of aorta (HCC)    Coronary artery disease due to lipid rich plaque         Plan:     Discussed symptoms and concerns.  Antibiotic and cough syrup below along with Mucinex over-the-counter and good hydration to help clear up current symptoms.  Refilled medications for his chronic issues.  He is due for follow-up well visit.  He will get on the schedule for fasting labs and well visit soon.   Bradley "Rudell Cobb" was seen today for uri and medical management of chronic issues.  Diagnoses and all orders for this visit:  Subacute  cough  Post-nasal drainage  Respiratory tract infection  Essential hypertension  SVT (supraventricular tachycardia) (HCC)  Atherosclerosis of aorta (HCC)  Coronary artery disease due to lipid rich plaque  Other orders -     amoxicillin-clavulanate (AUGMENTIN) 875-125 MG tablet; Take 1 tablet by mouth 2 (two) times daily. -     HYDROcodone bit-homatropine (HYCODAN) 5-1.5 MG/5ML syrup; Take 5 mLs by mouth every 8 (eight) hours as needed for up to 5 days for cough. -     atorvastatin (LIPITOR) 20 MG tablet; Take 1 tablet (20 mg total) by mouth daily. -     losartan (COZAAR) 100 MG tablet; Take 1 tablet (100 mg total) by mouth daily. -     finasteride (PROSCAR) 5 MG tablet; Take 1 tablet (5 mg total) by mouth daily. -     carvedilol (COREG) 6.25 MG tablet; Take 1 tablet (6.25 mg total) by mouth 2 (two) times daily.   F/u soon for yearly well visit

## 2022-10-24 ENCOUNTER — Ambulatory Visit: Payer: Medicare Other | Admitting: Medical

## 2022-10-24 ENCOUNTER — Encounter: Payer: Self-pay | Admitting: Medical

## 2022-10-24 VITALS — BP 124/80 | Wt 262.0 lb

## 2022-10-24 DIAGNOSIS — I2510 Atherosclerotic heart disease of native coronary artery without angina pectoris: Secondary | ICD-10-CM

## 2022-10-24 DIAGNOSIS — Z9221 Personal history of antineoplastic chemotherapy: Secondary | ICD-10-CM

## 2022-10-24 DIAGNOSIS — D692 Other nonthrombocytopenic purpura: Secondary | ICD-10-CM

## 2022-10-24 DIAGNOSIS — K635 Polyp of colon: Secondary | ICD-10-CM

## 2022-10-24 DIAGNOSIS — I700 Atherosclerosis of aorta: Secondary | ICD-10-CM | POA: Diagnosis not present

## 2022-10-24 DIAGNOSIS — I471 Supraventricular tachycardia, unspecified: Secondary | ICD-10-CM | POA: Diagnosis not present

## 2022-10-24 DIAGNOSIS — L989 Disorder of the skin and subcutaneous tissue, unspecified: Secondary | ICD-10-CM | POA: Insufficient documentation

## 2022-10-24 DIAGNOSIS — I2583 Coronary atherosclerosis due to lipid rich plaque: Secondary | ICD-10-CM | POA: Diagnosis not present

## 2022-10-24 DIAGNOSIS — N400 Enlarged prostate without lower urinary tract symptoms: Secondary | ICD-10-CM | POA: Diagnosis not present

## 2022-10-24 DIAGNOSIS — R7301 Impaired fasting glucose: Secondary | ICD-10-CM | POA: Insufficient documentation

## 2022-10-24 DIAGNOSIS — Z8572 Personal history of non-Hodgkin lymphomas: Secondary | ICD-10-CM | POA: Diagnosis not present

## 2022-10-24 DIAGNOSIS — Z7185 Encounter for immunization safety counseling: Secondary | ICD-10-CM

## 2022-10-24 DIAGNOSIS — I10 Essential (primary) hypertension: Secondary | ICD-10-CM

## 2022-10-24 DIAGNOSIS — N529 Male erectile dysfunction, unspecified: Secondary | ICD-10-CM | POA: Diagnosis not present

## 2022-10-24 DIAGNOSIS — M170 Bilateral primary osteoarthritis of knee: Secondary | ICD-10-CM

## 2022-10-24 DIAGNOSIS — Z79899 Other long term (current) drug therapy: Secondary | ICD-10-CM

## 2022-10-24 LAB — POCT URINALYSIS DIP (PROADVANTAGE DEVICE)
Blood, UA: NEGATIVE
Glucose, UA: NEGATIVE mg/dL
Ketones, POC UA: NEGATIVE mg/dL
Leukocytes, UA: NEGATIVE
Nitrite, UA: NEGATIVE
Protein Ur, POC: NEGATIVE mg/dL
Specific Gravity, Urine: 1.025
Urobilinogen, Ur: NEGATIVE
pH, UA: 6 (ref 5.0–8.0)

## 2022-10-24 NOTE — Patient Instructions (Signed)
This visit was a preventative care visit, also known as wellness visit or routine physical.   Topics typically include healthy lifestyle, diet, exercise, preventative care, vaccinations, sick and well care, proper use of emergency dept and after hours care, as well as other concerns.     Recommendations: Continue to return yearly for your annual wellness and preventative care visits.  This gives [place] a chance to discuss healthy lifestyle, exercise, vaccinations, review your chart record, and perform screenings where appropriate.  I recommend you see your eye doctor yearly for routine vision care.  I recommend you see your dentist yearly for routine dental care including hygiene visits twice yearly.   Vaccination recommendations were reviewed Immunization History  Administered Date(s) Administered   Influenza-Unspecified 11/08/2020   PFIZER(Purple Top)SARS-COV-2 Vaccination 02/09/2019, 03/02/2019, 11/25/2019   Pneumococcal Conjugate-13 10/22/2018   Pneumococcal Polysaccharide-23 11/02/2019    Vaccine recommendations: Tetanus booster, Shingrix, influenza, COVID booster  Get your Tdap tetanus booster and shingles vaccine at the pharmacy and make sure they send [place] a copy of the information  Get you flu and covid boosters in the near future   Screening for cancer: Colon cancer screening: I reviewed your colonoscopy on file that is up to date from 2024.  Repeat in 3 years.  Prostate cancer screening is typically discontinued after age 67 per current guidelines  Skin cancer screening: Check your skin regularly for new changes, growing lesions, or other lesions of concern Come in for evaluation if you have skin lesions of concern.  Lung cancer screening: If you have a greater than 20 pack year history of tobacco use, then you may qualify for lung cancer screening with a chest CT scan.   Please call your insurance company to inquire about coverage for this test.  We currently don't have  screenings for other cancers besides breast, cervical, colon, and lung cancers.  If you have a strong family history of cancer or have other cancer screening concerns, please let me know.    Bone health: Get at least 150 minutes of aerobic exercise weekly Get weight bearing exercise at least once weekly Bone density test:  A bone density test is an imaging test that uses a type of X-ray to measure the amount of calcium and other minerals in your bones. The test may be used to diagnose or screen you for a condition that causes weak or thin bones (osteoporosis), predict your risk for a broken bone (fracture), or determine how well your osteoporosis treatment is working. The bone density test is recommended for females 65 and older, or females or males <65 if certain risk factors such as thyroid disease, long term use of steroids such as for asthma or rheumatological issues, vitamin D deficiency, estrogen deficiency, family history of osteoporosis, self or family history of fragility fracture in first degree relative.    Heart health: Get at least 150 minutes of aerobic exercise weekly Limit alcohol It is important to maintain a healthy blood pressure and healthy cholesterol numbers  Heart disease screening: Screening for heart disease includes screening for blood pressure, fasting lipids, glucose/diabetes screening, BMI height to weight ratio, reviewed of smoking status, physical activity, and diet.    Goals include blood pressure 120/80 or less, maintaining a healthy lipid/cholesterol profile, preventing diabetes or keeping diabetes numbers under good control, not smoking or using tobacco products, exercising most days per week or at least 150 minutes per week of exercise, and eating healthy variety of fruits and vegetables, healthy oils, and  avoiding unhealthy food choices like fried food, fast food, high sugar and high cholesterol foods.    Echocardiogram April 2022: 1. Left ventricular  ejection fraction, by estimation, is 55 to 60%. The left ventricle has normal function. The left ventricle has no regional wall motion abnormalities. There is mild left ventricular hypertrophy. Left ventricular diastolic parameters are indeterminate. 2. Right ventricular systolic function is normal. The right ventricular size is normal. Tricuspid regurgitation signal is inadequate for assessing PA pressure. 3. The mitral valve is normal in structure. No evidence of mitral valve regurgitation. No evidence of mitral stenosis. 4. The aortic valve is tricuspid. Aortic valve regurgitation is not visualized. Mild aortic valve sclerosis is present, with no evidence of aortic valve stenosis. 5. Aortic dilatation noted. There is mild dilatation of the ascending aorta, measuring 40 mm.   Medical care options: I recommend you continue to seek care here first for routine care.  We try really hard to have available appointments Monday through Friday daytime hours for sick visits, acute visits, and physicals.  Urgent care should be used for after hours and weekends for significant issues that cannot wait till the next day.  The emergency department should be used for significant potentially life-threatening emergencies.  The emergency department is expensive, can often have long wait times for less significant concerns, so try to utilize primary care, urgent care, or telemedicine when possible to avoid unnecessary trips to the emergency department.  Virtual visits and telemedicine have been introduced since the pandemic started in 2020, and can be convenient ways to receive medical care.  We offer virtual appointments as well to assist you in a variety of options to seek medical care.    Separate significant issues discussed: Impaired fasting glucose-updated hemoglobin A1c today.  Hypertension-blood pressures look good today, continue current medications Carvedilol 6.25mg  twice daily, losartan 100mg   daily  Coronary artery disease, hyperlipidemia, aortic atherosclerosis-continue statin Atorvastatin 20mg  daily  SVT - uses diltiazem prn for palpitations, and hasn't had to use recently  Erectile dysfunction - continue Cialis as needed, avoid use within 24 hours of nitroglycerin due to potentially life threatening drop in blood pressure  BPH - continue Finasteride 5mg  daily, PSA lab for surveillance today

## 2022-10-24 NOTE — Progress Notes (Signed)
Subjective:  Bradley Novak is a 72 y.o. male who presents for Chief Complaint  Patient presents with   Annual Exam    Fasting cpe, will get flu and covid shot for free at [facility]     Here for well visit  Patient Care Team: Amaryllis Malmquist, Kermit Balo, PA-C as PCP - General (Family Medicine) Nahser, Deloris Ping, MD as PCP - Cardiology (Cardiology) Regan Lemming, MD as PCP - Electrophysiology (Cardiology) Karie Soda, MD as Consulting Physician (General Surgery) Lynann Bologna, MD as Consulting Physician (Gastroenterology) Beverely Low, MD as Consulting Physician (Orthopedic Surgery) Josph Macho, MD as Consulting Physician (Oncology) Beverely Low, MD as Consulting Physician (Orthopedic Surgery) [facility]  Medical history includes history of Hodgkin's lymphoma, history of chemotherapy, coronary artery disease, atherosclerosis of aorta, hypertension, hyperlipidemia, impaired fasting glucose, GERD, erectile dysfunction, obesity, arthritis, SVT.  He reports compliance with medications.  No other aggravating or relieving factors.    No other c/o.  Past Medical History:  Diagnosis Date   Arthritis    Contact lens/glasses fitting    Dysrhythmia    hx of SVT   GERD (gastroesophageal reflux disease)    History of chemotherapy    1 yr chemo completed 03/2018- in remission per patient   History of hiatal hernia    Hodgkin's lymphoma (HCC) 09/2017   Hyperlipidemia    Hypertension 2006   Obesity    SVT (supraventricular tachycardia) (HCC)    Wears glasses    Current Outpatient Medications on File Prior to Visit  Medication Sig Dispense Refill   atorvastatin (LIPITOR) 20 MG tablet Take 1 tablet (20 mg total) by mouth daily. 90 tablet 0   carvedilol (COREG) 6.25 MG tablet Take 1 tablet (6.25 mg total) by mouth 2 (two) times daily. 180 tablet 0   diltiazem (CARDIZEM CD) 120 MG 24 hr capsule Take 1 capsule (120 mg total) by mouth daily as needed. 90 capsule 3   diltiazem  (CARDIZEM) 30 MG tablet Take 1 tablet (30 mg total) by mouth 2 (two) times daily as needed (daily PRN for palpitations). 90 tablet 1   esomeprazole (NEXIUM) 20 MG capsule Take 20 mg by mouth daily.     finasteride (PROSCAR) 5 MG tablet Take 1 tablet (5 mg total) by mouth daily. 90 tablet 0   HYDROcodone bit-homatropine (HYCODAN) 5-1.5 MG/5ML syrup Take 5 mLs by mouth every 8 (eight) hours as needed for up to 5 days for cough. 120 mL 0   losartan (COZAAR) 100 MG tablet Take 1 tablet (100 mg total) by mouth daily. 90 tablet 0   tadalafil (CIALIS) 20 MG tablet Take 1 tablet (20 mg total) by mouth daily as needed for erectile dysfunction. 90 tablet 0   amoxicillin-clavulanate (AUGMENTIN) 875-125 MG tablet Take 1 tablet by mouth 2 (two) times daily. (Patient not taking: Reported on 10/24/2022) 14 tablet 0   nitroGLYCERIN (NITROSTAT) 0.3 MG SL tablet Place 1 tablet (0.3 mg total) under the tongue every 5 (five) minutes as needed for chest pain. (Patient not taking: Reported on 10/19/2022) 30 tablet 0   No current facility-administered medications on file prior to visit.   Past Surgical History:  Procedure Laterality Date   CARDIAC CATHETERIZATION  1980s   COLONOSCOPY  05/20/2019   Dr.Gupta   COLONOSCOPY  07/2022   polyps, repeat 3 years   IR IMAGING GUIDED PORT INSERTION  11/20/2017   IR REMOVAL TUN ACCESS W/ PORT W/O FL MOD SED  06/02/2018   PORT-A-CATH REMOVAL  SIGMOIDOSCOPY  08/21/2019   Dr.Gupta   SVT ABLATION  01/29/2018   SVT ABLATION N/A 01/29/2018   Procedure: SVT ABLATION;  Surgeon: Regan Lemming, MD;  Location: [facility];  Service: Cardiovascular;  Laterality: N/A;   TOTAL KNEE ARTHROPLASTY Left 08/11/2021   Procedure: TOTAL KNEE ARTHROPLASTY;  Surgeon: Beverely Low, MD;  Location: [facility];  Service: Orthopedics;  Laterality: Left;   TOTAL KNEE ARTHROPLASTY Right 03/09/2022   Procedure: TOTAL KNEE ARTHROPLASTY;  Surgeon: Beverely Low, MD;  Location: [facility];   Service: Orthopedics;  Laterality: Right;   WISDOM TOOTH EXTRACTION     The following portions of the patient's history were reviewed and updated as appropriate: allergies, current medications, past family history, past medical history, past social history, past surgical history and problem list.  ROS Otherwise as in subjective above     Objective: BP 124/80   Wt 262 lb (118.8 kg)   BMI 36.54 kg/m   BP Readings from Last 3 Encounters:  10/24/22 124/80  10/23/22 124/78  10/19/22 121/74   Wt Readings from Last 3 Encounters:  10/24/22 262 lb (118.8 kg)  10/23/22 262 lb 6.4 oz (119 kg)  10/19/22 259 lb 0.6 oz (117.5 kg)   General appearance: alert, no distress, well developed, well nourished Left forearm posteriorly distal 1/3 with 4mm x 3mm pink somewhat thickened lesion, possible AK, no otherw worrisome lesions HEENT: normocephalic, sclerae anicteric, conjunctiva pink and moist, nares patent, no discharge or erythema, pharynx normal Oral cavity: MMM, no lesions Neck: supple, no lymphadenopathy, no thyromegaly, no masses, no JVD or bruits Heart: RRR, normal S1, S2, no murmurs Lungs: CTA bilaterally, no wheezes, rhonchi, or rales Abdomen: +bs, soft, non tender, non distended, no masses, no hepatomegaly, no splenomegaly Pulses: 2+ radial pulses, 2+ pedal pulses, normal cap refill Ext: no edema Neuro: CN2-12 intact, A&Ox3, nonfocal exam GU/rectal - declined/deferred    Assessment: Encounter Diagnoses  Name Primary?   Medication management Yes   Coronary artery disease due to lipid rich plaque    Atherosclerosis of aorta (HCC)    Arthritis of both knees    Erectile dysfunction, unspecified erectile dysfunction type    Essential hypertension    History of chemotherapy    History of lymphoma    Vaccine counseling    SVT (supraventricular tachycardia) (HCC)    Senile purpura (HCC)    Polyp of colon, unspecified part of colon, unspecified type    Morbid obesity (HCC)     Impaired fasting blood sugar    Benign prostatic hyperplasia, unspecified whether lower urinary tract symptoms present    Skin lesion       Plan: This visit was a preventative care visit, also known as wellness visit or routine physical.   Topics typically include healthy lifestyle, diet, exercise, preventative care, vaccinations, sick and well care, proper use of emergency dept and after hours care, as well as other concerns.     Recommendations: Continue to return yearly for your annual wellness and preventative care visits.  This gives [place] a chance to discuss healthy lifestyle, exercise, vaccinations, review your chart record, and perform screenings where appropriate.  I recommend you see your eye doctor yearly for routine vision care.  I recommend you see your dentist yearly for routine dental care including hygiene visits twice yearly.   Vaccination recommendations were reviewed Immunization History  Administered Date(s) Administered   Influenza-Unspecified 11/08/2020   PFIZER(Purple Top)SARS-COV-2 Vaccination 02/09/2019, 03/02/2019, 11/25/2019   Pneumococcal Conjugate-13 10/22/2018  Pneumococcal Polysaccharide-23 11/02/2019    Vaccine recommendations: Tetanus booster, Shingrix, influenza, COVID booster  Get your Tdap tetanus booster and shingles vaccine at the pharmacy and make sure they send [place] a copy of the information  Get you flu and covid boosters in the near future   Screening for cancer: Colon cancer screening: I reviewed your colonoscopy on file that is up to date from 2024.  Repeat in 3 years.  Prostate cancer screening is typically discontinued after age 41 per current guidelines  Skin cancer screening: Check your skin regularly for new changes, growing lesions, or other lesions of concern Come in for evaluation if you have skin lesions of concern.  Lung cancer screening: If you have a greater than 20 pack year history of tobacco use, then you may  qualify for lung cancer screening with a chest CT scan.   Please call your insurance company to inquire about coverage for this test.  We currently don't have screenings for other cancers besides breast, cervical, colon, and lung cancers.  If you have a strong family history of cancer or have other cancer screening concerns, please let me know.    Bone health: Get at least 150 minutes of aerobic exercise weekly Get weight bearing exercise at least once weekly Bone density test:  A bone density test is an imaging test that uses a type of X-ray to measure the amount of calcium and other minerals in your bones. The test may be used to diagnose or screen you for a condition that causes weak or thin bones (osteoporosis), predict your risk for a broken bone (fracture), or determine how well your osteoporosis treatment is working. The bone density test is recommended for females 65 and older, or females or males <65 if certain risk factors such as thyroid disease, long term use of steroids such as for asthma or rheumatological issues, vitamin D deficiency, estrogen deficiency, family history of osteoporosis, self or family history of fragility fracture in first degree relative.    Heart health: Get at least 150 minutes of aerobic exercise weekly Limit alcohol It is important to maintain a healthy blood pressure and healthy cholesterol numbers  Heart disease screening: Screening for heart disease includes screening for blood pressure, fasting lipids, glucose/diabetes screening, BMI height to weight ratio, reviewed of smoking status, physical activity, and diet.    Goals include blood pressure 120/80 or less, maintaining a healthy lipid/cholesterol profile, preventing diabetes or keeping diabetes numbers under good control, not smoking or using tobacco products, exercising most days per week or at least 150 minutes per week of exercise, and eating healthy variety of fruits and vegetables, healthy oils,  and avoiding unhealthy food choices like fried food, fast food, high sugar and high cholesterol foods.    Echocardiogram April 2022: 1. Left ventricular ejection fraction, by estimation, is 55 to 60%. The left ventricle has normal function. The left ventricle has no regional wall motion abnormalities. There is mild left ventricular hypertrophy. Left ventricular diastolic parameters are indeterminate. 2. Right ventricular systolic function is normal. The right ventricular size is normal. Tricuspid regurgitation signal is inadequate for assessing PA pressure. 3. The mitral valve is normal in structure. No evidence of mitral valve regurgitation. No evidence of mitral stenosis. 4. The aortic valve is tricuspid. Aortic valve regurgitation is not visualized. Mild aortic valve sclerosis is present, with no evidence of aortic valve stenosis. 5. Aortic dilatation noted. There is mild dilatation of the ascending aorta, measuring 40 mm.   Medical  care options: I recommend you continue to seek care here first for routine care.  We try really hard to have available appointments Monday through Friday daytime hours for sick visits, acute visits, and physicals.  Urgent care should be used for after hours and weekends for significant issues that cannot wait till the next day.  The emergency department should be used for significant potentially life-threatening emergencies.  The emergency department is expensive, can often have long wait times for less significant concerns, so try to utilize primary care, urgent care, or telemedicine when possible to avoid unnecessary trips to the emergency department.  Virtual visits and telemedicine have been introduced since the pandemic started in 2020, and can be convenient ways to receive medical care.  We offer virtual appointments as well to assist you in a variety of options to seek medical care.    Separate significant issues discussed: Impaired fasting glucose-updated  hemoglobin A1c today.  Hypertension-blood pressures look good today, continue current medications Carvedilol 6.25mg  twice daily, losartan 100mg  daily  Coronary artery disease, hyperlipidemia, aortic atherosclerosis-continue statin Atorvastatin 20mg  daily  SVT - uses diltiazem prn for palpitations, and hasn't had to use recently  Erectile dysfunction - continue Cialis as needed, avoid use within 24 hours of nitroglycerin due to potentially life threatening drop in blood pressure  BPH - continue Finasteride 5mg  daily, PSA lab for surveillance today  Skin lesion - see dermatology soon   [employer]" was seen today for annual exam.  Diagnoses and all orders for this visit:  Medication management -     POCT Urinalysis DIP (Proadvantage Device)  Coronary artery disease due to lipid rich plaque -     Lipid panel  Atherosclerosis of aorta (HCC) -     Lipid panel  Arthritis of both knees  Erectile dysfunction, unspecified erectile dysfunction type  Essential hypertension -     POCT Urinalysis DIP (Proadvantage Device)  History of chemotherapy  History of lymphoma  Vaccine counseling  SVT (supraventricular tachycardia) (HCC)  Senile purpura (HCC)  Polyp of colon, unspecified part of colon, unspecified type  Morbid obesity (HCC)  Impaired fasting blood sugar -     Hemoglobin A1c  Benign prostatic hyperplasia, unspecified whether lower urinary tract symptoms present -     PSA  Skin lesion   Spent > 45 minutes face to face with patient in discussion of symptoms, evaluation, plan and recommendations.    Follow up: pending labs.

## 2022-10-25 LAB — HEMOGLOBIN A1C
Est. average glucose Bld gHb Est-mCnc: 117 mg/dL
Hgb A1c MFr Bld: 5.7 % — ABNORMAL HIGH (ref 4.8–5.6)

## 2022-10-25 LAB — LIPID PANEL
Chol/HDL Ratio: 4.5 {ratio} (ref 0.0–5.0)
Cholesterol, Total: 159 mg/dL (ref 100–199)
HDL: 35 mg/dL — ABNORMAL LOW (ref >39)
LDL Chol Calc (NIH): 87 mg/dL (ref 0–99)
Triglycerides: 218 mg/dL — ABNORMAL HIGH (ref 0–149)
VLDL Cholesterol Cal: 37 mg/dL (ref 5–40)

## 2022-10-25 LAB — PSA: Prostate Specific Ag, Serum: 1.1 ng/mL (ref 0.0–4.0)

## 2022-10-25 NOTE — Progress Notes (Signed)
Results sent through MyChart

## 2022-11-06 DIAGNOSIS — Z23 Encounter for immunization: Secondary | ICD-10-CM | POA: Diagnosis not present

## 2022-12-11 ENCOUNTER — Other Ambulatory Visit: Payer: Self-pay | Admitting: Medical

## 2022-12-11 MED ORDER — AMOXICILLIN-POT CLAVULANATE 875-125 MG PO TABS: 1.0000 | ORAL_TABLET | Freq: Two times a day (BID) | ORAL | 0 refills | Status: DC

## 2022-12-24 ENCOUNTER — Other Ambulatory Visit: Payer: Self-pay | Admitting: Internal Medicine

## 2022-12-24 MED ORDER — CARVEDILOL 6.25 MG PO TABS: 6.2500 mg | ORAL_TABLET | Freq: Two times a day (BID) | ORAL | 1 refills | Status: DC

## 2022-12-24 MED ORDER — ATORVASTATIN CALCIUM 20 MG PO TABS: 20.0000 mg | ORAL_TABLET | Freq: Every day | ORAL | 1 refills | Status: DC

## 2022-12-24 MED ORDER — LOSARTAN POTASSIUM 100 MG PO TABS: 100.0000 mg | ORAL_TABLET | Freq: Every day | ORAL | 1 refills | Status: DC

## 2022-12-24 MED ORDER — FINASTERIDE 5 MG PO TABS: 5.0000 mg | ORAL_TABLET | Freq: Every day | ORAL | 1 refills | Status: DC

## 2022-12-25 ENCOUNTER — Other Ambulatory Visit: Payer: Self-pay | Admitting: Medical

## 2022-12-25 NOTE — Telephone Encounter (Signed)
4/5 meds were refilled yesterday. I will refill Cialis

## 2023-05-06 ENCOUNTER — Other Ambulatory Visit: Payer: Medicare Other

## 2023-05-06 ENCOUNTER — Ambulatory Visit: Payer: Medicare Other | Admitting: Medical Oncology

## 2023-05-13 ENCOUNTER — Other Ambulatory Visit: Payer: Medicare Other

## 2023-05-13 ENCOUNTER — Ambulatory Visit: Payer: Medicare Other | Admitting: Medical Oncology

## 2023-05-15 ENCOUNTER — Other Ambulatory Visit: Payer: Self-pay | Admitting: Medical

## 2023-05-15 ENCOUNTER — Encounter: Payer: Self-pay | Admitting: Medical Oncology

## 2023-05-15 ENCOUNTER — Other Ambulatory Visit: Payer: Self-pay | Admitting: Medical Oncology

## 2023-05-15 ENCOUNTER — Inpatient Hospital Stay (HOSPITAL_BASED_OUTPATIENT_CLINIC_OR_DEPARTMENT_OTHER): Admitting: Medical Oncology

## 2023-05-15 ENCOUNTER — Inpatient Hospital Stay: Attending: Hematology & Oncology

## 2023-05-15 VITALS — BP 145/73 | HR 62 | Temp 98.2°F | Resp 18 | Ht 71.0 in | Wt 263.4 lb

## 2023-05-15 DIAGNOSIS — C8104 Nodular lymphocyte predominant Hodgkin lymphoma, lymph nodes of axilla and upper limb: Secondary | ICD-10-CM

## 2023-05-15 DIAGNOSIS — K635 Polyp of colon: Secondary | ICD-10-CM | POA: Diagnosis not present

## 2023-05-15 DIAGNOSIS — Z9221 Personal history of antineoplastic chemotherapy: Secondary | ICD-10-CM | POA: Insufficient documentation

## 2023-05-15 DIAGNOSIS — N6342 Unspecified lump in left breast, subareolar: Secondary | ICD-10-CM | POA: Insufficient documentation

## 2023-05-15 DIAGNOSIS — Z8571 Personal history of Hodgkin lymphoma: Secondary | ICD-10-CM | POA: Insufficient documentation

## 2023-05-15 DIAGNOSIS — Z85038 Personal history of other malignant neoplasm of large intestine: Secondary | ICD-10-CM | POA: Insufficient documentation

## 2023-05-15 LAB — CMP (CANCER CENTER ONLY)
ALT: 14 U/L (ref 0–44)
AST: 13 U/L — ABNORMAL LOW (ref 15–41)
Albumin: 4 g/dL (ref 3.5–5.0)
Alkaline Phosphatase: 60 U/L (ref 38–126)
Anion gap: 9 (ref 5–15)
BUN: 22 mg/dL (ref 8–23)
CO2: 24 mmol/L (ref 22–32)
Calcium: 9 mg/dL (ref 8.9–10.3)
Chloride: 107 mmol/L (ref 98–111)
Creatinine: 1.11 mg/dL (ref 0.61–1.24)
GFR, Estimated: >60 mL/min (ref >60)
Glucose, Bld: 128 mg/dL — ABNORMAL HIGH (ref 70–99)
Potassium: 3.7 mmol/L (ref 3.5–5.1)
Sodium: 140 mmol/L (ref 135–145)
Total Bilirubin: 0.6 mg/dL (ref 0.0–1.2)
Total Protein: 6.8 g/dL (ref 6.5–8.1)

## 2023-05-15 LAB — CBC WITH DIFFERENTIAL (CANCER CENTER ONLY)
Abs Immature Granulocytes: 0.03 10*3/uL (ref 0.00–0.07)
Basophils Absolute: 0.1 10*3/uL (ref 0.0–0.1)
Basophils Relative: 1 %
Eosinophils Absolute: 0.2 10*3/uL (ref 0.0–0.5)
Eosinophils Relative: 2 %
HCT: 41.8 % (ref 39.0–52.0)
Hemoglobin: 14.1 g/dL (ref 13.0–17.0)
Immature Granulocytes: 0 %
Lymphocytes Relative: 20 %
Lymphs Abs: 1.9 10*3/uL (ref 0.7–4.0)
MCH: 30.3 pg (ref 26.0–34.0)
MCHC: 33.7 g/dL (ref 30.0–36.0)
MCV: 89.7 fL (ref 80.0–100.0)
Monocytes Absolute: 0.6 10*3/uL (ref 0.1–1.0)
Monocytes Relative: 6 %
Neutro Abs: 6.9 10*3/uL (ref 1.7–7.7)
Neutrophils Relative %: 71 %
Platelet Count: 195 10*3/uL (ref 150–400)
RBC: 4.66 MIL/uL (ref 4.22–5.81)
RDW: 12.7 % (ref 11.5–15.5)
WBC Count: 9.6 10*3/uL (ref 4.0–10.5)
nRBC: 0 % (ref 0.0–0.2)

## 2023-05-15 LAB — LACTATE DEHYDROGENASE: LDH: 113 U/L (ref 98–192)

## 2023-05-15 NOTE — Progress Notes (Signed)
 Hematology and Oncology Follow Up Visit  Bradley Novak [id number] 14-Jan-1951 73 y.o. 05/15/2023  Heme/Onc Overview: 1. Stage III classic Hodgkin lymphoma, subtype NOS due to limited specimen; currently NED  -Late 2019:  FDG-avid cervical, mediastinal and upper abdominal LN involement R cervical LN bx, Hodgkin lymphoma, insufficient tissue for subtype  -11/2017 - 03/2018: ABVD x 5 and a half cycles  CR after 2 cycles of ABVD; chemotherapy delayed due to SVT requiring ablation in 01/2018 Treatment discontinued early due to patient declining further treatment CR on end-of-treatment PET  -On surveillance  03/2019: NED on CT (1 year post-treatment)   2. Port placement in 11/2017   Principle Diagnosis:  Stage III Classical Hodgkin's Lymphoma Stage I (T1N0M0) adenocarcinoma of the sigmoid colon   Prior Therapy:        S/P ABVD x 51/2 cycles -- completed in 03/2018  Dr. Florina Novak > Dr. Gray Novak   (2019-04/02/2019) > (11/11/2019- Current) S/P resection of tumor in 05/2018  Dr. Lajuan Novak- [facility]   Current Therapy: Surveillance   Interim History:  [name] is here today for follow-up.  Today he reports that he has been doing well. He has no concerns.   He has not had any recent illnesses or infections. No lumps or bumps of concern.   In the past he was assessed for breast fullness. He has imaging completed at [facility]- benign with no follow up recommended. Area of concern has since resolved and has not returned.   No unintentional weight loss, night sweats  He now gets a colonoscopy every 3 years - his next is due in 2.5 years. He is seen and followed by Dr. [place] Novak   No falls or syncope.  No fever, chills, n/v, cough, rash, dizziness, SOB, chest pain, palpitations, abdominal pain or changes in bowel or bladder habits.  No blood loss noted. No bruising or petechiae.  Appetite and hydration are good.   Wt Readings from Last 3 Encounters:  05/15/23 263  lb 6.4 oz (119.5 kg)  10/24/22 262 lb (118.8 kg)  10/23/22 262 lb 6.4 oz (119 kg)   ECOG Performance Status: 1 - Symptomatic but completely ambulatory  Medications:  Allergies as of 05/15/2023   No Known Allergies      Medication List        Accurate as of May 15, 2023 12:26 PM. If you have any questions, ask your nurse or doctor.          STOP taking these medications    amoxicillin -clavulanate 875-125 MG tablet Commonly known as: AUGMENTIN  Stopped by: Bradley Novak       TAKE these medications    atorvastatin  20 MG tablet Commonly known as: LIPITOR Take 1 tablet (20 mg total) by mouth daily.   carvedilol  6.25 MG tablet Commonly known as: COREG  Take 1 tablet (6.25 mg total) by mouth 2 (two) times daily.   diltiazem  120 MG 24 hr capsule Commonly known as: Cardizem  CD Take 1 capsule (120 mg total) by mouth daily as needed.   diltiazem  30 MG tablet Commonly known as: Cardizem  Take 1 tablet (30 mg total) by mouth 2 (two) times daily as needed (daily PRN for palpitations).   esomeprazole  20 MG capsule Commonly known as: NEXIUM  Take 20 mg by mouth daily.   finasteride  5 MG tablet Commonly known as: PROSCAR  Take 1 tablet (5 mg total) by mouth daily.   losartan  100 MG tablet Commonly known as: COZAAR  Take 1 tablet (100 mg  total) by mouth daily.   nitroGLYCERIN  0.3 MG SL tablet Commonly known as: Nitrostat  Place 1 tablet (0.3 mg total) under the tongue every 5 (five) minutes as needed for chest pain.   tadalafil  20 MG tablet Commonly known as: CIALIS  TAKE ONE TABLET (20 MG TOTAL) BY MOUTH DAILY AS NEEDED FOR ERECTILE DYSFUNCTION        Allergies: No Known Allergies  Past Medical History, Surgical history, Social history, and Family History were reviewed and updated.  Review of Systems: All other 10 point review of systems is negative.   Physical Exam:  height is 5\' 11"  (1.803 m) and weight is 263 lb 6.4 oz (119.5 kg). His oral temperature  is 98.2 F (36.8 C). His blood pressure is 145/73 (abnormal) and his pulse is 62. His respiration is 18 and oxygen saturation is 100%.   Wt Readings from Last 3 Encounters:  05/15/23 263 lb 6.4 oz (119.5 kg)  10/24/22 262 lb (118.8 kg)  10/23/22 262 lb 6.4 oz (119 kg)    Ocular: Sclerae unicteric, pupils equal, round and reactive to light Ear-nose-throat: Oropharynx clear, dentition fair Lymphatic: No cervical, supraclavicular or axillary adenopathy Lungs no rales or rhonchi, good excursion bilaterally Heart: Tachycardia with regular rhythm, no murmur appreciated Abd soft, nontender, positive bowel sounds MSK no focal spinal tenderness, no joint edema Neuro: non-focal, well-oriented, appropriate affect  Lab Results  Component Value Date   WBC 9.6 05/15/2023   HGB 14.1 05/15/2023   HCT 41.8 05/15/2023   MCV 89.7 05/15/2023   PLT 195 05/15/2023   Lab Results  Component Value Date   FERRITIN 148 12/22/2020   IRON 93 12/22/2020   TIBC 268 12/22/2020   UIBC 175 12/22/2020   IRONPCTSAT 35 12/22/2020   Lab Results  Component Value Date   RBC 4.66 05/15/2023   No results found for: "KPAFRELGTCHN", "LAMBDASER", "KAPLAMBRATIO" Lab Results  Component Value Date   IGGSERUM 1,118 11/02/2020   IGA 253 11/02/2020   IGMSERUM 44 11/02/2020   No results found for: "TOTALPROTELP", "ALBUMINELP", "A1GS", "A2GS", "BETS", "BETA2SER", "GAMS", "MSPIKE", "SPEI"   Chemistry      Component Value Date/Time   NA 140 05/15/2023 1105   NA 136 10/24/2021 1340   K 3.7 05/15/2023 1105   CL 107 05/15/2023 1105   CO2 24 05/15/2023 1105   BUN 22 05/15/2023 1105   BUN 15 10/24/2021 1340   CREATININE 1.11 05/15/2023 1105   CREATININE 0.83 09/27/2014 0001      Component Value Date/Time   CALCIUM  9.0 05/15/2023 1105   ALKPHOS 60 05/15/2023 1105   AST 13 (L) 05/15/2023 1105   ALT 14 05/15/2023 1105   BILITOT 0.6 05/15/2023 1105     Encounter Diagnoses  Name Primary?   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 is a 73 yo Caucasian gentleman with history of both Hodgkin's disease and adenocarcinoma of the colon.  His lymphoma was treated with chemotherapy and colon cancer with surgical resection.  On exam there is no sign of concern for recurrence.  CBC is normal CMP and LDH pending  RTC 6 months MD, labs ( CBC w/, CMP, LDH)-[place] After this follow up he will graduate to once yearly   Bradley Davis, PA-C 4/30/202512:26 PM

## 2023-07-06 ENCOUNTER — Encounter: Payer: Self-pay | Admitting: Gastroenterology

## 2023-10-08 DIAGNOSIS — L570 Actinic keratosis: Secondary | ICD-10-CM | POA: Diagnosis not present

## 2023-10-08 DIAGNOSIS — D225 Melanocytic nevi of trunk: Secondary | ICD-10-CM | POA: Diagnosis not present

## 2023-10-08 DIAGNOSIS — D224 Melanocytic nevi of scalp and neck: Secondary | ICD-10-CM | POA: Diagnosis not present

## 2023-10-08 DIAGNOSIS — D2222 Melanocytic nevi of left ear and external auricular canal: Secondary | ICD-10-CM | POA: Diagnosis not present

## 2023-10-08 DIAGNOSIS — D485 Neoplasm of uncertain behavior of skin: Secondary | ICD-10-CM | POA: Diagnosis not present

## 2023-10-08 DIAGNOSIS — Z85828 Personal history of other malignant neoplasm of skin: Secondary | ICD-10-CM | POA: Diagnosis not present

## 2023-10-08 DIAGNOSIS — L905 Scar conditions and fibrosis of skin: Secondary | ICD-10-CM | POA: Diagnosis not present

## 2023-10-08 DIAGNOSIS — D2272 Melanocytic nevi of left lower limb, including hip: Secondary | ICD-10-CM | POA: Diagnosis not present

## 2023-10-08 DIAGNOSIS — D0462 Carcinoma in situ of skin of left upper limb, including shoulder: Secondary | ICD-10-CM | POA: Diagnosis not present

## 2023-10-08 DIAGNOSIS — C44619 Basal cell carcinoma of skin of left upper limb, including shoulder: Secondary | ICD-10-CM | POA: Diagnosis not present

## 2023-10-08 DIAGNOSIS — D2271 Melanocytic nevi of right lower limb, including hip: Secondary | ICD-10-CM | POA: Diagnosis not present

## 2023-10-08 DIAGNOSIS — C44519 Basal cell carcinoma of skin of other part of trunk: Secondary | ICD-10-CM | POA: Diagnosis not present

## 2023-10-15 DIAGNOSIS — Z23 Encounter for immunization: Secondary | ICD-10-CM | POA: Diagnosis not present

## 2023-11-14 ENCOUNTER — Inpatient Hospital Stay

## 2023-11-14 ENCOUNTER — Ambulatory Visit: Admitting: Hematology & Oncology

## 2023-11-28 ENCOUNTER — Ambulatory Visit: Admitting: Hematology & Oncology

## 2023-11-28 ENCOUNTER — Inpatient Hospital Stay

## 2023-12-04 ENCOUNTER — Ambulatory Visit (INDEPENDENT_AMBULATORY_CARE_PROVIDER_SITE_OTHER): Admitting: Medical

## 2023-12-04 VITALS — BP 112/66 | HR 123 | Ht 71.0 in | Wt 262.0 lb

## 2023-12-04 DIAGNOSIS — Z8572 Personal history of non-Hodgkin lymphomas: Secondary | ICD-10-CM

## 2023-12-04 DIAGNOSIS — I25119 Atherosclerotic heart disease of native coronary artery with unspecified angina pectoris: Secondary | ICD-10-CM

## 2023-12-04 DIAGNOSIS — I700 Atherosclerosis of aorta: Secondary | ICD-10-CM | POA: Diagnosis not present

## 2023-12-04 DIAGNOSIS — R7301 Impaired fasting glucose: Secondary | ICD-10-CM | POA: Diagnosis not present

## 2023-12-04 DIAGNOSIS — Z79899 Other long term (current) drug therapy: Secondary | ICD-10-CM | POA: Diagnosis not present

## 2023-12-04 DIAGNOSIS — R7303 Prediabetes: Secondary | ICD-10-CM | POA: Diagnosis not present

## 2023-12-04 DIAGNOSIS — I471 Supraventricular tachycardia, unspecified: Secondary | ICD-10-CM

## 2023-12-04 DIAGNOSIS — I10 Essential (primary) hypertension: Secondary | ICD-10-CM

## 2023-12-04 DIAGNOSIS — N529 Male erectile dysfunction, unspecified: Secondary | ICD-10-CM

## 2023-12-04 DIAGNOSIS — N401 Enlarged prostate with lower urinary tract symptoms: Secondary | ICD-10-CM

## 2023-12-04 MED ORDER — TAMSULOSIN HCL 0.4 MG PO CAPS: 0.4000 mg | ORAL_CAPSULE | Freq: Every day | ORAL | 1 refills | Status: AC

## 2023-12-04 MED ORDER — LOSARTAN POTASSIUM 100 MG PO TABS: 100.0000 mg | ORAL_TABLET | Freq: Every day | ORAL | 3 refills | Status: DC

## 2023-12-04 MED ORDER — CARVEDILOL 6.25 MG PO TABS: 6.2500 mg | ORAL_TABLET | Freq: Two times a day (BID) | ORAL | 3 refills | Status: AC

## 2023-12-04 MED ORDER — TADALAFIL 20 MG PO TABS: 20.0000 mg | ORAL_TABLET | Freq: Every day | ORAL | 0 refills | Status: AC | PRN

## 2023-12-04 MED ORDER — ATORVASTATIN CALCIUM 20 MG PO TABS: 20.0000 mg | ORAL_TABLET | Freq: Every day | ORAL | 3 refills | Status: AC

## 2023-12-04 MED ORDER — FINASTERIDE 5 MG PO TABS: 5.0000 mg | ORAL_TABLET | Freq: Every day | ORAL | 3 refills | Status: AC

## 2023-12-04 NOTE — Progress Notes (Signed)
 Subjective:  Bradley Novak is a 73 y.o. male who presents for Chief Complaint  Patient presents with   Medical Management of Chronic Issues    Fasting med check. Would like rx printed so he can mail them to [facility]     Here for well visit  Patient Care Team: Dorcas Melito, Alm RAMAN, PA-C as PCP - General (Family Medicine) Nahser, Aleene PARAS, MD (Inactive) as PCP - Cardiology (Cardiology) Inocencio Soyla Lunger, MD as PCP - Electrophysiology (Cardiology) Sheldon Standing, MD as Consulting Physician (General Surgery) Charlanne Groom, MD as Consulting Physician (Gastroenterology) Kay Kemps, MD as Consulting Physician (Orthopedic Surgery) Timmy Maude SAUNDERS, MD as Consulting Physician (Oncology) Kay Kemps, MD as Consulting Physician (Orthopedic Surgery) [facility] Urology prior, Dr. Arlyss Foot   Medical history includes history of Hodgkin's lymphoma, history of chemotherapy, coronary artery disease, atherosclerosis of aorta, hypertension, hyperlipidemia, impaired fasting glucose, GERD, erectile dysfunction, obesity, arthritis, SVT.  Bradley Novak is a 73 year old male who presents for a medication review and follow-up.  He has not taken diltiazem  in a couple of years, despite having prescriptions for both 30 mg and 120 mg doses. He primarily manages his blood pressure with losartan  100 mg daily and carvedilol  6.25 mg twice daily. He experiences episodes of supraventricular tachycardia, with heart rates rising to 140-150 bpm for an hour before returning to normal. He has not used nitroglycerin  but keeps it on hand for emergencies.  He has a history of supraventricular tachycardia and underwent an ablation, which was not fully successful. He monitors his heart rate at home using oximeters and notes that it can rise unexpectedly, but usually resolves within a day.  He takes finasteride  5 mg daily for prostate issues. He still experiences urgency and frequency,  particularly when traveling. He has not tried Flomax. He previously tried Cialis  5 mg daily for prostate and erectile dysfunction without significant improvement.  He takes Nexium  20 mg daily for gastroesophageal reflux disease and has not experienced any recent issues. He also uses Cialis  periodically, though he is uncertain of its effectiveness post-chemotherapy. His erectile function was affected after chemotherapy, but he has not noticed significant changes in urination with Cialis  use.  He has a history of chemotherapy, which he completed several years ago. His kidney function tests sometimes show 'dry kidney,' and he is advised to increase fluid intake.  Hyperlipidemia-compliant with atorvastatin  20 mg daily  He reports compliance with medications.  No other aggravating or relieving factors.    No other c/o.  Past Medical History:  Diagnosis Date   Arthritis    Contact lens/glasses fitting    Dysrhythmia    hx of SVT   GERD (gastroesophageal reflux disease)    History of chemotherapy    1 yr chemo completed 03/2018- in remission per patient   History of hiatal hernia    Hodgkin's lymphoma (HCC) 09/2017   Hyperlipidemia    Hypertension 2006   Obesity    SVT (supraventricular tachycardia)    Wears glasses    Current Outpatient Medications on File Prior to Visit  Medication Sig Dispense Refill   diltiazem  (CARDIZEM  CD) 120 MG 24 hr capsule Take 1 capsule (120 mg total) by mouth daily as needed. 90 capsule 3   diltiazem  (CARDIZEM ) 30 MG tablet Take 1 tablet (30 mg total) by mouth 2 (two) times daily as needed (daily PRN for palpitations). 90 tablet 1   esomeprazole  (NEXIUM ) 20 MG capsule Take 20 mg by mouth daily.  nitroGLYCERIN  (NITROSTAT ) 0.3 MG SL tablet Place 1 tablet (0.3 mg total) under the tongue every 5 (five) minutes as needed for chest pain. 30 tablet 0   No current facility-administered medications on file prior to visit.   Past Surgical History:  Procedure  Laterality Date   CARDIAC CATHETERIZATION  1980s   COLONOSCOPY  05/20/2019   Dr.Gupta   COLONOSCOPY  07/2022   polyps, repeat 3 years   IR IMAGING GUIDED PORT INSERTION  11/20/2017   IR REMOVAL TUN ACCESS W/ PORT W/O FL MOD SED  06/02/2018   PORT-A-CATH REMOVAL     SIGMOIDOSCOPY  08/21/2019   Dr.Gupta   SVT ABLATION  01/29/2018   SVT ABLATION N/A 01/29/2018   Procedure: SVT ABLATION;  Surgeon: Inocencio Soyla Lunger, MD;  Location: [facility];  Service: Cardiovascular;  Laterality: N/A;   TOTAL KNEE ARTHROPLASTY Left 08/11/2021   Procedure: TOTAL KNEE ARTHROPLASTY;  Surgeon: Kay Kemps, MD;  Location: [facility];  Service: Orthopedics;  Laterality: Left;   TOTAL KNEE ARTHROPLASTY Right 03/09/2022   Procedure: TOTAL KNEE ARTHROPLASTY;  Surgeon: Kay Kemps, MD;  Location: [facility];  Service: Orthopedics;  Laterality: Right;   WISDOM TOOTH EXTRACTION     The following portions of the patient's history were reviewed and updated as appropriate: allergies, current medications, past family history, past medical history, past social history, past surgical history and problem list.  ROS Otherwise as in subjective above     Objective: BP 112/66   Pulse (!) 123 Comment: recheck- 127  Ht 5' 11 (1.803 m)   Wt 262 lb (118.8 kg)   SpO2 98%   BMI 36.54 kg/m   BP Readings from Last 3 Encounters:  12/04/23 112/66  05/15/23 (!) 145/73  10/24/22 124/80   Wt Readings from Last 3 Encounters:  12/04/23 262 lb (118.8 kg)  05/15/23 263 lb 6.4 oz (119.5 kg)  10/24/22 262 lb (118.8 kg)   General appearance: alert, no distress, well developed, well nourished Neck: supple, no lymphadenopathy, no thyromegaly, no masses, no JVD or bruits Heart: RRR, normal S1, S2, no murmurs Lungs: CTA bilaterally, no wheezes, rhonchi, or rales Abdomen: +bs, soft, non tender, non distended, no masses, no hepatomegaly, no splenomegaly Pulses: 2+ radial pulses, 2+ pedal pulses, normal cap refill Ext: no  edema Neuro: CN2-12 intact, A&Ox3, nonfocal exam GU/rectal - declined/deferred    Assessment: Encounter Diagnoses  Name Primary?   Essential hypertension Yes   Impaired fasting blood sugar    Atherosclerosis of aorta    Benign prostatic hyperplasia with lower urinary tract symptoms, symptom details unspecified    Coronary artery disease with angina pectoris, unspecified vessel or lesion type, unspecified whether native or transplanted heart    Erectile dysfunction, unspecified erectile dysfunction type    History of lymphoma    Medication management    Morbid obesity (HCC)    SVT (supraventricular tachycardia)    Prediabetes      Plan: Essential hypertension Blood pressure controlled with losartan  and carvedilol . - Continue losartan  100 mg daily. - Continue carvedilol  6.25 mg twice daily.  Supraventricular tachycardia Intermittent SVT episodes, prefers to avoid further ablation unless symptoms worsen. - Monitor symptoms. - Consider follow up with electrophysiology if episodes become more frequent or symptomatic. -has diltiazem  at home for prn use  Benign prostatic hyperplasia with lower urinary tract symptoms Experiencing urgency, managing symptoms adequately, considering Flomax  or surgery if symptoms worsen. - Consider starting Flomax  if symptoms worsen. - Discuss surgical options with urology  if symptoms worsen. -already on finasteride  and failed cialis  5mg  daily use  Atherosclerotic heart disease with angina No recent angina episodes, nitroglycerin  not used recently. - Continue current management.  Morbid obesity with HTN Discussed weight loss study participation using GLP-1 agonists. - Referred to [employer] for potential participation in weight loss study.  Impaired fasting glucose No diabetes diagnosis, discussed weight loss benefits on glucose levels. -labs today  Male erectile dysfunction Using Cialis  with variable effectiveness. - Continue current use  of Cialis  as needed.   Recommendations: Continue to return yearly for your annual wellness and preventative care visits.  This gives us  a chance to discuss healthy lifestyle, exercise, vaccinations, review your chart record, and perform screenings where appropriate.  I recommend you see your eye doctor yearly for routine vision care.  I recommend you see your dentist yearly for routine dental care including hygiene visits twice yearly.   Vaccination recommendations were reviewed Immunization History  Administered Date(s) Administered   Fluad Trivalent(High Dose 65+) 11/06/2022   INFLUENZA, HIGH DOSE SEASONAL PF 10/16/2023   Influenza-Unspecified 11/08/2020   PFIZER(Purple Top)SARS-COV-2 Vaccination 02/09/2019, 03/02/2019, 11/25/2019   Pneumococcal Conjugate-13 10/22/2018   Pneumococcal Polysaccharide-23 11/02/2019    Vaccine recommendations: Tetanus booster, Shingrix, COVID booster, pneumococcal  Get your Tdap tetanus booster and shingles vaccine at the pharmacy and make sure they send us  a copy of the information  Sign for copy of records from [facility]   Screening for cancer: Colon cancer screening: I reviewed your colonoscopy on file that is up to date from 2024.  Repeat in 3 years.  Prostate cancer screening is typically discontinued after age 63 per current guidelines  Skin cancer screening: Check your skin regularly for new changes, growing lesions, or other lesions of concern Come in for evaluation if you have skin lesions of concern.  Lung cancer screening: If you have a greater than 20 pack year history of tobacco use, then you may qualify for lung cancer screening with a chest CT scan.   Please call your insurance company to inquire about coverage for this test.  We currently don't have screenings for other cancers besides breast, cervical, colon, and lung cancers.  If you have a strong family history of cancer or have other cancer screening concerns, please let  me know.     Heart health: Get at least 150 minutes of aerobic exercise weekly Limit alcohol It is important to maintain a healthy blood pressure and healthy cholesterol numbers  Heart disease screening: Screening for heart disease includes screening for blood pressure, fasting lipids, glucose/diabetes screening, BMI height to weight ratio, reviewed of smoking status, physical activity, and diet.    Goals include blood pressure 120/80 or less, maintaining a healthy lipid/cholesterol profile, preventing diabetes or keeping diabetes numbers under good control, not smoking or using tobacco products, exercising most days per week or at least 150 minutes per week of exercise, and eating healthy variety of fruits and vegetables, healthy oils, and avoiding unhealthy food choices like fried food, fast food, high sugar and high cholesterol foods.    Echocardiogram April 2022: 1. Left ventricular ejection fraction, by estimation, is 55 to 60%. The left ventricle has normal function. The left ventricle has no regional wall motion abnormalities. There is mild left ventricular hypertrophy. Left ventricular diastolic parameters are indeterminate. 2. Right ventricular systolic function is normal. The right ventricular size is normal. Tricuspid regurgitation signal is inadequate for assessing PA pressure. 3. The mitral valve is normal in structure. No  evidence of mitral valve regurgitation. No evidence of mitral stenosis. 4. The aortic valve is tricuspid. Aortic valve regurgitation is not visualized. Mild aortic valve sclerosis is present, with no evidence of aortic valve stenosis. 5. Aortic dilatation noted. There is mild dilatation of the ascending aorta, measuring 40 mm.  Follow up with cardiology as planned   Medical care options: I recommend you continue to seek care here first for routine care.  We try really hard to have available appointments Monday through Friday daytime hours for sick visits,  acute visits, and physicals.  Urgent care should be used for after hours and weekends for significant issues that cannot wait till the next day.  The emergency department should be used for significant potentially life-threatening emergencies.  The emergency department is expensive, can often have long wait times for less significant concerns, so try to utilize primary care, urgent care, or telemedicine when possible to avoid unnecessary trips to the emergency department.  Virtual visits and telemedicine have been introduced since the pandemic started in 2020, and can be convenient ways to receive medical care.  We offer virtual appointments as well to assist you in a variety of options to seek medical care.      Bradley Novak was seen today for medical management of chronic issues.  Diagnoses and all orders for this visit:  Essential hypertension -     CBC -     Comprehensive metabolic panel with GFR -     TSH -     Urinalysis, Routine w reflex microscopic  Impaired fasting blood sugar  Atherosclerosis of aorta -     Lipid panel -     Hemoglobin A1c  Benign prostatic hyperplasia with lower urinary tract symptoms, symptom details unspecified -     PSA  Coronary artery disease with angina pectoris, unspecified vessel or lesion type, unspecified whether native or transplanted heart -     Lipid panel  Erectile dysfunction, unspecified erectile dysfunction type  History of lymphoma  Medication management  Morbid obesity (HCC) -     TSH  SVT (supraventricular tachycardia) -     TSH -     Magnesium   Prediabetes -     Hemoglobin A1c  Other orders -     tamsulosin  (FLOMAX ) 0.4 MG CAPS capsule; Take 1 capsule (0.4 mg total) by mouth daily. -     finasteride  (PROSCAR ) 5 MG tablet; Take 1 tablet (5 mg total) by mouth daily. -     tadalafil  (CIALIS ) 20 MG tablet; Take 1 tablet (20 mg total) by mouth daily as needed for erectile dysfunction. -     carvedilol  (COREG ) 6.25 MG  tablet; Take 1 tablet (6.25 mg total) by mouth 2 (two) times daily. -     losartan  (COZAAR ) 100 MG tablet; Take 1 tablet (100 mg total) by mouth daily. -     atorvastatin  (LIPITOR) 20 MG tablet; Take 1 tablet (20 mg total) by mouth daily.   Spent > 45 minutes face to face with patient in discussion of symptoms, evaluation, plan and recommendations.    Follow up: pending labs.

## 2023-12-05 ENCOUNTER — Ambulatory Visit: Payer: Self-pay | Admitting: Medical

## 2023-12-05 LAB — URINALYSIS, ROUTINE W REFLEX MICROSCOPIC
Bilirubin, UA: NEGATIVE
Glucose, UA: NEGATIVE
Ketones, UA: NEGATIVE
Leukocytes,UA: NEGATIVE
Nitrite, UA: NEGATIVE
Protein,UA: NEGATIVE
RBC, UA: NEGATIVE
Specific Gravity, UA: 1.022 (ref 1.005–1.030)
Urobilinogen, Ur: 0.2 mg/dL (ref 0.2–1.0)
pH, UA: 5.5 (ref 5.0–7.5)

## 2023-12-05 LAB — COMPREHENSIVE METABOLIC PANEL WITH GFR
ALT: 12 IU/L (ref 0–44)
AST: 15 IU/L (ref 0–40)
Albumin: 4.1 g/dL (ref 3.8–4.8)
Alkaline Phosphatase: 75 IU/L (ref 47–123)
BUN/Creatinine Ratio: 18 (ref 10–24)
BUN: 19 mg/dL (ref 8–27)
Bilirubin Total: 0.8 mg/dL (ref 0.0–1.2)
CO2: 19 mmol/L — ABNORMAL LOW (ref 20–29)
Calcium: 9.4 mg/dL (ref 8.6–10.2)
Chloride: 104 mmol/L (ref 96–106)
Creatinine, Ser: 1.07 mg/dL (ref 0.76–1.27)
Globulin, Total: 2.7 g/dL (ref 1.5–4.5)
Glucose: 111 mg/dL — ABNORMAL HIGH (ref 70–99)
Potassium: 4.2 mmol/L (ref 3.5–5.2)
Sodium: 138 mmol/L (ref 134–144)
Total Protein: 6.8 g/dL (ref 6.0–8.5)
eGFR: 74 mL/min/1.73 (ref >59)

## 2023-12-05 LAB — HEMOGLOBIN A1C
Est. average glucose Bld gHb Est-mCnc: 114 mg/dL
Hgb A1c MFr Bld: 5.6 % (ref 4.8–5.6)

## 2023-12-05 LAB — LIPID PANEL
Chol/HDL Ratio: 3.3 ratio (ref 0.0–5.0)
Cholesterol, Total: 143 mg/dL (ref 100–199)
HDL: 44 mg/dL (ref >39)
LDL Chol Calc (NIH): 75 mg/dL (ref 0–99)
Triglycerides: 134 mg/dL (ref 0–149)
VLDL Cholesterol Cal: 24 mg/dL (ref 5–40)

## 2023-12-05 LAB — PSA: Prostate Specific Ag, Serum: 1.3 ng/mL (ref 0.0–4.0)

## 2023-12-05 LAB — CBC
Hematocrit: 44.7 % (ref 37.5–51.0)
Hemoglobin: 15.1 g/dL (ref 13.0–17.7)
MCH: 30.2 pg (ref 26.6–33.0)
MCHC: 33.8 g/dL (ref 31.5–35.7)
MCV: 89 fL (ref 79–97)
Platelets: 199 x10E3/uL (ref 150–450)
RBC: 5 x10E6/uL (ref 4.14–5.80)
RDW: 12.4 % (ref 11.6–15.4)
WBC: 9 x10E3/uL (ref 3.4–10.8)

## 2023-12-05 LAB — TSH: TSH: 2.68 u[IU]/mL (ref 0.450–4.500)

## 2023-12-05 LAB — MAGNESIUM: Magnesium: 2.1 mg/dL (ref 1.6–2.3)

## 2023-12-05 NOTE — Progress Notes (Signed)
 Sabrina-please refer to [employer] weight loss study  Results through [employer] -can you please get him on the schedule for annual wellness in 4 months

## 2024-01-21 ENCOUNTER — Ambulatory Visit: Payer: Self-pay | Admitting: Internal Medicine

## 2024-01-25 ENCOUNTER — Other Ambulatory Visit: Payer: Self-pay | Admitting: Medical

## 2024-06-09 ENCOUNTER — Ambulatory Visit: Admitting: Family Medicine
